# Patient Record
Sex: Female | Born: 1935
Health system: Southern US, Community
[De-identification: ages and names within clinical notes are randomized; demographics above are authoritative.]

## PROBLEM LIST (undated history)

## (undated) DIAGNOSIS — D649 Anemia, unspecified: Secondary | ICD-10-CM

## (undated) DIAGNOSIS — I739 Peripheral vascular disease, unspecified: Secondary | ICD-10-CM

## (undated) DIAGNOSIS — E785 Hyperlipidemia, unspecified: Secondary | ICD-10-CM

## (undated) DIAGNOSIS — I2510 Atherosclerotic heart disease of native coronary artery without angina pectoris: Secondary | ICD-10-CM

## (undated) DIAGNOSIS — R413 Other amnesia: Secondary | ICD-10-CM

## (undated) DIAGNOSIS — K219 Gastro-esophageal reflux disease without esophagitis: Secondary | ICD-10-CM

## (undated) DIAGNOSIS — E119 Type 2 diabetes mellitus without complications: Secondary | ICD-10-CM

## (undated) DIAGNOSIS — K5790 Diverticulosis of intestine, part unspecified, without perforation or abscess without bleeding: Secondary | ICD-10-CM

## (undated) DIAGNOSIS — Z531 Procedure and treatment not carried out because of patient's decision for reasons of belief and group pressure: Secondary | ICD-10-CM

## (undated) DIAGNOSIS — I5032 Chronic diastolic (congestive) heart failure: Secondary | ICD-10-CM

## (undated) DIAGNOSIS — E039 Hypothyroidism, unspecified: Secondary | ICD-10-CM

## (undated) DIAGNOSIS — I10 Essential (primary) hypertension: Secondary | ICD-10-CM

## (undated) DIAGNOSIS — K635 Polyp of colon: Secondary | ICD-10-CM

## (undated) DIAGNOSIS — Z8719 Personal history of other diseases of the digestive system: Secondary | ICD-10-CM

## (undated) HISTORY — DX: Hyperlipidemia, unspecified: E78.5

## (undated) HISTORY — DX: Atherosclerotic heart disease of native coronary artery without angina pectoris: I25.10

## (undated) HISTORY — PX: CORONARY ANGIOPLASTY WITH STENT PLACEMENT: SHX49

## (undated) HISTORY — PX: VAGINAL HYSTERECTOMY: SUR661

## (undated) HISTORY — DX: Anemia, unspecified: D64.9

## (undated) HISTORY — DX: Diverticulosis of intestine, part unspecified, without perforation or abscess without bleeding: K57.90

## (undated) HISTORY — DX: Personal history of other diseases of the digestive system: Z87.19

## (undated) HISTORY — DX: Polyp of colon: K63.5

## (undated) HISTORY — DX: Essential (primary) hypertension: I10

## (undated) HISTORY — DX: Other amnesia: R41.3

## (undated) HISTORY — PX: TONSILLECTOMY: SUR1361

---

## 1998-03-14 ENCOUNTER — Other Ambulatory Visit: Admission: RE | Admit: 1998-03-14 | Discharge: 1998-03-14 | Payer: Self-pay | Admitting: Gastroenterology

## 2005-05-25 ENCOUNTER — Other Ambulatory Visit: Payer: Self-pay

## 2005-05-25 ENCOUNTER — Emergency Department: Payer: Self-pay | Admitting: Emergency Medicine

## 2005-07-23 ENCOUNTER — Encounter: Admission: RE | Admit: 2005-07-23 | Discharge: 2005-07-23 | Payer: Self-pay | Admitting: Family Medicine

## 2007-12-17 ENCOUNTER — Ambulatory Visit: Payer: Self-pay | Admitting: Family Medicine

## 2011-03-11 ENCOUNTER — Other Ambulatory Visit: Payer: Self-pay | Admitting: Family Medicine

## 2011-03-11 ENCOUNTER — Ambulatory Visit
Admission: RE | Admit: 2011-03-11 | Discharge: 2011-03-11 | Disposition: A | Discharge status: Home / Self Care | Payer: 59 | Source: Ambulatory Visit | Attending: Family Medicine | Admitting: Family Medicine

## 2011-03-11 DIAGNOSIS — M79609 Pain in unspecified limb: Secondary | ICD-10-CM

## 2011-04-15 DIAGNOSIS — K635 Polyp of colon: Secondary | ICD-10-CM

## 2011-04-15 HISTORY — DX: Polyp of colon: K63.5

## 2011-05-26 ENCOUNTER — Observation Stay (HOSPITAL_COMMUNITY)
Admission: EM | Admit: 2011-05-26 | Discharge: 2011-05-27 | Disposition: A | Discharge status: Home / Self Care | Payer: Medicare Other | Attending: Internal Medicine | Admitting: Internal Medicine

## 2011-05-26 ENCOUNTER — Other Ambulatory Visit: Payer: Self-pay

## 2011-05-26 ENCOUNTER — Observation Stay (HOSPITAL_COMMUNITY): Payer: Medicare Other

## 2011-05-26 ENCOUNTER — Encounter (HOSPITAL_COMMUNITY): Payer: Self-pay | Admitting: Nurse Practitioner

## 2011-05-26 ENCOUNTER — Emergency Department (HOSPITAL_COMMUNITY): Payer: Medicare Other

## 2011-05-26 DIAGNOSIS — R9431 Abnormal electrocardiogram [ECG] [EKG]: Secondary | ICD-10-CM

## 2011-05-26 DIAGNOSIS — R0602 Shortness of breath: Secondary | ICD-10-CM | POA: Insufficient documentation

## 2011-05-26 DIAGNOSIS — R0609 Other forms of dyspnea: Secondary | ICD-10-CM | POA: Insufficient documentation

## 2011-05-26 DIAGNOSIS — I509 Heart failure, unspecified: Secondary | ICD-10-CM | POA: Insufficient documentation

## 2011-05-26 DIAGNOSIS — R0989 Other specified symptoms and signs involving the circulatory and respiratory systems: Secondary | ICD-10-CM | POA: Insufficient documentation

## 2011-05-26 DIAGNOSIS — R079 Chest pain, unspecified: Principal | ICD-10-CM | POA: Insufficient documentation

## 2011-05-26 DIAGNOSIS — E039 Hypothyroidism, unspecified: Secondary | ICD-10-CM | POA: Diagnosis present

## 2011-05-26 DIAGNOSIS — E119 Type 2 diabetes mellitus without complications: Secondary | ICD-10-CM | POA: Diagnosis present

## 2011-05-26 HISTORY — DX: Hypothyroidism, unspecified: E03.9

## 2011-05-26 LAB — CK TOTAL AND CKMB (NOT AT ARMC): CK, MB: 3.4 ng/mL (ref 0.3–4.0)

## 2011-05-26 LAB — CBC
HCT: 40.1 % (ref 36.0–46.0)
Hemoglobin: 13.1 g/dL (ref 12.0–15.0)
Hemoglobin: 12.4 g/dL (ref 12.0–15.0)
MCHC: 32.7 g/dL (ref 30.0–36.0)
RBC: 4.73 MIL/uL (ref 3.87–5.11)
RDW: 14.2 % (ref 11.5–15.5)
WBC: 6.5 10*3/uL (ref 4.0–10.5)

## 2011-05-26 LAB — PROTIME-INR
INR: 0.97 (ref 0.00–1.49)
Prothrombin Time: 13.1 seconds (ref 11.6–15.2)

## 2011-05-26 LAB — BASIC METABOLIC PANEL
BUN: 9 mg/dL (ref 6–23)
CO2: 21 mEq/L (ref 19–32)
Calcium: 9.6 mg/dL (ref 8.4–10.5)
Chloride: 106 mEq/L (ref 96–112)
Creatinine, Ser: 0.59 mg/dL (ref 0.50–1.10)
GFR calc Af Amer: >90 mL/min (ref >90)
GFR calc non Af Amer: 89 mL/min — ABNORMAL LOW (ref >90)

## 2011-05-26 LAB — CREATININE, SERUM
Creatinine, Ser: 0.84 mg/dL (ref 0.50–1.10)
GFR calc non Af Amer: 67 mL/min — ABNORMAL LOW (ref >90)

## 2011-05-26 LAB — PRO B NATRIURETIC PEPTIDE: Pro B Natriuretic peptide (BNP): 611.5 pg/mL — ABNORMAL HIGH (ref 0–125)

## 2011-05-26 MED ORDER — SODIUM CHLORIDE 0.9 % IV SOLN: 250.0000 mL | INTRAVENOUS | Status: DC | PRN

## 2011-05-26 MED ORDER — NITROGLYCERIN 0.4 MG SL SUBL: 0.4000 mg | SUBLINGUAL_TABLET | SUBLINGUAL | Status: DC | PRN

## 2011-05-26 MED ORDER — ASPIRIN EC 81 MG PO TBEC
81.0000 mg | DELAYED_RELEASE_TABLET | Freq: Every day | ORAL | Status: DC
  Administered 2011-05-27: 81 mg via ORAL
  Filled 2011-05-26 (×2): qty 1

## 2011-05-26 MED ORDER — POLYETHYLENE GLYCOL 3350 17 G PO PACK
17.0000 g | PACK | Freq: Every day | ORAL | Status: DC | PRN
  Filled 2011-05-26: qty 1

## 2011-05-26 MED ORDER — ASPIRIN 81 MG PO CHEW
324.0000 mg | CHEWABLE_TABLET | Freq: Once | ORAL | Status: AC
  Administered 2011-05-26: 324 mg via ORAL
  Filled 2011-05-26: qty 4

## 2011-05-26 MED ORDER — SIMVASTATIN 10 MG PO TABS
10.0000 mg | ORAL_TABLET | Freq: Every day | ORAL | Status: DC
  Filled 2011-05-26: qty 1

## 2011-05-26 MED ORDER — METFORMIN HCL ER 500 MG PO TB24
500.0000 mg | ORAL_TABLET | Freq: Two times a day (BID) | ORAL | Status: DC
  Administered 2011-05-27: 500 mg via ORAL
  Filled 2011-05-26 (×2): qty 1

## 2011-05-26 MED ORDER — LEVOTHYROXINE SODIUM 50 MCG PO TABS
50.0000 ug | ORAL_TABLET | Freq: Every day | ORAL | Status: DC
  Administered 2011-05-27: 50 ug via ORAL
  Filled 2011-05-26 (×2): qty 1

## 2011-05-26 MED ORDER — ALBUTEROL SULFATE (5 MG/ML) 0.5% IN NEBU: 2.5000 mg | INHALATION_SOLUTION | RESPIRATORY_TRACT | Status: DC | PRN

## 2011-05-26 MED ORDER — FUROSEMIDE 10 MG/ML IJ SOLN
40.0000 mg | Freq: Once | INTRAMUSCULAR | Status: AC
  Administered 2011-05-26: 40 mg via INTRAVENOUS
  Filled 2011-05-26 (×2): qty 4

## 2011-05-26 MED ORDER — SODIUM CHLORIDE 0.9 % IJ SOLN
3.0000 mL | Freq: Two times a day (BID) | INTRAMUSCULAR | Status: DC
  Administered 2011-05-26: 3 mL via INTRAVENOUS

## 2011-05-26 MED ORDER — SODIUM CHLORIDE 0.9 % IJ SOLN: 3.0000 mL | INTRAMUSCULAR | Status: DC | PRN

## 2011-05-26 MED ORDER — ENOXAPARIN SODIUM 40 MG/0.4ML ~~LOC~~ SOLN
40.0000 mg | SUBCUTANEOUS | Status: DC
  Administered 2011-05-26: 40 mg via SUBCUTANEOUS
  Filled 2011-05-26 (×2): qty 0.4

## 2011-05-26 MED ORDER — ACETAMINOPHEN 325 MG PO TABS: 650.0000 mg | ORAL_TABLET | Freq: Four times a day (QID) | ORAL | Status: DC | PRN

## 2011-05-26 MED ORDER — HYDROCODONE-ACETAMINOPHEN 5-325 MG PO TABS: 1.0000 | ORAL_TABLET | ORAL | Status: DC | PRN

## 2011-05-26 MED ORDER — ACETAMINOPHEN 650 MG RE SUPP: 650.0000 mg | Freq: Four times a day (QID) | RECTAL | Status: DC | PRN

## 2011-05-26 MED ORDER — GLIMEPIRIDE 4 MG PO TABS
6.0000 mg | ORAL_TABLET | Freq: Every day | ORAL | Status: DC
  Administered 2011-05-27: 6 mg via ORAL
  Filled 2011-05-26 (×2): qty 1

## 2011-05-26 NOTE — H&P (Signed)
PCP:   Ailene Ravel, MD, MD   Chief Complaint:  Chest pressure  HPI: 76 year old woman with history of diabetes and hypothyroidism, presented to the emergency room with complaints of chest pressure, as well as dyspnea on exertion and difficulty climbing stairs. She does not have any cardiac history and recalls that years ago she had a stress test was fine. Currently she feels okay but reports that at night she is having difficulty when she is trying to stay flat in bed on her back.   Review of Systems:  The patient has lost 30 pounds over the past 6 months and has gained back 5 pounds over the past week The patient denies anorexia, fever,  vision loss, decreased hearing, hoarseness, syncope, dyspnea on exertion, , balance deficits, hemoptysis, abdominal pain, melena, hematochezia, severe indigestion/heartburn, hematuria, incontinence, genital sores, muscle weakness, suspicious skin lesions, transient blindness, difficulty walking, depression, unusual weight change, abnormal bleeding,  Past Medical History: Past Medical History  Diagnosis Date  . Diabetes mellitus   . Hypothyroid    Past Surgical History  Procedure Date  . Abdominal hysterectomy     Medications: Prior to Admission medications   Medication Sig Start Date End Date Taking? Authorizing Provider  glimepiride (AMARYL) 4 MG tablet Take 6 mg by mouth daily before breakfast.   Yes Historical Provider, MD  losartan (COZAAR) 50 MG tablet Take 50 mg by mouth daily.   Yes Historical Provider, MD  metFORMIN (GLUCOPHAGE-XR) 500 MG 24 hr tablet Take 500 mg by mouth 2 (two) times daily.   Yes Historical Provider, MD  nitroGLYCERIN (NITROSTAT) 0.4 MG SL tablet Place 0.4 mg under the tongue every 5 (five) minutes as needed. For chest pain   Yes Historical Provider, MD  polyethylene glycol (MIRALAX / GLYCOLAX) packet Take 17 g by mouth daily as needed.   Yes Historical Provider, MD  pravastatin (PRAVACHOL) 40 MG tablet Take 40 mg by  mouth at bedtime.   Yes Historical Provider, MD  spironolactone (ALDACTONE) 50 MG tablet Take 25 mg by mouth daily.   Yes Historical Provider, MD    Allergies:   Allergies  Allergen Reactions  . Tetanus Toxoids     Social History:  reports that she quit smoking about 43 years ago. She has never used smokeless tobacco. She reports that she drinks about 1.2 ounces of alcohol per week. She reports that she does not use illicit drugs.  History   Social History Narrative  . No narrative on file     Family History: History reviewed. No pertinent family history.  Physical Exam: Filed Vitals:   05/26/11 1337 05/26/11 1529 05/26/11 1832 05/26/11 1900  BP: 129/60 125/86 143/63 141/77  Pulse: 68 56 72 65  Temp: 97.6 F (36.4 C)   97.9 F (36.6 C)  TempSrc: Oral   Oral  Resp: 16 14 17 18   Height: 5\' 4"  (1.626 m)     Weight: 76.204 kg (168 lb)     SpO2: 96% 100% 99% 99%   General appearance: alert, cooperative, appears stated age and mild distress Head: Normocephalic, without obvious abnormality, atraumatic Eyes: conjunctivae/corneas clear. PERRL, EOM's intact. Fundi benign. Nose: Nares normal. Septum midline. Mucosa normal. No drainage or sinus tenderness. Throat: lips, mucosa, and tongue normal; teeth and gums normal Back: symmetric, no curvature. ROM normal. No CVA tenderness. Resp: rales bilaterally Cardio: regular rate and rhythm, S1, S2 normal, no murmur, click, rub or gallop GI: soft, non-tender; bowel sounds normal; no masses,  no organomegaly  Extremities: extremities normal, atraumatic, no cyanosis or edema Pulses: 2+ and symmetric Skin: Skin color, texture, turgor normal. No rashes or lesions Neurologic: Grossly normal   Labs on Admission:   [facility] 05/26/11 1518  NA 140  K 4.1  CL 106  CO2 21  GLUCOSE 142*  BUN 9  CREATININE 0.59  CALCIUM 9.6  MG --  PHOS --    Basename 05/26/11 1518  WBC 7.0  NEUTROABS --  HGB 13.1  HCT 40.1  MCV 80.5  PLT 264     Basename 05/26/11 1520  CKTOTAL 62  CKMB 3.4  CKMBINDEX --  TROPONINI --   No results found for this basename: TSH,T4TOTAL,FREET3,T3FREE,THYROIDAB in the last 72 hours No results found for this basename: VITAMINB12:2,FOLATE:2,FERRITIN:2,TIBC:2,IRON:2,RETICCTPCT:2 in the last 72 hours  Radiological Exams on Admission: Dg Chest 2 View  05/26/2011  *RADIOLOGY REPORT*  Clinical Data: Chest heaviness, exhaustion  CHEST - 2 VIEW  Comparison: None  Findings: Minimal enlargement of cardiac silhouette. Mediastinal contours and pulmonary vascularity normal. Lungs appear slightly emphysematous with minimal subsegmental atelectasis or scarring at right base. Lungs otherwise clear. No pleural effusion or pneumothorax. Bones demineralized with significant levoconvex cervicothoracic scoliosis. Scattered end plate spur formation thoracic spine.  IMPRESSION: Minimal enlargement of cardiac silhouette. Emphysematous changes with minimal subsegmental atelectasis or scarring at right base.  Original Report Authenticated By: Lollie Marrow, M.D.    Assessment/Plan  76 year old woman admitted for chest pressure and congestive heart failure type symptoms.  Plan is to observe her overnight on telemetry, cycle cardiac enzymes, obtain echocardiogram. We will check a pro BNP level. She will be treated with intravenous furosemide to see if her symptoms improve. If the echocardiogram is within normal limits and shows no arrhythmia or abnormal cardiac enzymes she did could be reffered for outpatient stress testing. If echocardiogram is abnormal she will need inpatient cardiology consultation. We will continue the diabetic medication and the Synthroid without change, and we will check a TSH level. Present on Admission:  .Hypothyroid .DM type 2 (diabetes mellitus, type 2) .CHF (congestive heart failure)  Pamila Mendibles 05/26/2011, 9:36 PM

## 2011-05-26 NOTE — ED Provider Notes (Signed)
History     CSN: [id number]  Arrival date & time 05/26/11  1328   First MD Initiated Contact with Patient 05/26/11 1506      Chief Complaint  Patient presents with  . Abnormal ECG    (Consider location/radiation/quality/duration/timing/severity/associated sxs/prior treatment) HPI Comments: The patient is a 76 year old female with a history of diabetes mellitus type 2, dyslipidemia, and hypertension, without any known coronary artery disease or myocardial infarction in the past, who is sent in by her primary care physician at [facility] for evaluation after the patient had presented there for symptoms of chest discomfort and an ECG showed what her primary care office is reporting are new changes with T-wave inversions in leads V1 through V6. The patient reports that over the last 2 weeks she has had intermittent episodes of chest "heaviness" with radiation to the left arm, with shortness of breath, dyspnea on exertion, but no nausea or diaphoresis. The episodes typically happen at rest in the evening when she lays down for bed, but also appear on exertion such as when she is climbing stairs but she states she normally has no problems with. The patient denies fever, chills, cough. She rates the pain severity to be moderate at worst, worse with exertion, improved with rest and aspirin, and she reports 0/10 pain at this time with no dyspnea. She is in no acute distress.  The history is provided by the patient and medical records.    Past Medical History  Diagnosis Date  . Diabetes mellitus   . Hypothyroid     Past Surgical History  Procedure Date  . Abdominal hysterectomy     History reviewed. No pertinent family history.  History  Substance Use Topics  . Smoking status: Former Smoker    Quit date: 04/14/1968  . Smokeless tobacco: Never Used  . Alcohol Use: 1.2 oz/week    2 Glasses of wine per week    OB History    Grav Para Term Preterm Abortions TAB SAB Ect  Mult Living                  Review of Systems  Constitutional: Positive for fatigue. Negative for fever, chills, diaphoresis, activity change and appetite change.  HENT: Negative for congestion, facial swelling, rhinorrhea, neck pain, neck stiffness and postnasal drip.   Eyes: Negative.   Respiratory: Positive for shortness of breath. Negative for cough, choking and chest tightness.        Dyspnea on exertion  Cardiovascular: Positive for chest pain. Negative for palpitations and leg swelling.  Gastrointestinal: Negative for nausea, vomiting, abdominal pain and abdominal distention.  Musculoskeletal: Negative.   Skin: Negative.   Neurological: Negative for dizziness, syncope, speech difficulty, weakness, light-headedness, numbness and headaches.  Hematological: Does not bruise/bleed easily.  Psychiatric/Behavioral: Negative.     Allergies  Tetanus toxoids  Home Medications   Current Outpatient Rx  Name Route Sig Dispense Refill  . GLIMEPIRIDE 4 MG PO TABS Oral Take 6 mg by mouth daily before breakfast.    . LOSARTAN POTASSIUM 50 MG PO TABS Oral Take 50 mg by mouth daily.    [name] METFORMIN HCL ER 500 MG PO TB24 Oral Take 500 mg by mouth 2 (two) times daily.    [name] NITROGLYCERIN 0.4 MG SL SUBL Sublingual Place 0.4 mg under the tongue every 5 (five) minutes as needed. For chest pain    . POLYETHYLENE GLYCOL 3350 PO PACK Oral Take 17 g by mouth daily as needed.    [name]  PRAVASTATIN SODIUM 40 MG PO TABS Oral Take 40 mg by mouth at bedtime.    . SPIRONOLACTONE 50 MG PO TABS Oral Take 25 mg by mouth daily.      BP 125/86  Pulse 56  Temp(Src) 97.6 F (36.4 C) (Oral)  Resp 14  Ht 5\' 4"  (1.626 m)  Wt 168 lb (76.204 kg)  BMI 28.84 kg/m2  SpO2 100%  Physical Exam  Nursing note and vitals reviewed. Constitutional: She is oriented to person, place, and time. She appears well-developed and well-nourished. No distress.  HENT:  Head: Normocephalic and atraumatic.  Mouth/Throat: Oropharynx  is clear and moist.  Eyes: EOM are normal. Pupils are equal, round, and reactive to light.  Neck: Normal range of motion. Neck supple. No JVD present. No tracheal deviation present.  Cardiovascular: Normal rate, regular rhythm, normal heart sounds and intact distal pulses.   No extrasystoles are present. Exam reveals no gallop, no S3, no S4 and no friction rub.   No murmur heard. Pulmonary/Chest: Breath sounds normal. No accessory muscle usage or stridor. Not tachypneic. No respiratory distress. She has no wheezes. She has no rales. She exhibits no tenderness.  Abdominal: Soft. Bowel sounds are normal. She exhibits no distension. There is no tenderness.  Musculoskeletal: Normal range of motion. She exhibits no edema and no tenderness.  Neurological: She is alert and oriented to person, place, and time. She has normal reflexes. No cranial nerve deficit. Coordination normal.  Skin: Skin is warm and dry. No rash noted. She is not diaphoretic. No erythema. No pallor.  Psychiatric: She has a normal mood and affect. Her behavior is normal. Judgment and thought content normal.    ED Course  Procedures (including critical care time)  Date: 05/26/2011  Rate: 70  Rhythm: normal sinus rhythm  QRS Axis: normal  Intervals: QT prolonged  ST/T Wave abnormalities: T-wave inversions in leads 1, 2, 3, aVF, and V1 through V6  Conduction Disutrbances:none  Narrative Interpretation: T-wave inversions diffusely throughout the EKG reported to be new per the primary care provider. We have no old ECGs to compare this to.  Old EKG Reviewed: none available   Labs Reviewed  GLUCOSE, CAPILLARY - Abnormal; Notable for the following:    Glucose-Capillary 142 (*)    All other components within normal limits  CBC  BASIC METABOLIC PANEL  CK TOTAL AND CKMB  PROTIME-INR  APTT   Dg Chest 2 View  05/26/2011  *RADIOLOGY REPORT*  Clinical Data: Chest heaviness, exhaustion  CHEST - 2 VIEW  Comparison: None  Findings:  Minimal enlargement of cardiac silhouette. Mediastinal contours and pulmonary vascularity normal. Lungs appear slightly emphysematous with minimal subsegmental atelectasis or scarring at right base. Lungs otherwise clear. No pleural effusion or pneumothorax. Bones demineralized with significant levoconvex cervicothoracic scoliosis. Scattered end plate spur formation thoracic spine.  IMPRESSION: Minimal enlargement of cardiac silhouette. Emphysematous changes with minimal subsegmental atelectasis or scarring at right base.  Original Report Authenticated By: Lollie Marrow, M.D.     No diagnosis found.    MDM  ACS, MI, CAD, Musculoskeletal chest pain, costochondritis, GERD, Gastrointestinal Chest Pain, Pleuritic Chest Pain, Pneumonia, Pneumothorax, Pulmonary Embolism, Esophageal Spasm, Arrhythmia considered among other potential etiologies in the patient's differential diagnosis.  With concerning but somewhat atypical symptoms of chest pain, and with reported new ECG changes, without prior cardiac evaluation for same, the patient will ultimately need admission for further evaluation of this problem prior to discharge.       Veverly Fells  Fredricka Bonine, MD 05/26/11 [phone]

## 2011-05-26 NOTE — ED Notes (Signed)
Pt sent over from PCP after an abnormal EKG in their office today. C/o fatigue, dry cough, "heaviness" under L breast radiating down L arm over past week and noticed she was SOB during normal activities yesterday

## 2011-05-27 LAB — CARDIAC PANEL(CRET KIN+CKTOT+MB+TROPI)
Relative Index: INVALID (ref 0.0–2.5)
Troponin I: <0.3 ng/mL (ref <0.30)

## 2011-05-27 LAB — BASIC METABOLIC PANEL
GFR calc Af Amer: 75 mL/min — ABNORMAL LOW (ref >90)
GFR calc non Af Amer: 65 mL/min — ABNORMAL LOW (ref >90)
Potassium: 3.6 mEq/L (ref 3.5–5.1)
Sodium: 141 mEq/L (ref 135–145)

## 2011-05-27 LAB — GLUCOSE, CAPILLARY: Glucose-Capillary: 145 mg/dL — ABNORMAL HIGH (ref 70–99)

## 2011-05-27 LAB — TSH: TSH: 0.555 u[IU]/mL (ref 0.350–4.500)

## 2011-05-27 NOTE — Progress Notes (Signed)
Utilization Review Completed.[place] T2/03/2012   

## 2011-05-27 NOTE — Progress Notes (Signed)
Patient had a 5 beat run of V-tach, EKG obtained showed Normal Sinus Rhythm, T wave abnormality, prolonged QT. Patient was asymptomatic, vital signs T 98.1, HR 71, R 18, BP 126/75, 96 RA, Kirby NP notified, will continue to monitor.

## 2011-05-27 NOTE — Progress Notes (Signed)
Patient's iv catheter became dislodged and began to bleed profusely. Two 2x2 gauze were placed on her left hand, bleeding did cease, but patient has refused new iv placement until later on in the morning. Will page iv team, and continue to monitor.

## 2011-05-27 NOTE — Progress Notes (Signed)
*  PRELIMINARY RESULTS* Echocardiogram 2D Echocardiogram has been performed.  Glean Salen [facility] 05/27/2011, 10:10 AM

## 2011-05-27 NOTE — Discharge Summary (Signed)
Patient ID: Alyssa [name] MRN: [id number] DOB/AGE: 08/23/1935 76 y.o.  Admit date: 05/26/2011 Discharge date: 05/27/2011  Primary Care Physician:  Ailene Ravel, MD, MD  Discharge Diagnoses:    Present on Admission:  .Hypothyroid .DM type 2 (diabetes mellitus, type 2) .CHF (congestive heart failure)  Active Problems:  Hypothyroid  DM type 2 (diabetes mellitus, type 2)  CHF (congestive heart failure)   Medication List  As of 05/27/2011  7:59 AM   ASK your doctor about these medications         glimepiride 4 MG tablet   Commonly known as: AMARYL   Take 6 mg by mouth daily before breakfast.      losartan 50 MG tablet   Commonly known as: COZAAR   Take 50 mg by mouth daily.      metFORMIN 500 MG 24 hr tablet   Commonly known as: GLUCOPHAGE-XR   Take 500 mg by mouth 2 (two) times daily.      nitroGLYCERIN 0.4 MG SL tablet   Commonly known as: NITROSTAT   Place 0.4 mg under the tongue every 5 (five) minutes as needed. For chest pain      polyethylene glycol packet   Commonly known as: MIRALAX / GLYCOLAX   Take 17 g by mouth daily as needed.      pravastatin 40 MG tablet   Commonly known as: PRAVACHOL   Take 40 mg by mouth at bedtime.      spironolactone 50 MG tablet   Commonly known as: ALDACTONE   Take 25 mg by mouth daily.            Disposition and Follow-up:  - follow up with PCP in 1 week - SW - to set up appointment with CHF clinic  Consults:  none  Significant Diagnostic Studies:  No results found.  Brief H and P: 76 year old woman with history of diabetes and hypothyroidism, presented to the emergency room with complaints of chest pressure, as well as dyspnea on exertion and difficulty climbing stairs. She does not have any cardiac history and recalls that years ago she had a stress test was fine. Patient reported chest pain over left side of the chest, 7/10 in intensity, started initially ar rest. Patient noticed she had shortness of breath while  climbing stairs. There were no alleviating factors. No associated palpitations, no fever or chills. No lightheadedness or loss of consciousness.     Physical Exam on Discharge:  Filed Vitals:   05/26/11 1832 05/26/11 1900 05/26/11 2100 05/27/11 0500  BP: 143/63 141/77 126/75 144/77  Pulse: 72 65 79 75  Temp:  97.9 F (36.6 C) 98.1 F (36.7 C) 97.4 F (36.3 C)  TempSrc:  Oral Oral Oral  Resp: 17 18 18 18   Height:      Weight:    74.208 kg (163 lb 9.6 oz)  SpO2: 99% 99% 96% 97%     Intake/Output Summary (Last 24 hours) at 05/27/11 0759 Last data filed at 05/27/11 0000  Gross per 24 hour  Intake    240 ml  Output      0 ml  Net    240 ml    General: Alert, awake, oriented x3, in no acute distress. HEENT: No bruits, no goiter. Heart: Regular rate and rhythm, without murmurs, rubs, gallops. Lungs: Clear to auscultation bilaterally. Abdomen: Soft, nontender, nondistended, positive bowel sounds. Extremities: No clubbing cyanosis or edema with positive pedal pulses. Neuro: Grossly intact, nonfocal.  CBC:  Component Value Date/Time   WBC 6.5 05/26/2011 2116   HGB 12.4 05/26/2011 2116   HCT 38.3 05/26/2011 2116   PLT 243 05/26/2011 2116   MCV 81.0 05/26/2011 2116    Basic Metabolic Panel:    Component Value Date/Time   NA 141 05/27/2011 0336   K 3.6 05/27/2011 0336   CL 104 05/27/2011 0336   CO2 28 05/27/2011 0336   BUN 12 05/27/2011 0336   CREATININE 0.87 05/27/2011 0336   GLUCOSE 139* 05/27/2011 0336   CALCIUM 9.6 05/27/2011 0336    Hospital Course:   Active Problems:   Chest pain - MI ruled out as 2 sets of cardiac enzymes so far are negative - 2 D ECHO to evaluate for CHF, BNP was slightly elevated at 600 - on CXR there is mild cardiomegaly so this may be class I heart failure - we will set up appointment with CHF clinic  Hypothyroid - TSH is within normal limits   DM type 2 (diabetes mellitus, type 2) - continue home medications   CHF (congestive heart  failure) - 2 D ECHO  DISPOSITION - patient is medically stable and clinically appears well with no complaints of chest pain - stable for discharge home today  Time spent on Discharge: Greater than 30 minutes   Signed: Erla Bacchi 05/27/2011, 7:59 AM

## 2011-05-27 NOTE — Progress Notes (Signed)
Patient refused new iv placement per iv team. Paged Craige Cotta, NP. Got an order to keep iv out. Patient may be discharged later today.

## 2011-05-27 NOTE — Discharge Instructions (Signed)
Chest Pain (Nonspecific) It is often hard to give a specific diagnosis for the cause of chest pain. There is always a chance that your pain could be related to something serious, such as a heart attack or a blood clot in the lungs. You need to follow up with your caregiver for further evaluation. CAUSES   Heartburn.   Pneumonia or bronchitis.   Anxiety and stress.   Inflammation around your heart (pericarditis) or lung (pleuritis or pleurisy).   A blood clot in the lung.   A collapsed lung (pneumothorax). It can develop suddenly on its own (spontaneous pneumothorax) or from injury (trauma) to the chest.  The chest wall is composed of bones, muscles, and cartilage. Any of these can be the source of the pain.  The bones can be bruised by injury.   The muscles or cartilage can be strained by coughing or overwork.   The cartilage can be affected by inflammation and become sore (costochondritis).  DIAGNOSIS  Lab tests or other studies, such as X-rays, an EKG, stress testing, or cardiac imaging, may be needed to find the cause of your pain.  TREATMENT   Treatment depends on what may be causing your chest pain. Treatment may include:   Acid blockers for heartburn.   Anti-inflammatory medicine.   Pain medicine for inflammatory conditions.   Antibiotics if an infection is present.   You may be advised to change lifestyle habits. This includes stopping smoking and avoiding caffeine and chocolate.   You may be advised to keep your head raised (elevated) when sleeping. This reduces the chance of acid going backward from your stomach into your esophagus.   Most of the time, nonspecific chest pain will improve within 2 to 3 days with rest and mild pain medicine.  HOME CARE INSTRUCTIONS   If antibiotics were prescribed, take the full amount even if you start to feel better.   For the next few days, avoid physical activities that bring on chest pain. Continue physical activities as  directed.   Do not smoke cigarettes or drink alcohol until your symptoms are gone.   Only take over-the-counter or prescription medicine for pain, discomfort, or fever as directed by your caregiver.   Follow your caregiver's suggestions for further testing if your chest pain does not go away.   Keep any follow-up appointments you made. If you do not go to an appointment, you could develop lasting (chronic) problems with pain. If there is any problem keeping an appointment, you must call to reschedule.  SEEK MEDICAL CARE IF:   You think you are having problems from the medicine you are taking. Read your medicine instructions carefully.   Your chest pain does not go away, even after treatment.   You develop a rash with blisters on your chest.  SEEK IMMEDIATE MEDICAL CARE IF:   You have increased chest pain or pain that spreads to your arm, neck, jaw, back, or belly (abdomen).   You develop shortness of breath, an increasing cough, or you are coughing up blood.   You have severe back or abdominal pain, feel sick to your stomach (nauseous) or throw up (vomit).   You develop severe weakness, fainting, or chills.   You have an oral temperature above 102 F (38.9 C), not controlled by medicine.  THIS IS AN EMERGENCY. Do not wait to see if the pain will go away. Get medical help at once. Call your local emergency services (911 in [place] Do not drive yourself to   the hospital. MAKE SURE YOU:   Understand these instructions.   Will watch your condition.   Will get help right away if you are not doing well or get worse.  Document Released: 01/08/2005 Document Revised: 12/11/2010 Document Reviewed: 11/04/2007 [employer] Patient Information 2012 [employer]. 

## 2011-06-09 ENCOUNTER — Telehealth (HOSPITAL_COMMUNITY): Payer: Self-pay | Admitting: *Deleted

## 2011-06-09 NOTE — Telephone Encounter (Signed)
Left message to call back  

## 2011-06-09 NOTE — Telephone Encounter (Signed)
Spoke w/pt she is continuing to have chest "heaviness" and feels like she is unable to catch her breathe, doesn't feel she should go to Er symptoms are off/on, this seems to have gotten worse since she was d/c'd from the hospital, she has been weighting herself daily and it is the same, slight edema in ankles offered pt an appt for tomorrow at 10:30 however she is unable to make this appt and is seeing her pcp tomorrow she will go from there and let me know what they say

## 2011-06-09 NOTE — Telephone Encounter (Signed)
Alyssa [name] called today.  She is a new pt set to come in on March 11th, however, she is experiencing a lot of chest pain and SOB and would like to be seen sooner if possible.  We have nothing available, however I wanted you to be aware of her situation and for you to follow up with her.  Thanks.

## 2011-06-10 NOTE — Telephone Encounter (Signed)
Spoke w/pt moved her appt up to thur 2/28 at 12

## 2011-06-11 ENCOUNTER — Encounter (HOSPITAL_COMMUNITY): Payer: 59

## 2011-06-12 ENCOUNTER — Inpatient Hospital Stay (HOSPITAL_COMMUNITY)
Admission: RE | Admit: 2011-06-12 | Discharge: 2011-06-14 | DRG: 247 | Disposition: A | Discharge status: Home / Self Care | Payer: Medicare Other | Source: Ambulatory Visit | Attending: Internal Medicine | Admitting: Internal Medicine

## 2011-06-12 ENCOUNTER — Ambulatory Visit (HOSPITAL_BASED_OUTPATIENT_CLINIC_OR_DEPARTMENT_OTHER)
Admission: RE | Admit: 2011-06-12 | Discharge: 2011-06-12 | Disposition: A | Discharge status: Home / Self Care | Payer: Medicare Other | Source: Ambulatory Visit | Attending: Internal Medicine | Admitting: Internal Medicine

## 2011-06-12 ENCOUNTER — Other Ambulatory Visit: Payer: Self-pay

## 2011-06-12 ENCOUNTER — Encounter (HOSPITAL_COMMUNITY): Payer: Self-pay | Admitting: *Deleted

## 2011-06-12 ENCOUNTER — Encounter (HOSPITAL_COMMUNITY)
Admission: RE | Disposition: A | Discharge status: Home / Self Care | Payer: Self-pay | Source: Ambulatory Visit | Attending: Internal Medicine

## 2011-06-12 DIAGNOSIS — R079 Chest pain, unspecified: Secondary | ICD-10-CM

## 2011-06-12 DIAGNOSIS — I2510 Atherosclerotic heart disease of native coronary artery without angina pectoris: Secondary | ICD-10-CM

## 2011-06-12 DIAGNOSIS — I214 Non-ST elevation (NSTEMI) myocardial infarction: Secondary | ICD-10-CM

## 2011-06-12 DIAGNOSIS — E039 Hypothyroidism, unspecified: Secondary | ICD-10-CM | POA: Diagnosis present

## 2011-06-12 DIAGNOSIS — E119 Type 2 diabetes mellitus without complications: Secondary | ICD-10-CM | POA: Diagnosis present

## 2011-06-12 DIAGNOSIS — Z79899 Other long term (current) drug therapy: Secondary | ICD-10-CM

## 2011-06-12 DIAGNOSIS — I509 Heart failure, unspecified: Secondary | ICD-10-CM

## 2011-06-12 DIAGNOSIS — Z7982 Long term (current) use of aspirin: Secondary | ICD-10-CM

## 2011-06-12 DIAGNOSIS — Z87891 Personal history of nicotine dependence: Secondary | ICD-10-CM

## 2011-06-12 HISTORY — PX: LEFT HEART CATHETERIZATION WITH CORONARY ANGIOGRAM: SHX5451

## 2011-06-12 LAB — CBC
MCHC: 33.2 g/dL (ref 30.0–36.0)
Platelets: 307 10*3/uL (ref 150–400)
RDW: 14.2 % (ref 11.5–15.5)

## 2011-06-12 LAB — GLUCOSE, CAPILLARY
Glucose-Capillary: 150 mg/dL — ABNORMAL HIGH (ref 70–99)
Glucose-Capillary: 118 mg/dL — ABNORMAL HIGH (ref 70–99)

## 2011-06-12 LAB — MRSA PCR SCREENING: MRSA by PCR: NEGATIVE

## 2011-06-12 LAB — BASIC METABOLIC PANEL
BUN: 11 mg/dL (ref 6–23)
Creatinine, Ser: 0.66 mg/dL (ref 0.50–1.10)
GFR calc Af Amer: >90 mL/min (ref >90)
GFR calc non Af Amer: 86 mL/min — ABNORMAL LOW (ref >90)
Potassium: 4.6 mEq/L (ref 3.5–5.1)

## 2011-06-12 LAB — PROTIME-INR
INR: 0.87 (ref 0.00–1.49)
Prothrombin Time: 12 seconds (ref 11.6–15.2)

## 2011-06-12 SURGERY — LEFT HEART CATHETERIZATION WITH CORONARY ANGIOGRAM
Anesthesia: LOCAL

## 2011-06-12 MED ORDER — SODIUM CHLORIDE 0.9 % IJ SOLN: 3.0000 mL | INTRAMUSCULAR | Status: DC | PRN

## 2011-06-12 MED ORDER — SIMVASTATIN 20 MG PO TABS
20.0000 mg | ORAL_TABLET | Freq: Every day | ORAL | Status: DC
  Administered 2011-06-12 – 2011-06-13 (×2): 20 mg via ORAL
  Filled 2011-06-12 (×3): qty 1

## 2011-06-12 MED ORDER — ASPIRIN 81 MG PO CHEW: 324.0000 mg | CHEWABLE_TABLET | ORAL | Status: DC

## 2011-06-12 MED ORDER — DIAZEPAM 5 MG PO TABS: 5.0000 mg | ORAL_TABLET | ORAL | Status: DC

## 2011-06-12 MED ORDER — ASPIRIN 81 MG PO CHEW
81.0000 mg | CHEWABLE_TABLET | Freq: Every day | ORAL | Status: DC
  Administered 2011-06-13 – 2011-06-14 (×2): 81 mg via ORAL
  Filled 2011-06-12 (×2): qty 1

## 2011-06-12 MED ORDER — MIDAZOLAM HCL 2 MG/2ML IJ SOLN
INTRAMUSCULAR | Status: AC
  Filled 2011-06-12: qty 2

## 2011-06-12 MED ORDER — GLIMEPIRIDE 4 MG PO TABS
6.0000 mg | ORAL_TABLET | Freq: Every day | ORAL | Status: DC
  Administered 2011-06-13 – 2011-06-14 (×2): 6 mg via ORAL
  Filled 2011-06-12 (×3): qty 1

## 2011-06-12 MED ORDER — TICAGRELOR 90 MG PO TABS
ORAL_TABLET | ORAL | Status: AC
  Filled 2011-06-12: qty 2

## 2011-06-12 MED ORDER — LOSARTAN POTASSIUM 50 MG PO TABS
50.0000 mg | ORAL_TABLET | Freq: Every day | ORAL | Status: DC
  Filled 2011-06-12 (×2): qty 1

## 2011-06-12 MED ORDER — SODIUM CHLORIDE 0.9 % IV SOLN: 250.0000 mL | INTRAVENOUS | Status: DC | PRN

## 2011-06-12 MED ORDER — NITROGLYCERIN 0.4 MG SL SUBL
0.4000 mg | SUBLINGUAL_TABLET | SUBLINGUAL | Status: DC | PRN
  Administered 2011-06-12: 0.4 mg via SUBLINGUAL
  Filled 2011-06-12: qty 25

## 2011-06-12 MED ORDER — SODIUM CHLORIDE 0.9 % IV SOLN
1.0000 mL/kg/h | INTRAVENOUS | Status: DC
  Administered 2011-06-12: 1 mL/kg/h via INTRAVENOUS

## 2011-06-12 MED ORDER — SPIRONOLACTONE 25 MG PO TABS
25.0000 mg | ORAL_TABLET | Freq: Every day | ORAL | Status: DC
  Administered 2011-06-13 – 2011-06-14 (×2): 25 mg via ORAL
  Filled 2011-06-12 (×3): qty 1

## 2011-06-12 MED ORDER — HEPARIN (PORCINE) IN NACL 2-0.9 UNIT/ML-% IJ SOLN
INTRAMUSCULAR | Status: AC
  Filled 2011-06-12: qty 2000

## 2011-06-12 MED ORDER — POLYETHYLENE GLYCOL 3350 17 G PO PACK
17.0000 g | PACK | Freq: Every day | ORAL | Status: DC | PRN
  Filled 2011-06-12: qty 1

## 2011-06-12 MED ORDER — LIDOCAINE HCL (PF) 1 % IJ SOLN
INTRAMUSCULAR | Status: AC
  Filled 2011-06-12: qty 30

## 2011-06-12 MED ORDER — ACETAMINOPHEN 325 MG PO TABS: 650.0000 mg | ORAL_TABLET | ORAL | Status: DC | PRN

## 2011-06-12 MED ORDER — SODIUM CHLORIDE 0.9 % IV SOLN: 1.0000 mL/kg/h | INTRAVENOUS | Status: AC

## 2011-06-12 MED ORDER — LEVOTHYROXINE SODIUM 25 MCG PO TABS
25.0000 ug | ORAL_TABLET | Freq: Every day | ORAL | Status: DC
  Administered 2011-06-13 – 2011-06-14 (×2): 25 ug via ORAL
  Filled 2011-06-12 (×3): qty 1

## 2011-06-12 MED ORDER — NITROGLYCERIN 0.2 MG/ML ON CALL CATH LAB
INTRAVENOUS | Status: AC
  Filled 2011-06-12: qty 1

## 2011-06-12 MED ORDER — BIVALIRUDIN 250 MG IV SOLR
INTRAVENOUS | Status: AC
  Filled 2011-06-12: qty 250

## 2011-06-12 MED ORDER — FENTANYL CITRATE 0.05 MG/ML IJ SOLN
INTRAMUSCULAR | Status: AC
  Filled 2011-06-12: qty 2

## 2011-06-12 MED ORDER — NITROGLYCERIN 0.4 MG SL SUBL: 0.4000 mg | SUBLINGUAL_TABLET | SUBLINGUAL | Status: DC | PRN

## 2011-06-12 MED ORDER — ONDANSETRON HCL 4 MG/2ML IJ SOLN: 4.0000 mg | Freq: Four times a day (QID) | INTRAMUSCULAR | Status: DC | PRN

## 2011-06-12 MED ORDER — SODIUM CHLORIDE 0.9 % IJ SOLN
3.0000 mL | Freq: Two times a day (BID) | INTRAMUSCULAR | Status: DC
  Administered 2011-06-12 – 2011-06-14 (×4): 3 mL via INTRAVENOUS

## 2011-06-12 MED ORDER — SODIUM CHLORIDE 0.9 % IJ SOLN: 3.0000 mL | Freq: Two times a day (BID) | INTRAMUSCULAR | Status: DC

## 2011-06-12 MED ORDER — SODIUM CHLORIDE 0.9 % IV SOLN: 250.0000 mL | INTRAVENOUS | Status: DC

## 2011-06-12 MED ORDER — SODIUM CHLORIDE 0.9 % IV SOLN: INTRAVENOUS | Status: DC

## 2011-06-12 NOTE — CV Procedure (Signed)
   CARDIAC CATH NOTE  Name: Alyssa [name] MRN: [id number] DOB: 1935-11-03  Procedure:  Aspiration thrombectomy and stenting of the LAD, Perclose of the right femoral artery.  Indication: Non-STEMI with ongoing chest pain.  Procedural Details: This is a 76 year old woman who presented with ongoing chest discomfort. Her EKG was suggestive of recent anterior infarction. There was nondiagnostic ST segment elevation, but the patient clearly had acute coronary syndrome. She was brought fairly urgently for cardiac catheterization which was performed by Dr. Eden Emms. This demonstrated severe thrombotic stenosis in the proximal LAD with TIMI 2 flow. The patient also was noted to have moderately severe left circumflex stenosis. The right coronary artery was diffusely diseased without tight stenoses. We elected to proceed with PCI of the LAD which was clearly the patient's culprit vessel. A 6 French sheath was placed in the right femoral artery. Weight-based bivalirudin was given for anticoagulation. The patient was preloaded with 180 mg of Brilinta. Once a therapeutic ACT was achieved, a 6 [place] XB LAD 3.5 cm guide catheter was inserted.  A cougar coronary guidewire was used to cross the lesion. Aspiration thrombectomy was performed. The lesion was very long and across the first diagonal branch. The lesion was then stented with a 2.5 x 38 mm Promus element drug-eluting stent.  The stent was postdilated with a 2.75 x 20 mm noncompliant balloon to a maximum pressure 18 atmospheres.  Following PCI, there was 0% residual stenosis and TIMI-3 flow. The small diagonal branch was jailed but had TIMI 3 flow and the patient was chest pain-free. Final angiography confirmed an excellent result. The patient tolerated the procedure well. There were no immediate procedural complications. Femoral hemostasis was achieved with a Perclose device. The patient was transferred to the post catheterization recovery area for further  monitoring.  Lesion Data: Vessel: LAD, proximal Percent stenosis (pre): 99 TIMI-flow (pre):  2 Stent:  2.5 x 38 mm Promus element drug-eluting Percent stenosis (post): 0 TIMI-flow (post): 3  Conclusions: Successful PCI of the proximal LAD utilizing a drug-eluting stent  Recommendations: Aspirin and Brilinta for a minimum of 12 months without interruption. Medical therapy for the patient's residual CAD with consideration of outpatient stress testing if recurrent angina.  Tonny Bollman 06/12/2011, 3:30 PM

## 2011-06-12 NOTE — Progress Notes (Signed)
Pt refuses to have her BP taken hourly. Her BP and all vitals have been stable. Will continue to monitor her BP prn.

## 2011-06-12 NOTE — Interval H&P Note (Signed)
History and Physical Interval Note:  06/12/2011 2:17 PM  Alyssa [name]  has presented today for surgery, with the diagnosis of Chest pain  The various methods of treatment have been discussed with the patient and family. After consideration of risks, benefits and other options for treatment, the patient has consented to  Procedure(s) (LRB): LEFT HEART CATHETERIZATION WITH CORONARY ANGIOGRAM (N/A) as a surgical intervention .  The patients' history has been reviewed, patient examined, no change in status, stable for surgery.  I have reviewed the patients' chart and labs.  Questions were answered to the patient's satisfaction.     [name]  See admission note filed under progress note from Dr Teressa Lower.  Likely subacute anterior MI  Sent from office through short stay for cath Risks discussed willing to proceed [name] 2:18 PM

## 2011-06-12 NOTE — Op Note (Signed)
   Cardiac Catheterization Procedure Note  Name: ILO BEAMON MRN: [id number] DOB: 01/18/1936  Procedure: Left Heart Cath, Selective Coronary Angiography, LV angiography  Indication:  Subacute MI   Procedural details: The right groin was prepped, draped, and anesthetized with 1% lidocaine. Using modified Seldinger technique, a 5 French sheath was introduced into the right femoral artery. Standard Judkins catheters were used for coronary angiography and left ventriculography. Catheter exchanges were performed over a guidewire. There were no immediate procedural complications. The patient was transferred to the post catheterization recovery area for further monitoring.  Procedural Findings: Hemodynamics:  AO  117/58 LV  135/9 EDP 13   Coronary angiography: Coronary dominance: right  Left mainstem: Normal  Left anterior descending (LAD): Thrombus with 99% lesion and TIMI 2 flow at take off of first septal perforator  IM: normal  D1: 70% ostial at LAD lesion  Left circumflex (LCx):  30% proximal and mid  OM1: 70-80% mid lesion large vessel  RCA:  Dominant.  40% eccentric proximal  40% distal  Some collaterals to LAD   Left ventriculography: Apical dsykinesis with no thrombus  EF 45-50%   Final Conclusions:  Culprit lesion is LAD with thrombus and TIMI 2 flow.  With ongoing pain.  Stent LAD with Dr Ulanda Edison and Angiomax.  Staged intervention to OM  Recommendations: Multivessel angioplasty  [name] 06/12/2011, 2:37 PM

## 2011-06-12 NOTE — Progress Notes (Signed)
Referring Physician: Dr. Elisabeth Pigeon Primary Care: Dr. Nathanial Rancher Primary Cardiologist: none   HPI: [name] is a 76 y.o. Jehovah Witness with past medical history pertinent for DM2 and hypothyroidism.    Denies any h/o heart disease. Had routine stress test years ago which was normal.  She was admitted for overnight observation on 2/11 due to chest pressure and dyspnea on exertion.  She ruled out for myocardial infarction but EKG revealed septal Q waves with inferolateral T wave inversions but no acute abnormalities.  Echo on 05/27/11 showed LVEF 55-60%, grade 1 diastolic dysfunction.   She presents to the HF clinic today for further evaluation.  She drove back from [place] yesterday due to progressive chest pain.  She is having pressure in her chest that radiates down both arms with dyspnea.  This has awoken her from sleep, last episode last night.  This resolved with her husband's nitroglycerin.  This pain also comes with walking up the stairs.  She currently has no chest pain.  Her dyspnea and cough have been progressive over the last several months.  She denies fever/chills.  Vital signs stable.      Review of Systems: [y] = yes, [ ]  = no   General: Weight gain [ ] ; Weight loss [ ] ; Anorexia [ ] ; Fatigue [ ] ; Fever [ ] ; Chills [ ] ; Weakness [ ]   Cardiac: Chest pain/pressure Cove.Etienne ]; Resting SOB y]; Exertional SOB Cove.Etienne ]; Orthopnea Cove.Etienne ]; Pedal Edema [ ] ; Palpitations [ ] ; Syncope [ ] ; Presyncope [ ] ; Paroxysmal nocturnal dyspnea[ ]   Pulmonary: Cough [ y]; Wheezing[ ] ; Hemoptysis[ ] ; Sputum [ ] ; Snoring [ ]   GI: Vomiting[ ] ; Dysphagia[ ] ; Melena[ ] ; Hematochezia [ ] ; Heartburn[ ] ; Abdominal pain [ ] ; Constipation [ ] ; Diarrhea [ ] ; BRBPR [ ]   GU: Hematuria[ ] ; Dysuria [ ] ; Nocturia[ ]   Vascular: Pain in legs with walking [ ] ; Pain in feet with lying flat [ ] ; Non-healing sores [ ] ; Stroke [ ] ; TIA [ ] ; Slurred speech [ ] ;  Neuro: Headaches[ ] ; Vertigo[ ] ; Seizures[ ] ; Paresthesias[ ] ;Blurred vision [  ]; Diplopia [ ] ; Vision changes [ ]   Ortho/Skin: Arthritis [ ] ; Joint pain [ ] ; Muscle pain [ ] ; Joint swelling [ ] ; Back Pain [ ] ; Rash [ ]   Psych: Depression[ ] ; Anxiety[ ]   Heme: Bleeding problems [ ] ; Clotting disorders [ ] ; Anemia [ ]   Endocrine: Diabetes Cove.Etienne ]; Thyroid dysfunction[y ]   Past Medical History  Diagnosis Date  . Diabetes mellitus   . Hypothyroid     No current facility-administered medications for this encounter.   No current outpatient prescriptions on file.   Facility-Administered Medications Ordered in Other Encounters  Medication Dose Route Frequency Provider Last Rate Last Dose  . 0.9 %  sodium chloride infusion  250 mL Intravenous PRN Amy Clegg, NP      . 0.9 %  sodium chloride infusion  1 mL/kg/hr Intravenous Continuous Amy Clegg, NP 73.9 mL/hr at 06/12/11 1339 1 mL/kg/hr at 06/12/11 1339  . 0.9 %  sodium chloride infusion   Intravenous Continuous Wendall Stade, MD      . aspirin chewable tablet 324 mg  324 mg Oral Pre-Cath Amy Clegg, NP      . diazepam (VALIUM) tablet 5 mg  5 mg Oral On Call Tonye Becket, NP      . nitroGLYCERIN (NITROSTAT) SL tablet 0.4 mg  0.4 mg Sublingual Q5 min PRN Wendall Stade, MD   0.4  mg at 06/12/11 1333  . sodium chloride 0.9 % injection 3 mL  3 mL Intravenous Q12H Amy Clegg, NP      . sodium chloride 0.9 % injection 3 mL  3 mL Intravenous PRN Tonye Becket, NP        Allergies  Allergen Reactions  . Tetanus Toxoids Swelling and Rash    History   Social History  . Marital Status: Married    Spouse Name: N/A    Number of Children: N/A  . Years of Education: N/A   Occupational History  . Not on file.   Social History Main Topics  . Smoking status: Former Smoker    Quit date: 04/14/1968  . Smokeless tobacco: Never Used  . Alcohol Use: 1.2 oz/week    2 Glasses of wine per week  . Drug Use: No  . Sexually Active:    Other Topics Concern  . Not on file   Social History Narrative  . No narrative on file  She is a  Jehovah witness.  Reports that she quit smoking about 43 years ago. She has never used smokeless tobacco. She reports that she drinks about 1.2 ounces of alcohol per week. She reports that she does not use illicit drugs.   No family history on file.  No CAD.    PHYSICAL EXAM: Filed Vitals:   06/12/11 1212  BP: 122/64  Pulse: 67  Weight: 163 lb (73.936 kg)  SpO2: 100%   General:  Well appearing. No respiratory difficulty HEENT: normal Neck: supple. no JVD. Carotids 2+ bilat; no bruits. No lymphadenopathy or thryomegaly appreciated. Cor: PMI nondisplaced. Regular rate & rhythm. No rubs, gallops or murmurs. Lungs: clear Abdomen: soft, nontender, nondistended. No hepatosplenomegaly. No bruits or masses. Good bowel sounds. Extremities: no cyanosis, clubbing, rash, edema Neuro: alert & oriented x 3, cranial nerves grossly intact. moves all 4 extremities w/o difficulty. Affect pleasant.  ECG: NSR 78 bpm.  V1-V2 Q waves.     ASSESSMENT & PLAN:

## 2011-06-12 NOTE — Brief Op Note (Signed)
See operative note  Alyssa [name]  

## 2011-06-12 NOTE — H&P (View-Only) (Signed)
PCP:   HAMRICK,MAURA L, MD, MD   Chief Complaint:  Chest pressure  HPI: 76-year-old woman with history of diabetes and hypothyroidism, presented to the emergency room with complaints of chest pressure, as well as dyspnea on exertion and difficulty climbing stairs. She does not have any cardiac history and recalls that years ago she had a stress test was fine. Currently she feels okay but reports that at night she is having difficulty when she is trying to stay flat in bed on her back.   Review of Systems:  The patient has lost 30 pounds over the past 6 months and has gained back 5 pounds over the past week The patient denies anorexia, fever,  vision loss, decreased hearing, hoarseness, syncope, dyspnea on exertion, , balance deficits, hemoptysis, abdominal pain, melena, hematochezia, severe indigestion/heartburn, hematuria, incontinence, genital sores, muscle weakness, suspicious skin lesions, transient blindness, difficulty walking, depression, unusual weight change, abnormal bleeding,  Past Medical History: Past Medical History  Diagnosis Date  . Diabetes mellitus   . Hypothyroid    Past Surgical History  Procedure Date  . Abdominal hysterectomy     Medications: Prior to Admission medications   Medication Sig Start Date End Date Taking? Authorizing Provider  glimepiride (AMARYL) 4 MG tablet Take 6 mg by mouth daily before breakfast.   Yes Historical Provider, MD  losartan (COZAAR) 50 MG tablet Take 50 mg by mouth daily.   Yes Historical Provider, MD  metFORMIN (GLUCOPHAGE-XR) 500 MG 24 hr tablet Take 500 mg by mouth 2 (two) times daily.   Yes Historical Provider, MD  nitroGLYCERIN (NITROSTAT) 0.4 MG SL tablet Place 0.4 mg under the tongue every 5 (five) minutes as needed. For chest pain   Yes Historical Provider, MD  polyethylene glycol (MIRALAX / GLYCOLAX) packet Take 17 g by mouth daily as needed.   Yes Historical Provider, MD  pravastatin (PRAVACHOL) 40 MG tablet Take 40 mg by  mouth at bedtime.   Yes Historical Provider, MD  spironolactone (ALDACTONE) 50 MG tablet Take 25 mg by mouth daily.   Yes Historical Provider, MD    Allergies:   Allergies  Allergen Reactions  . Tetanus Toxoids     Social History:  reports that she quit smoking about 43 years ago. She has never used smokeless tobacco. She reports that she drinks about 1.2 ounces of alcohol per week. She reports that she does not use illicit drugs.  History   Social History Narrative  . No narrative on file     Family History: History reviewed. No pertinent family history.  Physical Exam: Filed Vitals:   05/26/11 1337 05/26/11 1529 05/26/11 1832 05/26/11 1900  BP: 129/60 125/86 143/63 141/77  Pulse: 68 56 72 65  Temp: 97.6 F (36.4 C)   97.9 F (36.6 C)  TempSrc: Oral   Oral  Resp: 16 14 17 18  Height: 5' 4" (1.626 m)     Weight: 76.204 kg (168 lb)     SpO2: 96% 100% 99% 99%   General appearance: alert, cooperative, appears stated age and mild distress Head: Normocephalic, without obvious abnormality, atraumatic Eyes: conjunctivae/corneas clear. PERRL, EOM's intact. Fundi benign. Nose: Nares normal. Septum midline. Mucosa normal. No drainage or sinus tenderness. Throat: lips, mucosa, and tongue normal; teeth and gums normal Back: symmetric, no curvature. ROM normal. No CVA tenderness. Resp: rales bilaterally Cardio: regular rate and rhythm, S1, S2 normal, no murmur, click, rub or gallop GI: soft, non-tender; bowel sounds normal; no masses,  no organomegaly   Extremities: extremities normal, atraumatic, no cyanosis or edema Pulses: 2+ and symmetric Skin: Skin color, texture, turgor normal. No rashes or lesions Neurologic: Grossly normal   Labs on Admission:   Basename 05/26/11 1518  NA 140  K 4.1  CL 106  CO2 21  GLUCOSE 142*  BUN 9  CREATININE 0.59  CALCIUM 9.6  MG --  PHOS --    Basename 05/26/11 1518  WBC 7.0  NEUTROABS --  HGB 13.1  HCT 40.1  MCV 80.5  PLT 264     Basename 05/26/11 1520  CKTOTAL 62  CKMB 3.4  CKMBINDEX --  TROPONINI --   No results found for this basename: TSH,T4TOTAL,FREET3,T3FREE,THYROIDAB in the last 72 hours No results found for this basename: VITAMINB12:2,FOLATE:2,FERRITIN:2,TIBC:2,IRON:2,RETICCTPCT:2 in the last 72 hours  Radiological Exams on Admission: Dg Chest 2 View  05/26/2011  *RADIOLOGY REPORT*  Clinical Data: Chest heaviness, exhaustion  CHEST - 2 VIEW  Comparison: None  Findings: Minimal enlargement of cardiac silhouette. Mediastinal contours and pulmonary vascularity normal. Lungs appear slightly emphysematous with minimal subsegmental atelectasis or scarring at right base. Lungs otherwise clear. No pleural effusion or pneumothorax. Bones demineralized with significant levoconvex cervicothoracic scoliosis. Scattered end plate spur formation thoracic spine.  IMPRESSION: Minimal enlargement of cardiac silhouette. Emphysematous changes with minimal subsegmental atelectasis or scarring at right base.  Original Report Authenticated By: MARK A. BOLES, M.D.    Assessment/Plan  76-year-old woman admitted for chest pressure and congestive heart failure type symptoms.  Plan is to observe her overnight on telemetry, cycle cardiac enzymes, obtain echocardiogram. We will check a pro BNP level. She will be treated with intravenous furosemide to see if her symptoms improve. If the echocardiogram is within normal limits and shows no arrhythmia or abnormal cardiac enzymes she did could be reffered for outpatient stress testing. If echocardiogram is abnormal she will need inpatient cardiology consultation. We will continue the diabetic medication and the Synthroid without change, and we will check a TSH level. Present on Admission:  .Hypothyroid .DM type 2 (diabetes mellitus, type 2) .CHF (congestive heart failure)  Darrin Koman 05/26/2011, 9:36 PM  

## 2011-06-12 NOTE — Progress Notes (Signed)
Referring Physician: Dr. Elisabeth Pigeon Primary Care: Dr. Nathanial Rancher Primary Cardiologist: none   HPI: Alyssa [name] is a 76 y.o. Jehovah Witness with past medical history pertinent for DM2 and hypothyroidism.  She was admitted for overnight observation on 2/11 due to chest pressure and dyspnea on exertion.  She ruled out for myocardial infarction but EKG revealed septal Q waves with inferolateral T wave inversions but no acute abnormalities.  Echo on 05/27/11 showed LVEF 55-60%, grade 1 diastolic dysfunction.   She presents to the HF clinic today for further evaluation.  She drove back from [place] yesterday due to progressive chest pain.  She is having pressure in her chest that radiates down both arms with dyspnea.  This has awoken her from sleep, last episode last night.  This resolved with her husband's nitroglycerin.  This pain also comes with walking up the stairs.  She currently has no chest pain.  Her dyspnea and cough have been progressive over the last several months.  She denies fever/chills.  Vital signs stable.      Review of Systems: [y] = yes, [ ]  = no   General: Weight gain [ ] ; Weight loss [ ] ; Anorexia [ ] ; Fatigue [ ] ; Fever [ ] ; Chills [ ] ; Weakness [ ]   Cardiac: Chest pain/pressure Cove.Etienne ]; Resting SOB y]; Exertional SOB Cove.Etienne ]; Orthopnea Cove.Etienne ]; Pedal Edema [ ] ; Palpitations [ ] ; Syncope [ ] ; Presyncope [ ] ; Paroxysmal nocturnal dyspnea[ ]   Pulmonary: Cough [ y]; Wheezing[ ] ; Hemoptysis[ ] ; Sputum [ ] ; Snoring [ ]   GI: Vomiting[ ] ; Dysphagia[ ] ; Melena[ ] ; Hematochezia [ ] ; Heartburn[ ] ; Abdominal pain [ ] ; Constipation [ ] ; Diarrhea [ ] ; BRBPR [ ]   GU: Hematuria[ ] ; Dysuria [ ] ; Nocturia[ ]   Vascular: Pain in legs with walking [ ] ; Pain in feet with lying flat [ ] ; Non-healing sores [ ] ; Stroke [ ] ; TIA [ ] ; Slurred speech [ ] ;  Neuro: Headaches[ ] ; Vertigo[ ] ; Seizures[ ] ; Paresthesias[ ] ;Blurred vision [ ] ; Diplopia [ ] ; Vision changes [ ]   Ortho/Skin: Arthritis [ ] ; Joint pain [ ] ; Muscle  pain [ ] ; Joint swelling [ ] ; Back Pain [ ] ; Rash [ ]   Psych: Depression[ ] ; Anxiety[ ]   Heme: Bleeding problems [ ] ; Clotting disorders [ ] ; Anemia [ ]   Endocrine: Diabetes Cove.Etienne ]; Thyroid dysfunction[y ]   Past Medical History  Diagnosis Date  . Diabetes mellitus   . Hypothyroid     Current Outpatient Prescriptions  Medication Sig Dispense Refill  . glimepiride (AMARYL) 4 MG tablet Take 6 mg by mouth daily before breakfast.      . losartan (COZAAR) 50 MG tablet Take 50 mg by mouth daily.      . metFORMIN (GLUCOPHAGE-XR) 500 MG 24 hr tablet Take 500 mg by mouth 2 (two) times daily.      . nitroGLYCERIN (NITROSTAT) 0.4 MG SL tablet Place 0.4 mg under the tongue every 5 (five) minutes as needed. For chest pain      . polyethylene glycol (MIRALAX / GLYCOLAX) packet Take 17 g by mouth daily as needed.      . pravastatin (PRAVACHOL) 40 MG tablet Take 40 mg by mouth at bedtime.      [name] spironolactone (ALDACTONE) 50 MG tablet Take 25 mg by mouth daily.        Allergies  Allergen Reactions  . Tetanus Toxoids     History   Social History  . Marital Status: Married    Spouse  Name: N/A    Number of Children: N/A  . Years of Education: N/A   Occupational History  . Not on file.   Social History Main Topics  . Smoking status: Former Smoker    Quit date: 04/14/1968  . Smokeless tobacco: Never Used  . Alcohol Use: 1.2 oz/week    2 Glasses of wine per week  . Drug Use: No  . Sexually Active:    Other Topics Concern  . Not on file   Social History Narrative  . No narrative on file  She is a Jehovah witness.  Reports that she quit smoking about 43 years ago. She has never used smokeless tobacco. She reports that she drinks about 1.2 ounces of alcohol per week. She reports that she does not use illicit drugs.   No family history on file.  No CAD.    PHYSICAL EXAM: Filed Vitals:   06/12/11 1212  Pulse: 67  Weight: 163 lb (73.936 kg)  SpO2: 100%   General:  Well appearing.  No respiratory difficulty HEENT: normal Neck: supple. no JVD. Carotids 2+ bilat; no bruits. No lymphadenopathy or thryomegaly appreciated. Cor: PMI nondisplaced. Regular rate & rhythm. No rubs, gallops or murmurs. Lungs: clear Abdomen: soft, nontender, nondistended. No hepatosplenomegaly. No bruits or masses. Good bowel sounds. Extremities: no cyanosis, clubbing, rash, edema Neuro: alert & oriented x 3, cranial nerves grossly intact. moves all 4 extremities w/o difficulty. Affect pleasant.  ECG: NSR 78 bpm.  V1-V2 Q waves.     ASSESSMENT & PLAN:

## 2011-06-12 NOTE — Assessment & Plan Note (Addendum)
Patient's symptoms are concerning for unstable angina and with her abnormal EKG will proceed with cardiac catheterization today.  Risk/benefits and alternatives were discussed with the patient and her husband and she agrees to proceed.  She has been transferred to short stay from the clinic for preparation of cardiac cath.    Patient seen and examined with Ulyess Blossom PA-C. We discussed all aspects of the encounter. I agree with the assessment and plan as stated above. Symptoms and ECG very concerning for underlying CAD with crescendo angina. No evidence of HF on exam. Will need cath today. Discussed with her and her husband.

## 2011-06-13 DIAGNOSIS — R079 Chest pain, unspecified: Secondary | ICD-10-CM

## 2011-06-13 DIAGNOSIS — I509 Heart failure, unspecified: Secondary | ICD-10-CM

## 2011-06-13 LAB — CBC
MCH: 26.4 pg (ref 26.0–34.0)
MCHC: 32.7 g/dL (ref 30.0–36.0)
Platelets: 254 10*3/uL (ref 150–400)
RDW: 14.3 % (ref 11.5–15.5)

## 2011-06-13 LAB — BASIC METABOLIC PANEL
Calcium: 9.3 mg/dL (ref 8.4–10.5)
GFR calc Af Amer: >90 mL/min (ref >90)
GFR calc non Af Amer: 81 mL/min — ABNORMAL LOW (ref >90)
Glucose, Bld: 120 mg/dL — ABNORMAL HIGH (ref 70–99)
Potassium: 4 mEq/L (ref 3.5–5.1)
Sodium: 140 mEq/L (ref 135–145)

## 2011-06-13 MED ORDER — LOSARTAN POTASSIUM 50 MG PO TABS
100.0000 mg | ORAL_TABLET | Freq: Every day | ORAL | Status: DC
  Administered 2011-06-13 – 2011-06-14 (×2): 100 mg via ORAL
  Filled 2011-06-13 (×2): qty 2

## 2011-06-13 MED ORDER — TICAGRELOR 90 MG PO TABS
90.0000 mg | ORAL_TABLET | Freq: Two times a day (BID) | ORAL | Status: DC
  Administered 2011-06-13 – 2011-06-14 (×3): 90 mg via ORAL
  Filled 2011-06-13 (×5): qty 1

## 2011-06-13 MED FILL — Dextrose Inj 5%: INTRAVENOUS | Qty: 50 | Status: AC

## 2011-06-13 NOTE — Progress Notes (Signed)
Encounter addended by: Sanda Linger on: 06/13/2011  7:54 AM<BR>     Documentation filed: Charges VN

## 2011-06-13 NOTE — Progress Notes (Signed)
Patient ID: Alyssa [name], female   DOB: 1936/01/20, 76 y.o.   MRN: [id number] @ Subjective:  Denies SSCP, palpitations or Dyspnea   Objective:  Filed Vitals:   06/12/11 2331 06/13/11 0235 06/13/11 0410 06/13/11 0821  BP: 139/51 143/53 119/66 131/45  Pulse: 87 82 84 91  Temp: 97.9 F (36.6 C)  98.1 F (36.7 C) 98.2 F (36.8 C)  TempSrc: Oral  Oral Oral  Resp: 21 19 18 15   Height:      Weight:      SpO2: 99% 99% 96% 96%    Intake/Output from previous day:  Intake/Output Summary (Last 24 hours) at 06/13/11 0841 Last data filed at 06/13/11 0600  Gross per 24 hour  Intake   1104 ml  Output   2800 ml  Net  -1696 ml    Physical Exam: Affect appropriate Healthy:  appears stated age HEENT: normal Neck supple with no adenopathy JVP normal no bruits no thyromegaly Lungs clear with no wheezing and good diaphragmatic motion Heart:  S1/S2 no murmur, no rub, gallop or click PMI normal Abdomen: benighn, BS positve, no tenderness, no AAA no bruit.  No HSM or HJR Distal pulses intact with no bruits No edema Neuro non-focal Skin warm and dry No muscular weakness Cath site A no hematoma  Lab Results: Basic Metabolic Panel:  Basename 06/13/11 0545 06/12/11 1325  NA 140 136  K 4.0 4.6  CL 107 102  CO2 26 25  GLUCOSE 120* 162*  BUN 9 11  CREATININE 0.79 0.66  CALCIUM 9.3 10.4  MG -- --  PHOS -- --  CBC:  Basename 06/13/11 0545 06/12/11 1325  WBC 7.9 9.3  NEUTROABS -- --  HGB 12.2 13.6  HCT 37.3 41.0  MCV 80.7 79.9  PLT 254 307    Imaging: No results found.  Cardiac Studies:  ECG:  NSR rate 75 biphasic T waves in precordium no ST elevation   Telemetry: NSR no arrhythmia  Medications:     . aspirin  81 mg Oral Daily  . bivalirudin      . fentaNYL      . glimepiride  6 mg Oral QAC breakfast  . heparin      . levothyroxine  25 mcg Oral Daily  . lidocaine      . losartan  50 mg Oral Daily  . midazolam      . midazolam      . nitroGLYCERIN      .  simvastatin  20 mg Oral q1800  . sodium chloride  3 mL Intravenous Q12H  . spironolactone  25 mg Oral Daily  . Ticagrelor      . Ticagrelor  90 mg Oral BID  . DISCONTD: aspirin  324 mg Oral Pre-Cath  . DISCONTD: diazepam  5 mg Oral On Call  . DISCONTD: sodium chloride  3 mL Intravenous Q12H       . sodium chloride 0.997 mL/kg/hr (06/12/11 1645)  . DISCONTD: sodium chloride 1 mL/kg/hr (06/12/11 1339)  . DISCONTD: sodium chloride    . DISCONTD: sodium chloride      Assessment/Plan:  CAD:  Tight LAD S/P stent. Continue bid Ticagrelor and low dose ASA.  Discussed with Dr Excell Seltzer. D/C patient In am if stable.  Outpatient functional study in a few weeks for OM1 lesion.  He does not want to do staged PCI CHF:  EF 45% by LV gram.  Increase losartan to 100mg .  Continue aldactone.  Consider adding low dose  coreg As outpatient  [name] 06/13/2011, 8:41 AM

## 2011-06-13 NOTE — Progress Notes (Signed)
CARDIAC REHAB PHASE I   PRE:  Rate/Rhythm: 92 SR    BP: sitting 126/57    SaO2:   MODE:  Ambulation: 350 ft   POST:  Rate/Rhythm: 111 ST    BP: sitting 132/54     SaO2:   Assist x1. Some SOB, had to rest at half way. No CP. Pt sts she did not know she had an MI. Ed completed and requests her name be sent to [facility]. Can walk more today. [phone]  Harriet Masson CES, ACSM

## 2011-06-14 DIAGNOSIS — I2510 Atherosclerotic heart disease of native coronary artery without angina pectoris: Secondary | ICD-10-CM

## 2011-06-14 MED ORDER — LOSARTAN POTASSIUM 100 MG PO TABS: 100.0000 mg | ORAL_TABLET | Freq: Every day | ORAL | Status: DC

## 2011-06-14 MED ORDER — NITROGLYCERIN 0.4 MG SL SUBL: 0.4000 mg | SUBLINGUAL_TABLET | SUBLINGUAL | Status: DC | PRN

## 2011-06-14 MED ORDER — TICAGRELOR 90 MG PO TABS: 90.0000 mg | ORAL_TABLET | Freq: Two times a day (BID) | ORAL | Status: DC

## 2011-06-14 NOTE — Discharge Instructions (Signed)
Resume metformin in 48 hours.  Call office for any bleeding/swelling of catheterization incision site.

## 2011-06-14 NOTE — Discharge Summary (Signed)
Physician Discharge Summary  Patient ID: Alyssa [name] MRN: [id number] DOB/AGE: [birth year] 76 y.o.  Admit date: 06/12/2011 Discharge date: 06/14/2011  Primary Cardiologist:* Nicholes Mango, MD  Primary Discharge Diagnosis: 1 CAD  - s/p Non-STEMI   - DES proximal LAD  - Residual 70-8% mid OM 1  - EF 45-50%; apical dyskinesis, no thrombus  Secondary Discharge Diagnoses: Past Medical History  Diagnosis Date  . Diabetes mellitus   . Hypothyroid     Reason for Admission: 76 year old female, with no prior history of heart disease, admitted directly from the office for further evaluation of symptoms worrisome for UAP, following a brief overnight hospitalization during which she ruled out for MI, and had a 2-D echo indicating normal LVF (EF 55-60%).  Procedures: Left Heart Cath, Selective Coronary Angiography, LV angiography  Hospital Course: Patient presented with ongoing chest discomfort, with EKG suggestive of recent anterior infarction. There was nondiagnostic ST segment elevation, but the patient clearly had acute coronary syndrome. She was brought fairly urgently for cardiac catheterization, performed by Dr. Eden Emms, which demonstrated severe thrombotic stenosis in the proximal LAD with TIMI 2 flow. The patient also was noted to have moderately severe left circumflex stenosis. The right coronary artery was diffusely diseased without tight stenoses. Patient underwent successful PCI of the LAD culprit lesion, with a drug-eluting stent, with no noted complications.  Recommendation is treat with low-dose ASA and Brilinta for a minimum of 12 months, without interruption. Recommendation is for the patient to have an outpatient functional study in a few weeks, for further evaluation of the OM1 lesion. Of note, Dr Excell Seltzer does not want to do staged PCI, at this time.  Regarding medications, losartan was increased to 100mg  yesterday. Plan is to continue aldactone, and consider adding low dose coreg as  outpatient.   Discharge Vitals: Blood pressure 134/55, pulse 104, temperature 98.6 F (37 C), temperature source Oral, resp. rate 16, height 5\' 2"  (1.575 m), weight 162 lb 7.7 oz (73.7 kg), SpO2 98.00%.  Labs: Lab Results  Component Value Date   WBC 7.9 06/13/2011   HGB 12.2 06/13/2011   HCT 37.3 06/13/2011   MCV 80.7 06/13/2011   PLT 254 06/13/2011      Lab 06/13/11 0545  NA 140  K 4.0  CL 107  CO2 26  BUN 9  CREATININE 0.79  CALCIUM 9.3  ALBUMIN --  PROT --  BILITOT --  ALKPHOS --  ALT --  AST --  GLUCOSE 120*    No results found for this basename: CHOL,  HDL,  LDLCALC,  TRIG    No results found for this basename: DDIMER    Lab Results  Component Value Date   TSH 0.555 05/26/2011    No results found for this basename: CKTOTAL:3,CKMB:3,TROPONINI:3 in the last 72 hours  Diagnostic Studies: Dg Chest 2 View  05/26/2011  *RADIOLOGY REPORT*  Clinical Data: Chest heaviness, exhaustion  CHEST - 2 VIEW  Comparison: None  Findings: Minimal enlargement of cardiac silhouette. Mediastinal contours and pulmonary vascularity normal. Lungs appear slightly emphysematous with minimal subsegmental atelectasis or scarring at right base. Lungs otherwise clear. No pleural effusion or pneumothorax. Bones demineralized with significant levoconvex cervicothoracic scoliosis. Scattered end plate spur formation thoracic spine.  IMPRESSION: Minimal enlargement of cardiac silhouette. Emphysematous changes with minimal subsegmental atelectasis or scarring at right base.  Original Report Authenticated By: Lollie Marrow, M.D.     DISPOSITION: Stable condition  FOLLOW UP PLANS AND APPOINTMENTS: Discharge Orders    Future  Orders Please Complete By Expires   Diet - low sodium heart healthy      Increase activity slowly          DISCHARGE MEDICATIONS: Medication List  As of 06/14/2011 10:02 AM   STOP taking these medications         aspirin 325 MG tablet         TAKE these medications           aspirin 81 MG chewable tablet   Chew 81 mg by mouth daily as needed. For pressure or chest pain      glimepiride 4 MG tablet   Commonly known as: AMARYL   Take 6 mg by mouth daily before breakfast.      levothyroxine 25 MCG tablet   Commonly known as: SYNTHROID, LEVOTHROID   Take 25 mcg by mouth daily.      losartan 100 MG tablet   Commonly known as: COZAAR   Take 1 tablet (100 mg total) by mouth daily.      metFORMIN 500 MG 24 hr tablet   Commonly known as: GLUCOPHAGE-XR   Take 500 mg by mouth 2 (two) times daily.      nitroGLYCERIN 0.4 MG SL tablet   Commonly known as: NITROSTAT   Place 1 tablet (0.4 mg total) under the tongue every 5 (five) minutes as needed for chest pain.      polyethylene glycol packet   Commonly known as: MIRALAX / GLYCOLAX   Take 17 g by mouth daily as needed.      pravastatin 40 MG tablet   Commonly known as: PRAVACHOL   Take 40 mg by mouth at bedtime.      spironolactone 50 MG tablet   Commonly known as: ALDACTONE   Take 25 mg by mouth daily.      Ticagrelor 90 MG Tabs tablet   Commonly known as: BRILINTA   Take 1 tablet (90 mg total) by mouth 2 (two) times daily.            BRING ALL MEDICATIONS WITH YOU TO FOLLOW UP APPOINTMENTS  Time spent with patient to include physician time: Greater than 30 minutes, including physician time.  Signed: Gene Serpe 06/14/2011, 10:02 AM Co-Sign MD  I have seen, examined the patient, and reviewed the above assessment and plan.   Co Sign: Hillis Range, MD

## 2011-06-14 NOTE — Progress Notes (Signed)
Patient ID: Alyssa [name], female   DOB: 08-01-1935, 76 y.o.   MRN: [id number] @ Subjective:  Doing well today Denies CP or SOB and wants to go home   Objective:  Filed Vitals:   06/13/11 2000 06/14/11 0000 06/14/11 0425 06/14/11 0740  BP: 107/59 142/55 134/36 134/55  Pulse:      Temp: 97.9 F (36.6 C) 98 F (36.7 C) 98 F (36.7 C) 98.6 F (37 C)  TempSrc: Oral Oral Oral Oral  Resp: 16 18 16    Height:      Weight:      SpO2: 95% 96% 97% 98%    Intake/Output from previous day:  Intake/Output Summary (Last 24 hours) at 06/14/11 0830 Last data filed at 06/14/11 0700  Gross per 24 hour  Intake   1040 ml  Output   1700 ml  Net   -660 ml    Physical Exam: Affect appropriate Healthy:  appears stated age HEENT: normal Neck supple with no adenopathy JVP normal no bruits no thyromegaly Lungs clear with no wheezing and good diaphragmatic motion Heart:  S1/S2 no murmur, no rub, gallop or click PMI normal Abdomen: benighn, BS positve, no tenderness, no AAA no bruit.  No HSM or HJR Distal pulses intact with no bruits No edema Neuro non-focal   Lab Results: Basic Metabolic Panel:  Basename 06/13/11 0545 06/12/11 1325  NA 140 136  K 4.0 4.6  CL 107 102  CO2 26 25  GLUCOSE 120* 162*  BUN 9 11  CREATININE 0.79 0.66  CALCIUM 9.3 10.4  MG -- --  PHOS -- --  CBC:  Basename 06/13/11 0545 06/12/11 1325  WBC 7.9 9.3  NEUTROABS -- --  HGB 12.2 13.6  HCT 37.3 41.0  MCV 80.7 79.9  PLT 254 307     Telemetry: NSR no arrhythmia  Medications:      . aspirin  81 mg Oral Daily  . glimepiride  6 mg Oral QAC breakfast  . levothyroxine  25 mcg Oral Daily  . losartan  100 mg Oral Daily  . simvastatin  20 mg Oral q1800  . sodium chloride  3 mL Intravenous Q12H  . spironolactone  25 mg Oral Daily  . Ticagrelor  90 mg Oral BID  . DISCONTD: losartan  50 mg Oral Daily       Assessment/Plan:  CAD:  Doing well S/P PCI to LAD. Continue bid Ticagrelor and low dose ASA.     DC to home today Will need outpatient functional study in a few weeks for OM1 lesion.  Dr Excell Seltzer does not want to do staged PCI at this time. CHF:  EF 45% by LV gram.  Increased losartan to 100mg  yesterday.  Continue aldactone.  Consider adding low dose coreg as outpatient   Cardiac rehab in [facility] Return to see Alyssa [name] 1-2 weeks  Hillis Range 06/14/2011, 8:30 AM

## 2011-06-14 NOTE — Progress Notes (Signed)
CARDIAC REHAB PHASE I   PRE:  Rate/Rhythm: Sinus 98  BP:    Sitting: 132/42     SaO2: 99% Room Air  MODE:  Ambulation: 350 ft   POST:  Rate/Rhythm: Sinus Tach 101  BP:    Sitting: 145/55     SaO2: 99 Room air  Patient tolerated walk without Difficulty for discharge today [id number]   Alyssa [name]

## 2011-06-16 LAB — GLUCOSE, CAPILLARY: Glucose-Capillary: 67 mg/dL — ABNORMAL LOW (ref 70–99)

## 2011-06-18 ENCOUNTER — Telehealth: Payer: Self-pay | Admitting: Internal Medicine

## 2011-06-18 NOTE — Telephone Encounter (Signed)
Spoke with eric. He will fax the order to the heart failure clinic. Number given

## 2011-06-18 NOTE — Telephone Encounter (Signed)
New problem:  Need a sign referral before patient starts cardiac rehab. Will fax a blank referral over.

## 2011-06-19 ENCOUNTER — Telehealth: Payer: Self-pay | Admitting: Internal Medicine

## 2011-06-19 NOTE — Telephone Encounter (Signed)
All Recent Hospital Records faxed to [facility] @ [phone] 06/19/11/KM

## 2011-06-23 ENCOUNTER — Encounter (HOSPITAL_COMMUNITY): Payer: 59

## 2011-06-27 ENCOUNTER — Telehealth (HOSPITAL_COMMUNITY): Payer: Self-pay | Admitting: *Deleted

## 2011-06-27 NOTE — Telephone Encounter (Signed)
Tried to call pt back but no answer, will try again later.

## 2011-06-27 NOTE — Telephone Encounter (Signed)
Having muscle tightness in neck and shoulders since Wednesday.  Neck hurts when she moves.  Went to see Dr. Val Riles on Wed and was given tramadol.  This caused nausea so she has stopped taking it.  Slept with a heating pad last night that helped with the pain.  She awoke this morning still in pain.  She has called to see if there are other pain meds she can take.  We discussed that she can take other pain medications that Dr. Val Riles would like to prescribe.  She is awaiting a call back from him at this time.

## 2011-06-27 NOTE — Telephone Encounter (Signed)
Alyssa [name] called this am, she is calling due to some neck and shoulder pain she has been experiencing the past few days.  She states that she believes its from sitting to long in front of the computer on Tuesday evening.  She called her g/p Dr Val Riles, who prescribed tramadol and tylenol.  She states that she is getting no relief from this medication and she believes it is causing her to have an upset stomach.  I told Alyssa [name] that I will let one of our PA's know of her condition, but that she should still follow up with her g/p.

## 2011-07-03 ENCOUNTER — Ambulatory Visit (HOSPITAL_COMMUNITY)
Admission: RE | Admit: 2011-07-03 | Discharge: 2011-07-03 | Disposition: A | Discharge status: Home / Self Care | Payer: Medicare Other | Source: Ambulatory Visit | Attending: Internal Medicine | Admitting: Internal Medicine

## 2011-07-03 VITALS — BP 120/56 | HR 94 | Wt 161.0 lb

## 2011-07-03 DIAGNOSIS — E785 Hyperlipidemia, unspecified: Secondary | ICD-10-CM | POA: Insufficient documentation

## 2011-07-03 DIAGNOSIS — I2510 Atherosclerotic heart disease of native coronary artery without angina pectoris: Secondary | ICD-10-CM | POA: Insufficient documentation

## 2011-07-03 DIAGNOSIS — R079 Chest pain, unspecified: Secondary | ICD-10-CM | POA: Insufficient documentation

## 2011-07-03 MED ORDER — CARVEDILOL 3.125 MG PO TABS: 3.1250 mg | ORAL_TABLET | Freq: Two times a day (BID) | ORAL | Status: DC

## 2011-07-03 NOTE — Patient Instructions (Signed)
Start Cardilol 3.125 mg Twice daily   We will contact you in 4 months to schedule your next appointment.

## 2011-07-03 NOTE — Progress Notes (Signed)
Referring Physician: Dr. Elisabeth Pigeon Primary Care: Dr. Nathanial Rancher Primary Cardiologist: none   HPI: Mrs. Poteet is a 76 y.o. Jehovah Witness with past medical history pertinent for DM2, hypothyroidism and CAD.    We saw her for the first time on 06/12/11 for evaluation for possible CHF after visit to ER.  Echo on 05/27/11 showed LVEF 55-60%, grade 1 diastolic dysfunction.  When we saw her we were concerned for [place]. Sent for cath.  Left mainstem: Normal  Left anterior descending (LAD): Thrombus with 99% lesion and TIMI 2 flow at take off of first septal perforator  IM: normal  D1: 70% ostial at LAD lesion  Left circumflex (LCx): 30% proximal and mid  OM1: 70-80% mid lesion large vessel  RCA: Dominant. 40% eccentric proximal 40% distal Some collaterals to LAD Left ventriculography: Apical dsykinesis with no thrombus EF 45-50%   Underwent stenting of LAD with Promus DES by Dr. Excell Seltzer.   Since her stent doing very well. No CP or dyspnea. Enrolled in cardiac rehab in [facility] starting next week. Taking Brillinta regularly. No bleeding. Taking pravastatin.  Dr. Nathanial Rancher following lipids.   Past Medical History  Diagnosis Date  . Diabetes mellitus   . Hypothyroid     Current Outpatient Prescriptions  Medication Sig Dispense Refill  . aspirin 81 MG chewable tablet Chew 81 mg by mouth daily as needed. For pressure or chest pain      . glimepiride (AMARYL) 4 MG tablet Take 6 mg by mouth daily before breakfast.      . levothyroxine (SYNTHROID, LEVOTHROID) 25 MCG tablet Take 25 mcg by mouth daily.      [name] losartan (COZAAR) 100 MG tablet Take 1 tablet (100 mg total) by mouth daily.  60 tablet  6  . metFORMIN (GLUCOPHAGE-XR) 500 MG 24 hr tablet Take 500 mg by mouth 2 (two) times daily.      . nitroGLYCERIN (NITROSTAT) 0.4 MG SL tablet Place 1 tablet (0.4 mg total) under the tongue every 5 (five) minutes as needed for chest pain.  25 tablet  1  . polyethylene glycol (MIRALAX / GLYCOLAX) packet  Take 17 g by mouth daily as needed.      . pravastatin (PRAVACHOL) 40 MG tablet Take 40 mg by mouth at bedtime.      [name] spironolactone (ALDACTONE) 50 MG tablet Take 25 mg by mouth daily.      . Ticagrelor (BRILINTA) 90 MG TABS tablet Take 1 tablet (90 mg total) by mouth 2 (two) times daily.  60 tablet  6    Allergies  Allergen Reactions  . Tetanus Toxoids Swelling and Rash    History   Social History  . Marital Status: Married    Spouse Name: N/A    Number of Children: N/A  . Years of Education: N/A   Occupational History  . Not on file.   Social History Main Topics  . Smoking status: Former Smoker    Quit date: 04/14/1968  . Smokeless tobacco: Never Used  . Alcohol Use: 1.2 oz/week    2 Glasses of wine per week  . Drug Use: No  . Sexually Active: Not Currently   Other Topics Concern  . Not on file   Social History Narrative  . No narrative on file  She is a Jehovah witness.  Reports that she quit smoking about 43 years ago. She has never used smokeless tobacco. She reports that she drinks about 1.2 ounces of alcohol per week. She reports that  she does not use illicit drugs.   No family history on file.  No CAD.    PHYSICAL EXAM: Filed Vitals:   07/03/11 1459  BP: 120/56  Pulse: 94  Weight: 161 lb (73.029 kg)  SpO2: 98%   General:  Well appearing. No respiratory difficulty HEENT: normal Neck: supple. no JVD. Carotids 2+ bilat; no bruits. No lymphadenopathy or thryomegaly appreciated. Cor: PMI nondisplaced. Regular rate & rhythm. No rubs, gallops or murmurs. Lungs: clear with decreased  BS throughout Abdomen: obese soft, nontender, nondistended. No hepatosplenomegaly. No bruits or masses. Good bowel sounds. Extremities: no cyanosis, clubbing, rash, edema Neuro: alert & oriented x 3, cranial nerves grossly intact. moves all 4 extremities w/o difficulty. Affect pleasant.     ASSESSMENT & PLAN:

## 2011-07-03 NOTE — Assessment & Plan Note (Addendum)
Doing well s/p PCI of LAD. No evidence of angina. Agree with cardiac rehab. Will start carvedilol 3.125 bid. If has recurrent symptoms of angina will need evaluation for possible PCI of OM lesion. If asymptomatic can consider f/u Myoview. Will need Brillinta for at least 1 year.

## 2011-07-03 NOTE — Progress Notes (Signed)
Encounter addended by: Noralee Space, RN on: 07/03/2011  3:55 PM<BR>     Documentation filed: Patient Instructions Section, Orders

## 2011-07-03 NOTE — Assessment & Plan Note (Signed)
Followed by Dr. Nathanial Rancher. Goal LDL < 70. Continue pravastatin. Can titrate as needed.

## 2011-09-15 ENCOUNTER — Encounter: Payer: Self-pay | Admitting: Internal Medicine

## 2011-10-14 ENCOUNTER — Encounter: Payer: Self-pay | Admitting: Physician Assistant

## 2011-10-14 ENCOUNTER — Ambulatory Visit (INDEPENDENT_AMBULATORY_CARE_PROVIDER_SITE_OTHER): Payer: Medicare Other | Admitting: Physician Assistant

## 2011-10-14 VITALS — BP 116/54 | HR 67 | Ht 61.5 in | Wt 159.0 lb

## 2011-10-14 DIAGNOSIS — R079 Chest pain, unspecified: Secondary | ICD-10-CM

## 2011-10-14 DIAGNOSIS — I2510 Atherosclerotic heart disease of native coronary artery without angina pectoris: Secondary | ICD-10-CM

## 2011-10-14 DIAGNOSIS — E785 Hyperlipidemia, unspecified: Secondary | ICD-10-CM

## 2011-10-14 NOTE — Patient Instructions (Addendum)
Your physician has requested that you have a lexiscan myoview DX CAD. For further information please visit [email or web address] Please follow instruction sheet, as given.  PLEASE MAKE APPT TO SEE DR. Gala Romney @ THE HEART FAILURE CLINIC AFTER YOU HAVE YOUR STRESS TEST

## 2011-10-14 NOTE — Progress Notes (Signed)
[address] [place], [place]  [address] Phone: [phone] Fax:  [phone]  Date:  10/14/2011   Name:  Alyssa [name]   DOB:  1936/02/01   MRN:  [id number]  PCP:  [name], MD  Primary Cardiologist:  Dr. Arvilla Meres  Primary Electrophysiologist:  None    History of Present Illness: Alyssa [name] is a 76 y.o. female who returns for evaluation of CP.  She is a history of DM2, hypothyroidism and CAD.  Admitted in 05/2011 with a NSTEMI.  LHC 06/04/11: LAD 99%, oD1 70% at LAD lesion, prox and mid CFX 30%, mOM1 70-80%, pRCA 40%, distal 40% with some collaterals to the LAD, EF 45-50% with apical dyskinesis and no thrombus.  PCI: Promus DES to the proximal LAD.    Echo 05/27/11: LVH, EF 55-60%, grade 1 diastolic dysfunction.    Last seen by Dr. Gala Romney 06/2011.  At that time, Dr. Gala Romney noted that the patient would need repeat catheterization with possible PCI of her OM lesion should she have recurrent chest symptoms.  Otherwise she would need a follow up nuclear study in the future.    We received correspondence recently from cardiac rehabilitation at [facility].  Patient noted taking nitroglycerin for chest discomfort for 3 days.  I was recommended that she see me today in follow up.    Patient was under the impression she had an appointment today for routine follow up.  She had one episode of CP while on recumbent bike several weeks ago.  This resolved with rest.  She has exercised without CP since.  Notes occasional episodes of CP.  Describes as tightness.  Not necessarily exertional.  Has assoc dyspnea.  No assoc nausea or diaphoresis.  No radiation.  Not like prior angina.  Takes NTG sometimes with relief.  Has taken NTG x 6 since her MI.  These symptoms have been occurring since her MI.  She denies worsening symptoms.    Wt Readings from Last 3 Encounters:  10/14/11 159 lb (72.122 kg)  07/03/11 161 lb (73.029 kg)   Potassium  Date/Time  Value Range Status  06/13/2011  5:45 AM 4.0  3.5 - 5.1 mEq/L Final     Creatinine, Ser  Date/Time Value Range Status  06/13/2011  5:45 AM 0.79  0.50 - 1.10 mg/dL Final    Past Medical History  Diagnosis Date  . Diabetes mellitus   . Hypothyroid   . HLD (hyperlipidemia)   . CAD (coronary artery disease)     NSTEMI 05/2011 - [facility] 06/04/11: LAD 99%, oD1 70% at LAD lesion, prox and mid CFX 30%, mOM1 70-80%, pRCA 40%, distal 40% with some collaterals to the LAD, EF 45-50% with apical dyskinesis and no thrombus.  PCI: Promus DES to the proximal LAD.;  Echo 05/27/11: LVH, EF 55-60%, grade 1 diastolic dysfunction.  [name] HTN (hypertension)     Current Outpatient Prescriptions  Medication Sig Dispense Refill  . aspirin 81 MG chewable tablet Chew 81 mg by mouth daily as needed. For pressure or chest pain      . carvedilol (COREG) 3.125 MG tablet Take 1 tablet (3.125 mg total) by mouth 2 (two) times daily with a meal.  60 tablet  6  . glimepiride (AMARYL) 4 MG tablet Take 6 mg by mouth daily before breakfast.      . levothyroxine (SYNTHROID, LEVOTHROID) 25 MCG tablet Take 25 mcg by mouth daily.      [name] losartan (COZAAR)  100 MG tablet Take 1 tablet (100 mg total) by mouth daily.  60 tablet  6  . metFORMIN (GLUCOPHAGE-XR) 500 MG 24 hr tablet Take 500 mg by mouth 2 (two) times daily.      . nitroGLYCERIN (NITROSTAT) 0.4 MG SL tablet Place 1 tablet (0.4 mg total) under the tongue every 5 (five) minutes as needed for chest pain.  25 tablet  1  . polyethylene glycol (MIRALAX / GLYCOLAX) packet Take 17 g by mouth daily as needed.      . pravastatin (PRAVACHOL) 40 MG tablet Take 40 mg by mouth at bedtime.      [name] spironolactone (ALDACTONE) 50 MG tablet Take 25 mg by mouth daily.      . Ticagrelor (BRILINTA) 90 MG TABS tablet Take 1 tablet (90 mg total) by mouth 2 (two) times daily.  60 tablet  6    Allergies: Allergies  Allergen Reactions  . Tetanus Toxoids Swelling and Rash    History  Substance Use Topics    . Smoking status: Former Smoker    Quit date: 04/14/1968  . Smokeless tobacco: Never Used  . Alcohol Use: 1.2 oz/week    2 Glasses of wine per week     ROS:  Please see the history of present illness.   She has a non productive cough.    All other systems reviewed and negative.   PHYSICAL EXAM: VS:  BP 116/54  Pulse 67  Ht 5' 1.5" (1.562 m)  Wt 159 lb (72.122 kg)  BMI 29.56 kg/m2 Well nourished, well developed, in no acute distress HEENT: normal Neck: no JVD Vascular: no carotid bruits Cardiac:  normal S1, S2; RRR; no murmur Lungs:  clear to auscultation bilaterally, no wheezing, rhonchi or rales Abd: soft, nontender, no hepatomegaly Ext: no edema Skin: warm and dry Neuro:  CNs 2-12 intact, no focal abnormalities noted  EKG:  Sinus rhythm, heart rate 67, normal axis, Q waves in V1-V2, poor wave progression, low voltage, no significant change compared to prior tracings      ASSESSMENT AND PLAN:  1.  Chest Pain Atypical and typical features.   Symptoms are not like prior angina. We revisited her symptoms leading up to her MI.  She had ongoing chest pain for several weeks prior.  Her current symptoms are nothing like this. We had a long discussion regarding further workup.  We discussed Dr. Reuel Boom Bensimhon's prior recommendations and option between cardiac cath vs stress testing. Since her symptoms are unlike prior angina, I have recommended proceeding with Lexiscan Myoview initially to assess for ischemia in the CFX distribution.   Continue current medical Rx. Follow up with Dr. Arvilla Meres after stress test in 2-3 weeks.  2.  Coronary Artery  Continue dual antiplatelet Rx. Proceed with myoview as noted.  3.  Hypertension Controlled.  Continue current therapy.  4.  Hyperlipidemia Managed by PCP.  Goal LDL < 70.   Signed, Tereso Newcomer, PA-C  3:08 PM 10/14/2011

## 2011-10-23 ENCOUNTER — Ambulatory Visit (HOSPITAL_COMMUNITY): Payer: Medicare Other | Attending: Internal Medicine | Admitting: Radiology

## 2011-10-23 VITALS — BP 134/63 | HR 79 | Ht 61.5 in | Wt 157.0 lb

## 2011-10-23 DIAGNOSIS — E785 Hyperlipidemia, unspecified: Secondary | ICD-10-CM | POA: Insufficient documentation

## 2011-10-23 DIAGNOSIS — R5383 Other fatigue: Secondary | ICD-10-CM | POA: Insufficient documentation

## 2011-10-23 DIAGNOSIS — Z87891 Personal history of nicotine dependence: Secondary | ICD-10-CM | POA: Insufficient documentation

## 2011-10-23 DIAGNOSIS — R5381 Other malaise: Secondary | ICD-10-CM | POA: Insufficient documentation

## 2011-10-23 DIAGNOSIS — R0609 Other forms of dyspnea: Secondary | ICD-10-CM | POA: Insufficient documentation

## 2011-10-23 DIAGNOSIS — I10 Essential (primary) hypertension: Secondary | ICD-10-CM | POA: Insufficient documentation

## 2011-10-23 DIAGNOSIS — R079 Chest pain, unspecified: Secondary | ICD-10-CM

## 2011-10-23 DIAGNOSIS — I2510 Atherosclerotic heart disease of native coronary artery without angina pectoris: Secondary | ICD-10-CM

## 2011-10-23 DIAGNOSIS — E119 Type 2 diabetes mellitus without complications: Secondary | ICD-10-CM | POA: Insufficient documentation

## 2011-10-23 DIAGNOSIS — R0989 Other specified symptoms and signs involving the circulatory and respiratory systems: Secondary | ICD-10-CM | POA: Insufficient documentation

## 2011-10-23 DIAGNOSIS — R0789 Other chest pain: Secondary | ICD-10-CM | POA: Insufficient documentation

## 2011-10-23 MED ORDER — REGADENOSON 0.4 MG/5ML IV SOLN
0.4000 mg | Freq: Once | INTRAVENOUS | Status: AC
  Administered 2011-10-23: 0.4 mg via INTRAVENOUS

## 2011-10-23 MED ORDER — TECHNETIUM TC 99M TETROFOSMIN IV KIT
33.0000 | PACK | Freq: Once | INTRAVENOUS | Status: AC | PRN
  Administered 2011-10-23: 33 via INTRAVENOUS

## 2011-10-23 MED ORDER — TECHNETIUM TC 99M TETROFOSMIN IV KIT
11.0000 | PACK | Freq: Once | INTRAVENOUS | Status: AC | PRN
  Administered 2011-10-23: 11 via INTRAVENOUS

## 2011-10-23 NOTE — Progress Notes (Signed)
[facility] SITE 3 NUCLEAR MED [address] [place] [address] [phone]  Cardiology Nuclear Med Study  Alyssa [name] is a 76 y.o. female     MRN : [id number]     DOB: 1936/02/19  Procedure Date: 10/23/2011  Nuclear Med Background Indication for Stress Test:  Evaluation for Ischemia and PTCA/Stent Patency  History:  >10 yrs ION:GEXBMW per patient; 2/13 Echo:EF=55-60%; 2/13 PTCA/Stent-LAD, EF=45-50% Cardiac Risk Factors: History of Smoking, Hypertension, Lipids and NIDDM  Symptoms:  Chest Pain/Tightness (last episode of chest discomfort was while under the camera today, 3-4/10; none now), DOE and Fatigue   Nuclear Pre-Procedure Caffeine/Decaff Intake:  None NPO After: 11:30pm   Lungs:  clear O2 Sat: 99% on room air. IV 0.9% NS with Angio Cath:  22g  IV Site: R Forearm  IV Started by:  Stanton Kidney, EMT-P  Chest Size (in):  38 Cup Size: DDD  Height: 5' 1.5" (1.562 m)  Weight:  157 lb (71.215 kg)  BMI:  Body mass index is 29.18 kg/(m^2). Tech Comments:  Meds taken as directed    Nuclear Med Study 1 or 2 day study: 1 day  Stress Test Type:  Treadmill/Lexiscan  Reading MD: Dietrich Pates, MD  Order Authorizing Provider:  Tereso Newcomer, PA-C; Arvilla Meres, MD  Resting Radionuclide: Technetium 28m Tetrofosmin  Resting Radionuclide Dose: 10.5 mCi   Stress Radionuclide:  Technetium 59m Tetrofosmin  Stress Radionuclide Dose: 31.8 mCi           Stress Protocol Rest HR: 79 Stress HR: 137  Rest BP: 134/63 Stress BP: 168/79  Exercise Time (min): 2:00 METS: n/a   Predicted Max HR: 147 bpm % Max HR: 93.2 bpm Rate Pressure Product: 41324   Dose of Adenosine (mg):  n/a Dose of Lexiscan: 0.4 mg  Dose of Atropine (mg): n/a Dose of Dobutamine: n/a mcg/kg/min (at max HR)  Stress Test Technologist: Smiley Houseman, CMA-N  Nuclear Technologist:  Domenic Polite, CNMT     Rest Procedure:  Myocardial perfusion imaging was performed at rest 45 minutes following the  intravenous administration of Technetium 63m Tetrofosmin.  Rest ECG: No acute changes.  Stress Procedure:  The patient received IV Lexiscan 0.4 mg over 15-seconds with concurrent low level exercise and then Technetium 74m Tetrofosmin was injected at 30-seconds while the patient continued walking one more minute. There were nonspecific ST-T wave changes with Lexiscan. She c/o chest tightness, 5/10, with Lexiscan.  Quantitative spect images were obtained after a 45-minute delay.  Stress ECG: Less than 1 mm ST depression in the inferior and lateral leads.  QPS Raw Data Images:  Images were motion corrected.  Extensive soft tissue (breast, diaphragm, bowel activity) surround heart. Stress Images:  Extensive soft tissue scatter.  Decreased counts in the inferior wall (mid, distal) appreciated in the short axis images only.  Thinning in the apex in the horizontal images.  Not seen elsewhere.  Otherwise normal perfusion.  Note underlying bowel activity. Rest Images:   Improvement in the short axis and horizontal images as noted above. Subtraction (SDS):  No significant ischemia. Transient Ischemic Dilatation (Normal <1.22):  1.01 Lung/Heart Ratio (Normal <0.45):  0.36  Quantitative Gated Spect Images QGS EDV:  37 ml QGS ESV:  10 ml  Impression Exercise Capacity:  Lexiscan with low level exercise. BP Response:  Normal blood pressure response. Clinical Symptoms:  Mild chest pain/dyspnea. ECG Impression:  Less than 1 mm ST depression in the inferior and lateral leads. Comparison with Prior  Nuclear Study: Question normal in past report.  Overall Impression:  Probable normal perfusion and soft tissue attenuation.  OVerall low risk scan.  LV Ejection Fraction: 74%.  LV Wall Motion:  NL LV Function; NL Wall Motion  Dietrich Pates

## 2011-10-27 ENCOUNTER — Other Ambulatory Visit (HOSPITAL_COMMUNITY): Payer: Self-pay | Admitting: *Deleted

## 2011-10-27 MED ORDER — CARVEDILOL 3.125 MG PO TABS: 3.1250 mg | ORAL_TABLET | Freq: Two times a day (BID) | ORAL | Status: DC

## 2011-10-28 ENCOUNTER — Telehealth: Payer: Self-pay | Admitting: *Deleted

## 2011-10-28 NOTE — Telephone Encounter (Signed)
lmom stress test low risk and to keep appt w/HF clinic 7/29

## 2011-10-28 NOTE — Telephone Encounter (Signed)
Message copied by Tarri Fuller on Tue Oct 28, 2011  5:28 PM ------      Message from: [place], [place] T      Created: Sat Oct 25, 2011  3:49 PM       Low risk scan.      Keep follow up with Dr. Arvilla Meres      Tereso Newcomer, PA-C  3:48 PM 10/25/2011

## 2011-10-28 NOTE — Telephone Encounter (Signed)
Message copied by Tarri Fuller on Tue Oct 28, 2011  5:26 PM ------      Message from: [place], [place] T      Created: Sat Oct 25, 2011  3:49 PM       Low risk scan.      Keep follow up with Dr. Arvilla Meres      Tereso Newcomer, PA-C  3:48 PM 10/25/2011

## 2011-11-10 ENCOUNTER — Encounter (HOSPITAL_COMMUNITY): Payer: Self-pay

## 2011-11-10 ENCOUNTER — Ambulatory Visit (HOSPITAL_COMMUNITY)
Admission: RE | Admit: 2011-11-10 | Discharge: 2011-11-10 | Disposition: A | Discharge status: Home / Self Care | Payer: Medicare Other | Source: Ambulatory Visit | Attending: Internal Medicine | Admitting: Internal Medicine

## 2011-11-10 VITALS — BP 99/60 | HR 60 | Ht 61.5 in | Wt 155.8 lb

## 2011-11-10 DIAGNOSIS — I2510 Atherosclerotic heart disease of native coronary artery without angina pectoris: Secondary | ICD-10-CM

## 2011-11-10 DIAGNOSIS — I5030 Unspecified diastolic (congestive) heart failure: Secondary | ICD-10-CM | POA: Insufficient documentation

## 2011-11-10 DIAGNOSIS — K219 Gastro-esophageal reflux disease without esophagitis: Secondary | ICD-10-CM

## 2011-11-10 MED ORDER — PANTOPRAZOLE SODIUM 40 MG PO TBEC: 40.0000 mg | DELAYED_RELEASE_TABLET | Freq: Every day | ORAL | Status: DC

## 2011-11-10 NOTE — Progress Notes (Signed)
Referring Physician: Dr. Elisabeth Pigeon Primary Care: Dr. Rosary Lively in [facility] (transferred from Dr. Nathanial Rancher) Primary Cardiologist: Dr. Gala Romney  HPI: Alyssa [name] is a 76 y.o. Jehovah Witness with past medical history pertinent for DM2, hypothyroidism and CAD.    We saw her for the first time on 06/12/11 for evaluation for possible CHF after visit to ER.  Echo on 05/27/11 showed LVEF 55-60%, grade 1 diastolic dysfunction.  Cath 06/12/11: Left mainstem: Normal  Left anterior descending (LAD): Thrombus with 99% lesion and TIMI 2 flow at take off of first septal perforator  IM: normal  D1: 70% ostial at LAD lesion  Left circumflex (LCx): 30% proximal and mid  OM1: 70-80% mid lesion large vessel  RCA: Dominant. 40% eccentric proximal 40% distal Some collaterals to LAD Left ventriculography: Apical dsykinesis with no thrombus EF 45-50%  Underwent stenting of LAD with Promus DES by Dr. Excell Seltzer.   Myoview 10/23/11: Overall low risk scan. LV Ejection Fraction: 74%. LV Wall Motion: NL LV Function; NL Wall Motion  She returns for follow up today.  She is tired because she hasn't slept much due to leg cramps over the last 2 nights.  Most of the time she is doing well.  She just finished cardiac rehab.  She hasn't been exercising since discharged from cardiac rehab going to look into silver sneakers today.  Has metallic taste and food doesn't taste right.  No appetite.  But this has been going on for months now.  No chest pain.  Has runny nose.  +cough, usually when she lays down.  Does not snore.    ROS: All pertinent positives and negatives as in HPI, otherwise negative.   Past Medical History  Diagnosis Date  . Diabetes mellitus   . Hypothyroid   . HLD (hyperlipidemia)   . CAD (coronary artery disease)     NSTEMI 05/2011 - LHC 06/04/11: LAD 99%, oD1 70% at LAD lesion, prox and mid CFX 30%, mOM1 70-80%, pRCA 40%, distal 40% with some collaterals to the LAD, EF 45-50% with apical dyskinesis and no  thrombus.  PCI: Promus DES to the proximal LAD.;  Echo 05/27/11: LVH, EF 55-60%, grade 1 diastolic dysfunction.  [name] HTN (hypertension)     Current Outpatient Prescriptions  Medication Sig Dispense Refill  . aspirin 81 MG chewable tablet Chew 81 mg by mouth daily. For pressure or chest pain      . carvedilol (COREG) 3.125 MG tablet Take 1 tablet (3.125 mg total) by mouth 2 (two) times daily with a meal.  180 tablet  3  . glimepiride (AMARYL) 4 MG tablet Take 6 mg by mouth daily before breakfast.      . levothyroxine (SYNTHROID, LEVOTHROID) 25 MCG tablet Take 25 mcg by mouth daily.      [name] losartan (COZAAR) 100 MG tablet Take 1 tablet (100 mg total) by mouth daily.  60 tablet  6  . metFORMIN (GLUCOPHAGE-XR) 500 MG 24 hr tablet Take 500 mg by mouth 2 (two) times daily.      . nitroGLYCERIN (NITROSTAT) 0.4 MG SL tablet Place 1 tablet (0.4 mg total) under the tongue every 5 (five) minutes as needed for chest pain.  25 tablet  1  . polyethylene glycol (MIRALAX / GLYCOLAX) packet Take 17 g by mouth daily as needed.      . pravastatin (PRAVACHOL) 40 MG tablet Take 40 mg by mouth at bedtime.      [name] spironolactone (ALDACTONE) 50 MG tablet Take 25 mg  by mouth daily.      . Ticagrelor (BRILINTA) 90 MG TABS tablet Take 1 tablet (90 mg total) by mouth 2 (two) times daily.  60 tablet  6    Allergies  Allergen Reactions  . Tetanus Toxoids Swelling and Rash   PHYSICAL EXAM: Filed Vitals:   11/10/11 1051  BP: 99/60  Pulse: 60  Height: 5' 1.5" (1.562 m)  Weight: 155 lb 12.8 oz (70.67 kg)  SpO2: 100%   General:  Well appearing. No respiratory difficulty HEENT: normal Neck: supple. no JVD. Carotids 2+ bilat; no bruits. No lymphadenopathy or thryomegaly appreciated. Cor: PMI nondisplaced. Regular rate & rhythm. No rubs, gallops or murmurs. Lungs: clear with decreased  BS throughout Abdomen: obese soft, nontender, nondistended. No hepatosplenomegaly. No bruits or masses. Good bowel sounds. Extremities:  no cyanosis, clubbing, rash, edema Neuro: alert & oriented x 3, cranial nerves grossly intact. moves all 4 extremities w/o difficulty. Affect pleasant.     ASSESSMENT & PLAN:

## 2011-11-10 NOTE — Patient Instructions (Addendum)
Will start protonix for reflux.    Follow up Dr. Gala Romney in 9 months.

## 2011-11-11 DIAGNOSIS — K219 Gastro-esophageal reflux disease without esophagitis: Secondary | ICD-10-CM | POA: Insufficient documentation

## 2011-11-11 NOTE — Assessment & Plan Note (Addendum)
Reviewed results of recent myoview with patient and husband, all questions answered.  No further ischemic symptoms.  Continue current medications.   Patient seen and examined with Ulyess Blossom, PA-C. We discussed all aspects of the encounter. I agree with the assessment and plan as stated above. CP resolved. Reviewed Myoview results with her and her husband. No further work-up at this time.

## 2011-11-11 NOTE — Assessment & Plan Note (Addendum)
Cough concerning for acid reflux, start Protonix.  Will follow up with PCP.  Attending: Agree.

## 2012-01-08 ENCOUNTER — Other Ambulatory Visit: Payer: Self-pay | Admitting: *Deleted

## 2012-01-08 MED ORDER — TICAGRELOR 90 MG PO TABS: 90.0000 mg | ORAL_TABLET | Freq: Two times a day (BID) | ORAL | Status: DC

## 2012-01-09 ENCOUNTER — Telehealth: Payer: Self-pay | Admitting: Internal Medicine

## 2012-01-09 NOTE — Telephone Encounter (Signed)
[facility] calling re for refill on brilinta

## 2012-01-12 NOTE — Telephone Encounter (Signed)
Spoke w/pharmacist, they didn't receive refill on 9/26 gave verbal order

## 2012-01-14 ENCOUNTER — Other Ambulatory Visit: Payer: Self-pay

## 2012-01-14 MED ORDER — TICAGRELOR 90 MG PO TABS: 90.0000 mg | ORAL_TABLET | Freq: Two times a day (BID) | ORAL | Status: DC

## 2012-02-03 ENCOUNTER — Other Ambulatory Visit (HOSPITAL_COMMUNITY): Payer: Self-pay | Admitting: *Deleted

## 2012-02-03 MED ORDER — CARVEDILOL 3.125 MG PO TABS: 3.1250 mg | ORAL_TABLET | Freq: Two times a day (BID) | ORAL | Status: DC

## 2012-02-25 ENCOUNTER — Encounter (HOSPITAL_COMMUNITY): Payer: Self-pay | Admitting: *Deleted

## 2012-02-25 ENCOUNTER — Inpatient Hospital Stay (HOSPITAL_COMMUNITY)
Admission: EM | Admit: 2012-02-25 | Discharge: 2012-02-28 | DRG: 378 | Disposition: A | Discharge status: Home Health | Payer: Medicare Other | Attending: Internal Medicine | Admitting: Internal Medicine

## 2012-02-25 ENCOUNTER — Other Ambulatory Visit: Payer: Self-pay | Admitting: Gastroenterology

## 2012-02-25 ENCOUNTER — Emergency Department (HOSPITAL_COMMUNITY): Payer: Medicare Other

## 2012-02-25 DIAGNOSIS — E119 Type 2 diabetes mellitus without complications: Secondary | ICD-10-CM

## 2012-02-25 DIAGNOSIS — I2510 Atherosclerotic heart disease of native coronary artery without angina pectoris: Secondary | ICD-10-CM

## 2012-02-25 DIAGNOSIS — I10 Essential (primary) hypertension: Secondary | ICD-10-CM | POA: Diagnosis present

## 2012-02-25 DIAGNOSIS — K5521 Angiodysplasia of colon with hemorrhage: Principal | ICD-10-CM | POA: Diagnosis present

## 2012-02-25 DIAGNOSIS — K5730 Diverticulosis of large intestine without perforation or abscess without bleeding: Secondary | ICD-10-CM | POA: Diagnosis present

## 2012-02-25 DIAGNOSIS — Z9861 Coronary angioplasty status: Secondary | ICD-10-CM

## 2012-02-25 DIAGNOSIS — Z79899 Other long term (current) drug therapy: Secondary | ICD-10-CM

## 2012-02-25 DIAGNOSIS — I5032 Chronic diastolic (congestive) heart failure: Secondary | ICD-10-CM | POA: Diagnosis present

## 2012-02-25 DIAGNOSIS — Z7982 Long term (current) use of aspirin: Secondary | ICD-10-CM

## 2012-02-25 DIAGNOSIS — R739 Hyperglycemia, unspecified: Secondary | ICD-10-CM

## 2012-02-25 DIAGNOSIS — I509 Heart failure, unspecified: Secondary | ICD-10-CM

## 2012-02-25 DIAGNOSIS — E039 Hypothyroidism, unspecified: Secondary | ICD-10-CM | POA: Diagnosis present

## 2012-02-25 DIAGNOSIS — I252 Old myocardial infarction: Secondary | ICD-10-CM

## 2012-02-25 DIAGNOSIS — R55 Syncope and collapse: Secondary | ICD-10-CM

## 2012-02-25 DIAGNOSIS — E785 Hyperlipidemia, unspecified: Secondary | ICD-10-CM

## 2012-02-25 DIAGNOSIS — E871 Hypo-osmolality and hyponatremia: Secondary | ICD-10-CM | POA: Diagnosis present

## 2012-02-25 DIAGNOSIS — Z87891 Personal history of nicotine dependence: Secondary | ICD-10-CM

## 2012-02-25 DIAGNOSIS — R079 Chest pain, unspecified: Secondary | ICD-10-CM

## 2012-02-25 DIAGNOSIS — D649 Anemia, unspecified: Secondary | ICD-10-CM

## 2012-02-25 DIAGNOSIS — E876 Hypokalemia: Secondary | ICD-10-CM | POA: Diagnosis not present

## 2012-02-25 DIAGNOSIS — K648 Other hemorrhoids: Secondary | ICD-10-CM | POA: Diagnosis present

## 2012-02-25 DIAGNOSIS — Z7902 Long term (current) use of antithrombotics/antiplatelets: Secondary | ICD-10-CM

## 2012-02-25 DIAGNOSIS — K219 Gastro-esophageal reflux disease without esophagitis: Secondary | ICD-10-CM | POA: Diagnosis present

## 2012-02-25 DIAGNOSIS — K921 Melena: Secondary | ICD-10-CM | POA: Diagnosis present

## 2012-02-25 DIAGNOSIS — D62 Acute posthemorrhagic anemia: Secondary | ICD-10-CM | POA: Diagnosis present

## 2012-02-25 DIAGNOSIS — D126 Benign neoplasm of colon, unspecified: Secondary | ICD-10-CM | POA: Diagnosis present

## 2012-02-25 DIAGNOSIS — D500 Iron deficiency anemia secondary to blood loss (chronic): Secondary | ICD-10-CM | POA: Diagnosis present

## 2012-02-25 DIAGNOSIS — Z531 Procedure and treatment not carried out because of patient's decision for reasons of belief and group pressure: Secondary | ICD-10-CM

## 2012-02-25 LAB — BASIC METABOLIC PANEL
BUN: 17 mg/dL (ref 6–23)
Chloride: 95 mEq/L — ABNORMAL LOW (ref 96–112)
GFR calc Af Amer: >90 mL/min (ref >90)
GFR calc non Af Amer: 80 mL/min — ABNORMAL LOW (ref >90)
Potassium: 4.1 mEq/L (ref 3.5–5.1)
Sodium: 133 mEq/L — ABNORMAL LOW (ref 135–145)

## 2012-02-25 LAB — CBC WITH DIFFERENTIAL/PLATELET
Basophils Relative: 0 % (ref 0–1)
Hemoglobin: 10.7 g/dL — ABNORMAL LOW (ref 12.0–15.0)
MCHC: 32.2 g/dL (ref 30.0–36.0)
Monocytes Relative: 4 % (ref 3–12)
Neutro Abs: 9.4 10*3/uL — ABNORMAL HIGH (ref 1.7–7.7)
Neutrophils Relative %: 86 % — ABNORMAL HIGH (ref 43–77)
Platelets: 247 10*3/uL (ref 150–400)
RBC: 4.07 MIL/uL (ref 3.87–5.11)

## 2012-02-25 LAB — SAMPLE TO BLOOD BANK

## 2012-02-25 LAB — HEMOGLOBIN AND HEMATOCRIT, BLOOD
HCT: 33.5 % — ABNORMAL LOW (ref 36.0–46.0)
HCT: 31.4 % — ABNORMAL LOW (ref 36.0–46.0)
Hemoglobin: 10.9 g/dL — ABNORMAL LOW (ref 12.0–15.0)
Hemoglobin: 9.8 g/dL — ABNORMAL LOW (ref 12.0–15.0)

## 2012-02-25 LAB — GLUCOSE, CAPILLARY: Glucose-Capillary: 236 mg/dL — ABNORMAL HIGH (ref 70–99)

## 2012-02-25 LAB — POCT PREGNANCY, URINE: Preg Test, Ur: POSITIVE — AB

## 2012-02-25 LAB — PROTIME-INR
INR: 1.05 (ref 0.00–1.49)
Prothrombin Time: 13.6 seconds (ref 11.6–15.2)

## 2012-02-25 LAB — OCCULT BLOOD, POC DEVICE: Fecal Occult Bld: POSITIVE

## 2012-02-25 LAB — TYPE AND SCREEN: Antibody Screen: NEGATIVE

## 2012-02-25 MED ORDER — INSULIN ASPART 100 UNIT/ML ~~LOC~~ SOLN
6.0000 [IU] | Freq: Once | SUBCUTANEOUS | Status: AC
  Administered 2012-02-25: 6 [IU] via INTRAVENOUS
  Filled 2012-02-25: qty 1

## 2012-02-25 MED ORDER — GABAPENTIN 100 MG PO CAPS
100.0000 mg | ORAL_CAPSULE | Freq: Three times a day (TID) | ORAL | Status: DC
  Administered 2012-02-26 – 2012-02-28 (×5): 100 mg via ORAL
  Filled 2012-02-25 (×10): qty 1

## 2012-02-25 MED ORDER — SODIUM CHLORIDE 0.9 % IV SOLN
1000.0000 mL | INTRAVENOUS | Status: AC
  Administered 2012-02-25 (×2): 1000 mL via INTRAVENOUS
  Administered 2012-02-26: 75 mL via INTRAVENOUS

## 2012-02-25 MED ORDER — SODIUM CHLORIDE 0.9 % IV SOLN
1000.0000 mL | INTRAVENOUS | Status: DC
  Administered 2012-02-25: 1000 mL via INTRAVENOUS

## 2012-02-25 MED ORDER — BENZONATATE 100 MG PO CAPS
200.0000 mg | ORAL_CAPSULE | Freq: Once | ORAL | Status: AC
  Administered 2012-02-25: 200 mg via ORAL
  Filled 2012-02-25: qty 2

## 2012-02-25 MED ORDER — POLYETHYLENE GLYCOL 3350 17 G PO PACK
17.0000 g | PACK | Freq: Every day | ORAL | Status: DC | PRN
  Filled 2012-02-25: qty 1

## 2012-02-25 MED ORDER — ONDANSETRON HCL 4 MG/2ML IJ SOLN: 4.0000 mg | Freq: Four times a day (QID) | INTRAMUSCULAR | Status: DC | PRN

## 2012-02-25 MED ORDER — ALBUTEROL SULFATE (5 MG/ML) 0.5% IN NEBU: 2.5000 mg | INHALATION_SOLUTION | RESPIRATORY_TRACT | Status: DC | PRN

## 2012-02-25 MED ORDER — NITROGLYCERIN 0.4 MG SL SUBL: 0.4000 mg | SUBLINGUAL_TABLET | SUBLINGUAL | Status: DC | PRN

## 2012-02-25 MED ORDER — LOSARTAN POTASSIUM 50 MG PO TABS
100.0000 mg | ORAL_TABLET | Freq: Every day | ORAL | Status: DC
  Administered 2012-02-28: 100 mg via ORAL
  Filled 2012-02-25 (×4): qty 2

## 2012-02-25 MED ORDER — CARVEDILOL 3.125 MG PO TABS
3.1250 mg | ORAL_TABLET | Freq: Two times a day (BID) | ORAL | Status: DC
  Administered 2012-02-26 – 2012-02-28 (×4): 3.125 mg via ORAL
  Filled 2012-02-25 (×7): qty 1

## 2012-02-25 MED ORDER — SODIUM CHLORIDE 0.9 % IV SOLN
8.0000 mg/h | INTRAVENOUS | Status: DC
  Administered 2012-02-25 – 2012-02-26 (×2): 8 mg/h via INTRAVENOUS
  Filled 2012-02-25 (×11): qty 80

## 2012-02-25 MED ORDER — HYDROCODONE-ACETAMINOPHEN 5-325 MG PO TABS: 1.0000 | ORAL_TABLET | ORAL | Status: DC | PRN

## 2012-02-25 MED ORDER — SIMVASTATIN 5 MG PO TABS
5.0000 mg | ORAL_TABLET | Freq: Every day | ORAL | Status: DC
  Administered 2012-02-26 – 2012-02-27 (×2): 5 mg via ORAL
  Filled 2012-02-25 (×4): qty 1

## 2012-02-25 MED ORDER — HYDROCOD POLST-CHLORPHEN POLST 10-8 MG/5ML PO LQCR
5.0000 mL | Freq: Once | ORAL | Status: AC | PRN
  Filled 2012-02-25: qty 5

## 2012-02-25 MED ORDER — INSULIN ASPART 100 UNIT/ML ~~LOC~~ SOLN
0.0000 [IU] | SUBCUTANEOUS | Status: DC
  Administered 2012-02-25: 2 [IU] via SUBCUTANEOUS
  Administered 2012-02-26 – 2012-02-27 (×5): 1 [IU] via SUBCUTANEOUS

## 2012-02-25 MED ORDER — ONDANSETRON HCL 4 MG PO TABS: 4.0000 mg | ORAL_TABLET | Freq: Four times a day (QID) | ORAL | Status: DC | PRN

## 2012-02-25 MED ORDER — LEVOTHYROXINE SODIUM 25 MCG PO TABS
25.0000 ug | ORAL_TABLET | Freq: Every day | ORAL | Status: DC
  Administered 2012-02-27 – 2012-02-28 (×2): 25 ug via ORAL
  Filled 2012-02-25 (×4): qty 1

## 2012-02-25 MED ORDER — SODIUM CHLORIDE 0.9 % IJ SOLN
3.0000 mL | Freq: Two times a day (BID) | INTRAMUSCULAR | Status: DC
  Administered 2012-02-26: 3 mL via INTRAVENOUS

## 2012-02-25 MED ORDER — SODIUM CHLORIDE 0.9 % IV SOLN
80.0000 mg | Freq: Once | INTRAVENOUS | Status: AC
  Administered 2012-02-25: 80 mg via INTRAVENOUS
  Filled 2012-02-25: qty 80

## 2012-02-25 MED ORDER — GUAIFENESIN-DM 100-10 MG/5ML PO SYRP
5.0000 mL | ORAL_SOLUTION | ORAL | Status: DC | PRN
  Administered 2012-02-25: 5 mL via ORAL
  Filled 2012-02-25: qty 5

## 2012-02-25 NOTE — H&P (Addendum)
[facility]                                                                                    Patient Demographics  Alyssa [name], is a 76 y.o. female  CSN: [id number]  MRN: [id number]  DOB - 13-May-1935  Admit Date - 02/25/2012  Outpatient Primary MD for the patient is Sherrie Mustache Nolon Bussing, MD   With History of -  Past Medical History  Diagnosis Date  . Diabetes mellitus   . Hypothyroid   . HLD (hyperlipidemia)   . CAD (coronary artery disease)     NSTEMI 05/2011 - LHC 06/04/11: LAD 99%, oD1 70% at LAD lesion, prox and mid CFX 30%, mOM1 70-80%, pRCA 40%, distal 40% with some collaterals to the LAD, EF 45-50% with apical dyskinesis and no thrombus.  PCI: Promus DES to the proximal LAD.;  Echo 05/27/11: LVH, EF 55-60%, grade 1 diastolic dysfunction.  [name] HTN (hypertension)       Past Surgical History  Procedure Date  . Abdominal hysterectomy   . Cardiac catheterization   . Tonsillectomy     in for   Chief Complaint  Patient presents with  . Loss of Consciousness     HPI  Alyssa [name]  is a 75 y.o. female, with history of CAD status post drug-eluting stent by Dr. Excell Seltzer in late February of this year, chronic diastolic dysfunction with EF around 60%, DM type II, and he and who had a dry cough for the last few days for which he was taking over-the-counter min medications, she was also taking some extra aspirin from time to time for headaches , this morning patient sat in the toilet for a bowel movement, she said she had large amount of blood in the toilet that made her dizzy and passed out for a few moments, she then presented to the ER where she was Hemoccult positive with dark stool, patient does have mild nausea but her BUN was normal, her head CT and EKG along with chest x-ray were stable, initial vital signs are stable house call to admit the patient for syncope, bright red blood along with melena later.    Review of Systems    In addition to the HPI above,  No  Fever-chills, No Headache, No changes with Vision or hearing, No problems swallowing food or Liquids, No Chest pain, Cough or Shortness of Breath, No Abdominal pain, + Nausea , No Vommitting,+ blood in stool No Blood in stool or Urine, No dysuria, No new skin rashes or bruises, No new joints pains-aches,  No new weakness, tingling, numbness in any extremity, No recent weight gain or loss, No polyuria, polydypsia or polyphagia, No significant Mental Stressors.  A full 10 point Review of Systems was done, except as stated above, all other Review of Systems were negative.   Social History History  Substance Use Topics  . Smoking status: Former Smoker    Quit date: 04/14/1968  . Smokeless tobacco: Never Used  . Alcohol Use: 1.2 oz/week    2 Glasses of wine per week      Family History No GI cancers  Prior to Admission medications  Medication Sig Start Date End Date Taking? Authorizing Provider  aspirin 81 MG chewable tablet Chew 81 mg by mouth daily. For pressure or chest pain   Yes Historical Provider, MD  carvedilol (COREG) 3.125 MG tablet Take 1 tablet (3.125 mg total) by mouth 2 (two) times daily with a meal. 02/03/12 02/02/13 Yes Bevelyn Buckles Bensimhon, MD  gabapentin (NEURONTIN) 100 MG capsule Take 100 mg by mouth 3 (three) times daily.   Yes Historical Provider, MD  levothyroxine (SYNTHROID, LEVOTHROID) 25 MCG tablet Take 25 mcg by mouth daily.   Yes Historical Provider, MD  losartan (COZAAR) 100 MG tablet Take 1 tablet (100 mg total) by mouth daily. 06/14/11 06/13/12 Yes Prescott Parma, PA  metFORMIN (GLUCOPHAGE-XR) 500 MG 24 hr tablet Take 500 mg by mouth 2 (two) times daily.   Yes Historical Provider, MD  pantoprazole (PROTONIX) 40 MG tablet Take 1 tablet (40 mg total) by mouth daily. 11/10/11 11/09/12 Yes Hadassah Pais, PA  polyethylene glycol (MIRALAX / GLYCOLAX) packet Take 17 g by mouth daily as needed. For constipation   Yes Historical Provider, MD  pravastatin (PRAVACHOL)  40 MG tablet Take 40 mg by mouth at bedtime.   Yes Historical Provider, MD  spironolactone (ALDACTONE) 50 MG tablet Take 25 mg by mouth daily.   Yes Historical Provider, MD  Ticagrelor (BRILINTA) 90 MG TABS tablet Take 1 tablet (90 mg total) by mouth 2 (two) times daily. 01/14/12  Yes Dolores Patty, MD  nitroGLYCERIN (NITROSTAT) 0.4 MG SL tablet Place 1 tablet (0.4 mg total) under the tongue every 5 (five) minutes as needed for chest pain. 06/14/11 06/13/12  Prescott Parma, PA    Allergies  Allergen Reactions  . Tetanus Toxoids Swelling and Rash    Physical Exam  Vitals  Blood pressure 145/59, pulse 82, temperature 98.8 F (37.1 C), temperature source Oral, resp. rate 18, SpO2 98.00%.   1. General elderly white female lying in bed in NAD,     2. Normal affect and insight, Not Suicidal or Homicidal, Awake Alert, Oriented X 3.  3. No F.N deficits, ALL C.Nerves Intact, Strength 5/5 all 4 extremities, Sensation intact all 4 extremities, Plantars down going.  4. Ears and Eyes appear Normal, Conjunctivae clear, PERRLA. Moist Oral Mucosa.  5. Supple Neck, No JVD, No cervical lymphadenopathy appriciated, No Carotid Bruits.  6. Symmetrical Chest wall movement, Good air movement bilaterally, CTAB.  7. RRR, No Gallops, Rubs or Murmurs, No Parasternal Heave.  8. Positive Bowel Sounds, Abdomen Soft, Non tender, No organomegaly appriciated,No rebound -guarding or rigidity.  9.  No Cyanosis, Normal Skin Turgor, No Skin Rash or Bruise.  10. Good muscle tone,  joints appear normal , no effusions, Normal ROM.  11. No Palpable Lymph Nodes in Neck or Axillae     Data Review  CBC  Lab 02/25/12 1237  WBC 11.0*  HGB 10.7*  HCT 33.2*  PLT 247  MCV 81.6  MCH 26.3  MCHC 32.2  RDW 14.7  LYMPHSABS 0.9  MONOABS 0.5  EOSABS 0.2  BASOSABS 0.0  BANDABS --   ------------------------------------------------------------------------------------------------------------------  Chemistries    Lab 02/25/12 1237  NA 133*  K 4.1  CL 95*  CO2 24  GLUCOSE 436*  BUN 17  CREATININE 0.78  CALCIUM 9.6  MG --  AST --  ALT --  ALKPHOS --  BILITOT --   ------------------------------------------------------------------------------------------------------------------ CrCl is unknown because both a height and weight (above a minimum accepted value) are required for this calculation. ------------------------------------------------------------------------------------------------------------------  No results found for this basename: TSH,T4TOTAL,FREET3,T3FREE,THYROIDAB in the last 72 hours   Coagulation profile  Lab 02/25/12 1237  INR 1.05  PROTIME --   ------------------------------------------------------------------------------------------------------------------- No results found for this basename: DDIMER:2 in the last 72 hours -------------------------------------------------------------------------------------------------------------------  Cardiac Enzymes No results found for this basename: CK:3,CKMB:3,TROPONINI:3,MYOGLOBIN:3 in the last 168 hours ------------------------------------------------------------------------------------------------------------------ No components found with this basename: POCBNP:3   Imaging results:   Ct Head Wo Contrast  02/25/2012  *RADIOLOGY REPORT*  Clinical Data: Loss of consciousness.  The patient had a syncopal episode while on the toilet.  She struck her head.  CT HEAD WITHOUT CONTRAST  Technique:  Contiguous axial images were obtained from the base of the skull through the vertex without contrast.  Comparison: None.  Findings: Lacunar infarcts of the basal ganglia appear remote. Mild periventricular white matter hypoattenuation is present bilaterally.  Mild generalized atrophy is present. No acute cortical infarct, hemorrhage, mass lesion is present.  The ventricles are proportionate to the degree of atrophy.  No significant  extra-axial fluid collection is present.  Chronic opacification of the right maxillary sinus is noted.  The paranasal sinuses are otherwise clear.  Mastoid air cells are clear.  Soft tissue swelling is present in the left paramidline posterior parietal scalp without an underlying fracture.  No significant intracranial hemorrhage is associated.  IMPRESSION:  1.  Bilateral lacunar infarcts of the basal ganglia appear remote. 2.  Mild atrophy and white matter disease likely reflects the sequelae of chronic microvascular ischemia. 3.  No acute intracranial abnormality. 4.  Chronic opacification of the right maxillary sinus.   Original Report Authenticated By: Marin Roberts, M.D.    Dg Chest Port 1 View  02/25/2012  *RADIOLOGY REPORT*  Clinical Data: Shortness of breath, loss of consciousness  PORTABLE CHEST - 1 VIEW  Comparison: 05/26/2011  Findings: Chronic interstitial markings/hyperinflation.  No focal consolidation.  No pleural effusion or pneumothorax.  Cardiomediastinal silhouette is within normal limits.  Upper thoracic levoscoliosis.  IMPRESSION: No evidence of acute cardiopulmonary disease.   Original Report Authenticated By: Charline Bills, M.D.     My personal review of EKG: Rhythm NSR,  no Acute ST changes    Assessment & Plan   1. Syncope due to acute GI bleed unclear whether this is upper versus lower, she had melanotic stool on exam he however says had some bright red blood earlier at home, she has some nausea however her BUN is stable - my guess is this is upper GI bleed since she has some upper GI symptoms and was taking some extra aspirin from time to time, patient will be admitted to step down, IV fluids, will type and screen her however she has informed me that she is Jehovah's Witness and will not take any transfusions even if she dies, she will be placed on IV PPI, her antiplatelet medications will be held, GI Deboraha Sprang has been informed to see the patient as soon as possible. He  will be kept on clear diet for now.  We'll continue to monitor her on telemetry, head CT stable, do not think any further syncopal workup is indicated.     2. History of CAD with drug-eluting stent by Dr. Excell Seltzer in late February of this year- I have requested cardiology to follow the patient closely as obviously she is within one year time frame of drug-eluting stent and 4 #1 problem above her antiplatelet medications are going to be held, we'll continue her beta blocker and other cardiac medications as  usual. She is currently stressed pain-free and baseline EKG stable.     3. History of chronic diastolic CHF, EF 16% echo this year- he is clinically compensated, for now gentle IV fluids monitor eyes and nose and monitor clinically.   4. Diabetes mellitus type 2. Since she is on clear liquid diet and may become n.p.o. later we'll hold her by mouth medications, we'll do every 4 hours Accu-Cheks with sliding scale insulin.     5. History of hypothyroidism. Home dose Synthroid will be continued.     DVT Prophylaxis   SCDs    AM Labs Ordered, also please review Full Orders  Family Communication: Admission, patients condition and plan of care including tests being ordered have been discussed with the patient and  who indicates understanding and agree with the plan and Code Status.  Code Status Full  Disposition Plan: Home  Time spent in minutes : 45  Condition GUARDED   Leroy Sea M.D on 02/25/2012 at 4:00 PM  Between 7am to 7pm - Pager - [phone]  After 7pm go to [email or web address] - password [email or web address]  And look for the night coverage person covering me after hours  [facility] Office  [phone]

## 2012-02-25 NOTE — ED Provider Notes (Signed)
History    CSN: [id number] Arrival date & time 02/25/12  1202 First MD Initiated Contact with Patient 02/25/12 1254     Chief Complaint  Patient presents with  . Loss of Consciousness   HPI The patient presented to the emergency room after having a syncopal episode this morning. She had been having trouble with a recent cough/URI starting last evening. She took some aspirin over-the-counter medications and was able to sleep well. She got up this morning and went to the bathroom when she had the sudden onset of extreme diaphoresis associated with severe weakness. The next thing she knew she had landed on the floor and had lost consciousness.  Her husband was able to assist her off the ground. She had hit her head on the toilet to she also had lost control of her bowels. Patient went and had a bowel movement where she did notice blood in the toilet following that episode. She denies any chest pain or shortness of breath. She does not have history of recurrent syncope.  Past Medical History  Diagnosis Date  . Diabetes mellitus   . Hypothyroid   . HLD (hyperlipidemia)   . CAD (coronary artery disease)     NSTEMI 05/2011 - LHC 06/04/11: LAD 99%, oD1 70% at LAD lesion, prox and mid CFX 30%, mOM1 70-80%, pRCA 40%, distal 40% with some collaterals to the LAD, EF 45-50% with apical dyskinesis and no thrombus.  PCI: Promus DES to the proximal LAD.;  Echo 05/27/11: LVH, EF 55-60%, grade 1 diastolic dysfunction.  [name] HTN (hypertension)     Past Surgical History  Procedure Date  . Abdominal hysterectomy   . Cardiac catheterization   . Tonsillectomy     History reviewed. No pertinent family history.  History  Substance Use Topics  . Smoking status: Former Smoker    Quit date: 04/14/1968  . Smokeless tobacco: Never Used  . Alcohol Use: 1.2 oz/week    2 Glasses of wine per week    OB History    Grav Para Term Preterm Abortions TAB SAB Ect Mult Living                  Review of Systems  All  other systems reviewed and are negative.    Allergies  Tetanus toxoids  Home Medications   Current Outpatient Rx  Name  Route  Sig  Dispense  Refill  . ASPIRIN 81 MG PO CHEW   Oral   Chew 81 mg by mouth daily. For pressure or chest pain         . CARVEDILOL 3.125 MG PO TABS   Oral   Take 1 tablet (3.125 mg total) by mouth 2 (two) times daily with a meal.   60 tablet   12   . LEVOTHYROXINE SODIUM 25 MCG PO TABS   Oral   Take 25 mcg by mouth daily.         [name] LOSARTAN POTASSIUM 100 MG PO TABS   Oral   Take 1 tablet (100 mg total) by mouth daily.   60 tablet   6   . METFORMIN HCL ER 500 MG PO TB24   Oral   Take 500 mg by mouth 2 (two) times daily.         [name] NITROGLYCERIN 0.4 MG SL SUBL   Sublingual   Place 1 tablet (0.4 mg total) under the tongue every 5 (five) minutes as needed for chest pain.   25 tablet   1   .  PANTOPRAZOLE SODIUM 40 MG PO TBEC   Oral   Take 1 tablet (40 mg total) by mouth daily.   30 tablet   3   . POLYETHYLENE GLYCOL 3350 PO PACK   Oral   Take 17 g by mouth daily as needed.         [name] PRAVASTATIN SODIUM 40 MG PO TABS   Oral   Take 40 mg by mouth at bedtime.         . SPIRONOLACTONE 50 MG PO TABS   Oral   Take 25 mg by mouth daily.         [name] TICAGRELOR 90 MG PO TABS   Oral   Take 1 tablet (90 mg total) by mouth 2 (two) times daily.   60 tablet   1     BP 112/66  Pulse 97  Temp 98.2 F (36.8 C) (Oral)  Resp 18  SpO2 98%  Physical Exam  Nursing note and vitals reviewed. Constitutional: She appears well-developed and well-nourished. No distress.       Cephalohematoma posteriorly  HENT:  Head: Normocephalic and atraumatic.  Right Ear: External ear normal.  Left Ear: External ear normal.  Eyes: Conjunctivae normal are normal. Right eye exhibits no discharge. Left eye exhibits no discharge. No scleral icterus.  Neck: Neck supple. No tracheal deviation present.  Cardiovascular: Normal rate, regular rhythm and  intact distal pulses.   Pulmonary/Chest: Effort normal and breath sounds normal. No stridor. No respiratory distress. She has no wheezes. She has no rales.  Abdominal: Soft. Bowel sounds are normal. She exhibits no distension. There is no tenderness. There is no rebound and no guarding.  Musculoskeletal: She exhibits no edema and no tenderness.  Neurological: She is alert. She has normal strength. No sensory deficit. Cranial nerve deficit:  no gross defecits noted. She exhibits normal muscle tone. She displays no seizure activity. Coordination normal.  Skin: Skin is warm and dry. No rash noted.  Psychiatric: She has a normal mood and affect.    ED Course  Procedures (including critical care time)  Labs Reviewed  CBC WITH DIFFERENTIAL - Abnormal; Notable for the following:    WBC 11.0 (*)     Hemoglobin 10.7 (*)     HCT 33.2 (*)     Neutrophils Relative 86 (*)     Neutro Abs 9.4 (*)     Lymphocytes Relative 8 (*)     All other components within normal limits  BASIC METABOLIC PANEL - Abnormal; Notable for the following:    Sodium 133 (*)     Chloride 95 (*)     Glucose, Bld 436 (*)     GFR calc non Af Amer 80 (*)     All other components within normal limits           All other components within normal limits  PROTIME-INR  SAMPLE TO BLOOD BANK  POCT I-STAT TROPONIN I  POCT I-STAT TROPONIN I  OCCULT BLOOD, POC DEVICE  POCT CBG (FASTING - GLUCOSE)-MANUAL ENTRY  HEMOGLOBIN AND HEMATOCRIT, BLOOD  HEMOGLOBIN AND HEMATOCRIT, BLOOD  HEMOGLOBIN AND HEMATOCRIT, BLOOD  TYPE AND SCREEN   Ct Head Wo Contrast  02/25/2012  *RADIOLOGY REPORT*  Clinical Data: Loss of consciousness.  The patient had a syncopal episode while on the toilet.  She struck her head.  CT HEAD WITHOUT CONTRAST  Technique:  Contiguous axial images were obtained from the base of the skull through the vertex without contrast.  Comparison: None.  Findings: Lacunar infarcts of the basal ganglia appear remote. Mild  periventricular white matter hypoattenuation is present bilaterally.  Mild generalized atrophy is present. No acute cortical infarct, hemorrhage, mass lesion is present.  The ventricles are proportionate to the degree of atrophy.  No significant extra-axial fluid collection is present.  Chronic opacification of the right maxillary sinus is noted.  The paranasal sinuses are otherwise clear.  Mastoid air cells are clear.  Soft tissue swelling is present in the left paramidline posterior parietal scalp without an underlying fracture.  No significant intracranial hemorrhage is associated.  IMPRESSION:  1.  Bilateral lacunar infarcts of the basal ganglia appear remote. 2.  Mild atrophy and white matter disease likely reflects the sequelae of chronic microvascular ischemia. 3.  No acute intracranial abnormality. 4.  Chronic opacification of the right maxillary sinus.   Original Report Authenticated By: Marin Roberts, M.D.    Dg Chest Port 1 View  02/25/2012  *RADIOLOGY REPORT*  Clinical Data: Shortness of breath, loss of consciousness  PORTABLE CHEST - 1 VIEW  Comparison: 05/26/2011  Findings: Chronic interstitial markings/hyperinflation.  No focal consolidation.  No pleural effusion or pneumothorax.  Cardiomediastinal silhouette is within normal limits.  Upper thoracic levoscoliosis.  IMPRESSION: No evidence of acute cardiopulmonary disease.   Original Report Authenticated By: Charline Bills, M.D.      1. Syncope   2. Anemia   3. Melena   4. Hyperglycemia       MDM  ** Note that patient did not have a pregnancy test ordered. She had a pointed care stool guaiac test. I've confirmed with the laboratory tach that the pointed care stool card was positive. We will attempt to correct this in the record  Patient presents to the emergency room with complaints of syncope. She does have black stool upon exam that was guaiac positive. She is only mildly anemic currently. She does have history of cardiac  disease and she does warrant further cardiac monitoring. I will consult with the medical service regarding admission and further evaluation  Case discussed with Dr Bosie Clos from gastroenterology who will consult  Celene Kras, MD 02/25/12 [id number]

## 2012-02-25 NOTE — Progress Notes (Signed)
Patient received to room 3311 from ED. Transferred without difficulty. Patient settled and oriented to room; Explained to patient and family members about safety and the fact that patient needs to call for assistance and that the bed alarm is on.

## 2012-02-25 NOTE — Consult Note (Signed)
CARDIOLOGY CONSULT NOTE   Patient ID: Alyssa [name] MRN: [id number] DOB/AGE: [birth year] 76 y.o.  Admit date: 02/25/2012  Primary Physician   Sherrie Mustache Nolon Bussing, MD Primary Cardiologist  DB  Reason for Consultation  GI bleed on Brilinta  [name] Willig is a 76 y.o. female with a history of CAD. She is a TEFL teacher witness. She had a NSTEMI in February and received a DES to the LAD. Her EF was preserved. Since then, she was evaluated for chest pain and had a low-risk Myoview, results listed below. She had a bloody BM today, associated with syncope, heme+ stool and melena. We were consulted to follow her for cardiac issues.  Pt was in usual state of health until 11/11 when she developed a headache. The headache resolved with 2 full-strength ASA. She developed a dry cough yesterday and had a sore throat. Today, she got up coughing, and became very weak, diaphoretic. She had a syncopal episode and fell, some occipital head trauma but no other injuries. Her husband was able to wake her up and she had a bowel movement followed by frank blood. She has also had melena today. She had no abdominal pain and no other history of melena. She is still coughing at times but otherwise feels OK now. She has never had anything like this before. Except for the ASA x 2 on Monday, she has not been taking extra NSAIDS. She has not had chest pain or SOB since her stress test in July. She has been compliant with meds including ASA 81 mg and Brilintal. Her blood sugars have been elevated, but otherwise she has been in her usual state of health.   Past Medical History  Diagnosis Date  . Diabetes mellitus   . Hypothyroid   . HLD (hyperlipidemia)   . CAD (coronary artery disease)     NSTEMI 05/2011 - LHC 06/04/11: LAD 99%, oD1 70% at LAD lesion, prox and mid CFX 30%, mOM1 70-80%, pRCA 40%, distal 40% with some collaterals to the LAD, EF 45-50% with apical dyskinesis and no thrombus.  PCI: Promus DES to the proximal LAD.;   Echo 05/27/11: LVH, EF 55-60%, grade 1 diastolic dysfunction.  [name] HTN (hypertension)      Past Surgical History  Procedure Date  . Abdominal hysterectomy   . Cardiac catheterization   . Tonsillectomy     Allergies  Allergen Reactions  . Tetanus Toxoids Swelling and Rash    I have reviewed the patient's current medications    . [COMPLETED] insulin aspart  6 Units Intravenous Once      . sodium chloride 1,000 mL (02/25/12 1636)  . [COMPLETED] pantoprazole (PROTONIX) IV 80 mg (02/25/12 1635)  . pantoprozole (PROTONIX) infusion    . [DISCONTINUED] sodium chloride Stopped (02/25/12 1636)     Medication Sig  aspirin 81 MG chewable tablet Chew 81 mg by mouth daily. For pressure or chest pain  carvedilol (COREG) 3.125 MG tablet Take 1 tablet (3.125 mg total) by mouth 2 times daily with a meal.  gabapentin (NEURONTIN) 100 MG capsule Take 100 mg by mouth 3 (three) times daily.  levothyroxine (SYNTHROID) 25 MCG tablet Take 25 mcg by mouth daily.  losartan (COZAAR) 100 MG tablet Take 1 tablet (100 mg total) by mouth daily.  metFORMIN 500 MG 24 hr tablet Take 500 mg by mouth 2 (two) times daily.  pantoprazole (PROTONIX) 40 MG tablet Take 1 tablet (40 mg total) by mouth daily.  polyethylene glycol (MIRALAX) packet Take 17  g by mouth daily as needed. For constipation  pravastatin (PRAVACHOL) 40 MG tablet Take 40 mg by mouth at bedtime.  spironolactone (ALDACTONE) 50 MG tablet Take 25 mg by mouth daily.  Ticagrelor (BRILINTA) 90 MG TABS tablet Take 1 tablet (90 mg total) by mouth 2 (two) times daily.  nitroGLYCERIN (NITROSTAT) 0.4 MG SL tablet Place 1 tablet (0.4 mg total) under the tongue every 5 (five) minutes as needed for chest pain.     History   Social History  . Marital Status: Married    Spouse Name: N/A    Number of Children: N/A  . Years of Education: N/A   Occupational History  . Retired housewife   Social History Main Topics  . Smoking status: Former Smoker     Quit date: 04/14/1968  . Smokeless tobacco: Never Used  . Alcohol Use: 1.2 oz/week    2 Glasses of wine per week  . Drug Use: No  . Sexually Active: Not Currently   Social History Narrative  . Pt lives with husband in [place], [place]    Family Status  Relation Status Death Age  . Mother Deceased 75    MI  . Father Deceased 78    No known CAD  . Brother Deceased     No known CAD  . Brother Deceased     No known CAD  . Brother Deceased     No known CAD  . Brother Deceased 48    MI  . Brother Deceased     No known CAD   ROS: No chest pain, no SOB, no fevers or chills. Full 14 point review of systems complete and found to be negative unless listed above.  Physical Exam: Blood pressure 115/63, pulse 81, temperature 98.8 F (37.1 C), temperature source Oral, resp. rate 25, SpO2 98.00%.  General: Well developed, well nourished, female in no acute distress Head: Eyes PERRLA, No xanthomas.   Normocephalic and atraumatic, oropharynx without edema or exudate. Dentition: good Lungs: clear bilaterally Heart: HRRR S1 S2, no rub/gallop, no significant murmur. pulses are 2+ all 4 extrem.   Neck: No carotid bruits. No lymphadenopathy.  JVD not elevated. Abdomen: Bowel sounds present, abdomen soft and non-tender without masses or hernias noted. Msk:  No spine or cva tenderness. No weakness, no joint deformities or effusions. Extremities: No clubbing or cyanosis. No edema.  Neuro: Alert and oriented X 3. No focal deficits noted. Psych:  Good affect, responds appropriately Skin: No rashes or lesions noted.  Labs:   Lab Results  Component Value Date   WBC 11.0* 02/25/2012   HGB 10.9* 02/25/2012   HCT 33.5* 02/25/2012   MCV 81.6 02/25/2012   PLT 247 02/25/2012    Basename 02/25/12 1237  INR 1.05     Lab 02/25/12 1237  NA 133*  K 4.1  CL 95*  CO2 24  BUN 17  CREATININE 0.78  CALCIUM 9.6  PROT --  BILITOT --  ALKPHOS --  ALT --  AST --  GLUCOSE 436*    Basename 02/25/12  1422 02/25/12 1318  TROPIPOC 0.00 0.00    Echo: 05/27/2011 Study Conclusions - Procedure narrative: Transthoracic echocardiography. The study was technically difficult, as a result of poor acoustic windows and poor sound wave transmission. - Left ventricle: The cavity size was normal. Wall thickness was increased in a pattern of moderate LVH. Systolic function was normal. The estimated ejection fraction was in the range of 55% to 60%. Doppler parameters are consistent with  abnormal left ventricular relaxation (grade 1 diastolic dysfunction). The E/e' ratio is >15, suggesting elevated LV filling pressure. - Left atrium: The atrium was at the upper limits of normal in size. - Tricuspid valve: No significant regurgitation. - Inferior vena cava: The vessel was normal in size; the respirophasic diameter changes were in the normal range (= 50%); findings are consistent with normal central venous pressure.  Myoview: Myoview 10/23/11: Overall low risk scan. LV Ejection Fraction: 74%.  NL LV Function; NL Wall Motion  ECG: 25-Feb-2012 12:31:29 [facility]/ED ROUTINE RECORD Normal sinus rhythm Low voltage QRS Borderline ECG 72mm/s 51mm/mV 100Hz  8.0.1 12SL 241 HD CID: 3 Referred by: Unconfirmed Vent. rate 81 BPM PR interval 170 ms QRS duration 82 ms QT/QTc 378/439 ms P-R-T axes 68 65 37  Radiology:  Ct Head Wo Contrast 02/25/2012  *RADIOLOGY REPORT*  Clinical Data: Loss of consciousness.  The patient had a syncopal episode while on the toilet.  She struck her head.  CT HEAD WITHOUT CONTRAST  Technique:  Contiguous axial images were obtained from the base of the skull through the vertex without contrast.  Comparison: None.  Findings: Lacunar infarcts of the basal ganglia appear remote. Mild periventricular white matter hypoattenuation is present bilaterally.  Mild generalized atrophy is present. No acute cortical infarct, hemorrhage, mass lesion is present.  The ventricles are  proportionate to the degree of atrophy.  No significant extra-axial fluid collection is present.  Chronic opacification of the right maxillary sinus is noted.  The paranasal sinuses are otherwise clear.  Mastoid air cells are clear.  Soft tissue swelling is present in the left paramidline posterior parietal scalp without an underlying fracture.  No significant intracranial hemorrhage is associated.  IMPRESSION:  1.  Bilateral lacunar infarcts of the basal ganglia appear remote. 2.  Mild atrophy and white matter disease likely reflects the sequelae of chronic microvascular ischemia. 3.  No acute intracranial abnormality. 4.  Chronic opacification of the right maxillary sinus.   Original Report Authenticated By: Marin Roberts, M.D.    Dg Chest Port 1 View 02/25/2012  *RADIOLOGY REPORT*  Clinical Data: Shortness of breath, loss of consciousness  PORTABLE CHEST - 1 VIEW  Comparison: 05/26/2011  Findings: Chronic interstitial markings/hyperinflation.  No focal consolidation.  No pleural effusion or pneumothorax.  Cardiomediastinal silhouette is within normal limits.  Upper thoracic levoscoliosis.  IMPRESSION: No evidence of acute cardiopulmonary disease.   Original Report Authenticated By: Charline Bills, M.D.     ASSESSMENT AND PLAN:   The patient was seen today by Dr [place], the patient evaluated and the data reviewed.  Coronary atherosclerosis of native coronary artery - pt has CAD but no recent ischemic symptoms and her Myoview in July was without significant abnormality. She is at acceptable risk for endoscopy or colonoscopy. With the bleeding, it is appropriate to hold her ASA/Brilinta, restart ASA when [facility] with GI, MD advise if OK to d/c Brilinta or if we should defer that decision until all test results are reviewed.  Otherwise, per primary MD and GI. Principal Problem:  *Melena Active Problems:  DM type 2 (diabetes mellitus, type 2)  Chr Diastolic, Ef 60% in 2013  Hyperlipemia  GERD  (gastroesophageal reflux disease)  Syncope   Signed: Theodore Demark 02/25/2012, 5:09 PM Co-Sign MD Patient seen and examined and history reviewed. Agree with above findings and plan. 76 yo WF with history of CAD s/p DES of LAD in 2/13. Follow up myoview in 7/13 OK. Now presents with syncopal  episode and GI bleed. Hemodynamically stable. Hgb 10.6. Exam as noted and edited as above. Ecg without ischemia. It is appropriate to hold dual antiplatelet therapy until cause and prognosis of GI bleed known. Will resume ASA when OK with GI. May want to wait longer before resuming Brilinta. Fortunately she is 9 months out from her stent so risk of stent thrombosis is not as high. We will follow with you.  Theron Arista [facility] 02/25/2012 5:44 PM

## 2012-02-25 NOTE — ED Notes (Signed)
CBG was 236. Notified Nurse Traci.

## 2012-02-25 NOTE — ED Notes (Signed)
Patient transported to CT 

## 2012-02-25 NOTE — Consult Note (Addendum)
Referring Provider: Dr. Thedore Mins Primary Care Physician:  [name], MD Primary Gastroenterologist:  Dr. Matthias Hughs  Reason for Consultation:  GI bleed  HPI: Alyssa [name] is a 76 y.o. female who had a syncopal episode and then was found to have red blood per rectum by her [name]. Passed out while sitting on the toilet. Prior to the syncopal episode she had been having a recurrent cough and sore throat and had taken cough meds. She is on Brilinta and Aspirin for her heart. Denies other NSAIDs. In ER she was found to have black stools by the ER doctor. Mild nausea without vomiting. Denies abdominal pains. EGD in 2011 that showed bile reflux. Colonoscopy in 2010 that showed sigmoid diverticulosis. [name] and youngest daughter at bedside.     Past Medical History  Diagnosis Date  . Diabetes mellitus   . Hypothyroid   . HLD (hyperlipidemia)   . CAD (coronary artery disease)     NSTEMI 05/2011 - LHC 06/04/11: LAD 99%, oD1 70% at LAD lesion, prox and mid CFX 30%, mOM1 70-80%, pRCA 40%, distal 40% with some collaterals to the LAD, EF 45-50% with apical dyskinesis and no thrombus.  PCI: Promus DES to the proximal LAD.;  Echo 05/27/11: LVH, EF 55-60%, grade 1 diastolic dysfunction.  [name] HTN (hypertension)     Past Surgical History  Procedure Date  . Abdominal hysterectomy   . Cardiac catheterization   . Tonsillectomy     Prior to Admission medications   Medication Sig Start Date End Date Taking? Authorizing Provider  aspirin 81 MG chewable tablet Chew 81 mg by mouth daily. For pressure or chest pain   Yes Historical Provider, MD  carvedilol (COREG) 3.125 MG tablet Take 1 tablet (3.125 mg total) by mouth 2 (two) times daily with a meal. 02/03/12 02/02/13 Yes Bevelyn Buckles Bensimhon, MD  gabapentin (NEURONTIN) 100 MG capsule Take 100 mg by mouth 3 (three) times daily.   Yes Historical Provider, MD  levothyroxine (SYNTHROID, LEVOTHROID) 25 MCG tablet Take 25 mcg by mouth daily.   Yes Historical  Provider, MD  losartan (COZAAR) 100 MG tablet Take 1 tablet (100 mg total) by mouth daily. 06/14/11 06/13/12 Yes Prescott Parma, PA  metFORMIN (GLUCOPHAGE-XR) 500 MG 24 hr tablet Take 500 mg by mouth 2 (two) times daily.   Yes Historical Provider, MD  pantoprazole (PROTONIX) 40 MG tablet Take 1 tablet (40 mg total) by mouth daily. 11/10/11 11/09/12 Yes Hadassah Pais, PA  polyethylene glycol (MIRALAX / GLYCOLAX) packet Take 17 g by mouth daily as needed. For constipation   Yes Historical Provider, MD  pravastatin (PRAVACHOL) 40 MG tablet Take 40 mg by mouth at bedtime.   Yes Historical Provider, MD  spironolactone (ALDACTONE) 50 MG tablet Take 25 mg by mouth daily.   Yes Historical Provider, MD  Ticagrelor (BRILINTA) 90 MG TABS tablet Take 1 tablet (90 mg total) by mouth 2 (two) times daily. 01/14/12  Yes Dolores Patty, MD  nitroGLYCERIN (NITROSTAT) 0.4 MG SL tablet Place 1 tablet (0.4 mg total) under the tongue every 5 (five) minutes as needed for chest pain. 06/14/11 06/13/12  Prescott Parma, PA    Scheduled Meds:   . [COMPLETED] insulin aspart  6 Units Intravenous Once   Continuous Infusions:   . sodium chloride 1,000 mL (02/25/12 1636)  . [COMPLETED] pantoprazole (PROTONIX) IV 80 mg (02/25/12 1635)  . pantoprozole (PROTONIX) infusion    . [DISCONTINUED] sodium chloride Stopped (02/25/12 1636)   PRN Meds:.  Allergies as of 02/25/2012 - Review Complete 02/25/2012  Allergen Reaction Noted  . Tetanus toxoids Swelling and Rash 05/26/2011    History reviewed. No pertinent family history.  History   Social History  . Marital Status: Married    Spouse Name: N/A    Number of Children: N/A  . Years of Education: N/A   Occupational History  . Not on file.   Social History Main Topics  . Smoking status: Former Smoker    Quit date: 04/14/1968  . Smokeless tobacco: Never Used  . Alcohol Use: 1.2 oz/week    2 Glasses of wine per week  . Drug Use: No  . Sexually Active: Not Currently    Other Topics Concern  . Not on file   Social History Narrative  . No narrative on file    Review of Systems: All negative from GI standpoint except as stated above in HPI.  Physical Exam: Vital signs: Filed Vitals:   02/25/12 1630  BP: 115/63  Pulse: 81  Temp: 98.8  Resp: 25     General:   Elderly, Alert,  Well-developed, well-nourished, pleasant and cooperative in NAD HEENT: anicteric Neck: supple, nontender Lungs:  Clear throughout to auscultation.   No wheezes, crackles, or rhonchi. No acute distress. Heart:  Regular rate and rhythm; no murmurs, clicks, rubs,  or gallops. Abdomen: soft, NT, ND, +BS  Rectal:  Deferred Ext: no edema  GI:  Lab Results:  Basename 02/25/12 1548 02/25/12 1237  WBC -- 11.0*  HGB 10.9* 10.7*  HCT 33.5* 33.2*  PLT -- 247   BMET  Basename 02/25/12 1237  NA 133*  K 4.1  CL 95*  CO2 24  GLUCOSE 436*  BUN 17  CREATININE 0.78  CALCIUM 9.6   LFT No results found for this basename: PROT,ALBUMIN,AST,ALT,ALKPHOS,BILITOT,BILIDIR,IBILI in the last 72 hours PT/INR  Basename 02/25/12 1237  LABPROT 13.6  INR 1.05     Studies/Results: Ct Head Wo Contrast  02/25/2012  *RADIOLOGY REPORT*  Clinical Data: Loss of consciousness.  The patient had a syncopal episode while on the toilet.  She struck her head.  CT HEAD WITHOUT CONTRAST  Technique:  Contiguous axial images were obtained from the base of the skull through the vertex without contrast.  Comparison: None.  Findings: Lacunar infarcts of the basal ganglia appear remote. Mild periventricular white matter hypoattenuation is present bilaterally.  Mild generalized atrophy is present. No acute cortical infarct, hemorrhage, mass lesion is present.  The ventricles are proportionate to the degree of atrophy.  No significant extra-axial fluid collection is present.  Chronic opacification of the right maxillary sinus is noted.  The paranasal sinuses are otherwise clear.  Mastoid air cells are  clear.  Soft tissue swelling is present in the left paramidline posterior parietal scalp without an underlying fracture.  No significant intracranial hemorrhage is associated.  IMPRESSION:  1.  Bilateral lacunar infarcts of the basal ganglia appear remote. 2.  Mild atrophy and white matter disease likely reflects the sequelae of chronic microvascular ischemia. 3.  No acute intracranial abnormality. 4.  Chronic opacification of the right maxillary sinus.   Original Report Authenticated By: Marin Roberts, M.D.    Dg Chest Port 1 View  02/25/2012  *RADIOLOGY REPORT*  Clinical Data: Shortness of breath, loss of consciousness  PORTABLE CHEST - 1 VIEW  Comparison: 05/26/2011  Findings: Chronic interstitial markings/hyperinflation.  No focal consolidation.  No pleural effusion or pneumothorax.  Cardiomediastinal silhouette is within normal limits.  Upper thoracic levoscoliosis.  IMPRESSION: No evidence of acute cardiopulmonary disease.   Original Report Authenticated By: Charline Bills, M.D.     Impression/Plan: 76yo with syncopal episode with GI bleed likely upper in origin with black stools in ER although [name] and patient report seeing red blood after passing out spell. No vomiting or abdominal pain. On anticoagulation with Brilinta and Aspirin. Needs EGD to look for peptic ulcer and that is scheduled for tomorrow morning. PPI infusion started by primary team. Ok to continue but if EGD unrevealing then will put on IV Q 12 hours. Clears. NPO p MN. If EGD unrevealing then may need repeat colonoscopy while she is in the hospital.    LOS: 0 days   Bay Wayson C.  02/25/2012, 5:00 PM

## 2012-02-25 NOTE — ED Notes (Signed)
Pt c/o syncopal episode in bathroom today; pt sts became diaphoretic when in bathroom and had syncopal episode; pt sts hit head on toilet; pt sts BM with bright red blood after event; pt denies CP or SOB

## 2012-02-26 ENCOUNTER — Encounter (HOSPITAL_COMMUNITY)
Admission: EM | Disposition: A | Discharge status: Home Health | Payer: Self-pay | Source: Home / Self Care | Attending: Internal Medicine

## 2012-02-26 ENCOUNTER — Encounter (HOSPITAL_COMMUNITY): Payer: Self-pay | Admitting: *Deleted

## 2012-02-26 DIAGNOSIS — D500 Iron deficiency anemia secondary to blood loss (chronic): Secondary | ICD-10-CM | POA: Diagnosis present

## 2012-02-26 HISTORY — PX: ESOPHAGOGASTRODUODENOSCOPY: SHX5428

## 2012-02-26 LAB — GLUCOSE, CAPILLARY
Glucose-Capillary: 147 mg/dL — ABNORMAL HIGH (ref 70–99)
Glucose-Capillary: 149 mg/dL — ABNORMAL HIGH (ref 70–99)
Glucose-Capillary: 137 mg/dL — ABNORMAL HIGH (ref 70–99)

## 2012-02-26 LAB — HEMOGLOBIN A1C
Hgb A1c MFr Bld: 7.7 % — ABNORMAL HIGH (ref <5.7)
Mean Plasma Glucose: 174 mg/dL — ABNORMAL HIGH (ref <117)

## 2012-02-26 LAB — BASIC METABOLIC PANEL
BUN: 9 mg/dL (ref 6–23)
Calcium: 8.4 mg/dL (ref 8.4–10.5)
Creatinine, Ser: 0.73 mg/dL (ref 0.50–1.10)
GFR calc Af Amer: >90 mL/min (ref >90)
GFR calc non Af Amer: 82 mL/min — ABNORMAL LOW (ref >90)

## 2012-02-26 LAB — CBC
HCT: 29.8 % — ABNORMAL LOW (ref 36.0–46.0)
MCHC: 32.2 g/dL (ref 30.0–36.0)
Platelets: 197 10*3/uL (ref 150–400)
RDW: 14.6 % (ref 11.5–15.5)

## 2012-02-26 LAB — RETICULOCYTES
RBC.: 3.65 MIL/uL — ABNORMAL LOW (ref 3.87–5.11)
Retic Count, Absolute: 58.4 10*3/uL (ref 19.0–186.0)
Retic Ct Pct: 1.6 % (ref 0.4–3.1)

## 2012-02-26 SURGERY — EGD (ESOPHAGOGASTRODUODENOSCOPY)
Anesthesia: Moderate Sedation

## 2012-02-26 MED ORDER — WHITE PETROLATUM GEL
Status: AC
  Administered 2012-02-26: 16:00:00
  Filled 2012-02-26: qty 5

## 2012-02-26 MED ORDER — FENTANYL CITRATE 0.05 MG/ML IJ SOLN
INTRAMUSCULAR | Status: DC | PRN
  Administered 2012-02-26: 15 ug via INTRAVENOUS
  Administered 2012-02-26: 25 ug via INTRAVENOUS

## 2012-02-26 MED ORDER — GUAIFENESIN ER 600 MG PO TB12
600.0000 mg | ORAL_TABLET | Freq: Two times a day (BID) | ORAL | Status: DC
  Administered 2012-02-26 – 2012-02-28 (×3): 600 mg via ORAL
  Filled 2012-02-26 (×5): qty 1

## 2012-02-26 MED ORDER — DEXTROMETHORPHAN POLISTIREX 30 MG/5ML PO LQCR
30.0000 mg | Freq: Two times a day (BID) | ORAL | Status: DC
  Filled 2012-02-26: qty 5

## 2012-02-26 MED ORDER — SODIUM CHLORIDE 0.9 % IV SOLN: INTRAVENOUS | Status: DC

## 2012-02-26 MED ORDER — FENTANYL CITRATE 0.05 MG/ML IJ SOLN
INTRAMUSCULAR | Status: AC
  Filled 2012-02-26: qty 2

## 2012-02-26 MED ORDER — BUTAMBEN-TETRACAINE-BENZOCAINE 2-2-14 % EX AERO
INHALATION_SPRAY | CUTANEOUS | Status: DC | PRN
  Administered 2012-02-26: 2 via TOPICAL

## 2012-02-26 MED ORDER — PEG 3350-KCL-NA BICARB-NACL 420 G PO SOLR
4000.0000 mL | Freq: Once | ORAL | Status: AC
  Administered 2012-02-26: 4000 mL via ORAL
  Filled 2012-02-26 (×2): qty 4000

## 2012-02-26 MED ORDER — HYDROCOD POLST-CHLORPHEN POLST 10-8 MG/5ML PO LQCR
5.0000 mL | Freq: Once | ORAL | Status: AC | PRN
  Administered 2012-02-26: 5 mL via ORAL

## 2012-02-26 MED ORDER — MIDAZOLAM HCL 10 MG/2ML IJ SOLN
INTRAMUSCULAR | Status: DC | PRN
  Administered 2012-02-26: 2 mg via INTRAVENOUS

## 2012-02-26 MED ORDER — DEXTROMETHORPHAN POLISTIREX 30 MG/5ML PO LQCR
30.0000 mg | Freq: Two times a day (BID) | ORAL | Status: DC
  Administered 2012-02-26 – 2012-02-28 (×3): 30 mg via ORAL
  Filled 2012-02-26 (×5): qty 5

## 2012-02-26 MED ORDER — MIDAZOLAM HCL 5 MG/ML IJ SOLN
INTRAMUSCULAR | Status: AC
  Filled 2012-02-26: qty 2

## 2012-02-26 NOTE — Progress Notes (Signed)
Have begun bowel prep., patient has had some periods where she suddenly begins to shake almost uncontrolling. Results are beginning to clear up alittle.

## 2012-02-26 NOTE — Progress Notes (Signed)
Utilization review completed.  

## 2012-02-26 NOTE — Progress Notes (Signed)
[facility] PROGRESS NOTE  Jenita P Certain MRN:[id number] DOB: 05/27/1935 DOA: 02/25/2012 PCP: Fisher, Michael J, MD  Assessment/Plan: Principal Problem:  *Melena Resolved for now- EGD reveals gastritis- cont protonix infusion per GI- cont clears- for colonoscopy in AM.   Active Problems:  Anemia of acute blood loss Hbg was 12/13 prior to bleed and now 9/10.  Due to being a [employer], pt declines blood, obtain anemia panel. If retic count low, will need to order aranesp.    DM type 2 (diabetes mellitus, type 2) Cont sliding scale for now   CHF Diastolic - grade 1, Ef 60% in 2013 Slow IVF   Coronary atherosclerosis of native coronary artery  Hyperlipemia   GERD (gastroesophageal reflux disease) As above- cont Protonix   Syncope Due to acute blood loss   Code Status: full code Family Communication: with husband Disposition Plan: follow in SDU DVT prophylaxis: SCDs   Brief narrative: Alyssa [name] is a 76 y.o. female, with history of CAD status post drug-eluting stent by Dr. Cooper in late February of this year, chronic diastolic dysfunction with EF around 60%, DM type II, and he and who had a dry cough for the last few days for which he was taking over-the-counter min medications, she was also taking some extra aspirin from time to time for headaches , this morning patient sat in the toilet for a bowel movement, she said she had large amount of blood in the toilet that made her dizzy and passed out for a few moments, she then presented to the ER where she was Hemoccult positive with dark stool, patient does have mild nausea but her BUN was normal, her head CT and EKG along with chest x-ray were stable, initial vital signs are stable house call to admit the patient for syncope, bright red blood along with melena later.   Consultants:  GI   Procedures:  EGD today  Antibiotics:  none  HPI/Subjective: No specific complaints- explained to her the finding of  gastritis on EGD and the need to avoid NSAIDS.   Objective: Filed Vitals:   02/26/12 0900 02/26/12 0910 02/26/12 0930 02/26/12 1156  BP: 110/42 110/45  114/47  Pulse:      Temp:   99.4 F (37.4 C) 99.4 F (37.4 C)  TempSrc:   Oral Oral  Resp: 22 25    Height:      Weight:      SpO2: 95% 95%      Intake/Output Summary (Last 24 hours) at 02/26/12 1657 Last data filed at 02/26/12 0808  Gross per 24 hour  Intake 173.75 ml  Output   1850 ml  Net -1676.25 ml    Exam:   General:  Alert, no acute distress, oriented x 3  Cardiovascular: RRR, no murmur  Respiratory: CTA b/l  Abdomen: soft, NT, ND, BS+  Ext:no c/c/e  Data Reviewed: Basic Metabolic Panel:  Lab 02/26/12 0454 02/25/12 1237  NA 134* 133*  K 3.6 4.1  CL 102 95*  CO2 23 24  GLUCOSE 158* 436*  BUN 9 17  CREATININE 0.73 0.78  CALCIUM 8.4 9.6  MG -- --  PHOS -- --   Liver Function Tests: No results found for this basename: AST:5,ALT:5,ALKPHOS:5,BILITOT:5,PROT:5,ALBUMIN:5 in the last 168 hours No results found for this basename: LIPASE:5,AMYLASE:5 in the last 168 hours No results found for this basename: AMMONIA:5 in the last 168 hours CBC:  Lab 02/26/12 0454 02/25/12 2325 02/25/12 2010 02/25/12 1548 02/25/12 1237  WBC 8.0 -- -- --   11.0*  NEUTROABS -- -- -- -- 9.4*  HGB 9.6* 9.8* 10.3* 10.9* 10.7*  HCT 29.8* 29.6* 31.4* 33.5* 33.2*  MCV 80.5 -- -- -- 81.6  PLT 197 -- -- -- 247   Cardiac Enzymes: No results found for this basename: CKTOTAL:5,CKMB:5,CKMBINDEX:5,TROPONINI:5 in the last 168 hours BNP (last 3 results)  Basename 05/26/11 2115  PROBNP 611.5*   CBG:  Lab 02/26/12 1136 02/26/12 0931 02/26/12 0325 02/25/12 2332 02/25/12 2010  GLUCAP 149* 158* 147* 127* 196*    Recent Results (from the past 240 hour(s))  MRSA PCR SCREENING     Status: Normal   Collection Time   02/25/12  5:44 PM      Component Value Range Status Comment   MRSA by PCR NEGATIVE  NEGATIVE Final      Studies: Ct  Head Wo Contrast  02/25/2012  *RADIOLOGY REPORT*  Clinical Data: Loss of consciousness.  The patient had a syncopal episode while on the toilet.  She struck her head.  CT HEAD WITHOUT CONTRAST  Technique:  Contiguous axial images were obtained from the base of the skull through the vertex without contrast.  Comparison: None.  Findings: Lacunar infarcts of the basal ganglia appear remote. Mild periventricular white matter hypoattenuation is present bilaterally.  Mild generalized atrophy is present. No acute cortical infarct, hemorrhage, mass lesion is present.  The ventricles are proportionate to the degree of atrophy.  No significant extra-axial fluid collection is present.  Chronic opacification of the right maxillary sinus is noted.  The paranasal sinuses are otherwise clear.  Mastoid air cells are clear.  Soft tissue swelling is present in the left paramidline posterior parietal scalp without an underlying fracture.  No significant intracranial hemorrhage is associated.  IMPRESSION:  1.  Bilateral lacunar infarcts of the basal ganglia appear remote. 2.  Mild atrophy and white matter disease likely reflects the sequelae of chronic microvascular ischemia. 3.  No acute intracranial abnormality. 4.  Chronic opacification of the right maxillary sinus.   Original Report Authenticated By: Christopher Mattern, M.D.    Dg Chest Port 1 View  02/25/2012  *RADIOLOGY REPORT*  Clinical Data: Shortness of breath, loss of consciousness  PORTABLE CHEST - 1 VIEW  Comparison: 05/26/2011  Findings: Chronic interstitial markings/hyperinflation.  No focal consolidation.  No pleural effusion or pneumothorax.  Cardiomediastinal silhouette is within normal limits.  Upper thoracic levoscoliosis.  IMPRESSION: No evidence of acute cardiopulmonary disease.   Original Report Authenticated By: Sriyesh Krishnan, M.D.     Scheduled Meds:   . [COMPLETED] benzonatate  200 mg Oral Once  . carvedilol  3.125 mg Oral BID WC  .  dextromethorphan  30 mg Oral BID  . gabapentin  100 mg Oral TID  . guaiFENesin  600 mg Oral BID  . insulin aspart  0-9 Units Subcutaneous Q4H  . levothyroxine  25 mcg Oral QAC breakfast  . losartan  100 mg Oral Daily  . polyethylene glycol-electrolytes  4,000 mL Oral Once  . simvastatin  5 mg Oral q1800  . sodium chloride  3 mL Intravenous Q12H  . white petrolatum       Continuous Infusions:   . [EXPIRED] sodium chloride 1,000 mL (02/26/12 1500)  . sodium chloride    . pantoprozole (PROTONIX) infusion 8 mg/hr (02/26/12 0500)    ________________________________________________________________________  Time spent: 35 min    Ellisyn Icenhower  [facility] Pager [phone] If 8PM-8AM, please contact night-coverage at [email or web address], password [email or web address] 02/26/2012, 4:57 PM  LOS: 1 day     

## 2012-02-26 NOTE — Progress Notes (Signed)
Pt finished bowel prep.  BM watery, no signs of blood.  Consent signed for colonoscopy tomorrow.    -Maximino Greenland RN

## 2012-02-26 NOTE — Op Note (Signed)
[facility] [address] [place], [address]   ENDOSCOPY PROCEDURE REPORT  PATIENT: Alyssa, [name]  MR#: [id number] BIRTHDATE: 1935/12/02 , 76  yrs. old GENDER: Female  ENDOSCOPIST: Charlott Rakes, MD REFERRED [id number] team  PROCEDURE DATE:  02/26/2012 PROCEDURE:   EGD, diagnostic ASA CLASS:   Class III INDICATIONS:melena. MEDICATIONS: Fentanyl-Detailed 40 mcg IV, Versed 2 mg IV, and Cetacaine spray x 2  TOPICAL ANESTHETIC:  DESCRIPTION OF PROCEDURE:   After the risks benefits and alternatives of the procedure were thoroughly explained, informed consent was obtained.  The Pentax Gastroscope [id number]  endoscope was introduced through the mouth and advanced to the second portion of the duodenum , limited by Without limitations.   The instrument was slowly withdrawn as the mucosa was fully examined.     FINDINGS: The endoscope was inserted into the oropharynx and esophagus was intubated.  The esophagus was normal in appearance. The gastroesophageal junction was noted to be 40 cm from the incisors. Endoscope was advanced into the stomach, which revealed clear bilious fluid and minimal erythema in the antrum. Stomach otherwise normal in appearance. The endoscope was advanced to the duodenal bulb and second portion of duodenum which were unremarkable.  The endoscope was withdrawn back into the stomach and retroflexion revealed a normal proximal stomach.  COMPLICATIONS: None  ENDOSCOPIC IMPRESSION:     Minimal antral gastritis otherwise normal EGD; No bleeding seen  RECOMMENDATIONS: Inpt colonoscopy   REPEAT EXAM: N/A  _______________________________ Charlott Rakes, MD eSigned:  Charlott Rakes, MD 02/26/2012 8:37 AM    CC:  PATIENT NAME:  Alyssa, [name] MR#: [id number]

## 2012-02-26 NOTE — Progress Notes (Signed)
Patient goiong down to Endo for a EGD. Ambulated to stretcher.

## 2012-02-26 NOTE — Brief Op Note (Signed)
See endopro. Minimal gastritis on EGD. No source of bleeding. Colonoscopy tomorrow with prep today.

## 2012-02-26 NOTE — Progress Notes (Signed)
Advanced Heart Failure Rounding Note   Subjective:    Alyssa [name] is a 76 y.o. Jehovah Witness with past medical history pertinent for DM2, hypothyroidism and CAD s/p Promus DES to LAD by Dr. Excell Seltzer on 06/12/11. Myoview 10/23/11: Overall low risk scan. LV Ejection Fraction: 74%. LV Wall Motion: NL LV Function; NL Wall Motion  Admitted 11/13 with syncopal episode that was precipitated by diaphoresis but no chest pain as well as 2 days of cold like symptoms.  She has taken 2 ASA 325 mg on two separate occasions this week as well as cough medicine OTC.  She then went and had a BM with +bright red blood.  In ER heme+stool and melena.  Hgb 10.6.  Currently Brilinta and ASA on hold.  Troponin negative.  EGD 11/14 by Dr. Bosie Clos: minimal gastritis but no source of bleeding. Plan for colonoscopy tomorrow.  Currently no complaints of chest pain, orthopnea, PND.  No further syncope or BRBPR.   Objective:   Weight Range:  Vital Signs:   Temp:  [98.2 F (36.8 C)-100.3 F (37.9 C)] 99.4 F (37.4 C) (11/14 0845) Pulse Rate:  [80-97] 89  (11/14 0800) Resp:  [15-25] 25  (11/14 0910) BP: (99-145)/(38-72) 110/45 mmHg (11/14 0910) SpO2:  [95 %-100 %] 95 % (11/14 0910) Weight:  [72 kg (158 lb 11.7 oz)-73.3 kg (161 lb 9.6 oz)] 73.3 kg (161 lb 9.6 oz) (11/14 0414) Last BM Date: 02/25/12  Weight change: Filed Weights   02/25/12 1730 02/26/12 0414  Weight: 72 kg (158 lb 11.7 oz) 73.3 kg (161 lb 9.6 oz)    Intake/Output:   Intake/Output Summary (Last 24 hours) at 02/26/12 0946 Last data filed at 02/26/12 0808  Gross per 24 hour  Intake 173.75 ml  Output   1850 ml  Net -1676.25 ml     Physical Exam: General: Well appearing. No respiratory difficulty  HEENT: normal  Neck: supple. no JVD. Carotids 2+ bilat; no bruits. No lymphadenopathy or thryomegaly appreciated.  Cor: PMI nondisplaced. Regular rate & rhythm. No rubs, gallops or murmurs.  Lungs: clear  Abdomen: obese soft, nontender,  nondistended. No hepatosplenomegaly. No bruits or masses. Good bowel sounds.  Extremities: no cyanosis, clubbing, rash, edema  Neuro: alert & oriented x 3, cranial nerves grossly intact. moves all 4 extremities w/o difficulty. Affect pleasant.   Telemetry: NSR 70-90s  Labs: Basic Metabolic Panel:  Lab 02/26/12 4098 02/25/12 1237  NA 134* 133*  K 3.6 4.1  CL 102 95*  CO2 23 24  GLUCOSE 158* 436*  BUN 9 17  CREATININE 0.73 0.78  CALCIUM 8.4 9.6  MG -- --  PHOS -- --    Liver Function Tests: No results found for this basename: AST:5,ALT:5,ALKPHOS:5,BILITOT:5,PROT:5,ALBUMIN:5 in the last 168 hours No results found for this basename: LIPASE:5,AMYLASE:5 in the last 168 hours No results found for this basename: AMMONIA:3 in the last 168 hours  CBC:  Lab 02/26/12 0454 02/25/12 2325 02/25/12 2010 02/25/12 1548 02/25/12 1237  WBC 8.0 -- -- -- 11.0*  NEUTROABS -- -- -- -- 9.4*  HGB 9.6* 9.8* 10.3* 10.9* 10.7*  HCT 29.8* 29.6* 31.4* 33.5* 33.2*  MCV 80.5 -- -- -- 81.6  PLT 197 -- -- -- 247    Cardiac Enzymes: No results found for this basename: CKTOTAL:5,CKMB:5,CKMBINDEX:5,TROPONINI:5 in the last 168 hours  BNP: BNP (last 3 results)  Basename 05/26/11 2115  PROBNP 611.5*     Other results:  EKG: NSR 86, low voltage   Imaging: Ct Head  Wo Contrast  02/25/2012  *RADIOLOGY REPORT*  Clinical Data: Loss of consciousness.  The patient had a syncopal episode while on the toilet.  She struck her head.  CT HEAD WITHOUT CONTRAST  Technique:  Contiguous axial images were obtained from the base of the skull through the vertex without contrast.  Comparison: None.  Findings: Lacunar infarcts of the basal ganglia appear remote. Mild periventricular white matter hypoattenuation is present bilaterally.  Mild generalized atrophy is present. No acute cortical infarct, hemorrhage, mass lesion is present.  The ventricles are proportionate to the degree of atrophy.  No significant extra-axial  fluid collection is present.  Chronic opacification of the right maxillary sinus is noted.  The paranasal sinuses are otherwise clear.  Mastoid air cells are clear.  Soft tissue swelling is present in the left paramidline posterior parietal scalp without an underlying fracture.  No significant intracranial hemorrhage is associated.  IMPRESSION:  1.  Bilateral lacunar infarcts of the basal ganglia appear remote. 2.  Mild atrophy and white matter disease likely reflects the sequelae of chronic microvascular ischemia. 3.  No acute intracranial abnormality. 4.  Chronic opacification of the right maxillary sinus.   Original Report Authenticated By: Marin Roberts, M.D.    Dg Chest Port 1 View  02/25/2012  *RADIOLOGY REPORT*  Clinical Data: Shortness of breath, loss of consciousness  PORTABLE CHEST - 1 VIEW  Comparison: 05/26/2011  Findings: Chronic interstitial markings/hyperinflation.  No focal consolidation.  No pleural effusion or pneumothorax.  Cardiomediastinal silhouette is within normal limits.  Upper thoracic levoscoliosis.  IMPRESSION: No evidence of acute cardiopulmonary disease.   Original Report Authenticated By: Charline Bills, M.D.       Medications:     Scheduled Medications:    . [COMPLETED] benzonatate  200 mg Oral Once  . [facility] HOLD] carvedilol  3.125 mg Oral BID WC  . [facility] HOLD] gabapentin  100 mg Oral TID  . [MAR HOLD] insulin aspart  0-9 Units Subcutaneous Q4H  . [COMPLETED] insulin aspart  6 Units Intravenous Once  . [facility] HOLD] levothyroxine  25 mcg Oral QAC breakfast  . [MAR HOLD] losartan  100 mg Oral Daily  . [COMPLETED] pantoprazole (PROTONIX) IV  80 mg Intravenous Once  . [facility] HOLD] simvastatin  5 mg Oral q1800  . [facility] HOLD] sodium chloride  3 mL Intravenous Q12H     Infusions:    . sodium chloride 1,000 mL (02/26/12 0600)  . pantoprozole (PROTONIX) infusion 8 mg/hr (02/26/12 0500)  . [DISCONTINUED] sodium chloride Stopped (02/25/12 1636)     PRN  Medications:  [MAR HOLD] albuterol, [EXPIRED] chlorpheniramine-HYDROcodone, [COMPLETED] chlorpheniramine-HYDROcodone, [MAR HOLD] guaiFENesin-dextromethorphan, [MAR HOLD] HYDROcodone-acetaminophen, [MAR HOLD] nitroGLYCERIN, [MAR HOLD] ondansetron (ZOFRAN) IV, [MAR HOLD] ondansetron, [MAR HOLD] polyethylene glycol, [DISCONTINUED] butamben-tetracaine-benzocaine, [DISCONTINUED] fentaNYL, [DISCONTINUED] midazolam   Assessment:   1. Syncope (? Arrythmia) 2. GI Bleed      - EGD negative.  Awaiting colonoscopy 3. CAD s/p DES to LAD in 2/13    - Brilinta/ASA on hold 4. Chronic diastolic heart failure 5. DM2  Plan/Discussion:    Syncopal episode of unknown etiology.  No arrythmias noted on telemetry.  Does not appear ischemic in nature with normal troponins and EKG.  Will continue to follow on telemetry.  May need holter at discharge.  Will continue to hold brilinta/ASA at this time.  Will await colonoscopy results for final decision on which agents to continue.     Length of Stay: 1  Robbi Garter, [facility] 02/26/2012, 9:46 AM  Patient seen and examined with Alyssa Blossom, PA-C. We discussed all aspects of the encounter. I agree with the assessment and plan as stated above.   Suspect syncope due to hypovolemia or vagal and not necessarily arrhythmogenic. Typically with DES would continue Brillinta for 1 year but with newer generation of stents (like she has) 6 months is probably sufficient  Given bleeding and fact that she is Jehovah's Witness would stop ASA and Brillinta as risk of recurrent bleeding high than risk of late stent thrombosis. Can start ASA 81mg  soon.   As a note to GI, Brilinta will take at least 5 days to wear off so would not plan to biopsy anything on colonoscopy tomorrow.   Truman Hayward 6:25 PM

## 2012-02-26 NOTE — Interval H&P Note (Signed)
History and Physical Interval Note:  02/26/2012 8:17 AM  Alyssa [name]  has presented today for surgery, with the diagnosis of GI Bleed  The various methods of treatment have been discussed with the patient and family. After consideration of risks, benefits and other options for treatment, the patient has consented to  Procedure(s) (LRB) with comments: ESOPHAGOGASTRODUODENOSCOPY (EGD) (N/A) as a surgical intervention .  The patient's history has been reviewed, patient examined, no change in status, stable for surgery.  I have reviewed the patient's chart and labs.  Questions were answered to the patient's satisfaction.     Katrine Radich C.

## 2012-02-27 ENCOUNTER — Encounter (HOSPITAL_COMMUNITY): Payer: Self-pay | Admitting: Gastroenterology

## 2012-02-27 ENCOUNTER — Encounter (HOSPITAL_COMMUNITY)
Admission: EM | Disposition: A | Discharge status: Home Health | Payer: Self-pay | Source: Home / Self Care | Attending: Internal Medicine

## 2012-02-27 HISTORY — PX: COLONOSCOPY: SHX5424

## 2012-02-27 LAB — GLUCOSE, CAPILLARY
Glucose-Capillary: 116 mg/dL — ABNORMAL HIGH (ref 70–99)
Glucose-Capillary: 94 mg/dL (ref 70–99)
Glucose-Capillary: 114 mg/dL — ABNORMAL HIGH (ref 70–99)
Glucose-Capillary: 93 mg/dL (ref 70–99)

## 2012-02-27 LAB — VITAMIN B12: Vitamin B-12: 426 pg/mL (ref 211–911)

## 2012-02-27 LAB — BASIC METABOLIC PANEL
CO2: 23 mEq/L (ref 19–32)
GFR calc non Af Amer: 83 mL/min — ABNORMAL LOW (ref >90)
Glucose, Bld: 109 mg/dL — ABNORMAL HIGH (ref 70–99)
Potassium: 3.2 mEq/L — ABNORMAL LOW (ref 3.5–5.1)
Sodium: 131 mEq/L — ABNORMAL LOW (ref 135–145)

## 2012-02-27 LAB — CBC
Hemoglobin: 9 g/dL — ABNORMAL LOW (ref 12.0–15.0)
MCHC: 32.5 g/dL (ref 30.0–36.0)
Platelets: 169 10*3/uL (ref 150–400)
RBC: 3.45 MIL/uL — ABNORMAL LOW (ref 3.87–5.11)

## 2012-02-27 SURGERY — COLONOSCOPY
Anesthesia: Moderate Sedation

## 2012-02-27 MED ORDER — FENTANYL CITRATE 0.05 MG/ML IJ SOLN
INTRAMUSCULAR | Status: DC | PRN
  Administered 2012-02-27 (×2): 25 ug via INTRAVENOUS
  Administered 2012-02-27 (×2): 10 ug via INTRAVENOUS
  Administered 2012-02-27: 25 ug via INTRAVENOUS
  Administered 2012-02-27: 5 ug via INTRAVENOUS

## 2012-02-27 MED ORDER — PANTOPRAZOLE SODIUM 40 MG PO TBEC
40.0000 mg | DELAYED_RELEASE_TABLET | Freq: Every day | ORAL | Status: DC
  Administered 2012-02-28: 40 mg via ORAL
  Filled 2012-02-27: qty 1

## 2012-02-27 MED ORDER — INSULIN ASPART 100 UNIT/ML ~~LOC~~ SOLN: 0.0000 [IU] | Freq: Three times a day (TID) | SUBCUTANEOUS | Status: DC

## 2012-02-27 MED ORDER — POTASSIUM CHLORIDE CRYS ER 20 MEQ PO TBCR
40.0000 meq | EXTENDED_RELEASE_TABLET | Freq: Two times a day (BID) | ORAL | Status: AC
  Administered 2012-02-27 – 2012-02-28 (×3): 40 meq via ORAL
  Filled 2012-02-27 (×3): qty 2

## 2012-02-27 MED ORDER — SODIUM CHLORIDE 0.9 % IV SOLN
500.0000 mg | Freq: Every day | INTRAVENOUS | Status: AC
  Administered 2012-02-27 – 2012-02-28 (×2): 500 mg via INTRAVENOUS
  Filled 2012-02-27 (×3): qty 10

## 2012-02-27 MED ORDER — SODIUM CHLORIDE 0.9 % IV SOLN
25.0000 mg | Freq: Once | INTRAVENOUS | Status: AC
  Administered 2012-02-27: 25 mg via INTRAVENOUS
  Filled 2012-02-27: qty 0.5

## 2012-02-27 MED ORDER — DARBEPOETIN ALFA-POLYSORBATE 40 MCG/0.4ML IJ SOLN
40.0000 ug | INTRAMUSCULAR | Status: DC
  Administered 2012-02-27: 40 ug via SUBCUTANEOUS
  Filled 2012-02-27: qty 0.4

## 2012-02-27 MED ORDER — SODIUM CHLORIDE 0.9 % IV SOLN
INTRAVENOUS | Status: DC
  Administered 2012-02-27: 16:00:00 via INTRAVENOUS

## 2012-02-27 MED ORDER — MIDAZOLAM HCL 5 MG/ML IJ SOLN
INTRAMUSCULAR | Status: AC
  Filled 2012-02-27: qty 2

## 2012-02-27 MED ORDER — FENTANYL CITRATE 0.05 MG/ML IJ SOLN
INTRAMUSCULAR | Status: AC
  Filled 2012-02-27: qty 2

## 2012-02-27 MED ORDER — MIDAZOLAM HCL 5 MG/5ML IJ SOLN
INTRAMUSCULAR | Status: DC | PRN
  Administered 2012-02-27 (×2): 1 mg via INTRAVENOUS
  Administered 2012-02-27: 2 mg via INTRAVENOUS

## 2012-02-27 NOTE — Op Note (Signed)
[facility] [address] [place], [address]   COLONOSCOPY PROCEDURE REPORT  PATIENT: Alyssa [name], Alyssa [name]  MR#: [id number] BIRTHDATE: [birth year] , 76  yrs. old GENDER: Female ENDOSCOPIST: Charlott Rakes, MD REFERRED [id number] team PROCEDURE DATE:  02/27/2012 PROCEDURE:   Colonoscopy with tissue ablation ASA CLASS:   Class III INDICATIONS:hematochezia and melena. MEDICATIONS: Fentanyl 100 mcg IV and Versed 4 mg IV  DESCRIPTION OF PROCEDURE:   After the risks benefits and alternatives of the procedure were thoroughly explained, informed consent was obtained.  The     endoscope was introduced through the anus and advanced to the cecum, which was identified by both the appendix and ileocecal valve , limited by No adverse events experienced.   The quality of the prep was fair. .  The instrument was then slowly withdrawn as the colon was fully examined.     FINDINGS:  Rectal exam unremarkable. On insertion there was yellowish, green stool noted in the colon. Diffuse sigmoid diverticulosis was noted.  In the proximal ascending colon was a large nonbleeding AVM noted that was beefy red looking. Pediatric colonoscope inserted into the colon and advanced to the cecum, where the appendiceal orifice and ileocecal valve were identified. The terminal ileum was intubated and was normal in appearance although looping prevented passage of the colonoscope beyond the most distal portion of the terminal ileum.  In the cecum an 8 mm semi-sessile polyp was seen that was biopsied for histology. Argon plasma coagulation at 1 L/min was used to fulgurate the AVM and during fulguration a small amount of bleeding occurred that resolved with repeated APC to the AVM. On careful withdrawal of the colonoscope no other mucosal abnormalities were noted. Retroflexion revealed small internal hemorrhoids.  COMPLICATIONS: None  IMPRESSION:     1. Large proximal ascending AVM - s/p  APC for fulguration; suspect this AVM was source of bleeding 2. Cecal polyp - s/p biopsy 3. Sigmoid diverticulosis 4. Small internal hemorrhoids  RECOMMENDATIONS: F/U on path; Supportive care; Clear liquid diet; Hold anticoagulation for 2 weeks if possible    ______________________________ eSignedCharlott Rakes, MD 02/27/2012 10:11 AM   CC:  PATIENT NAME:  Alyssa [name], Alyssa [name] MR#: [id number]

## 2012-02-27 NOTE — Progress Notes (Signed)
Nutrition Brief Note  Patient identified on the Malnutrition Screening Tool (MST) Report  Body mass index is 31.20 kg/(m^2). Pt meets criteria for Obesity Class I based on current BMI.  Wt Readings from Last 10 Encounters:  02/27/12 165 lb 2 oz (74.9 kg)  02/27/12 165 lb 2 oz (74.9 kg)  02/27/12 165 lb 2 oz (74.9 kg)  11/10/11 155 lb 12.8 oz (70.67 kg)  10/23/11 157 lb (71.215 kg)  10/14/11 159 lb (72.122 kg)  07/03/11 161 lb (73.029 kg)  06/12/11 162 lb 7.7 oz (73.7 kg)  06/12/11 163 lb (73.936 kg)  06/12/11 162 lb 7.7 oz (73.7 kg)    Weight range from 155-165 lb. Per pt recent weight gain was due to being taken off of her thyroid medication which she then resumed.   Current diet order is Clear Liquids, which she seems to tolerate well. MD to monitor and advance diet as appropriate. Labs and medications reviewed.   No nutrition interventions warranted at this time. If nutrition issues arise, please consult RD.   Kendell Bane RD, LDN, CNSC [phone] Pager [phone] After Hours Pager

## 2012-02-27 NOTE — Interval H&P Note (Signed)
History and Physical Interval Note:  02/27/2012 9:07 AM  Alyssa [name]  has presented today for surgery, with the diagnosis of anemia  The various methods of treatment have been discussed with the patient and family. After consideration of risks, benefits and other options for treatment, the patient has consented to  Procedure(s) (LRB) with comments: COLONOSCOPY (N/A) as a surgical intervention .  The patient's history has been reviewed, patient examined, no change in status, stable for surgery.  I have reviewed the patient's chart and labs.  Questions were answered to the patient's satisfaction.     Ancelmo Hunt C.

## 2012-02-27 NOTE — Evaluation (Signed)
Physical Therapy Evaluation Patient Details Name: Alyssa [name] MRN: [id number] DOB: [birth year] Today's Date: 02/27/2012 Time: 7829-5621 PT Time Calculation (min): 25 min  PT Assessment / Plan / Recommendation Clinical Impression  Pt is 76 y/o female admitted for synpoce episode and bloody stool.  Pt found to have AVM.  Pt with gait instability and will benefit from acute PT services to improve overall mobility to prepare for safe d/c home.  Pt will need to perform stair negotiation and need further assess if DME needed.     PT Assessment  Patient needs continued PT services    Follow Up Recommendations  Home health PT;Supervision/Assistance - 24 hour    Does the patient have the potential to tolerate intense rehabilitation      Barriers to Discharge None      Equipment Recommendations   (need to determine if DME device needed.)    Recommendations for Other Services     Frequency Min 3X/week    Precautions / Restrictions Precautions Precautions: Fall   Pertinent Vitals/Pain No c/o pain and vital stable throughout mobility      Mobility  Bed Mobility Bed Mobility: Supine to Sit Supine to Sit: 5: Supervision;HOB flat Details for Bed Mobility Assistance: Supervision for safety Transfers Transfers: Sit to Stand;Stand to Sit Sit to Stand: 5: Supervision;From bed Stand to Sit: 4: Min guard;To chair/3-in-1 Details for Transfer Assistance: Minguard for safety with cues for hand placement Ambulation/Gait Ambulation/Gait Assistance: 4: Min assist Ambulation Distance (Feet): 300 Feet Assistive device: None Ambulation/Gait Assistance Details: Pt with LOB x 2 and needed min (A) to correct and maintain uprigh posture.  Pt's right LE continues to staggered and cross like scissor gait intermittenly throughout gait sequence.  Pt reported this going on for ~4 months and physician aware and change medications. Gait Pattern: Step-through pattern;Decreased step length -  right;Shuffle;Narrow base of support;Scissoring Stairs: No    Shoulder Instructions     Exercises     PT Diagnosis: Difficulty walking;Abnormality of gait;Generalized weakness  PT Problem List: Decreased activity tolerance;Decreased balance;Decreased mobility;Decreased knowledge of use of DME PT Treatment Interventions: DME instruction;Gait training;Stair training;Functional mobility training;Therapeutic activities;Therapeutic exercise;Balance training;Neuromuscular re-education;Patient/family education   PT Goals Acute Rehab PT Goals PT Goal Formulation: With patient Time For Goal Achievement: 03/05/12 Potential to Achieve Goals: Good Pt will go Supine/Side to Sit: with modified independence PT Goal: Supine/Side to Sit - Progress: Goal set today Pt will go Sit to Supine/Side: with modified independence PT Goal: Sit to Supine/Side - Progress: Goal set today Pt will go Sit to Stand: with modified independence PT Goal: Sit to Stand - Progress: Goal set today Pt will go Stand to Sit: with modified independence PT Goal: Stand to Sit - Progress: Goal set today Pt will Ambulate: >150 feet;with modified independence;with least restrictive assistive device PT Goal: Ambulate - Progress: Goal set today Pt will Go Up / Down Stairs: 3-5 stairs;with modified independence;with least restrictive assistive device PT Goal: Up/Down Stairs - Progress: Goal set today  Visit Information  Last PT Received On: 02/27/12 Assistance Needed: +1    Subjective Data  Subjective: "I've never had anything like this happen to me. Patient Stated Goal: To go home to my puppy   Prior Functioning  Home Living Lives With: Spouse Available Help at Discharge: Family Type of Home: House Home Access: Stairs to enter Secretary/administrator of Steps: 3 Entrance Stairs-Rails: Right Home Layout: Two level (with basement) Alternate Level Stairs-Number of Steps: 14 Alternate Level Stairs-Rails: Right  Bathroom  Shower/Tub: IT trainer: No Home Adaptive Equipment: None Prior Function Level of Independence: Independent Driving: Yes Communication Communication: No difficulties    Cognition  Overall Cognitive Status: Appears within functional limits for tasks assessed/performed    Extremity/Trunk Assessment Right Lower Extremity Assessment RLE ROM/Strength/Tone: Within functional levels Left Lower Extremity Assessment LLE ROM/Strength/Tone: Within functional levels   Balance    End of Session PT - End of Session Equipment Utilized During Treatment: Gait belt Activity Tolerance: Patient tolerated treatment well Patient left: in chair;with call bell/phone within reach;with family/visitor present Nurse Communication: Mobility status  GP     Sian Joles 02/27/2012, 3:51 PM  Jake Shark, PT DPT [phone]

## 2012-02-27 NOTE — Progress Notes (Signed)
[facility] [place] TEAM 1 - Stepdown/ICU TEAM  Alyssa [name] ZOX:[id number] DOB: August 08, 1935 DOA: 02/25/2012 PCP: [name], MD  Brief narrative: 76 y.o. Female Jehovah Witness with history of CAD status post drug-eluting stent by Dr. Excell Seltzer in late February 2013, chronic diastolic dysfunction with EF around 60%, DM type II, who had a dry cough for the last few days for which he was taking over-the-counter medications, she was also taking some extra aspirin from time to time for headaches , this morning patient sat in the toilet for a bowel movement, she said she had large amount of blood in the toilet that made her dizzy and passed out for a few moments, she then presented to the ER where she was Hemoccult positive with dark stool, patient does have mild nausea but her BUN was normal, her head CT and EKG along with chest x-ray were stable, initial vital signs are stable house call to admit the patient for syncope, bright red blood along with melena later.  Assessment/Plan:  Melena Resolved for now - EGD revealed only mild gastritis - colo confirms AVMs - advancing diet per GI  Anemia of acute blood loss Hbg was 12-13 prior to bleed and now 9-10 - due to being a Jehovah's Witness, pt declines blood - dose w/ IV Fe and epo  DM type 2 (diabetes mellitus, type 2) Cont sliding scale for now - CBG currently well controlled  CHF Diastolic - grade 1, Ef 60% in 8119 Care as per HF Team  Coronary atherosclerosis of native coronary artery s/p DES Feb 2013 HF Team is suggesting we "stop Brillinta and just resume ASA 81 on discharge" - GI would like to keep pt off anticoag for 2weeks - will do so, and after 2 weeks resume only ASA 81mg QD  GERD (gastroesophageal reflux disease) Protonix - change to po  Syncope Due to acute blood loss - ambulate  Hyponatremia Likely due to GI prep - replace and f/u in AM  Hypokalemia Likely due to GI prep - replace and f/u in AM  Code Status:  full code Disposition Plan: follow in SDU additional 24hrs DVT prophylaxis: SCDs  Consultants:  GI   Heart Failure Team  Procedures: 11/14 - EGD - Minimal antral gastritis otherwise normal EGD; No bleeding seen 11/15 - Colo - Large proximal ascending AVM s/p APC for fulguration; suspect this AVM was source of bleeding - Cecal polyp s/p biopsy - Sigmoid diverticulosis - Small internal hemorrhoids  Antibiotics:  none  HPI/Subjective: The pt is seen post endo.  She denies cp, sob, f/c, n/v, or abdom pain.    Objective: Filed Vitals:   02/27/12 0950 02/27/12 1002 02/27/12 1123 02/27/12 1210  BP: 119/60 127/65 101/37 101/37  Pulse:   76 77  Temp:    98.1 F (36.7 C)  TempSrc:    Oral  Resp: 20 17 19 19   Height:      Weight:      SpO2: 97%  91% 96%    Intake/Output Summary (Last 24 hours) at 02/27/12 1446 Last data filed at 02/27/12 1308  Gross per 24 hour  Intake 1059.16 ml  Output    700 ml  Net 359.16 ml    Exam:  General: No acute respiratory distress Lungs: Clear to auscultation bilaterally without wheezes or crackles Cardiovascular: Regular rate and rhythm without murmur gallop or rub normal S1 and S2 Abdomen: Nontender, nondistended, soft, bowel sounds positive, no rebound, no ascites, no appreciable mass Extremities:  No significant cyanosis, clubbing, or edema bilateral lower extremities  Data Reviewed: Basic Metabolic Panel:  Lab 02/27/12 8413 02/26/12 0454 02/25/12 1237  NA 131* 134* 133*  K 3.2* 3.6 4.1  CL 101 102 95*  CO2 23 23 24   GLUCOSE 109* 158* 436*  BUN 6 9 17   CREATININE 0.71 0.73 0.78  CALCIUM 8.3* 8.4 9.6  MG -- -- --  PHOS -- -- --   CBC:  Lab 02/27/12 0530 02/26/12 0454 02/25/12 2325 02/25/12 2010 02/25/12 1548 02/25/12 1237  WBC 6.2 8.0 -- -- -- 11.0*  NEUTROABS -- -- -- -- -- 9.4*  HGB 9.0* 9.6* 9.8* 10.3* 10.9* --  HCT 27.7* 29.8* 29.6* 31.4* 33.5* --  MCV 80.3 80.5 -- -- -- 81.6  PLT 169 197 -- -- -- 247   CBG:  Lab  02/27/12 0850 02/27/12 0328 02/26/12 2345 02/26/12 2009 02/26/12 1714  GLUCAP 94 125* 116* 128* 137*    Recent Results (from the past 240 hour(s))  MRSA PCR SCREENING     Status: Normal   Collection Time   02/25/12  5:44 PM      Component Value Range Status Comment   MRSA by PCR NEGATIVE  NEGATIVE Final      Studies: Ct Head Wo Contrast  02/25/2012  *RADIOLOGY REPORT*  Clinical Data: Loss of consciousness.  The patient had a syncopal episode while on the toilet.  She struck her head.  CT HEAD WITHOUT CONTRAST  Technique:  Contiguous axial images were obtained from the base of the skull through the vertex without contrast.  Comparison: None.   IMPRESSION:  1.  Bilateral lacunar infarcts of the basal ganglia appear remote. 2.  Mild atrophy and white matter disease likely reflects the sequelae of chronic microvascular ischemia. 3.  No acute intracranial abnormality. 4.  Chronic opacification of the right maxillary sinus.   Original Report Authenticated By: Marin Roberts, M.D.    Dg Chest Port 1 View  02/25/2012  *RADIOLOGY REPORT*  Clinical Data: Shortness of breath, loss of consciousness  PORTABLE CHEST - 1 VIEW  Comparison: 05/26/2011  IMPRESSION: No evidence of acute cardiopulmonary disease.   Original Report Authenticated By: Charline Bills, M.D.     Scheduled Meds:    . carvedilol  3.125 mg Oral BID WC  . dextromethorphan  30 mg Oral BID  . gabapentin  100 mg Oral TID  . guaiFENesin  600 mg Oral BID  . insulin aspart  0-9 Units Subcutaneous Q4H  . levothyroxine  25 mcg Oral QAC breakfast  . losartan  100 mg Oral Daily  . [COMPLETED] polyethylene glycol-electrolytes  4,000 mL Oral Once  . simvastatin  5 mg Oral q1800  . sodium chloride  3 mL Intravenous Q12H  . [COMPLETED] white petrolatum      . [DISCONTINUED] dextromethorphan  30 mg Oral BID   Continuous Infusions:    . [EXPIRED] sodium chloride 1,000 mL (02/26/12 1500)  . sodium chloride    . pantoprozole  (PROTONIX) infusion 8 mg/hr (02/27/12 1300)   _______________________________________________________________________  Lonia Blood, MD [facility] Office  [phone] Pager [phone]  On-Call/Text Page:      [email or web address]      password [facility]  02/27/2012, 2:46 PM  LOS: 2 days

## 2012-02-27 NOTE — H&P (View-Only) (Signed)
[facility] PROGRESS NOTE  Alyssa [name] ZOX:[id number] DOB: 1935-06-26 DOA: 02/25/2012 PCP: [name], MD  Assessment/Plan: Principal Problem:  *Melena Resolved for now- EGD reveals gastritis- cont protonix infusion per GI- cont clears- for colonoscopy in AM.   Active Problems:  Anemia of acute blood loss Hbg was 12/13 prior to bleed and now 9/10.  Due to being a Jehovah's Witness, pt declines blood, obtain anemia panel. If retic count low, will need to order aranesp.    DM type 2 (diabetes mellitus, type 2) Cont sliding scale for now   CHF Diastolic - grade 1, Ef 60% in 8119 Slow IVF   Coronary atherosclerosis of native coronary artery  Hyperlipemia   GERD (gastroesophageal reflux disease) As above- cont Protonix   Syncope Due to acute blood loss   Code Status: full code Family Communication: with husband Disposition Plan: follow in SDU DVT prophylaxis: SCDs   Brief narrative: Alyssa [name] is a 76 y.o. female, with history of CAD status post drug-eluting stent by Dr. Excell Seltzer in late February of this year, chronic diastolic dysfunction with EF around 60%, DM type II, and he and who had a dry cough for the last few days for which he was taking over-the-counter min medications, she was also taking some extra aspirin from time to time for headaches , this morning patient sat in the toilet for a bowel movement, she said she had large amount of blood in the toilet that made her dizzy and passed out for a few moments, she then presented to the ER where she was Hemoccult positive with dark stool, patient does have mild nausea but her BUN was normal, her head CT and EKG along with chest x-ray were stable, initial vital signs are stable house call to admit the patient for syncope, bright red blood along with melena later.   Consultants:  GI   Procedures:  EGD today  Antibiotics:  none  HPI/Subjective: No specific complaints- explained to her the finding of  gastritis on EGD and the need to avoid NSAIDS.   Objective: Filed Vitals:   02/26/12 0900 02/26/12 0910 02/26/12 0930 02/26/12 1156  BP: 110/42 110/45  114/47  Pulse:      Temp:   99.4 F (37.4 C) 99.4 F (37.4 C)  TempSrc:   Oral Oral  Resp: 22 25    Height:      Weight:      SpO2: 95% 95%      Intake/Output Summary (Last 24 hours) at 02/26/12 1657 Last data filed at 02/26/12 0808  Gross per 24 hour  Intake 173.75 ml  Output   1850 ml  Net -1676.25 ml    Exam:   General:  Alert, no acute distress, oriented x 3  Cardiovascular: RRR, no murmur  Respiratory: CTA b/l  Abdomen: soft, NT, ND, BS+  Ext:no c/c/e  Data Reviewed: Basic Metabolic Panel:  Lab 02/26/12 1478 02/25/12 1237  NA 134* 133*  K 3.6 4.1  CL 102 95*  CO2 23 24  GLUCOSE 158* 436*  BUN 9 17  CREATININE 0.73 0.78  CALCIUM 8.4 9.6  MG -- --  PHOS -- --   Liver Function Tests: No results found for this basename: AST:5,ALT:5,ALKPHOS:5,BILITOT:5,PROT:5,ALBUMIN:5 in the last 168 hours No results found for this basename: LIPASE:5,AMYLASE:5 in the last 168 hours No results found for this basename: AMMONIA:5 in the last 168 hours CBC:  Lab 02/26/12 0454 02/25/12 2325 02/25/12 2010 02/25/12 1548 02/25/12 1237  WBC 8.0 -- -- --  11.0*  NEUTROABS -- -- -- -- 9.4*  HGB 9.6* 9.8* 10.3* 10.9* 10.7*  HCT 29.8* 29.6* 31.4* 33.5* 33.2*  MCV 80.5 -- -- -- 81.6  PLT 197 -- -- -- 247   Cardiac Enzymes: No results found for this basename: CKTOTAL:5,CKMB:5,CKMBINDEX:5,TROPONINI:5 in the last 168 hours BNP (last 3 results)  Basename 05/26/11 2115  PROBNP 611.5*   CBG:  Lab 02/26/12 1136 02/26/12 0931 02/26/12 0325 02/25/12 2332 02/25/12 2010  GLUCAP 149* 158* 147* 127* 196*    Recent Results (from the past 240 hour(s))  MRSA PCR SCREENING     Status: Normal   Collection Time   02/25/12  5:44 PM      Component Value Range Status Comment   MRSA by PCR NEGATIVE  NEGATIVE Final      Studies: Ct  Head Wo Contrast  02/25/2012  *RADIOLOGY REPORT*  Clinical Data: Loss of consciousness.  The patient had a syncopal episode while on the toilet.  She struck her head.  CT HEAD WITHOUT CONTRAST  Technique:  Contiguous axial images were obtained from the base of the skull through the vertex without contrast.  Comparison: None.  Findings: Lacunar infarcts of the basal ganglia appear remote. Mild periventricular white matter hypoattenuation is present bilaterally.  Mild generalized atrophy is present. No acute cortical infarct, hemorrhage, mass lesion is present.  The ventricles are proportionate to the degree of atrophy.  No significant extra-axial fluid collection is present.  Chronic opacification of the right maxillary sinus is noted.  The paranasal sinuses are otherwise clear.  Mastoid air cells are clear.  Soft tissue swelling is present in the left paramidline posterior parietal scalp without an underlying fracture.  No significant intracranial hemorrhage is associated.  IMPRESSION:  1.  Bilateral lacunar infarcts of the basal ganglia appear remote. 2.  Mild atrophy and white matter disease likely reflects the sequelae of chronic microvascular ischemia. 3.  No acute intracranial abnormality. 4.  Chronic opacification of the right maxillary sinus.   Original Report Authenticated By: Marin Roberts, M.D.    Dg Chest Port 1 View  02/25/2012  *RADIOLOGY REPORT*  Clinical Data: Shortness of breath, loss of consciousness  PORTABLE CHEST - 1 VIEW  Comparison: 05/26/2011  Findings: Chronic interstitial markings/hyperinflation.  No focal consolidation.  No pleural effusion or pneumothorax.  Cardiomediastinal silhouette is within normal limits.  Upper thoracic levoscoliosis.  IMPRESSION: No evidence of acute cardiopulmonary disease.   Original Report Authenticated By: Charline Bills, M.D.     Scheduled Meds:   . [COMPLETED] benzonatate  200 mg Oral Once  . carvedilol  3.125 mg Oral BID WC  .  dextromethorphan  30 mg Oral BID  . gabapentin  100 mg Oral TID  . guaiFENesin  600 mg Oral BID  . insulin aspart  0-9 Units Subcutaneous Q4H  . levothyroxine  25 mcg Oral QAC breakfast  . losartan  100 mg Oral Daily  . polyethylene glycol-electrolytes  4,000 mL Oral Once  . simvastatin  5 mg Oral q1800  . sodium chloride  3 mL Intravenous Q12H  . white petrolatum       Continuous Infusions:   . [EXPIRED] sodium chloride 1,000 mL (02/26/12 1500)  . sodium chloride    . pantoprozole (PROTONIX) infusion 8 mg/hr (02/26/12 0500)    ________________________________________________________________________  Time spent: 35 min    [facility]  [facility] Pager [phone] If 8PM-8AM, please contact night-coverage at [email or web address], password [facility] 02/26/2012, 4:57 PM  LOS: 1 day

## 2012-02-27 NOTE — Progress Notes (Signed)
Advanced Heart Failure Rounding Note   Subjective:    Alyssa [name] is a 76 y.o. Jehovah Witness with past medical history pertinent for DM2, hypothyroidism and CAD s/p Promus DES to LAD by Dr. Excell Seltzer on 06/12/11. Myoview 10/23/11: Overall low risk scan. LV Ejection Fraction: 74%. LV Wall Motion: NL LV Function; NL Wall Motion  Admitted 11/13 with syncopal episode that was precipitated by diaphoresis but no chest pain as well as 2 days of cold like symptoms.  She has taken 2 ASA 325 mg on two separate occasions this week as well as cough medicine OTC.  She then went and had a BM with +bright red blood.  In ER heme+stool and melena.  Hgb 10.6.  Currently Brilinta and ASA on hold.  Troponin negative.  EGD 11/14 by Dr. Bosie Clos: minimal gastritis but no source of bleeding. Colonoscopy 11/15 showed large nonbleeding AVM.    Currently no complaints of chest pain, orthopnea, PND.  No further syncope or BRBPR.   Objective:   Weight Range:  Vital Signs:   Temp:  [97.5 F (36.4 C)-99.4 F (37.4 C)] 98.7 F (37.1 C) (11/15 0837) Pulse Rate:  [67-98] 82  (11/15 0837) Resp:  [16-23] 17  (11/15 1002) BP: (109-167)/(38-75) 127/65 mmHg (11/15 1002) SpO2:  [93 %-99 %] 97 % (11/15 0950) Weight:  [74.9 kg (165 lb 2 oz)] 74.9 kg (165 lb 2 oz) (11/15 0439) Last BM Date: 02/27/12  Weight change: Filed Weights   02/25/12 1730 02/26/12 0414 02/27/12 0439  Weight: 72 kg (158 lb 11.7 oz) 73.3 kg (161 lb 9.6 oz) 74.9 kg (165 lb 2 oz)    Intake/Output:   Intake/Output Summary (Last 24 hours) at 02/27/12 1117 Last data filed at 02/27/12 0800  Gross per 24 hour  Intake 924.16 ml  Output    350 ml  Net 574.16 ml     Physical Exam: General: Well appearing. No respiratory difficulty  HEENT: normal  Neck: supple. no JVD. Carotids 2+ bilat; no bruits. No lymphadenopathy or thryomegaly appreciated.  Cor: PMI nondisplaced. Regular rate & rhythm. No rubs, gallops or murmurs.  Lungs: clear  Abdomen: obese  soft, nontender, nondistended. No hepatosplenomegaly. No bruits or masses. Good bowel sounds.  Extremities: no cyanosis, clubbing, rash, edema  Neuro: alert & oriented x 3, cranial nerves grossly intact. moves all 4 extremities w/o difficulty. Affect pleasant.   Telemetry: NSR 70-90s  Labs: Basic Metabolic Panel:  Lab 02/27/12 1610 02/26/12 0454 02/25/12 1237  NA 131* 134* 133*  K 3.2* 3.6 4.1  CL 101 102 95*  CO2 23 23 24   GLUCOSE 109* 158* 436*  BUN 6 9 17   CREATININE 0.71 0.73 0.78  CALCIUM 8.3* 8.4 9.6  MG -- -- --  PHOS -- -- --    Liver Function Tests: No results found for this basename: AST:5,ALT:5,ALKPHOS:5,BILITOT:5,PROT:5,ALBUMIN:5 in the last 168 hours No results found for this basename: LIPASE:5,AMYLASE:5 in the last 168 hours No results found for this basename: AMMONIA:3 in the last 168 hours  CBC:  Lab 02/27/12 0530 02/26/12 0454 02/25/12 2325 02/25/12 2010 02/25/12 1548 02/25/12 1237  WBC 6.2 8.0 -- -- -- 11.0*  NEUTROABS -- -- -- -- -- 9.4*  HGB 9.0* 9.6* 9.8* 10.3* 10.9* --  HCT 27.7* 29.8* 29.6* 31.4* 33.5* --  MCV 80.3 80.5 -- -- -- 81.6  PLT 169 197 -- -- -- 247    Cardiac Enzymes: No results found for this basename: CKTOTAL:5,CKMB:5,CKMBINDEX:5,TROPONINI:5 in the last 168 hours  BNP:  BNP (last 3 results)  Basename 05/26/11 2115  PROBNP 611.5*     Other results:  EKG: NSR 86, low voltage   Imaging: Ct Head Wo Contrast  02/25/2012  *RADIOLOGY REPORT*  Clinical Data: Loss of consciousness.  The patient had a syncopal episode while on the toilet.  She struck her head.  CT HEAD WITHOUT CONTRAST  Technique:  Contiguous axial images were obtained from the base of the skull through the vertex without contrast.  Comparison: None.  Findings: Lacunar infarcts of the basal ganglia appear remote. Mild periventricular white matter hypoattenuation is present bilaterally.  Mild generalized atrophy is present. No acute cortical infarct, hemorrhage, mass  lesion is present.  The ventricles are proportionate to the degree of atrophy.  No significant extra-axial fluid collection is present.  Chronic opacification of the right maxillary sinus is noted.  The paranasal sinuses are otherwise clear.  Mastoid air cells are clear.  Soft tissue swelling is present in the left paramidline posterior parietal scalp without an underlying fracture.  No significant intracranial hemorrhage is associated.  IMPRESSION:  1.  Bilateral lacunar infarcts of the basal ganglia appear remote. 2.  Mild atrophy and white matter disease likely reflects the sequelae of chronic microvascular ischemia. 3.  No acute intracranial abnormality. 4.  Chronic opacification of the right maxillary sinus.   Original Report Authenticated By: Marin Roberts, M.D.    Dg Chest Port 1 View  02/25/2012  *RADIOLOGY REPORT*  Clinical Data: Shortness of breath, loss of consciousness  PORTABLE CHEST - 1 VIEW  Comparison: 05/26/2011  Findings: Chronic interstitial markings/hyperinflation.  No focal consolidation.  No pleural effusion or pneumothorax.  Cardiomediastinal silhouette is within normal limits.  Upper thoracic levoscoliosis.  IMPRESSION: No evidence of acute cardiopulmonary disease.   Original Report Authenticated By: Charline Bills, M.D.      Medications:     Scheduled Medications:    . carvedilol  3.125 mg Oral BID WC  . dextromethorphan  30 mg Oral BID  . gabapentin  100 mg Oral TID  . guaiFENesin  600 mg Oral BID  . insulin aspart  0-9 Units Subcutaneous Q4H  . levothyroxine  25 mcg Oral QAC breakfast  . losartan  100 mg Oral Daily  . [COMPLETED] polyethylene glycol-electrolytes  4,000 mL Oral Once  . simvastatin  5 mg Oral q1800  . sodium chloride  3 mL Intravenous Q12H  . [COMPLETED] white petrolatum      . [DISCONTINUED] dextromethorphan  30 mg Oral BID    Infusions:    . [EXPIRED] sodium chloride 1,000 mL (02/26/12 1500)  . sodium chloride    . pantoprozole  (PROTONIX) infusion 8 mg/hr (02/27/12 0800)    PRN Medications: albuterol, HYDROcodone-acetaminophen, nitroGLYCERIN, ondansetron (ZOFRAN) IV, ondansetron, polyethylene glycol, [DISCONTINUED] fentaNYL, [DISCONTINUED] guaiFENesin-dextromethorphan, [DISCONTINUED] midazolam   Assessment:   1. Syncope (? Arrythmia) 2. AVM by colonoscopy  3. CAD s/p DES to LAD in 2/13    - Brilinta/ASA on hold 4. Chronic diastolic heart failure 5. DM2  Plan/Discussion:    Would recommend discontinuation of Brillinta in setting of large AVM and Jehovah's Witness with risk of recurrent bleeding.  Can restart ASA 81 mg daily when ok by GI.     Length of Stay: 2  Robbi Garter, [facility] 02/27/2012, 11:17 AM  Patient seen and examined with Ulyess Blossom, PA-C. We discussed all aspects of the encounter. I agree with the assessment and plan as stated above.   Results of colonscopy reviewed. Given presence of  colonic AVM likely has other AVMs in remainder of bowel. Would stop Brillinta and just resume ASA 81 on discharge.  Will sign off. Please call with questions.  Reuel Boom Bensimhon,MD 12:48 PM

## 2012-02-27 NOTE — Brief Op Note (Signed)
Large nonbleeding AVM that I think was the source of the bleeding. Eradicated with argon plasma coagulation. See endopro for details and recs. Clear liquids. Dr. Ewing Schlein to see tomorrow.

## 2012-02-27 NOTE — Progress Notes (Signed)
Patient returned to room from endo. Vss.

## 2012-02-27 NOTE — Progress Notes (Signed)
Hung test dose of iron. Patient had no complications during infusion.

## 2012-02-28 ENCOUNTER — Inpatient Hospital Stay (HOSPITAL_COMMUNITY): Payer: Medicare Other

## 2012-02-28 LAB — BASIC METABOLIC PANEL
CO2: 20 mEq/L (ref 19–32)
Calcium: 8.9 mg/dL (ref 8.4–10.5)
Chloride: 107 mEq/L (ref 96–112)
Potassium: 4.1 mEq/L (ref 3.5–5.1)
Sodium: 137 mEq/L (ref 135–145)

## 2012-02-28 LAB — GLUCOSE, CAPILLARY: Glucose-Capillary: 142 mg/dL — ABNORMAL HIGH (ref 70–99)

## 2012-02-28 LAB — CBC
MCV: 81.1 fL (ref 78.0–100.0)
Platelets: 195 10*3/uL (ref 150–400)
RBC: 3.66 MIL/uL — ABNORMAL LOW (ref 3.87–5.11)
WBC: 5.2 10*3/uL (ref 4.0–10.5)

## 2012-02-28 MED ORDER — GUAIFENESIN ER 600 MG PO TB12: 600.0000 mg | ORAL_TABLET | Freq: Two times a day (BID) | ORAL | Status: DC

## 2012-02-28 MED ORDER — DEXTROMETHORPHAN POLISTIREX 30 MG/5ML PO LQCR: 30.0000 mg | Freq: Two times a day (BID) | ORAL | Status: DC

## 2012-02-28 NOTE — Discharge Summary (Signed)
Physician Discharge Summary  Alyssa [name] MVH:[id number] DOB: 1935-06-14 DOA: 02/25/2012  PCP: Alyssa Hamper, MD  Admit date: 02/25/2012 Discharge date: 02/28/2012  Recommendations for Outpatient Follow-up:  1. Pt will need to follow up with PCP in 2-3 weeks post discharge 2. Please obtain BMP to evaluate electrolytes and kidney function 3. Please also check CBC to evaluate Hg and Hct levels 4. Pt had colonoscopy done and will have to have biopsy results followed up on next appointment with GI, this was communicated to the patient   Discharge Diagnoses: Acute blood loss anemia secondary to AVM Principal Problem:  *Melena Active Problems:  DM type 2 (diabetes mellitus, type 2)  Chr Diastolic, Ef 60% in 2013  Coronary atherosclerosis of native coronary artery  Hyperlipemia  GERD (gastroesophageal reflux disease)  Syncope  Blood loss anemia    Discharge Condition: Stable  Diet recommendation: Heart healthy diet discussed in details   History of present illness:  76 y.o. Female Jehovah Witness with history of CAD status post drug-eluting stent by Dr. Excell Seltzer in late February 2013, chronic diastolic dysfunction with EF around 60%, DM type II, who had a dry cough for the last few days for which he was taking over-the-counter medications, she was also taking some extra aspirin from time to time for headaches , this morning patient sat in the toilet for a bowel movement, she said she had large amount of blood in the toilet that made her dizzy and passed out for a few moments, she then presented to the ER where she was Hemoccult positive with dark stool, patient does have mild nausea but her BUN was normal, her head CT and EKG along with chest x-ray were stable, initial vital signs are stable house call to admit the patient for syncope, bright red blood along with melena later.   Assessment/Plan:  Melena  Resolved for now - EGD revealed only mild gastritis - colo confirms AVMs -  advancing diet per GI and pt tolerating well, wants to go home Anemia of acute blood loss  Hbg was 12-13 prior to bleed and now 9-10 - due to being a Jehovah's Witness, pt declines blood - dose w/ IV Fe and epo  DM type 2 (diabetes mellitus, type 2)  Cont sliding scale for now - CBG currently well controlled  CHF Diastolic - grade 1, Ef 60% in 8413  Care as per HF Team  Coronary atherosclerosis of native coronary artery s/p DES Feb 2013  HF Team is suggesting we "stop Brillinta and just resume ASA 81 on discharge" - GI would like to keep pt off anticoag for 2weeks - will do so, and after 2 weeks resume only ASA 81mg QD  GERD (gastroesophageal reflux disease)  Protonix - change to po  Syncope  Due to acute blood loss - ambulate  Hyponatremia  Likely due to GI prep - replace and f/u in AM  Hypokalemia  Likely due to GI prep - replace and f/u in AM   Consultants:  GI  Heart Failure Team  Procedures:  11/14 - EGD - Minimal antral gastritis otherwise normal EGD; No bleeding seen  11/15 - Colo - Large proximal ascending AVM s/p APC for fulguration; suspect this AVM was source of bleeding - Cecal polyp s/p biopsy - Sigmoid diverticulosis - Small internal hemorrhoids   Antibiotics:  none  Discharge Exam: Filed Vitals:   02/28/12 1125  BP:   Pulse:   Temp: 98.3 F (36.8 C)  Resp:  Filed Vitals:   02/28/12 0300 02/28/12 0747 02/28/12 0800 02/28/12 1125  BP: 124/38  131/37   Pulse: 76 78 72   Temp: 98.2 F (36.8 C)  97.9 F (36.6 C) 98.3 F (36.8 C)  TempSrc: Tympanic  Oral Oral  Resp: 21  20   Height:      Weight: 73.755 kg (162 lb 9.6 oz)     SpO2: 95%  92%     General: Pt is alert, follows commands appropriately, not in acute distress Cardiovascular: Regular rate and rhythm, S1/S2 +, no murmurs, no rubs, no gallops Respiratory: Clear to auscultation bilaterally, no wheezing, no crackles, no rhonchi Abdominal: Soft, non tender, non distended, bowel sounds +, no  guarding Extremities: no edema, no cyanosis, pulses palpable bilaterally DP and PT Neuro: Grossly nonfocal  Discharge Instructions  Discharge Orders    Future Orders Please Complete By Expires   Diet - low sodium heart healthy      Increase activity slowly          Medication List     As of 02/28/2012 12:15 PM    TAKE these medications         aspirin 81 MG chewable tablet   Chew 81 mg by mouth daily. For pressure or chest pain      carvedilol 3.125 MG tablet   Commonly known as: COREG   Take 1 tablet (3.125 mg total) by mouth 2 (two) times daily with a meal.      dextromethorphan 30 MG/5ML liquid   Commonly known as: DELSYM   Take 5 mLs (30 mg total) by mouth 2 (two) times daily.      gabapentin 100 MG capsule   Commonly known as: NEURONTIN   Take 100 mg by mouth 3 (three) times daily.      guaiFENesin 600 MG 12 hr tablet   Commonly known as: MUCINEX   Take 1 tablet (600 mg total) by mouth 2 (two) times daily.      levothyroxine 25 MCG tablet   Commonly known as: SYNTHROID, LEVOTHROID   Take 25 mcg by mouth daily.      losartan 100 MG tablet   Commonly known as: COZAAR   Take 1 tablet (100 mg total) by mouth daily.      metFORMIN 500 MG 24 hr tablet   Commonly known as: GLUCOPHAGE-XR   Take 500 mg by mouth 2 (two) times daily.      nitroGLYCERIN 0.4 MG SL tablet   Commonly known as: NITROSTAT   Place 1 tablet (0.4 mg total) under the tongue every 5 (five) minutes as needed for chest pain.      pantoprazole 40 MG tablet   Commonly known as: PROTONIX   Take 1 tablet (40 mg total) by mouth daily.      polyethylene glycol packet   Commonly known as: MIRALAX / GLYCOLAX   Take 17 g by mouth daily as needed. For constipation      pravastatin 40 MG tablet   Commonly known as: PRAVACHOL   Take 40 mg by mouth at bedtime.      spironolactone 50 MG tablet   Commonly known as: ALDACTONE   Take 25 mg by mouth daily.      Ticagrelor 90 MG Tabs tablet    Commonly known as: BRILINTA   Take 1 tablet (90 mg total) by mouth 2 (two) times daily.           Follow-up Information  Follow up with Alyssa Hamper, MD. In 2 weeks.   Contact information:   [address] [place] [address] [phone]           The results of significant diagnostics from this hospitalization (including imaging, microbiology, ancillary and laboratory) are listed below for reference.     Microbiology: Recent Results (from the past 240 hour(s))  MRSA PCR SCREENING     Status: Normal   Collection Time   02/25/12  5:44 PM      Component Value Range Status Comment   MRSA by PCR NEGATIVE  NEGATIVE Final      Labs: Basic Metabolic Panel:  Lab 02/28/12 9147 02/27/12 0530 02/26/12 0454 02/25/12 1237  NA 137 131* 134* 133*  K 4.1 3.2* 3.6 4.1  CL 107 101 102 95*  CO2 20 23 23 24   GLUCOSE 109* 109* 158* 436*  BUN 6 6 9 17   CREATININE 0.66 0.71 0.73 0.78  CALCIUM 8.9 8.3* 8.4 9.6  MG -- -- -- --  PHOS -- -- -- --   Liver Function Tests: No results found for this basename: AST:5,ALT:5,ALKPHOS:5,BILITOT:5,PROT:5,ALBUMIN:5 in the last 168 hours No results found for this basename: LIPASE:5,AMYLASE:5 in the last 168 hours No results found for this basename: AMMONIA:5 in the last 168 hours CBC:  Lab 02/28/12 0510 02/27/12 0530 02/26/12 0454 02/25/12 2325 02/25/12 2010 02/25/12 1237  WBC 5.2 6.2 8.0 -- -- 11.0*  NEUTROABS -- -- -- -- -- 9.4*  HGB 9.8* 9.0* 9.6* 9.8* 10.3* --  HCT 29.7* 27.7* 29.8* 29.6* 31.4* --  MCV 81.1 80.3 80.5 -- -- 81.6  PLT 195 169 197 -- -- 247   Cardiac Enzymes: No results found for this basename: CKTOTAL:5,CKMB:5,CKMBINDEX:5,TROPONINI:5 in the last 168 hours BNP: BNP (last 3 results)  Basename 05/26/11 2115  PROBNP 611.5*   CBG:  Lab 02/28/12 1139 02/28/12 0740 02/27/12 2156 02/27/12 1956 02/27/12 1728  GLUCAP 142* 104* 93 114* 104*     SIGNED: Time coordinating discharge: Over 30  minutes  Debbora Presto, MD  [facility] 02/28/2012, 12:15 PM Pager [phone]  If 7PM-7AM, please contact night-coverage [email or web address] Password [email or web address]

## 2012-02-28 NOTE — Progress Notes (Signed)
NCM spoke to pt and offered choice for [facility]. Pt has [employer] and gave her choice to agencies that accept [facility]. Pt did not have choice for HH PT. Contacted [facility] for [facility] PT for scheduled d/c today. Verified address on facesheet is correct. Notified [facility] of pt's scheduled d/c home today. NCM notified pt of HH agency. Provided contact number for [facility]. Isidoro Donning RN CCM Case Mgmt phone [phone]

## 2012-02-28 NOTE — Progress Notes (Signed)
Alyssa [name] 11:03 AM  Subjective: The patient is doing well without any problems from her colonoscopy and she wants to eat and has no signs of bleeding  Objective: Vital signs stable afebrile no acute distress abdomen is soft nontender hemoglobin stable  Assessment: Results GI bleeding  Plan: Okay to advance diet and home soon and will call for biopsy results in one week and followup with her primary gastroenterologist Dr. Marjorie Smolder in a few weeks. And call us sooner if you have any further questions or problems Dmari Schubring E

## 2012-02-28 NOTE — Progress Notes (Signed)
Physical Therapy Treatment Patient Details Name: Alyssa [name] MRN: [id number] DOB: [birth year] Today's Date: 02/28/2012 Time: 8295-6213 PT Time Calculation (min): 17 min  PT Assessment / Plan / Recommendation Comments on Treatment Session  Pt progressing well, was able to perform stairs today with supervision.  Do not recommend any equipment but recommend HHPT to help pt return to prior level of strength. PT will continue to follow.    Follow Up Recommendations  Home health PT;Supervision/Assistance - 24 hour     Does the patient have the potential to tolerate intense rehabilitation     Barriers to Discharge        Equipment Recommendations  None recommended by PT    Recommendations for Other Services    Frequency Min 3X/week   Plan Discharge plan remains appropriate;Frequency remains appropriate    Precautions / Restrictions Precautions Precautions: Fall Restrictions Weight Bearing Restrictions: No   Pertinent Vitals/Pain No c/o pain, VSS    Mobility  Bed Mobility Bed Mobility: Not assessed (pt up in chair) Transfers Transfers: Sit to Stand;Stand to Sit Sit to Stand: 6: Modified independent (Device/Increase time);From chair/3-in-1 Stand to Sit: 6: Modified independent (Device/Increase time);To chair/3-in-1 Details for Transfer Assistance: Pt transferring safely without assist Ambulation/Gait Ambulation/Gait Assistance: 5: Supervision Ambulation Distance (Feet): 200 Feet Assistive device: None Ambulation/Gait Assistance Details: Pt's gait has improved today, no LOB, no scissoring. Gait speed is decreased and pt reports fatigue after stairs but do not feel she needs an AD at this time.  Recommend that pt increase ambulation for remaining hospital stay, 5x/ day around unit. Gait Pattern: Decreased stride length;Step-through pattern Gait velocity: decreased Stairs: Yes Stairs Assistance: 5: Supervision Stairs Assistance Details (indicate cue type and reason): pt began  to descend stairs with alternating pattern  but felt soreness in her left leg and began step-to pattern, no problems with ascension Stair Management Technique: One rail Right;Alternating pattern;Step to pattern;Forwards Number of Stairs: 10  Wheelchair Mobility Wheelchair Mobility: No    Exercises     PT Diagnosis:    PT Problem List:   PT Treatment Interventions:     PT Goals Acute Rehab PT Goals PT Goal Formulation: With patient Time For Goal Achievement: 03/05/12 Potential to Achieve Goals: Good Pt will go Supine/Side to Sit: with modified independence Pt will go Sit to Supine/Side: with modified independence Pt will go Sit to Stand: with modified independence PT Goal: Sit to Stand - Progress: Met Pt will go Stand to Sit: with modified independence PT Goal: Stand to Sit - Progress: Met Pt will Ambulate: >150 feet;with modified independence;with least restrictive assistive device PT Goal: Ambulate - Progress: Progressing toward goal Pt will Go Up / Down Stairs: 3-5 stairs;with modified independence;with least restrictive assistive device PT Goal: Up/Down Stairs - Progress: Progressing toward goal  Visit Information  Last PT Received On: 02/28/12 Assistance Needed: +1    Subjective Data  Subjective: I wish I could wash my hair Patient Stated Goal: return home ASAP   Cognition  Overall Cognitive Status: Appears within functional limits for tasks assessed/performed Arousal/Alertness: Awake/alert Orientation Level: Oriented X4 / Intact Behavior During Session: Operating Room Services for tasks performed    Balance  Balance Balance Assessed: Yes Dynamic Standing Balance Dynamic Standing - Balance Support: No upper extremity supported;During functional activity Dynamic Standing - Level of Assistance: 5: Stand by assistance  End of Session PT - End of Session Equipment Utilized During Treatment: Gait belt Activity Tolerance: Patient tolerated treatment well Patient left: in chair;with call  bell/phone within reach Nurse Communication: Mobility status   GP   Lyanne Co, PT  Acute Rehab Services  [phone]   Lyanne Co 02/28/2012, 9:21 AM

## 2012-02-28 NOTE — Progress Notes (Signed)
Patient d/c'd home per MD order.  VVS and assessment WNL. Patient and family given d/c instructions/prescriptions called to pharmacy and all questions answered. Patient d/c'd via wheelchair with NT and family.

## 2012-03-01 ENCOUNTER — Encounter (HOSPITAL_COMMUNITY): Payer: Self-pay

## 2012-03-01 ENCOUNTER — Encounter (HOSPITAL_COMMUNITY): Payer: Self-pay | Admitting: Gastroenterology

## 2012-03-01 LAB — FOLATE: Folate: 14.1 ng/mL (ref >5.4)

## 2012-06-12 ENCOUNTER — Other Ambulatory Visit (HOSPITAL_COMMUNITY): Payer: Self-pay | Admitting: Physician Assistant

## 2012-07-14 ENCOUNTER — Other Ambulatory Visit (HOSPITAL_COMMUNITY): Payer: Self-pay | Admitting: Internal Medicine

## 2012-09-23 ENCOUNTER — Encounter (HOSPITAL_COMMUNITY): Payer: Self-pay | Admitting: Pharmacy Technician

## 2012-09-29 ENCOUNTER — Encounter (HOSPITAL_COMMUNITY): Payer: Self-pay | Admitting: *Deleted

## 2012-09-29 DIAGNOSIS — Z531 Procedure and treatment not carried out because of patient's decision for reasons of belief and group pressure: Secondary | ICD-10-CM

## 2012-09-29 DIAGNOSIS — IMO0001 Reserved for inherently not codable concepts without codable children: Secondary | ICD-10-CM

## 2012-09-29 HISTORY — DX: Procedure and treatment not carried out because of patient's decision for reasons of belief and group pressure: Z53.1

## 2012-09-29 HISTORY — DX: Reserved for inherently not codable concepts without codable children: IMO0001

## 2012-10-12 ENCOUNTER — Ambulatory Visit (HOSPITAL_COMMUNITY): Admit: 2012-10-12 | Payer: Medicare Other | Admitting: Gastroenterology

## 2012-10-12 HISTORY — DX: Peripheral vascular disease, unspecified: I73.9

## 2012-10-12 HISTORY — DX: Gastro-esophageal reflux disease without esophagitis: K21.9

## 2012-10-12 HISTORY — DX: Procedure and treatment not carried out because of patient's decision for reasons of belief and group pressure: Z53.1

## 2012-10-12 SURGERY — COLONOSCOPY
Anesthesia: Monitor Anesthesia Care

## 2012-11-30 ENCOUNTER — Emergency Department (HOSPITAL_COMMUNITY)
Admission: EM | Admit: 2012-11-30 | Discharge: 2012-11-30 | Disposition: A | Discharge status: Home / Self Care | Payer: Medicare Other | Attending: Emergency Medicine | Admitting: Emergency Medicine

## 2012-11-30 ENCOUNTER — Emergency Department (HOSPITAL_COMMUNITY): Payer: Medicare Other

## 2012-11-30 ENCOUNTER — Encounter (HOSPITAL_COMMUNITY): Payer: Self-pay | Admitting: *Deleted

## 2012-11-30 DIAGNOSIS — K219 Gastro-esophageal reflux disease without esophagitis: Secondary | ICD-10-CM | POA: Insufficient documentation

## 2012-11-30 DIAGNOSIS — Z9861 Coronary angioplasty status: Secondary | ICD-10-CM | POA: Insufficient documentation

## 2012-11-30 DIAGNOSIS — I2510 Atherosclerotic heart disease of native coronary artery without angina pectoris: Secondary | ICD-10-CM | POA: Insufficient documentation

## 2012-11-30 DIAGNOSIS — E039 Hypothyroidism, unspecified: Secondary | ICD-10-CM | POA: Insufficient documentation

## 2012-11-30 DIAGNOSIS — I509 Heart failure, unspecified: Secondary | ICD-10-CM | POA: Insufficient documentation

## 2012-11-30 DIAGNOSIS — R0789 Other chest pain: Secondary | ICD-10-CM | POA: Insufficient documentation

## 2012-11-30 DIAGNOSIS — Z87891 Personal history of nicotine dependence: Secondary | ICD-10-CM | POA: Insufficient documentation

## 2012-11-30 DIAGNOSIS — E119 Type 2 diabetes mellitus without complications: Secondary | ICD-10-CM | POA: Insufficient documentation

## 2012-11-30 DIAGNOSIS — Z79899 Other long term (current) drug therapy: Secondary | ICD-10-CM | POA: Insufficient documentation

## 2012-11-30 DIAGNOSIS — R05 Cough: Secondary | ICD-10-CM | POA: Insufficient documentation

## 2012-11-30 DIAGNOSIS — Z8679 Personal history of other diseases of the circulatory system: Secondary | ICD-10-CM | POA: Insufficient documentation

## 2012-11-30 DIAGNOSIS — R059 Cough, unspecified: Secondary | ICD-10-CM | POA: Insufficient documentation

## 2012-11-30 DIAGNOSIS — I10 Essential (primary) hypertension: Secondary | ICD-10-CM | POA: Insufficient documentation

## 2012-11-30 DIAGNOSIS — M259 Joint disorder, unspecified: Secondary | ICD-10-CM | POA: Insufficient documentation

## 2012-11-30 DIAGNOSIS — E785 Hyperlipidemia, unspecified: Secondary | ICD-10-CM | POA: Insufficient documentation

## 2012-11-30 DIAGNOSIS — Z7982 Long term (current) use of aspirin: Secondary | ICD-10-CM | POA: Insufficient documentation

## 2012-11-30 DIAGNOSIS — M129 Arthropathy, unspecified: Secondary | ICD-10-CM | POA: Insufficient documentation

## 2012-11-30 DIAGNOSIS — I252 Old myocardial infarction: Secondary | ICD-10-CM | POA: Insufficient documentation

## 2012-11-30 LAB — COMPREHENSIVE METABOLIC PANEL
AST: 20 U/L (ref 0–37)
Albumin: 3.9 g/dL (ref 3.5–5.2)
Alkaline Phosphatase: 55 U/L (ref 39–117)
Chloride: 101 mEq/L (ref 96–112)
Potassium: 4 mEq/L (ref 3.5–5.1)
Sodium: 136 mEq/L (ref 135–145)
Total Bilirubin: 0.2 mg/dL — ABNORMAL LOW (ref 0.3–1.2)
Total Protein: 6.9 g/dL (ref 6.0–8.3)

## 2012-11-30 LAB — CBC WITH DIFFERENTIAL/PLATELET
Basophils Absolute: 0 10*3/uL (ref 0.0–0.1)
Basophils Relative: 1 % (ref 0–1)
Eosinophils Absolute: 0.2 10*3/uL (ref 0.0–0.7)
Hemoglobin: 12.3 g/dL (ref 12.0–15.0)
MCHC: 34 g/dL (ref 30.0–36.0)
Neutro Abs: 5 10*3/uL (ref 1.7–7.7)
Neutrophils Relative %: 63 % (ref 43–77)
Platelets: 239 10*3/uL (ref 150–400)
RDW: 15 % (ref 11.5–15.5)

## 2012-11-30 LAB — POCT I-STAT TROPONIN I

## 2012-11-30 MED ORDER — ASPIRIN 81 MG PO CHEW
324.0000 mg | CHEWABLE_TABLET | Freq: Once | ORAL | Status: AC
  Administered 2012-11-30: 324 mg via ORAL
  Filled 2012-11-30: qty 4

## 2012-11-30 NOTE — ED Provider Notes (Signed)
CSN: [id number]     Arrival date & time 11/30/12  1814 History     First MD Initiated Contact with Patient 11/30/12 1925     Chief Complaint  Patient presents with  . Chest Pain   (Consider location/radiation/quality/duration/timing/severity/associated sxs/prior Treatment) HPI Comments: 77 yo female with CHF, DM, MI, CAD, melena presents after three episodes of chest tightness earlier today, last one ended around 330 pm, lasting minutes, sharp, anterior.  Pt has had similar episodes but this is more frequent.  Unsure cardiologist, has seen Dr Excell Seltzer.  No sob or recent surgery/ leg pain/ swelling or travel.  Pt has no sxs in ED.  No recent exertional or diaphoresis sxs.  Improved with time. Non radiating.   Patient is a 77 y.o. female presenting with chest pain. The history is provided by the patient.  Chest Pain Associated symptoms: cough   Associated symptoms: no abdominal pain, no back pain, no fever, no headache, no shortness of breath and not vomiting     Past Medical History  Diagnosis Date  . Diabetes mellitus   . Hypothyroid   . HLD (hyperlipidemia)   . CAD (coronary artery disease)     NSTEMI 05/2011 - LHC 06/04/11: LAD 99%, oD1 70% at LAD lesion, prox and mid CFX 30%, mOM1 70-80%, pRCA 40%, distal 40% with some collaterals to the LAD, EF 45-50% with apical dyskinesis and no thrombus.  PCI: Promus DES to the proximal LAD.;  Echo 05/27/11: LVH, EF 55-60%, grade 1 diastolic dysfunction.  [name] HTN (hypertension)   . Myocardial infarction   . CHF (congestive heart failure)   . GERD (gastroesophageal reflux disease)   . Arthritis     arthritis hands, knees  . Refusal of blood transfusions as patient is Jehovah's Witness 09-29-12  . Peripheral vascular disease 09-29-12    "was told has a right leg blockage" was given 08-15-12 RX  for Cilostazol,"not taking yet"   Past Surgical History  Procedure Laterality Date  . Abdominal hysterectomy    . Tonsillectomy    .  Esophagogastroduodenoscopy  02/26/2012    Procedure: ESOPHAGOGASTRODUODENOSCOPY (EGD);  Surgeon: Shirley Friar, MD;  Location: [facility] ENDOSCOPY;  Service: Endoscopy;  Laterality: N/A;  . Colonoscopy  02/27/2012    Procedure: COLONOSCOPY;  Surgeon: Shirley Friar, MD;  Location: [facility] ENDOSCOPY;  Service: Endoscopy;  Laterality: N/A;  . Cesarean section      x2, x3 NVD  . Cardiac catheterization      coronary stent   No family history on file. History  Substance Use Topics  . Smoking status: Former Smoker    Quit date: 04/14/1968  . Smokeless tobacco: Never Used  . Alcohol Use: 1.2 oz/week    2 Glasses of wine per week     Comment: occ.    OB History   Grav Para Term Preterm Abortions TAB SAB Ect Mult Living                 Review of Systems  Constitutional: Negative for fever and chills.  HENT: Negative for neck pain and neck stiffness.   Eyes: Negative for visual disturbance.  Respiratory: Positive for cough and chest tightness. Negative for shortness of breath.   Cardiovascular: Positive for chest pain.  Gastrointestinal: Negative for vomiting and abdominal pain.  Genitourinary: Negative for dysuria and flank pain.  Musculoskeletal: Negative for back pain.  Skin: Negative for rash.  Neurological: Negative for light-headedness and headaches.    Allergies  Tetanus toxoids  Home Medications   Current Outpatient Rx  Name  Route  Sig  Dispense  Refill  . aspirin 81 MG chewable tablet   Oral   Chew 81 mg by mouth daily. For pressure or chest pain         . Coenzyme Q10 (CO Q 10) 100 MG CAPS   Oral   Take 1 capsule by mouth 2 (two) times daily.         [name] levothyroxine (SYNTHROID, LEVOTHROID) 25 MCG tablet   Oral   Take 25 mcg by mouth daily before breakfast.          . losartan (COZAAR) 100 MG tablet   Oral   Take 100 mg by mouth every morning.         . metFORMIN (GLUCOPHAGE) 500 MG tablet   Oral   Take 500 mg by mouth 2 (two) times daily with a  meal.         . nitroGLYCERIN (NITROSTAT) 0.4 MG SL tablet   Sublingual   Place 0.4 mg under the tongue every 5 (five) minutes as needed for chest pain.         . pantoprazole (PROTONIX) 40 MG tablet   Oral   Take 40 mg by mouth as needed (acid reflux).          . polyethylene glycol (MIRALAX / GLYCOLAX) packet   Oral   Take 17 g by mouth daily as needed. For constipation         . pravastatin (PRAVACHOL) 40 MG tablet   Oral   Take 40 mg by mouth at bedtime.         [name] spironolactone (ALDACTONE) 25 MG tablet   Oral   Take 25 mg by mouth every morning.          BP 134/57  Pulse 68  Temp(Src) 97.7 F (36.5 C) (Oral)  Resp 15  SpO2 97% Physical Exam  Nursing note and vitals reviewed. Constitutional: She is oriented to person, place, and time. She appears well-developed and well-nourished.  HENT:  Head: Normocephalic and atraumatic.  Eyes: Conjunctivae are normal. Right eye exhibits no discharge. Left eye exhibits no discharge.  Neck: Normal range of motion. Neck supple. No tracheal deviation present.  Cardiovascular: Normal rate, regular rhythm and intact distal pulses.   Pulmonary/Chest: Effort normal and breath sounds normal.  Abdominal: Soft. She exhibits no distension. There is no tenderness. There is no guarding.  Musculoskeletal: She exhibits no edema and no tenderness.  Neurological: She is alert and oriented to person, place, and time.  Skin: Skin is warm. No rash noted.  Psychiatric: She has a normal mood and affect.    ED Course   Procedures (including critical care time)  Labs Reviewed  COMPREHENSIVE METABOLIC PANEL - Abnormal; Notable for the following:    Glucose, Bld 150 (*)    Total Bilirubin 0.2 (*)    GFR calc non Af Amer 53 (*)    GFR calc Af Amer 62 (*)    All other components within normal limits  CBC WITH DIFFERENTIAL  TROPONIN I  POCT I-STAT TROPONIN I   Dg Chest 2 View  11/30/2012   *RADIOLOGY REPORT*  Clinical Data: Chest  pain.  CHEST - 2 VIEW  Comparison: 02/28/2012  Findings: Heart size and pulmonary vascularity are normal.  The lungs are clear.  There is chronic accentuation of the thoracic kyphosis as well as an upper thoracic scoliosis, unchanged. Coronary artery stent is noted.  IMPRESSION: No acute abnormality.   Original Report Authenticated By: Francene Boyers, M.D.   1. Chest tightness     MDM  Well appearing, no sxs in ED.  EKG no acute findings, old septal infarct, reviewed olds and current and discussed with cardiology Recommended observation.  Cardiology paged, repaged, discussed and no acute intervention at this time, pt may follow up outpt or obs with CIEs.   Pt wishes to fup outpt, Patient has capacity to make decisions, understands benefits of hospitalization and risks of going home may result in MI.  Patient refuses hospital placement. Patient understands they may return at any time.   No cp in ed, no CP since 330 pm, troponins neg, asa given Outpt fup discussed.   Date: 11/30/2012  Rate: 71  Rhythm: normal sinus rhythm  QRS Axis: normal  Intervals: normal  ST/T Wave abnormalities: nonspecific ST changes  Conduction Disutrbances:none  Narrative Interpretation:   Old EKG Reviewed: similar, septal old infarction  DC   Enid Skeens, MD 11/30/12 2235

## 2012-11-30 NOTE — ED Notes (Signed)
Pt is here with mid chest pain that started a couple of hours ago, no sob.

## 2013-01-19 ENCOUNTER — Encounter (HOSPITAL_COMMUNITY): Payer: Self-pay | Admitting: Pharmacy Technician

## 2013-01-24 ENCOUNTER — Encounter (HOSPITAL_COMMUNITY): Payer: Self-pay | Admitting: *Deleted

## 2013-02-08 ENCOUNTER — Ambulatory Visit (HOSPITAL_COMMUNITY): Admission: RE | Admit: 2013-02-08 | Payer: Medicare Other | Source: Ambulatory Visit | Admitting: Gastroenterology

## 2013-02-08 SURGERY — COLONOSCOPY WITH PROPOFOL
Anesthesia: Monitor Anesthesia Care

## 2014-03-23 ENCOUNTER — Encounter (HOSPITAL_COMMUNITY): Payer: Self-pay | Admitting: Cardiovascular Disease

## 2014-04-01 ENCOUNTER — Encounter (HOSPITAL_COMMUNITY): Payer: Self-pay | Admitting: Adult Health

## 2014-04-01 ENCOUNTER — Emergency Department (HOSPITAL_COMMUNITY): Payer: Medicare HMO

## 2014-04-01 ENCOUNTER — Inpatient Hospital Stay (HOSPITAL_COMMUNITY)
Admission: EM | Admit: 2014-04-01 | Discharge: 2014-04-04 | DRG: 247 | Disposition: A | Discharge status: Home / Self Care | Payer: Medicare HMO | Attending: Internal Medicine | Admitting: Internal Medicine

## 2014-04-01 DIAGNOSIS — I10 Essential (primary) hypertension: Secondary | ICD-10-CM | POA: Diagnosis present

## 2014-04-01 DIAGNOSIS — E039 Hypothyroidism, unspecified: Secondary | ICD-10-CM | POA: Diagnosis present

## 2014-04-01 DIAGNOSIS — I214 Non-ST elevation (NSTEMI) myocardial infarction: Secondary | ICD-10-CM | POA: Diagnosis present

## 2014-04-01 DIAGNOSIS — E119 Type 2 diabetes mellitus without complications: Secondary | ICD-10-CM | POA: Diagnosis present

## 2014-04-01 DIAGNOSIS — Z955 Presence of coronary angioplasty implant and graft: Secondary | ICD-10-CM

## 2014-04-01 DIAGNOSIS — I252 Old myocardial infarction: Secondary | ICD-10-CM | POA: Diagnosis not present

## 2014-04-01 DIAGNOSIS — Z79899 Other long term (current) drug therapy: Secondary | ICD-10-CM

## 2014-04-01 DIAGNOSIS — I2510 Atherosclerotic heart disease of native coronary artery without angina pectoris: Secondary | ICD-10-CM | POA: Diagnosis present

## 2014-04-01 DIAGNOSIS — Z87891 Personal history of nicotine dependence: Secondary | ICD-10-CM

## 2014-04-01 DIAGNOSIS — Z7982 Long term (current) use of aspirin: Secondary | ICD-10-CM

## 2014-04-01 DIAGNOSIS — Z7902 Long term (current) use of antithrombotics/antiplatelets: Secondary | ICD-10-CM | POA: Diagnosis not present

## 2014-04-01 DIAGNOSIS — I739 Peripheral vascular disease, unspecified: Secondary | ICD-10-CM | POA: Diagnosis present

## 2014-04-01 DIAGNOSIS — I5032 Chronic diastolic (congestive) heart failure: Secondary | ICD-10-CM | POA: Diagnosis present

## 2014-04-01 DIAGNOSIS — Z531 Procedure and treatment not carried out because of patient's decision for reasons of belief and group pressure: Secondary | ICD-10-CM | POA: Diagnosis present

## 2014-04-01 DIAGNOSIS — E785 Hyperlipidemia, unspecified: Secondary | ICD-10-CM | POA: Diagnosis present

## 2014-04-01 DIAGNOSIS — K219 Gastro-esophageal reflux disease without esophagitis: Secondary | ICD-10-CM | POA: Diagnosis present

## 2014-04-01 DIAGNOSIS — L03116 Cellulitis of left lower limb: Secondary | ICD-10-CM | POA: Diagnosis not present

## 2014-04-01 DIAGNOSIS — M79602 Pain in left arm: Secondary | ICD-10-CM

## 2014-04-01 DIAGNOSIS — R079 Chest pain, unspecified: Secondary | ICD-10-CM | POA: Diagnosis present

## 2014-04-01 HISTORY — DX: Chronic diastolic (congestive) heart failure: I50.32

## 2014-04-01 HISTORY — DX: Type 2 diabetes mellitus without complications: E11.9

## 2014-04-01 LAB — D-DIMER, QUANTITATIVE (NOT AT ARMC): D DIMER QUANT: 0.85 ug{FEU}/mL — AB (ref 0.00–0.48)

## 2014-04-01 LAB — CBC
HEMATOCRIT: 38.2 % (ref 36.0–46.0)
HEMOGLOBIN: 12.2 g/dL (ref 12.0–15.0)
MCH: 26.5 pg (ref 26.0–34.0)
MCHC: 31.9 g/dL (ref 30.0–36.0)
MCV: 83 fL (ref 78.0–100.0)
Platelets: 252 10*3/uL (ref 150–400)
RBC: 4.6 MIL/uL (ref 3.87–5.11)
RDW: 14.3 % (ref 11.5–15.5)
WBC: 6.8 10*3/uL (ref 4.0–10.5)

## 2014-04-01 LAB — I-STAT TROPONIN, ED: Troponin i, poc: 0.67 ng/mL (ref 0.00–0.08)

## 2014-04-01 LAB — BASIC METABOLIC PANEL
Anion gap: 13 (ref 5–15)
BUN: 16 mg/dL (ref 6–23)
CALCIUM: 9.4 mg/dL (ref 8.4–10.5)
CO2: 23 mEq/L (ref 19–32)
CREATININE: 0.7 mg/dL (ref 0.50–1.10)
Chloride: 97 mEq/L (ref 96–112)
GFR calc Af Amer: >90 mL/min (ref >90)
GFR, EST NON AFRICAN AMERICAN: 82 mL/min — AB (ref >90)
GLUCOSE: 446 mg/dL — AB (ref 70–99)
Potassium: 4.6 mEq/L (ref 3.7–5.3)
SODIUM: 133 meq/L — AB (ref 137–147)

## 2014-04-01 LAB — PROTIME-INR
INR: 0.92 (ref 0.00–1.49)
PROTHROMBIN TIME: 12.5 s (ref 11.6–15.2)

## 2014-04-01 LAB — TROPONIN I: Troponin I: 1.05 ng/mL (ref <0.30)

## 2014-04-01 MED ORDER — SPIRONOLACTONE 25 MG PO TABS
25.0000 mg | ORAL_TABLET | Freq: Every day | ORAL | Status: DC
  Administered 2014-04-02 – 2014-04-04 (×2): 25 mg via ORAL
  Filled 2014-04-01 (×3): qty 1

## 2014-04-01 MED ORDER — NITROGLYCERIN IN D5W 200-5 MCG/ML-% IV SOLN: 3.0000 ug/min | INTRAVENOUS | Status: DC

## 2014-04-01 MED ORDER — CARVEDILOL 3.125 MG PO TABS
3.1250 mg | ORAL_TABLET | Freq: Two times a day (BID) | ORAL | Status: DC
  Administered 2014-04-02 – 2014-04-04 (×6): 3.125 mg via ORAL
  Filled 2014-04-01 (×8): qty 1

## 2014-04-01 MED ORDER — DEXTROSE 5 % IV SOLN
1.0000 g | INTRAVENOUS | Status: DC
  Administered 2014-04-02 – 2014-04-03 (×3): 1 g via INTRAVENOUS
  Filled 2014-04-01 (×5): qty 10

## 2014-04-01 MED ORDER — FUROSEMIDE 20 MG PO TABS
20.0000 mg | ORAL_TABLET | Freq: Every day | ORAL | Status: DC
  Administered 2014-04-02: 20 mg via ORAL
  Filled 2014-04-01 (×3): qty 1

## 2014-04-01 MED ORDER — ATORVASTATIN CALCIUM 80 MG PO TABS
80.0000 mg | ORAL_TABLET | Freq: Every day | ORAL | Status: DC
  Administered 2014-04-02 – 2014-04-03 (×2): 80 mg via ORAL
  Filled 2014-04-01 (×3): qty 1

## 2014-04-01 MED ORDER — ASPIRIN 300 MG RE SUPP
300.0000 mg | RECTAL | Status: AC
  Filled 2014-04-01: qty 1

## 2014-04-01 MED ORDER — ASPIRIN 81 MG PO CHEW
324.0000 mg | CHEWABLE_TABLET | ORAL | Status: AC
  Filled 2014-04-01: qty 4

## 2014-04-01 MED ORDER — ASPIRIN EC 81 MG PO TBEC: 81.0000 mg | DELAYED_RELEASE_TABLET | Freq: Every day | ORAL | Status: DC

## 2014-04-01 MED ORDER — LEVOTHYROXINE SODIUM 112 MCG PO TABS
112.0000 ug | ORAL_TABLET | Freq: Every day | ORAL | Status: DC
  Administered 2014-04-02 – 2014-04-04 (×3): 112 ug via ORAL
  Filled 2014-04-01 (×4): qty 1

## 2014-04-01 MED ORDER — ASPIRIN EC 81 MG PO TBEC
81.0000 mg | DELAYED_RELEASE_TABLET | Freq: Every day | ORAL | Status: DC
Start: 2014-04-02 — End: 2014-04-04
  Administered 2014-04-02 – 2014-04-04 (×3): 81 mg via ORAL
  Filled 2014-04-01 (×3): qty 1

## 2014-04-01 MED ORDER — HEPARIN BOLUS VIA INFUSION
3500.0000 [IU] | Freq: Once | INTRAVENOUS | Status: AC
  Administered 2014-04-01: 3500 [IU] via INTRAVENOUS
  Filled 2014-04-01: qty 3500

## 2014-04-01 MED ORDER — LOSARTAN POTASSIUM 50 MG PO TABS
100.0000 mg | ORAL_TABLET | Freq: Every day | ORAL | Status: DC
  Administered 2014-04-02 – 2014-04-04 (×3): 100 mg via ORAL
  Filled 2014-04-01 (×3): qty 2

## 2014-04-01 MED ORDER — GLIMEPIRIDE 4 MG PO TABS
4.0000 mg | ORAL_TABLET | Freq: Every day | ORAL | Status: DC
  Administered 2014-04-02 – 2014-04-04 (×2): 4 mg via ORAL
  Filled 2014-04-01 (×4): qty 1

## 2014-04-01 MED ORDER — AMITRIPTYLINE HCL 25 MG PO TABS
25.0000 mg | ORAL_TABLET | Freq: Every day | ORAL | Status: DC
  Administered 2014-04-02 (×2): 25 mg via ORAL
  Filled 2014-04-01 (×4): qty 1

## 2014-04-01 MED ORDER — ACETAMINOPHEN 325 MG PO TABS: 650.0000 mg | ORAL_TABLET | ORAL | Status: DC | PRN

## 2014-04-01 MED ORDER — NITROGLYCERIN 0.4 MG SL SUBL: 0.4000 mg | SUBLINGUAL_TABLET | SUBLINGUAL | Status: DC | PRN

## 2014-04-01 MED ORDER — ONDANSETRON HCL 4 MG/2ML IJ SOLN: 4.0000 mg | Freq: Four times a day (QID) | INTRAMUSCULAR | Status: DC | PRN

## 2014-04-01 MED ORDER — ASPIRIN 325 MG PO TABS
325.0000 mg | ORAL_TABLET | ORAL | Status: AC
  Administered 2014-04-01: 325 mg via ORAL
  Filled 2014-04-01: qty 1

## 2014-04-01 MED ORDER — POLYETHYLENE GLYCOL 3350 17 G PO PACK
17.0000 g | PACK | Freq: Every day | ORAL | Status: DC | PRN
  Filled 2014-04-01: qty 1

## 2014-04-01 MED ORDER — HEPARIN (PORCINE) IN NACL 100-0.45 UNIT/ML-% IJ SOLN
1150.0000 [IU]/h | INTRAMUSCULAR | Status: DC
  Administered 2014-04-01: 800 [IU]/h via INTRAVENOUS
  Administered 2014-04-02: 1050 [IU]/h via INTRAVENOUS
  Filled 2014-04-01 (×3): qty 250

## 2014-04-01 MED ORDER — GABAPENTIN 600 MG PO TABS
600.0000 mg | ORAL_TABLET | Freq: Two times a day (BID) | ORAL | Status: DC
  Administered 2014-04-02 – 2014-04-04 (×6): 600 mg via ORAL
  Filled 2014-04-01 (×7): qty 1

## 2014-04-01 MED ORDER — INSULIN ASPART 100 UNIT/ML ~~LOC~~ SOLN
0.0000 [IU] | Freq: Three times a day (TID) | SUBCUTANEOUS | Status: DC
  Administered 2014-04-02: 8 [IU] via SUBCUTANEOUS
  Administered 2014-04-02: 3 [IU] via SUBCUTANEOUS
  Administered 2014-04-02: 5 [IU] via SUBCUTANEOUS
  Administered 2014-04-03: 18:00:00 8 [IU] via SUBCUTANEOUS
  Administered 2014-04-03: 3 [IU] via SUBCUTANEOUS
  Administered 2014-04-03: 5 [IU] via SUBCUTANEOUS
  Administered 2014-04-04: 08:00:00 2 [IU] via SUBCUTANEOUS

## 2014-04-01 NOTE — ED Notes (Signed)
Pt is expressing frustration with inability to eat.  Informed pt of need for cardiologist to consult at this time.  Pt confirms understanding.

## 2014-04-01 NOTE — Progress Notes (Signed)
ANTICOAGULATION CONSULT NOTE - Initial Consult  Pharmacy Consult for Heparin Indication: chest pain/ACS  Allergies  Allergen Reactions  . Tetanus Toxoids Swelling and Rash    Patient Measurements: Height: 5\' 2"  (157.5 cm) Weight: 160 lb (72.576 kg) IBW/kg (Calculated) : 50.1 Heparin Dosing Weight: 65.6kg  Vital Signs: Temp: 98 F (36.7 C) (12/19 1633) BP: 117/69 mmHg (12/19 1800) Pulse Rate: 72 (12/19 1800)  Labs:  Recent Labs  04/01/14 1711  HGB 12.2  HCT 38.2  PLT 252  CREATININE 0.70    Estimated Creatinine Clearance: 55.8 mL/min (by C-G formula based on Cr of 0.7).   Medical History: Past Medical History  Diagnosis Date  . Diabetes mellitus   . Hypothyroid   . HLD (hyperlipidemia)   . CAD (coronary artery disease)     NSTEMI 05/2011 - LHC 06/04/11: LAD 99%, oD1 70% at LAD lesion, prox and mid CFX 30%, mOM1 70-80%, pRCA 40%, distal 40% with some collaterals to the LAD, EF 45-50% with apical dyskinesis and no thrombus.  PCI: Promus DES to the proximal LAD.;  Echo 05/27/11: LVH, EF 14-48%, grade 1 diastolic dysfunction.  [name] HTN (hypertension)   . Myocardial infarction   . CHF (congestive heart failure)   . GERD (gastroesophageal reflux disease)   . Arthritis     arthritis hands, knees  . Refusal of blood transfusions as patient is Jehovah's Witness 09-29-12  . Peripheral vascular disease 09-29-12    "was told has a right leg blockage" was given 08-15-12 RX  for Cilostazol,"not taking yet"  . MI (myocardial infarction)     Medications:  See electronic med rec  Assessment: 78yof to start heparin for CP/ACS.  - H/H and Plts wnl - No significant bleeding reported - No anticoagulants reported on med rec  Goal of Therapy:  Heparin level 0.3-0.7 units/ml Monitor platelets by anticoagulation protocol: Yes   Plan:  1. Heparin IV bolus 3500 units x 1 2. Heparin drip 800 units/hr (8 ml/hr) 3. Heparin level 8 hours after initiation 4. Daily heparin level and  CBC  Earleen Newport  [phone] 04/01/2014,6:19 PM

## 2014-04-01 NOTE — H&P (Addendum)
Admit date: 04/01/2014 Referring Physician: Dr. Aline Brochure Primary Cardiologist: Dr. Haroldine Laws Chief complaint/reason for admission:chest pain  HPI: This is a [phone] WF with a history of ASCAD with NSTEMI in 2013 with 99% LAD, 70% ostial D1, 30% prox and mid CFX, 70-80% mOM1, 40% prox RCA, 40% distal RCA with right to left collaterals s/p PCI with DES to prox LAD.  EF at time of cath was 45-50% and by echo 55-60%.  She also had a history of HTN, chronic diastolic CHF, GERD and PVD who presents to the ER with complaints of CP.  She states that she started having left arm pain from her shoulder to her elbow has been hurting constant at night for the past few weeks.  She also was having jaw pain at night as well at the same time as arm pain but not during the day.  2 days ago it started hurting all day and last night she couldn't get to sleep because of pain.  She took 2 Ibuprofen and amitriptyline and it did not help.  When she got up this am she was still having arm and jaw pain and was also having chest pain.  She says that she has some chest pain last night as well.  The chest pain has been intermittent since last night.  Currently she denies any jaw, arm or chest pain.  She denies any SOB, diaphoresis or nausea with the pain.  She also states that she has had LE edema for the past few months, especially her left leg.  She has had some erythema of her left foot for several weeks and had a doppler of the LLE 2 weeks ago that reportedly was normal.  She denies any palpitations, dizziness or syncope.    PMH:    Past Medical History  Diagnosis Date  . Diabetes mellitus   . Hypothyroid   . HLD (hyperlipidemia)   . CAD (coronary artery disease)     NSTEMI 05/2011 - LHC 06/04/11: LAD 99%, oD1 70% at LAD lesion, prox and mid CFX 30%, mOM1 70-80%, pRCA 40%, distal 40% with some collaterals to the LAD, EF 45-50% with apical dyskinesis and no thrombus.  PCI: Promus DES to the proximal LAD.;  Echo 05/27/11: LVH,  EF 09-47%, grade 1 diastolic dysfunction.  [name] HTN (hypertension)   . Myocardial infarction   . CHF (congestive heart failure)   . GERD (gastroesophageal reflux disease)   . Arthritis     arthritis hands, knees  . Refusal of blood transfusions as patient is Jehovah's Witness 09-29-12  . Peripheral vascular disease 09-29-12    "was told has a right leg blockage" was given 08-15-12 RX  for Cilostazol,"not taking yet"  . MI (myocardial infarction)     PSH:    Past Surgical History  Procedure Laterality Date  . Abdominal hysterectomy    . Tonsillectomy    . Esophagogastroduodenoscopy  02/26/2012    Procedure: ESOPHAGOGASTRODUODENOSCOPY (EGD);  Surgeon: Lear Ng, MD;  Location: [facility] ENDOSCOPY;  Service: Endoscopy;  Laterality: N/A;  . Colonoscopy  02/27/2012    Procedure: COLONOSCOPY;  Surgeon: Lear Ng, MD;  Location: [facility] ENDOSCOPY;  Service: Endoscopy;  Laterality: N/A;  . Cesarean section      x2, x3 NVD  . Cardiac catheterization      coronary stent  . Left heart catheterization with coronary angiogram N/A 06/12/2011    Procedure: LEFT HEART CATHETERIZATION WITH CORONARY ANGIOGRAM;  Surgeon: Josue Hector, MD;  Location[facility]  CATH LAB;  Service: Cardiovascular;  Laterality: N/A;    ALLERGIES:   Tetanus toxoids  Prior to Admit Meds:   (Not in a hospital admission) Family HX:   History reviewed. No pertinent family history. Social HX:    History   Social History  . Marital Status: Married    Spouse Name: N/A    Number of Children: N/A  . Years of Education: N/A   Occupational History  . Not on file.   Social History Main Topics  . Smoking status: Former Smoker    Quit date: 03/15/1970  . Smokeless tobacco: Never Used  . Alcohol Use: 1.2 oz/week    2 Glasses of wine per week     Comment: occ.   . Drug Use: No  . Sexual Activity: Not Currently   Other Topics Concern  . Not on file   Social History Narrative     ROS:  All 11 ROS were addressed and are  negative except what is stated in the HPI  PHYSICAL EXAM Filed Vitals:   04/01/14 1800  BP: 117/69  Pulse: 72  Temp:   Resp: 19   General: Well developed, well nourished, in no acute distress Head: Eyes PERRLA, No xanthomas.   Normal cephalic and atramatic  Lungs:   Clear bilaterally to auscultation and percussion. Heart:   HRRR S1 S2 Pulses are 2+ & equal.            No carotid bruit. No JVD.  No abdominal bruits. No femoral bruits. Abdomen: Bowel sounds are positive, abdomen soft and non-tender without masses  Extremities:   No clubbing, cyanosis or edema.  DP +1 Neuro: Alert and oriented X 3. Psych:  Good affect, responds appropriately   Labs:   Lab Results  Component Value Date   WBC 6.8 04/01/2014   HGB 12.2 04/01/2014   HCT 38.2 04/01/2014   MCV 83.0 04/01/2014   PLT 252 04/01/2014    Recent Labs Lab 04/01/14 1711  NA 133*  K 4.6  CL 97  CO2 23  BUN 16  CREATININE 0.70  CALCIUM 9.4  GLUCOSE 446*   Lab Results  Component Value Date   CKTOTAL 51 05/27/2011   CKMB 3.0 05/27/2011   TROPONINI 1.05* 04/01/2014   No results found for: PTT Lab Results  Component Value Date   INR 0.92 04/01/2014   INR 1.05 02/25/2012   INR 0.87 06/12/2011    No results found for: CHOL No results found for: HDL No results found for: LDLCALC No results found for: TRIG No results found for: CHOLHDL No results found for: LDLDIRECT    Radiology:  No results found.  EKG:  NSR with nonspecific T wave abnormality and low voltage  ASSESSMENT:  1.  NSTEMI currently pain free.  EKG is nonischemic. Her symptoms are a little atypical in that she has had left arm and jaw achiness only at night for 2 weeks but then started with CP last night.  No associated symptoms of SOB or diaphoresis.  Also need to consider acute PE with history of LLE edema recently, although apparently she had a recent LLE venous doppler that was negative.   She has a history of PCI of LAD with residual  borderline disease in left circ/OM and RCA by cath 2013 2. ASCAD s/p remote NSTEMI 05/2011 with PCI of LAD 3.  HTN well controlled 4.  Dyslipidemia - she had been on a statin but stopped because she "read it could cause  bad things". 5.  DM on metformin and Amaryl 6.  H/O PVD 7.  Chronic diastolic CHF - appears euvolemic 8.  LLE edema for several weeks with normal venous dopplers 2 weeks ago.  LLE is mildly erythematous ? Early cellulitis.  WBC is normal.  PLAN:   1.  Admit to tele bed 2.  IV heparin gtt 3.  IV NTG gtt 4.  ASA daily 5.  Hold Metformin since she will need cath Monday 6.  SS Insulin coverage 7.  CHeck D-Dimer 8.  Cycle cardiac enzymes until they have peaked 9.  Continue BB and ARB 10.  Start Lipitor 80mg  daily 11.  Will give a dose of Rocephin 1gm IV for possible early LLE cellulitus 11.  Plan left heart cath on Monday 12.  Check 2D echo in am to assess EF and RWMA's  Sueanne Margarita, MD  04/01/2014  9:15 PM

## 2014-04-01 NOTE — ED Notes (Signed)
Presents with 2 weeks of intermittent left jaw and  Left arm pain, pain is constant but worsens at times. Nothing makes pain better, nothing makes pain worse. Pt endorses SOB and dizziness at times and left sided back pain. Pain at worse rated 10/10. Last night pain was worse it has been. HX of MI with similar pain.

## 2014-04-01 NOTE — ED Provider Notes (Signed)
CSN: [id number]     Arrival date & time 04/01/14  1628 History   First MD Initiated Contact with Patient 04/01/14 1711     Chief Complaint  Patient presents with  . Arm Pain  . Shortness of Breath     (Consider location/radiation/quality/duration/timing/severity/associated sxs/prior Treatment) Patient is a 78 y.o. female presenting with arm pain, shortness of breath, and chest pain. The history is provided by the patient.  Arm Pain Associated symptoms include chest pain. Pertinent negatives include no abdominal pain, no headaches and no shortness of breath.  Shortness of Breath Associated symptoms: chest pain   Associated symptoms: no abdominal pain, no cough, no fever, no headaches, no neck pain and no vomiting   Chest Pain Pain location:  Substernal area Pain quality: aching   Pain radiates to:  Upper back Pain radiates to the back: yes   Pain severity:  Mild Onset quality:  Gradual Duration:  1 day Timing:  Intermittent Progression:  Worsening Chronicity:  New Context: at rest   Relieved by:  Nothing Worsened by:  Nothing tried Ineffective treatments:  None tried Associated symptoms: no abdominal pain, no back pain, no cough, no dizziness, no fatigue, no fever, no headache, no nausea, no shortness of breath and not vomiting     Past Medical History  Diagnosis Date  . Diabetes mellitus   . Hypothyroid   . HLD (hyperlipidemia)   . CAD (coronary artery disease)     NSTEMI 05/2011 - LHC 06/04/11: LAD 99%, oD1 70% at LAD lesion, prox and mid CFX 30%, mOM1 70-80%, pRCA 40%, distal 40% with some collaterals to the LAD, EF 45-50% with apical dyskinesis and no thrombus.  PCI: Promus DES to the proximal LAD.;  Echo 05/27/11: LVH, EF 67-12%, grade 1 diastolic dysfunction.  [name] HTN (hypertension)   . Myocardial infarction   . CHF (congestive heart failure)   . GERD (gastroesophageal reflux disease)   . Arthritis     arthritis hands, knees  . Refusal of blood transfusions as patient  is Jehovah's Witness 09-29-12  . Peripheral vascular disease 09-29-12    "was told has a right leg blockage" was given 08-15-12 RX  for Cilostazol,"not taking yet"  . MI (myocardial infarction)    Past Surgical History  Procedure Laterality Date  . Abdominal hysterectomy    . Tonsillectomy    . Esophagogastroduodenoscopy  02/26/2012    Procedure: ESOPHAGOGASTRODUODENOSCOPY (EGD);  Surgeon: Lear Ng, MD;  Location: [facility] ENDOSCOPY;  Service: Endoscopy;  Laterality: N/A;  . Colonoscopy  02/27/2012    Procedure: COLONOSCOPY;  Surgeon: Lear Ng, MD;  Location: [facility] ENDOSCOPY;  Service: Endoscopy;  Laterality: N/A;  . Cesarean section      x2, x3 NVD  . Cardiac catheterization      coronary stent  . Left heart catheterization with coronary angiogram N/A 06/12/2011    Procedure: LEFT HEART CATHETERIZATION WITH CORONARY ANGIOGRAM;  Surgeon: Josue Hector, MD;  Location: [facility] CATH LAB;  Service: Cardiovascular;  Laterality: N/A;   History reviewed. No pertinent family history. History  Substance Use Topics  . Smoking status: Former Smoker    Quit date: 03/15/1970  . Smokeless tobacco: Never Used  . Alcohol Use: 1.2 oz/week    2 Glasses of wine per week     Comment: occ.    OB History    No data available     Review of Systems  Constitutional: Negative for fever and fatigue.  HENT: Negative for congestion and  drooling.   Eyes: Negative for pain.  Respiratory: Negative for cough and shortness of breath.   Cardiovascular: Positive for chest pain.  Gastrointestinal: Negative for nausea, vomiting, abdominal pain and diarrhea.  Genitourinary: Negative for dysuria and hematuria.  Musculoskeletal: Negative for back pain, gait problem and neck pain.  Skin: Negative for color change.  Neurological: Negative for dizziness and headaches.  Hematological: Negative for adenopathy.  Psychiatric/Behavioral: Negative for behavioral problems.  All other systems reviewed and are  negative.     Allergies  Tetanus toxoids  Home Medications   Prior to Admission medications   Medication Sig Start Date End Date Taking? Authorizing Provider  amitriptyline (ELAVIL) 25 MG tablet Take 25 mg by mouth at bedtime.    Yes Historical Provider, MD  aspirin EC 81 MG tablet Take 81 mg by mouth daily.    Yes Historical Provider, MD  carvedilol (COREG) 3.125 MG tablet Take 3.125 mg by mouth 2 (two) times daily with a meal.  07/04/12  Yes Historical Provider, MD  Coenzyme Q10 (COQ10 PO) Take 1 capsule by mouth daily.   Yes Historical Provider, MD  furosemide (LASIX) 20 MG tablet Take 20 mg by mouth daily.    Yes Historical Provider, MD  gabapentin (NEURONTIN) 600 MG tablet Take 600 mg by mouth 2 (two) times daily.  02/18/14  Yes Historical Provider, MD  glimepiride (AMARYL) 4 MG tablet Take 4 mg by mouth daily with breakfast.    Yes Historical Provider, MD  ibuprofen (ADVIL,MOTRIN) 200 MG tablet Take 200 mg by mouth 2 (two) times daily as needed (pain).    Yes Historical Provider, MD  levothyroxine (SYNTHROID, LEVOTHROID) 112 MCG tablet Take 112 mcg by mouth daily before breakfast.   Yes Historical Provider, MD  losartan (COZAAR) 100 MG tablet Take 100 mg by mouth. 07/14/12   Historical Provider, MD  metFORMIN (GLUCOPHAGE) 500 MG tablet Take 500 mg by mouth 2 (two) times daily with a meal.   Yes Historical Provider, MD  nitroGLYCERIN (NITROSTAT) 0.4 MG SL tablet Place 0.4 mg under the tongue every 5 (five) minutes as needed for chest pain.    Yes Historical Provider, MD  pantoprazole (PROTONIX) 40 MG tablet Take 40 mg by mouth daily as needed (acid reflux).    Yes Historical Provider, MD  polyethylene glycol (MIRALAX / GLYCOLAX) packet Take 17 g by mouth daily as needed (constipation).    Yes Historical Provider, MD  spironolactone (ALDACTONE) 25 MG tablet Take 25 mg by mouth daily.  05/20/12  Yes Historical Provider, MD   BP 121/60 mmHg  Pulse 78  Temp(Src) 98 F (36.7 C)  Resp 18   Ht 5\' 2"  (1.575 m)  Wt 160 lb (72.576 kg)  BMI 29.26 kg/m2  SpO2 98% Physical Exam  Constitutional: She is oriented to person, place, and time. She appears well-developed and well-nourished.  HENT:  Head: Normocephalic and atraumatic.  Mouth/Throat: Oropharynx is clear and moist. No oropharyngeal exudate.  Eyes: Conjunctivae and EOM are normal. Pupils are equal, round, and reactive to light.  Neck: Normal range of motion. Neck supple.  Cardiovascular: Normal rate, regular rhythm, normal heart sounds and intact distal pulses.  Exam reveals no gallop and no friction rub.   No murmur heard. Pulmonary/Chest: Effort normal and breath sounds normal. No respiratory distress. She has no wheezes.  Abdominal: Soft. Bowel sounds are normal. There is no tenderness. There is no rebound and no guarding.  Musculoskeletal: Normal range of motion. She exhibits edema (trace pitting  edema in distal bilateral lower extremities.). She exhibits no tenderness.  Neurological: She is alert and oriented to person, place, and time.  Skin: Skin is warm and dry.  Psychiatric: She has a normal mood and affect. Her behavior is normal.  Nursing note and vitals reviewed.   ED Course  Procedures (including critical care time) Labs Review Labs Reviewed  BASIC METABOLIC PANEL - Abnormal; Notable for the following:    Sodium 133 (*)    Glucose, Bld 446 (*)    GFR calc non Af Amer 82 (*)    All other components within normal limits  TROPONIN I - Abnormal; Notable for the following:    Troponin I 1.05 (*)    All other components within normal limits  HEPARIN LEVEL (UNFRACTIONATED) - Abnormal; Notable for the following:    Heparin Unfractionated 0.27 (*)    All other components within normal limits  CBC - Abnormal; Notable for the following:    Hemoglobin 11.5 (*)    HCT 35.3 (*)    All other components within normal limits  D-DIMER, QUANTITATIVE - Abnormal; Notable for the following:    D-Dimer, Quant 0.85 (*)     All other components within normal limits  COMPREHENSIVE METABOLIC PANEL - Abnormal; Notable for the following:    Glucose, Bld 290 (*)    Total Bilirubin 0.2 (*)    GFR calc non Af Amer 80 (*)    All other components within normal limits  TSH - Abnormal; Notable for the following:    TSH 7.250 (*)    All other components within normal limits  TROPONIN I - Abnormal; Notable for the following:    Troponin I 0.80 (*)    All other components within normal limits  TROPONIN I - Abnormal; Notable for the following:    Troponin I 0.79 (*)    All other components within normal limits  TROPONIN I - Abnormal; Notable for the following:    Troponin I 0.41 (*)    All other components within normal limits  PRO B NATRIURETIC PEPTIDE - Abnormal; Notable for the following:    Pro B Natriuretic peptide (BNP) 853.7 (*)    All other components within normal limits  CBC WITH DIFFERENTIAL - Abnormal; Notable for the following:    Hemoglobin 11.3 (*)    HCT 34.9 (*)    All other components within normal limits  APTT - Abnormal; Notable for the following:    aPTT 57 (*)    All other components within normal limits  GLUCOSE, CAPILLARY - Abnormal; Notable for the following:    Glucose-Capillary 260 (*)    All other components within normal limits  I-STAT TROPOININ, ED - Abnormal; Notable for the following:    Troponin i, poc 0.67 (*)    All other components within normal limits  CULTURE, BLOOD (ROUTINE X 2)  CULTURE, BLOOD (ROUTINE X 2)  CBC  PROTIME-INR  MAGNESIUM  PROTIME-INR  HEMOGLOBIN A1C  OCCULT BLOOD X 1 CARD TO LAB, STOOL  HEPARIN LEVEL (UNFRACTIONATED)    Imaging Review Dg Chest 2 View  04/01/2014   CLINICAL DATA:  Chest pain, left arm pain.  EXAM: CHEST  2 VIEW  COMPARISON:  Chest radiograph 11/30/2012  FINDINGS: Stable cardiac and mediastinal contours. No consolidative pulmonary opacities. No pleural effusion or pneumothorax. Mid thoracic spine degenerative change.  IMPRESSION:  No acute cardiopulmonary process.   Electronically Signed   By: Lovey Newcomer M.D.   On: 04/01/2014 19:11  EKG Interpretation   Date/Time:  Saturday April 01 2014 16:38:09 EST Ventricular Rate:  77 PR Interval:  162 QRS Duration: 84 QT Interval:  398 QTC Calculation: 450 R Axis:   71 Text Interpretation:  Normal sinus rhythm Low voltage QRS Nonspecific T  wave abnormality Abnormal ECG Confirmed by Cambrie Sonnenfeld  MD, Sie Formisano ([id number]) on  04/01/2014 5:48:40 PM     CRITICAL CARE Performed by: Pamella Pert, S Total critical care time: 30 min Critical care time was exclusive of separately billable procedures and treating other patients. Critical care was necessary to treat or prevent imminent or life-threatening deterioration. Critical care was time spent personally by me on the following activities: development of treatment plan with patient and/or surrogate as well as nursing, discussions with consultants, evaluation of patient's response to treatment, examination of patient, obtaining history from patient or surrogate, ordering and performing treatments and interventions, ordering and review of laboratory studies, ordering and review of radiographic studies, pulse oximetry and re-evaluation of patient's condition.  MDM   Final diagnoses:  NSTEMI (non-ST elevated myocardial infarction)    5:54 PM 78 y.o. female w hx of HTN, Dm, CAD s/p NSTEMI and PCI who presents with chest pain. She states that she has had intermittent left jaw and left arm aching for the last several weeks. Her symptoms seem to be most prominent at night and not related to exertion. She states that she developed some central chest aching last night which was lasting seconds at a time and then resolving. She also had some this morning and on the way to the ER. She did not take any nitroglycerin for her pain. On exam now she currently denies any pain. She is afebrile and vital signs are unremarkable here. She denies any  shortness of breath. Her i-STAT troponin was elevated. Will repeat with laboratory troponin but will go ahead and give aspirin and heparinize.   Cards consulted for nstemi. Cards to admit.     Pamella Pert, MD 04/02/14 1146

## 2014-04-01 NOTE — ED Notes (Signed)
Heparin verified before administration with Andee Poles, RN

## 2014-04-01 NOTE — ED Notes (Signed)
NOTIFIED DR. HARRISON IN PERSON FOR PATIENTS PANIC LAB RESULTS OF I-STAT TROPONIN = 0.67 ng/ml ,@17 :46 PM ,04/01/2014.

## 2014-04-01 NOTE — ED Notes (Signed)
Alyssa [name] in main lab states she will add PT/ INR to lab work drawn earlier.

## 2014-04-02 DIAGNOSIS — I10 Essential (primary) hypertension: Secondary | ICD-10-CM | POA: Insufficient documentation

## 2014-04-02 DIAGNOSIS — I369 Nonrheumatic tricuspid valve disorder, unspecified: Secondary | ICD-10-CM

## 2014-04-02 DIAGNOSIS — I25110 Atherosclerotic heart disease of native coronary artery with unstable angina pectoris: Secondary | ICD-10-CM

## 2014-04-02 DIAGNOSIS — E785 Hyperlipidemia, unspecified: Secondary | ICD-10-CM

## 2014-04-02 LAB — GLUCOSE, CAPILLARY
GLUCOSE-CAPILLARY: 260 mg/dL — AB (ref 70–99)
GLUCOSE-CAPILLARY: 268 mg/dL — AB (ref 70–99)
Glucose-Capillary: 212 mg/dL — ABNORMAL HIGH (ref 70–99)
Glucose-Capillary: 169 mg/dL — ABNORMAL HIGH (ref 70–99)

## 2014-04-02 LAB — CBC
HCT: 35.3 % — ABNORMAL LOW (ref 36.0–46.0)
Hemoglobin: 11.5 g/dL — ABNORMAL LOW (ref 12.0–15.0)
MCH: 27.6 pg (ref 26.0–34.0)
MCHC: 32.6 g/dL (ref 30.0–36.0)
MCV: 84.7 fL (ref 78.0–100.0)
Platelets: 237 10*3/uL (ref 150–400)
RBC: 4.17 MIL/uL (ref 3.87–5.11)
RDW: 14.3 % (ref 11.5–15.5)
WBC: 5.9 10*3/uL (ref 4.0–10.5)

## 2014-04-02 LAB — HEMOGLOBIN A1C
HEMOGLOBIN A1C: 9.1 % — AB (ref <5.7)
MEAN PLASMA GLUCOSE: 214 mg/dL — AB (ref <117)

## 2014-04-02 LAB — CBC WITH DIFFERENTIAL/PLATELET
BASOS PCT: 0 % (ref 0–1)
Basophils Absolute: 0 10*3/uL (ref 0.0–0.1)
EOS PCT: 2 % (ref 0–5)
Eosinophils Absolute: 0.1 10*3/uL (ref 0.0–0.7)
HCT: 34.9 % — ABNORMAL LOW (ref 36.0–46.0)
Hemoglobin: 11.3 g/dL — ABNORMAL LOW (ref 12.0–15.0)
Lymphocytes Relative: 25 % (ref 12–46)
Lymphs Abs: 1.5 10*3/uL (ref 0.7–4.0)
MCH: 26.9 pg (ref 26.0–34.0)
MCHC: 32.4 g/dL (ref 30.0–36.0)
MCV: 83.1 fL (ref 78.0–100.0)
Monocytes Absolute: 0.3 10*3/uL (ref 0.1–1.0)
Monocytes Relative: 5 % (ref 3–12)
NEUTROS PCT: 68 % (ref 43–77)
Neutro Abs: 4.1 10*3/uL (ref 1.7–7.7)
PLATELETS: 245 10*3/uL (ref 150–400)
RBC: 4.2 MIL/uL (ref 3.87–5.11)
RDW: 14.1 % (ref 11.5–15.5)
WBC: 6 10*3/uL (ref 4.0–10.5)

## 2014-04-02 LAB — HEPARIN LEVEL (UNFRACTIONATED)
HEPARIN UNFRACTIONATED: 0.24 [IU]/mL — AB (ref 0.30–0.70)
Heparin Unfractionated: 0.27 IU/mL — ABNORMAL LOW (ref 0.30–0.70)
Heparin Unfractionated: 0.3 IU/mL (ref 0.30–0.70)

## 2014-04-02 LAB — MAGNESIUM: MAGNESIUM: 2 mg/dL (ref 1.5–2.5)

## 2014-04-02 LAB — TSH: TSH: 7.25 u[IU]/mL — ABNORMAL HIGH (ref 0.350–4.500)

## 2014-04-02 LAB — APTT: aPTT: 57 seconds — ABNORMAL HIGH (ref 24–37)

## 2014-04-02 LAB — COMPREHENSIVE METABOLIC PANEL
ALBUMIN: 3.5 g/dL (ref 3.5–5.2)
ALK PHOS: 85 U/L (ref 39–117)
ALT: 15 U/L (ref 0–35)
AST: 15 U/L (ref 0–37)
Anion gap: 13 (ref 5–15)
BUN: 15 mg/dL (ref 6–23)
CO2: 24 mEq/L (ref 19–32)
Calcium: 8.8 mg/dL (ref 8.4–10.5)
Chloride: 101 mEq/L (ref 96–112)
Creatinine, Ser: 0.75 mg/dL (ref 0.50–1.10)
GFR calc Af Amer: >90 mL/min (ref >90)
GFR calc non Af Amer: 80 mL/min — ABNORMAL LOW (ref >90)
Glucose, Bld: 290 mg/dL — ABNORMAL HIGH (ref 70–99)
Potassium: 4.1 mEq/L (ref 3.7–5.3)
SODIUM: 138 meq/L (ref 137–147)
TOTAL PROTEIN: 6.3 g/dL (ref 6.0–8.3)
Total Bilirubin: 0.2 mg/dL — ABNORMAL LOW (ref 0.3–1.2)

## 2014-04-02 LAB — PROTIME-INR
INR: 1.01 (ref 0.00–1.49)
Prothrombin Time: 13.4 seconds (ref 11.6–15.2)

## 2014-04-02 LAB — TROPONIN I
TROPONIN I: 0.8 ng/mL — AB (ref <0.30)
Troponin I: 0.79 ng/mL (ref <0.30)
Troponin I: 0.41 ng/mL (ref <0.30)

## 2014-04-02 LAB — PRO B NATRIURETIC PEPTIDE: Pro B Natriuretic peptide (BNP): 853.7 pg/mL — ABNORMAL HIGH (ref 0–450)

## 2014-04-02 MED ORDER — SODIUM CHLORIDE 0.9 % IV SOLN: 250.0000 mL | INTRAVENOUS | Status: DC | PRN

## 2014-04-02 MED ORDER — SODIUM CHLORIDE 0.9 % IJ SOLN
3.0000 mL | Freq: Two times a day (BID) | INTRAMUSCULAR | Status: DC
  Administered 2014-04-02: 3 mL via INTRAVENOUS

## 2014-04-02 MED ORDER — ASPIRIN 81 MG PO CHEW
81.0000 mg | CHEWABLE_TABLET | ORAL | Status: AC
  Administered 2014-04-03: 81 mg via ORAL
  Filled 2014-04-02: qty 1

## 2014-04-02 MED ORDER — INSULIN ASPART 100 UNIT/ML ~~LOC~~ SOLN
0.0000 [IU] | Freq: Every day | SUBCUTANEOUS | Status: DC
  Administered 2014-04-02: 3 [IU] via SUBCUTANEOUS
  Administered 2014-04-03: 2 [IU] via SUBCUTANEOUS

## 2014-04-02 MED ORDER — SODIUM CHLORIDE 0.9 % IV SOLN
INTRAVENOUS | Status: DC
  Administered 2014-04-03 (×2): via INTRAVENOUS

## 2014-04-02 MED ORDER — CLOPIDOGREL BISULFATE 75 MG PO TABS
75.0000 mg | ORAL_TABLET | Freq: Every day | ORAL | Status: AC
  Administered 2014-04-03: 75 mg via ORAL
  Filled 2014-04-02: qty 1

## 2014-04-02 MED ORDER — SODIUM CHLORIDE 0.9 % IJ SOLN
3.0000 mL | INTRAMUSCULAR | Status: DC | PRN
Start: 2014-04-02 — End: 2014-04-03

## 2014-04-02 NOTE — Progress Notes (Addendum)
[place] for Heparin Indication: chest pain/ACS  Allergies  Allergen Reactions  . Tetanus Toxoids Swelling and Rash    Patient Measurements: Height: 5\' 2"  (157.5 cm) Weight: 164 lb 12.8 oz (74.753 kg) IBW/kg (Calculated) : 50.1 Heparin Dosing Weight: 65.6kg  Vital Signs: Temp: 97.9 F (36.6 C) (12/20 0544) Temp Source: Oral (12/20 0544) BP: 126/60 mmHg (12/20 0649) Pulse Rate: 75 (12/20 0649)  Labs:  Recent Labs  04/01/14 1711 04/01/14 2345 04/02/14 0300 04/02/14 0456 04/02/14 1025 04/02/14 1215  HGB 12.2 11.3* 11.5*  --   --   --   HCT 38.2 34.9* 35.3*  --   --   --   PLT 252 245 237  --   --   --   APTT  --  57*  --   --   --   --   LABPROT 12.5 13.4  --   --   --   --   INR 0.92 1.01  --   --   --   --   HEPARINUNFRC  --   --  0.27*  --   --  0.24*  CREATININE 0.70  --  0.75  --   --   --   TROPONINI 1.05* 0.80*  --  0.79* 0.41*  --     Estimated Creatinine Clearance: 56.7 mL/min (by C-G formula based on Cr of 0.75).   Medical History: Past Medical History  Diagnosis Date  . Diabetes mellitus   . Hypothyroid   . HLD (hyperlipidemia)   . CAD (coronary artery disease)     NSTEMI 05/2011 - LHC 06/04/11: LAD 99%, oD1 70% at LAD lesion, prox and mid CFX 30%, mOM1 70-80%, pRCA 40%, distal 40% with some collaterals to the LAD, EF 45-50% with apical dyskinesis and no thrombus.  PCI: Promus DES to the proximal LAD.;  Echo 05/27/11: LVH, EF 43-15%, grade 1 diastolic dysfunction.  [name] HTN (hypertension)   . Myocardial infarction   . CHF (congestive heart failure)   . GERD (gastroesophageal reflux disease)   . Arthritis     arthritis hands, knees  . Refusal of blood transfusions as patient is Jehovah's Witness 09-29-12  . Peripheral vascular disease 09-29-12    "was told has a right leg blockage" was given 08-15-12 RX  for Cilostazol,"not taking yet"  . MI (myocardial infarction)    Assessment: Alyssa [name] to on heparin for CP/ACS.  -  H/H and Plts wnl - No significant bleeding reported - follow up heparin level still low (0.24)  Goal of Therapy:  Heparin level 0.3-0.7 units/ml Monitor platelets by anticoagulation protocol: Yes   Plan:  1. Heparin drip 1050 units/hr (10.5 ml/hr) 2. Heparin level 8 hours after initiation 3. Daily heparin level and CBC  Erin Hearing PharmD., BCPS Clinical Pharmacist Pager [phone] 04/02/2014 1:23 PM  Addendum: HL now at low end of goal (0.3). No IV or bleeding issues noted.  Plan: Increase heparin gtt to 1150 units/hr Daily HL/CBC  Erin Hearing PharmD., BCPS Clinical Pharmacist Pager [phone] 04/02/2014 9:34 PM

## 2014-04-02 NOTE — Progress Notes (Signed)
Utilization Review Completed.Alyssa [name] T12/20/2015  

## 2014-04-02 NOTE — Progress Notes (Signed)
enterred in error

## 2014-04-02 NOTE — Progress Notes (Signed)
Dorothy Spark, MD Physician Incomplete Cardiology Progress Notes 04/02/2014 8:22 AM    Expand All Collapse All      Patient Name: Alyssa [name] Date of Encounter: 04/02/2014  Active Problems:  DM type 2 (diabetes mellitus, type 2)  Coronary atherosclerosis of native coronary artery  Hyperlipemia  NSTEMI (non-ST elevated myocardial infarction)   Length of Stay: 1  SUBJECTIVE  The patient is feeling ok, no more chest pain.  CURRENT MEDS . amitriptyline 25 mg Oral QHS  . aspirin 324 mg Oral NOW   Or  . aspirin 300 mg Rectal NOW  . aspirin EC 81 mg Oral Daily  . atorvastatin 80 mg Oral q1800  . carvedilol 3.125 mg Oral BID WC  . cefTRIAXone (ROCEPHIN) IV 1 g Intravenous Q24H  . furosemide 20 mg Oral Daily  . gabapentin 600 mg Oral BID  . glimepiride 4 mg Oral Q breakfast  . insulin aspart 0-15 Units Subcutaneous TID WC  . levothyroxine 112 mcg Oral QAC breakfast  . losartan 100 mg Oral Daily  . spironolactone 25 mg Oral Daily    OBJECTIVE  Filed Vitals:   04/01/14 2230 04/01/14 2305 04/02/14 0544 04/02/14 0649  BP: 115/70 123/59 114/44 126/60  Pulse: 75 75 70 75  Temp:  98.1 F (36.7 C) 97.9 F (36.6 C)   TempSrc:  Oral Oral   Resp: 15 16 16    Height:      Weight:  164 lb 12.8 oz (74.753 kg)    SpO2: 98% 100% 97%     Intake/Output Summary (Last 24 hours) at 04/02/14 0823 Last data filed at 04/02/14 6269  Gross per 24 hour  Intake  0 ml  Output  650 ml  Net  -650 ml   Filed Weights   04/01/14 1633 04/01/14 2305  Weight: 160 lb (72.576 kg) 164 lb 12.8 oz (74.753 kg)    PHYSICAL EXAM  General: Pleasant, NAD. Neuro: Alert and oriented X 3. Moves all extremities spontaneously. Psych: Normal affect. HEENT: Normal Neck: Supple without bruits or JVD. Lungs: Resp regular and  unlabored, CTA. Heart: RRR no s3, s4, or murmurs. Abdomen: Soft, non-tender, non-distended, BS + x 4.  Extremities: No clubbing, cyanosis, left slightly larger than right, minimal erythema. DP/PT/Radials 2+ and equal bilaterally.  Accessory Clinical Findings  CBC  Recent Labs (last 2 labs)      Recent Labs  04/01/14 2345 04/02/14 0300  WBC 6.0 5.9  NEUTROABS 4.1 --   HGB 11.3* 11.5*  HCT 34.9* 35.3*  MCV 83.1 84.7  PLT 245 237     Basic Metabolic Panel  Recent Labs (last 2 labs)      Recent Labs  04/01/14 1711 04/02/14 0300  NA 133* 138  K 4.6 4.1  CL 97 101  CO2 23 24  GLUCOSE 446* 290*  BUN 16 15  CREATININE 0.70 0.75  CALCIUM 9.4 8.8  MG --  2.0     Liver Function Tests  Recent Labs (last 2 labs)      Recent Labs  04/02/14 0300  AST 15  ALT 15  ALKPHOS 85  BILITOT 0.2*  PROT 6.3  ALBUMIN 3.5      Recent Labs (last 2 labs)     No results for input(s): LIPASE, AMYLASE in the last 72 hours.   Cardiac Enzymes  Recent Labs (last 2 labs)      Recent Labs  04/01/14 1711 04/01/14 2345 04/02/14 0456  TROPONINI 1.05* 0.80* 0.79*  BNP  Recent Labs (last 2 labs)     Invalid input(s): POCBNP   D-Dimer  Recent Labs (last 2 labs)      Recent Labs  04/01/14 2140  DDIMER 0.85*      Recent Labs (last 2 labs)      Recent Labs  04/01/14 2345  TSH 7.250*      Radiology/Studies   Imaging Results    Dg Chest 2 View  04/01/2014 CLINICAL DATA: Chest pain, left arm pain. EXAM: CHEST 2 VIEW COMPARISON: Chest radiograph 11/30/2012 FINDINGS: Stable cardiac and mediastinal contours. No consolidative pulmonary opacities. No pleural effusion or pneumothorax. Mid thoracic spine degenerative change. IMPRESSION: No acute cardiopulmonary process. Electronically Signed By: Lovey Newcomer M.D. On: 04/01/2014 19:11     TELE: SR  ECG: SR, negative T waves  in the lateral leads    ASSESSMENT AND PLAN  78 year old with a history of ASCAD with NSTEMI in 2013 with 99% LAD, 70% ostial D1, 30% prox and mid CFX, 70-80% mOM1, 40% prox RCA, 40% distal RCA with right to left collaterals s/p PCI with DES to prox LAD. EF at time of cath was 45-50% and by echo 55-60%.  1. NSTEMI currently pain free. EKG is nonischemic. Her symptoms are a little atypical in that she has had left arm and jaw achiness only at night for 2 weeks but then started with CP last night. No associated symptoms of SOB or diaphoresis. Also need to consider acute PE with history of LLE edema recently, although apparently she had a recent LLE venous doppler that was negative. She has a history of PCI of LAD with residual borderline disease in left circ/OM and RCA by cath 2013  - troponin now downtrending 1.0--> 0.79 - new negative/non-specific changes in the lateral leads - continue iv Heparin - continue iv NTG drip, ASA, BB, ARB, high dose of lipitor (started yesterday, he was not taking it at home - worrying about side effects)  - cath in the am, hold metformin- echo for new WMA is pending  2. ASCAD s/p remote NSTEMI 05/2011 with PCI of LAD  3. HTN well controlled  4. Dyslipidemia - she had been on a statin but stopped because she "read it could cause bad things".  5. DM on metformin and Amaryl  6. H/O PVD  7. Chronic diastolic CHF - appears euvolemic  8. LLE edema for several weeks with normal venous dopplers 2 weeks ago. LLE is mildly erythematous  WBC is normal, receive 1 dose of rocephin iv yesterday for possible cellulitis, seems improved, no signs of sepsis. Wouldn't hold cath because of this.     Signed, Dorothy Spark MD, [facility] 04/02/2014

## 2014-04-02 NOTE — Progress Notes (Signed)
  Echocardiogram 2D Echocardiogram has been performed.  Lysle Rubens 04/02/2014, 3:51 PM

## 2014-04-02 NOTE — Progress Notes (Signed)
ANTICOAGULATION CONSULT NOTE - Follow Up Consult  Pharmacy Consult for heparin Indication: NSTEMI  Labs:  Recent Labs  04/01/14 1711 04/01/14 2345 04/02/14 0300  HGB 12.2 11.3* 11.5*  HCT 38.2 34.9* 35.3*  PLT 252 245 237  APTT  --  57*  --   LABPROT 12.5 13.4  --   INR 0.92 1.01  --   HEPARINUNFRC  --   --  0.27*  CREATININE 0.70  --  0.75  TROPONINI 1.05* 0.80*  --     Assessment: 78yo female slightly subtherapeutic on heparin with initial dosing for NSTEMI.  Goal of Therapy:  Heparin level 0.3-0.7 units/ml   Plan:  Will increase heparin gtt slightly to 900 units/hr and check level in [place], PharmD, BCPS  04/02/2014,4:24 AM

## 2014-04-03 ENCOUNTER — Encounter (HOSPITAL_COMMUNITY): Payer: Self-pay | Admitting: General Practice

## 2014-04-03 ENCOUNTER — Encounter (HOSPITAL_COMMUNITY)
Admission: EM | Disposition: A | Discharge status: Home / Self Care | Payer: Medicare Other | Source: Home / Self Care | Attending: Internal Medicine

## 2014-04-03 DIAGNOSIS — I214 Non-ST elevation (NSTEMI) myocardial infarction: Principal | ICD-10-CM

## 2014-04-03 DIAGNOSIS — I2510 Atherosclerotic heart disease of native coronary artery without angina pectoris: Secondary | ICD-10-CM

## 2014-04-03 HISTORY — PX: LEFT HEART CATHETERIZATION WITH CORONARY ANGIOGRAM: SHX5451

## 2014-04-03 LAB — CBC
HEMATOCRIT: 35.3 % — AB (ref 36.0–46.0)
HEMOGLOBIN: 11.1 g/dL — AB (ref 12.0–15.0)
MCH: 26.4 pg (ref 26.0–34.0)
MCHC: 31.4 g/dL (ref 30.0–36.0)
MCV: 83.8 fL (ref 78.0–100.0)
Platelets: 244 10*3/uL (ref 150–400)
RBC: 4.21 MIL/uL (ref 3.87–5.11)
RDW: 14.3 % (ref 11.5–15.5)
WBC: 6.8 10*3/uL (ref 4.0–10.5)

## 2014-04-03 LAB — GLUCOSE, CAPILLARY
GLUCOSE-CAPILLARY: 184 mg/dL — AB (ref 70–99)
GLUCOSE-CAPILLARY: 207 mg/dL — AB (ref 70–99)
GLUCOSE-CAPILLARY: 260 mg/dL — AB (ref 70–99)
Glucose-Capillary: 203 mg/dL — ABNORMAL HIGH (ref 70–99)

## 2014-04-03 LAB — OCCULT BLOOD X 1 CARD TO LAB, STOOL: Fecal Occult Bld: NEGATIVE

## 2014-04-03 LAB — HEPARIN LEVEL (UNFRACTIONATED): HEPARIN UNFRACTIONATED: 0.54 [IU]/mL (ref 0.30–0.70)

## 2014-04-03 LAB — POCT ACTIVATED CLOTTING TIME: ACTIVATED CLOTTING TIME: 528 s

## 2014-04-03 SURGERY — LEFT HEART CATHETERIZATION WITH CORONARY ANGIOGRAM
Anesthesia: LOCAL

## 2014-04-03 MED ORDER — FENTANYL CITRATE 0.05 MG/ML IJ SOLN
INTRAMUSCULAR | Status: AC
  Filled 2014-04-03: qty 2

## 2014-04-03 MED ORDER — HEART ATTACK BOUNCING BOOK
Freq: Once | Status: AC
  Administered 2014-04-03: 21:00:00
  Filled 2014-04-03: qty 1

## 2014-04-03 MED ORDER — VERAPAMIL HCL 2.5 MG/ML IV SOLN
INTRAVENOUS | Status: AC
  Filled 2014-04-03: qty 2

## 2014-04-03 MED ORDER — MIDAZOLAM HCL 2 MG/2ML IJ SOLN
INTRAMUSCULAR | Status: AC
  Filled 2014-04-03: qty 2

## 2014-04-03 MED ORDER — DICLOFENAC SODIUM 1 % TD GEL
2.0000 g | Freq: Every day | TRANSDERMAL | Status: DC | PRN
  Filled 2014-04-03: qty 100

## 2014-04-03 MED ORDER — LIVING WELL WITH DIABETES BOOK
Freq: Once | Status: AC
  Administered 2014-04-03: 21:00:00
  Filled 2014-04-03: qty 1

## 2014-04-03 MED ORDER — TICAGRELOR 90 MG PO TABS
ORAL_TABLET | ORAL | Status: AC
  Filled 2014-04-03: qty 1

## 2014-04-03 MED ORDER — ACETAMINOPHEN 325 MG PO TABS: 650.0000 mg | ORAL_TABLET | ORAL | Status: DC | PRN

## 2014-04-03 MED ORDER — LOSARTAN POTASSIUM 50 MG PO TABS: 100.0000 mg | ORAL_TABLET | Freq: Every day | ORAL | Status: DC

## 2014-04-03 MED ORDER — BIVALIRUDIN 250 MG IV SOLR
INTRAVENOUS | Status: AC
  Filled 2014-04-03: qty 250

## 2014-04-03 MED ORDER — AMITRIPTYLINE HCL 10 MG PO TABS
10.0000 mg | ORAL_TABLET | Freq: Every day | ORAL | Status: DC
  Administered 2014-04-03: 10 mg via ORAL
  Filled 2014-04-03 (×2): qty 1

## 2014-04-03 MED ORDER — ONDANSETRON HCL 4 MG/2ML IJ SOLN: 4.0000 mg | Freq: Four times a day (QID) | INTRAMUSCULAR | Status: DC | PRN

## 2014-04-03 MED ORDER — HEPARIN (PORCINE) IN NACL 2-0.9 UNIT/ML-% IJ SOLN
INTRAMUSCULAR | Status: AC
  Filled 2014-04-03: qty 1000

## 2014-04-03 MED ORDER — SODIUM CHLORIDE 0.9 % IV SOLN: INTRAVENOUS | Status: AC

## 2014-04-03 MED ORDER — LIDOCAINE HCL (PF) 1 % IJ SOLN
INTRAMUSCULAR | Status: AC
  Filled 2014-04-03: qty 30

## 2014-04-03 MED ORDER — FUROSEMIDE 10 MG/ML IJ SOLN
40.0000 mg | Freq: Once | INTRAMUSCULAR | Status: AC
  Administered 2014-04-03: 40 mg via INTRAVENOUS
  Filled 2014-04-03: qty 4

## 2014-04-03 MED ORDER — TICAGRELOR 90 MG PO TABS
90.0000 mg | ORAL_TABLET | Freq: Two times a day (BID) | ORAL | Status: DC
  Administered 2014-04-04: 90 mg via ORAL
  Filled 2014-04-03 (×3): qty 1

## 2014-04-03 MED ORDER — NITROGLYCERIN 1 MG/10 ML FOR IR/CATH LAB
INTRA_ARTERIAL | Status: AC
  Filled 2014-04-03: qty 10

## 2014-04-03 MED ORDER — ASPIRIN 81 MG PO CHEW: 81.0000 mg | CHEWABLE_TABLET | Freq: Every day | ORAL | Status: DC

## 2014-04-03 NOTE — H&P (View-Only) (Signed)
Patient Profile: 78 y/o female with known h/o HTN, HLD, DM and CAD s/p NSTEMI in 2013 with PCI + DES to proximal LAD and EF of 55-60% at that time, readmitted 04/01/14 for chest pain and ruled in for NSTEMI.    Subjective: No complaints. Denies  further CP. No dyspnea.   Objective: Vital signs in last 24 hours: Temp:  [97.6 F (36.4 C)-98.4 F (36.9 C)] 97.6 F (36.4 C) (12/21 0344) Pulse Rate:  [68-84] 78 (12/21 0344) Resp:  [17-18] 18 (12/21 0344) BP: (108-124)/(49-93) 108/49 mmHg (12/21 0344) SpO2:  [95 %-99 %] 95 % (12/21 0344) Weight:  [163 lb 14.4 oz (74.345 kg)] 163 lb 14.4 oz (74.345 kg) (12/21 0344) Last BM Date: 03/30/14  Intake/Output from previous day: 12/20 0701 - 12/21 0700 In: 360 [P.O.:360] Out: -  Intake/Output this shift:    Medications Current Facility-Administered Medications  Medication Dose Route Frequency Provider Last Rate Last Dose  . 0.9 %  sodium chloride infusion  250 mL Intravenous PRN Dorothy Spark, MD      . 0.9 %  sodium chloride infusion   Intravenous Continuous Dorothy Spark, MD 50 mL/hr at 04/03/14 5400    . acetaminophen (TYLENOL) tablet 650 mg  650 mg Oral Q4H PRN Sueanne Margarita, MD      . amitriptyline (ELAVIL) tablet 25 mg  25 mg Oral QHS Sueanne Margarita, MD   25 mg at 04/02/14 2216  . aspirin EC tablet 81 mg  81 mg Oral Daily Sueanne Margarita, MD   81 mg at 04/02/14 1042  . atorvastatin (LIPITOR) tablet 80 mg  80 mg Oral q1800 Sueanne Margarita, MD   80 mg at 04/02/14 1742  . carvedilol (COREG) tablet 3.125 mg  3.125 mg Oral BID WC Sueanne Margarita, MD   3.125 mg at 04/03/14 0609  . cefTRIAXone (ROCEPHIN) 1 g in dextrose 5 % 50 mL IVPB  1 g Intravenous Q24H Sueanne Margarita, MD   1 g at 04/02/14 2216  . furosemide (LASIX) tablet 20 mg  20 mg Oral Daily Sueanne Margarita, MD   20 mg at 04/02/14 1042  . gabapentin (NEURONTIN) tablet 600 mg  600 mg Oral BID Sueanne Margarita, MD   600 mg at 04/02/14 2217  . glimepiride (AMARYL) tablet 4 mg  4 mg  Oral Q breakfast Sueanne Margarita, MD   4 mg at 04/02/14 0650  . heparin ADULT infusion 100 units/mL (25000 units/250 mL)  1,150 Units/hr Intravenous Continuous Georgina Peer, [facility] 11.5 mL/hr at 04/02/14 2217 1,150 Units/hr at 04/02/14 2217  . insulin aspart (novoLOG) injection 0-15 Units  0-15 Units Subcutaneous TID WC Sueanne Margarita, MD   3 Units at 04/03/14 0609  . insulin aspart (novoLOG) injection 0-5 Units  0-5 Units Subcutaneous QHS Stephani Police, MD   3 Units at 04/02/14 2258  . levothyroxine (SYNTHROID, LEVOTHROID) tablet 112 mcg  112 mcg Oral QAC breakfast Sueanne Margarita, MD   112 mcg at 04/03/14 0609  . losartan (COZAAR) tablet 100 mg  100 mg Oral Daily Sueanne Margarita, MD   100 mg at 04/02/14 1042  . nitroGLYCERIN (NITROSTAT) SL tablet 0.4 mg  0.4 mg Sublingual Q5 Min x 3 PRN Sueanne Margarita, MD      . ondansetron (ZOFRAN) injection 4 mg  4 mg Intravenous Q6H PRN Sueanne Margarita, MD      . polyethylene glycol (MIRALAX / GLYCOLAX) packet 17 g  17 g Oral Daily PRN Sueanne Margarita, MD      . sodium chloride 0.9 % injection 3 mL  3 mL Intravenous Q12H Dorothy Spark, MD   3 mL at 04/02/14 2200  . sodium chloride 0.9 % injection 3 mL  3 mL Intravenous PRN Dorothy Spark, MD      . spironolactone (ALDACTONE) tablet 25 mg  25 mg Oral Daily Sueanne Margarita, MD   25 mg at 04/02/14 1042    PE: General appearance: alert, cooperative and no distress Neck: no carotid bruit and no JVD Lungs: clear to auscultation bilaterally Heart: regular rate and rhythm, S1, S2 normal, no murmur, click, rub or gallop Extremities: no LEE Pulses: 2+ and symmetric Skin: warm and dry Neurologic: Grossly normal  Lab Results:   Recent Labs  04/01/14 2345 04/02/14 0300 04/03/14 0536  WBC 6.0 5.9 6.8  HGB 11.3* 11.5* 11.1*  HCT 34.9* 35.3* 35.3*  PLT 245 237 244   BMET  Recent Labs  04/01/14 1711 04/02/14 0300  NA 133* 138  K 4.6 4.1  CL 97 101  CO2 23 24  GLUCOSE 446* 290*  BUN 16 15    CREATININE 0.70 0.75  CALCIUM 9.4 8.8   PT/INR  Recent Labs  04/01/14 1711 04/01/14 2345  LABPROT 12.5 13.4  INR 0.92 1.01   Cardiac Panel (last 3 results)  Recent Labs  04/01/14 2345 04/02/14 0456 04/02/14 1025  TROPONINI 0.80* 0.79* 0.41*    Studies/Results: 2D echo 04/02/14 Study Conclusions  - Left ventricle: There apperas to be hypokinesis in the basal inferior and inferoseptal walls. Overall LVEF is preserved at 60-65%. The cavity size was normal. There was severe focal basal and moderate concentric hypertrophy of the left ventricle. Systolic function was normal. The estimated ejection fraction was in the range of 60% to 65%. Doppler parameters are consistent with abnormal left ventricular relaxation (grade 1 diastolic dysfunction). Doppler parameters are consistent with elevated ventricular end-diastolic filling pressure. - Aortic valve: Trileaflet; normal thickness leaflets. There was no regurgitation. - Aortic root: The aortic root was normal in size. - Ascending aorta: The ascending aorta was normal in size. - Right ventricle: Systolic function was normal. - Right atrium: The atrium was normal in size. - Tricuspid valve: There was mild regurgitation. - Pulmonary arteries: Systolic pressure was within the normal range. - Inferior vena cava: The vessel was normal in size. - Pericardium, extracardiac: There was no pericardial effusion.  Assessment/Plan  Active Problems:   DM type 2 (diabetes mellitus, type 2)   Coronary atherosclerosis of native coronary artery   Hyperlipemia   NSTEMI (non-ST elevated myocardial infarction)   Essential hypertension  1. NSTEMI: Currently CP free on IV heparin. Plan for [facility] +/PCI today to reassess coronary anatomy and patency of previously placed LAD stent. Continue ASA, high dose statin, BB and ARB.   2. HTN: BP is controlled. Continue current regimen.   3. HLD: on high dose statin, 80 mg of  Lipitor.  4. DM: Metformin on hold for cath. Continue SS insulin.    LOS: 2 days    Brittainy M. Ladoris Gene 04/03/2014 7:06 AM  The patient was seen, examined and discussed with Brittainy M. Rosita Fire, PA-C and I agree with the above.   NSTEMI, Cath today, on iv Heparin, no chest pain. Appears mildly fluid overloaded, we will give 1 dose of iv Lasix today and reevaluate tomorrow.  Dorothy Spark 04/03/2014

## 2014-04-03 NOTE — CV Procedure (Signed)
PROCEDURE:  Left heart catheterization with selective coronary angiography, PCI OM1, PCI distal RCA.  INDICATIONS:  NSTEMI  The risks, benefits, and details of the procedure were explained to the patient.  The patient verbalized understanding and wanted to proceed.  Informed written consent was obtained.  PROCEDURE TECHNIQUE:  After Xylocaine anesthesia a 7F slender sheath was placed in the right radial artery with a single anterior needle wall stick.   IV Heparin was given.  Right coronary angiography was done using a Judkins R4 guide catheter.  Left coronary angiography was done using a Judkins L3.5 guide catheter.  Left heart catheterization was done using a pigtail catheter. Interventions were performed. A TR band was used for hemostasis.   CONTRAST:  Total of 140 cc.  COMPLICATIONS:  None.    HEMODYNAMICS:  Aortic pressure was 117/46; LV pressure was 118/2; LVEDP 8.  There was no gradient between the left ventricle and aorta.    ANGIOGRAPHIC DATA:   The left main coronary artery is widely patent.  The left anterior descending artery is a large vessel. There is a long stent in the proximal to mid vessel which is widely patent. Just before the stent, there is a focal 25% lesion. There is a medium-sized diagonal which was jailed by the stent but is patent with mild ostial disease.  The left circumflex artery is a large vessel. The OM1 is also large vessel. The remainder of the circumflex is small. The proximal vessel has only mild disease. In the OM1, there is a moderate proximal stenosis. In the mid vessel, there is a focal 95% stenosis.  There is a large ramus which is widely patent.   The right coronary artery is a large dominant vessel. There is moderate disease in the mid vessel. The distal RCA, there is a 99% stenosis, just before the bifurcation of the posterior lateral and posterior descending artery. After the intervention, it was noted that both the posterior descending and  posterior lateral arteries are medium-sized and widely patent.  LEFT VENTRICULOGRAM:  Left ventricular angiogram was not done.  LVEDP was 8 mmHg.  PCI NARRATIVE: IV Angiomax was used for anticoagulation. ACT was used to check that the anticoagulation was therapeutic. The patient was loaded with Brilinta.  An EBU 3.0 guiding catheter was used to engage the left main.  A pro-water wire was placed across the area disease in the circumflex and OM1. A 2.0 x 20 balloon was used to predilate. A 2.25 x 24 Promus drug-eluting stent was then used to treat the entire diseased segment. The stent was deployed and subsequently postdilated with a 2.5 x 15 noncompliant balloon, inflated to high pressure. Intracoronary nitroglycerin was given. There was an excellent angiographic result.  Attention was turned to the RCA. A shepherd's crook right guide catheter was used to engage the RCA.  A pro-water wire was placed across the area disease in the RCA. A 2.0 x 20 balloon was used to predilate. A 2.25 x 24 Promus drug-eluting stent was then used to treat the entire diseased segment.  It was difficult to judge exact length of the lesion due to a bend that occurred just before the lesion. The stent was deployed and subsequently postdilated with a 2.5 x 15 noncompliant balloon, inflated to high pressure. Intracoronary nitroglycerin was given. There was an excellent angiographic result.  IMPRESSIONS:  1. Normal left main coronary artery. 2. Mild disease in the left anterior descending artery and its branches. Patent  stent in the proximal to mid LAD. 3. Severe disease in the left circumflex artery system with a 95% lesion in the  OM1 branch.  This was successfully treated with a 2.25 x 24 Promus drug-eluting stent, postdilated to greater than 2.5 mm in diameter. 4. 99% distal right coronary artery stenosis.  This was successfully treated with a 2.25 x 24 Promus drug-eluting stent, postdilated to greater than 2.5 mm in  diameter. 5. Left ventricular systolic function was not assessed.  LVEDP 8 mmHg.    RECOMMENDATION:  Continue dual antiplatelet therapy for at least a year and likely longer given that she has stents in all 3 major vessels. She needs aggressive risk factor modification. Plan for discharge tomorrow.

## 2014-04-03 NOTE — Progress Notes (Signed)
[place] for Heparin Indication: chest pain/ACS  Allergies  Allergen Reactions  . Tetanus Toxoids Swelling and Rash    Patient Measurements: Height: 5\' 2"  (157.5 cm) Weight: 163 lb 14.4 oz (74.345 kg) IBW/kg (Calculated) : 50.1 Heparin Dosing Weight: 65.6kg  Vital Signs: Temp: 97.6 F (36.4 C) (12/21 0344) Temp Source: Oral (12/21 0344) BP: 108/49 mmHg (12/21 0344) Pulse Rate: 78 (12/21 0344)  Labs:  Recent Labs  04/01/14 1711 04/01/14 2345  04/02/14 0300 04/02/14 0456 04/02/14 1025 04/02/14 1215 04/02/14 2007 04/03/14 0536  HGB 12.2 11.3*  --  11.5*  --   --   --   --  11.1*  HCT 38.2 34.9*  --  35.3*  --   --   --   --  35.3*  PLT 252 245  --  237  --   --   --   --  244  APTT  --  57*  --   --   --   --   --   --   --   LABPROT 12.5 13.4  --   --   --   --   --   --   --   INR 0.92 1.01  --   --   --   --   --   --   --   HEPARINUNFRC  --   --   < > 0.27*  --   --  0.24* 0.30 0.54  CREATININE 0.70  --   --  0.75  --   --   --   --   --   TROPONINI 1.05* 0.80*  --   --  0.79* 0.41*  --   --   --   < > = values in this interval not displayed.  Estimated Creatinine Clearance: 56.5 mL/min (by C-G formula based on Cr of 0.75).   Medical History: Past Medical History  Diagnosis Date  . Diabetes mellitus   . Hypothyroid   . HLD (hyperlipidemia)   . CAD (coronary artery disease)     NSTEMI 05/2011 - [facility] 06/04/11: LAD 99%, oD1 70% at LAD lesion, prox and mid CFX 30%, mOM1 70-80%, pRCA 40%, distal 40% with some collaterals to the LAD, EF 45-50% with apical dyskinesis and no thrombus.  PCI: Promus DES to the proximal LAD.;  Echo 05/27/11: LVH, EF 50-93%, grade 1 diastolic dysfunction.  [name] HTN (hypertension)   . Myocardial infarction   . CHF (congestive heart failure)   . GERD (gastroesophageal reflux disease)   . Arthritis     arthritis hands, knees  . Refusal of blood transfusions as patient is Jehovah's Witness 09-29-12  .  Peripheral vascular disease 09-29-12    "was told has a right leg blockage" was given 08-15-12 RX  for Cilostazol,"not taking yet"  . MI (myocardial infarction)    Assessment: 78yof to on heparin for CP/ACS. NSTEMI - H/H and Plts wnl - No significant bleeding reported - follow up heparin level therapeutic 0.54. Will recheck in 8 hours to ensure remains therapeutic.  Goal of Therapy:  Heparin level 0.3-0.7 units/ml Monitor platelets by anticoagulation protocol: Yes   Plan:  Heparin gtt at 1150 units/hr Heparin level in 8 hours Daily HL/CBC For PCI today  Isac Sarna, BS Pharm D, BCPS Clinical Pharmacist  04/03/2014 9:49 AM

## 2014-04-03 NOTE — Progress Notes (Signed)
Patient Profile: 78 y/o female with known h/o HTN, HLD, DM and CAD s/p NSTEMI in 2013 with PCI + DES to proximal LAD and EF of 55-60% at that time, readmitted 04/01/14 for chest pain and ruled in for NSTEMI.    Subjective: No complaints. Denies  further CP. No dyspnea.   Objective: Vital signs in last 24 hours: Temp:  [97.6 F (36.4 C)-98.4 F (36.9 C)] 97.6 F (36.4 C) (12/21 0344) Pulse Rate:  [68-84] 78 (12/21 0344) Resp:  [17-18] 18 (12/21 0344) BP: (108-124)/(49-93) 108/49 mmHg (12/21 0344) SpO2:  [95 %-99 %] 95 % (12/21 0344) Weight:  [163 lb 14.4 oz (74.345 kg)] 163 lb 14.4 oz (74.345 kg) (12/21 0344) Last BM Date: 03/30/14  Intake/Output from previous day: 12/20 0701 - 12/21 0700 In: 360 [P.O.:360] Out: -  Intake/Output this shift:    Medications Current Facility-Administered Medications  Medication Dose Route Frequency Provider Last Rate Last Dose  . 0.9 %  sodium chloride infusion  250 mL Intravenous PRN Dorothy Spark, MD      . 0.9 %  sodium chloride infusion   Intravenous Continuous Dorothy Spark, MD 50 mL/hr at 04/03/14 2683    . acetaminophen (TYLENOL) tablet 650 mg  650 mg Oral Q4H PRN Sueanne Margarita, MD      . amitriptyline (ELAVIL) tablet 25 mg  25 mg Oral QHS Sueanne Margarita, MD   25 mg at 04/02/14 2216  . aspirin EC tablet 81 mg  81 mg Oral Daily Sueanne Margarita, MD   81 mg at 04/02/14 1042  . atorvastatin (LIPITOR) tablet 80 mg  80 mg Oral q1800 Sueanne Margarita, MD   80 mg at 04/02/14 1742  . carvedilol (COREG) tablet 3.125 mg  3.125 mg Oral BID WC Sueanne Margarita, MD   3.125 mg at 04/03/14 0609  . cefTRIAXone (ROCEPHIN) 1 g in dextrose 5 % 50 mL IVPB  1 g Intravenous Q24H Sueanne Margarita, MD   1 g at 04/02/14 2216  . furosemide (LASIX) tablet 20 mg  20 mg Oral Daily Sueanne Margarita, MD   20 mg at 04/02/14 1042  . gabapentin (NEURONTIN) tablet 600 mg  600 mg Oral BID Sueanne Margarita, MD   600 mg at 04/02/14 2217  . glimepiride (AMARYL) tablet 4 mg  4 mg  Oral Q breakfast Sueanne Margarita, MD   4 mg at 04/02/14 0650  . heparin ADULT infusion 100 units/mL (25000 units/250 mL)  1,150 Units/hr Intravenous Continuous Georgina Peer, [facility] 11.5 mL/hr at 04/02/14 2217 1,150 Units/hr at 04/02/14 2217  . insulin aspart (novoLOG) injection 0-15 Units  0-15 Units Subcutaneous TID WC Sueanne Margarita, MD   3 Units at 04/03/14 0609  . insulin aspart (novoLOG) injection 0-5 Units  0-5 Units Subcutaneous QHS Stephani Police, MD   3 Units at 04/02/14 2258  . levothyroxine (SYNTHROID, LEVOTHROID) tablet 112 mcg  112 mcg Oral QAC breakfast Sueanne Margarita, MD   112 mcg at 04/03/14 0609  . losartan (COZAAR) tablet 100 mg  100 mg Oral Daily Sueanne Margarita, MD   100 mg at 04/02/14 1042  . nitroGLYCERIN (NITROSTAT) SL tablet 0.4 mg  0.4 mg Sublingual Q5 Min x 3 PRN Sueanne Margarita, MD      . ondansetron (ZOFRAN) injection 4 mg  4 mg Intravenous Q6H PRN Sueanne Margarita, MD      . polyethylene glycol (MIRALAX / GLYCOLAX) packet 17 g  17 g Oral Daily PRN Sueanne Margarita, MD      . sodium chloride 0.9 % injection 3 mL  3 mL Intravenous Q12H Dorothy Spark, MD   3 mL at 04/02/14 2200  . sodium chloride 0.9 % injection 3 mL  3 mL Intravenous PRN Dorothy Spark, MD      . spironolactone (ALDACTONE) tablet 25 mg  25 mg Oral Daily Sueanne Margarita, MD   25 mg at 04/02/14 1042    PE: General appearance: alert, cooperative and no distress Neck: no carotid bruit and no JVD Lungs: clear to auscultation bilaterally Heart: regular rate and rhythm, S1, S2 normal, no murmur, click, rub or gallop Extremities: no LEE Pulses: 2+ and symmetric Skin: warm and dry Neurologic: Grossly normal  Lab Results:   Recent Labs  04/01/14 2345 04/02/14 0300 04/03/14 0536  WBC 6.0 5.9 6.8  HGB 11.3* 11.5* 11.1*  HCT 34.9* 35.3* 35.3*  PLT 245 237 244   BMET  Recent Labs  04/01/14 1711 04/02/14 0300  NA 133* 138  K 4.6 4.1  CL 97 101  CO2 23 24  GLUCOSE 446* 290*  BUN 16 15    CREATININE 0.70 0.75  CALCIUM 9.4 8.8   PT/INR  Recent Labs  04/01/14 1711 04/01/14 2345  LABPROT 12.5 13.4  INR 0.92 1.01   Cardiac Panel (last 3 results)  Recent Labs  04/01/14 2345 04/02/14 0456 04/02/14 1025  TROPONINI 0.80* 0.79* 0.41*    Studies/Results: 2D echo 04/02/14 Study Conclusions  - Left ventricle: There apperas to be hypokinesis in the basal inferior and inferoseptal walls. Overall LVEF is preserved at 60-65%. The cavity size was normal. There was severe focal basal and moderate concentric hypertrophy of the left ventricle. Systolic function was normal. The estimated ejection fraction was in the range of 60% to 65%. Doppler parameters are consistent with abnormal left ventricular relaxation (grade 1 diastolic dysfunction). Doppler parameters are consistent with elevated ventricular end-diastolic filling pressure. - Aortic valve: Trileaflet; normal thickness leaflets. There was no regurgitation. - Aortic root: The aortic root was normal in size. - Ascending aorta: The ascending aorta was normal in size. - Right ventricle: Systolic function was normal. - Right atrium: The atrium was normal in size. - Tricuspid valve: There was mild regurgitation. - Pulmonary arteries: Systolic pressure was within the normal range. - Inferior vena cava: The vessel was normal in size. - Pericardium, extracardiac: There was no pericardial effusion.  Assessment/Plan  Active Problems:   DM type 2 (diabetes mellitus, type 2)   Coronary atherosclerosis of native coronary artery   Hyperlipemia   NSTEMI (non-ST elevated myocardial infarction)   Essential hypertension  1. NSTEMI: Currently CP free on IV heparin. Plan for [facility] +/PCI today to reassess coronary anatomy and patency of previously placed LAD stent. Continue ASA, high dose statin, BB and ARB.   2. HTN: BP is controlled. Continue current regimen.   3. HLD: on high dose statin, 80 mg of  Lipitor.  4. DM: Metformin on hold for cath. Continue SS insulin.    LOS: 2 days    Brittainy M. Ladoris Gene 04/03/2014 7:06 AM  The patient was seen, examined and discussed with Brittainy M. Rosita Fire, PA-C and I agree with the above.   NSTEMI, Cath today, on iv Heparin, no chest pain. Appears mildly fluid overloaded, we will give 1 dose of iv Lasix today and reevaluate tomorrow.  Dorothy Spark 04/03/2014

## 2014-04-03 NOTE — Progress Notes (Signed)
Inpatient Diabetes Program Recommendations  AACE/ADA: New Consensus Statement on Inpatient Glycemic Control (2013)  Target Ranges:  Prepandial:   less than 140 mg/dL      Peak postprandial:   less than 180 mg/dL (1-2 hours)      Critically ill patients:  140 - 180 mg/dL  Results for MELESSIA, KAUS (MRN [id number]) as of 04/03/2014 11:35  Ref. Range 04/02/2014 11:39 04/02/2014 16:31 04/02/2014 21:22 04/03/2014 05:53 04/03/2014 11:19  Glucose-Capillary Latest Range: 70-99 mg/dL 212 (H) 169 (H) 268 (H) 184 (H) 207 (H)   Inpatient Diabetes Program Recommendations Insulin - Basal: consider adding Lantus 10 units  Thank you  Raoul Pitch BSN, RN,CDE Inpatient Diabetes Coordinator [phone] (team pager)

## 2014-04-03 NOTE — Interval H&P Note (Signed)
Cath Lab Visit (complete for each Cath Lab visit)  Clinical Evaluation Leading to the Procedure:   ACS: Yes.    Non-ACS:    Anginal Classification: CCS IV  Anti-ischemic medical therapy: Minimal Therapy (1 class of medications)  Non-Invasive Test Results: No non-invasive testing performed  Prior CABG: No previous CABG   TIMI Score  Patient Information:  TIMI Score is 5  Revascularization of the presumed culprit artery  A (9)  Indication: 11; Score: 9 TIMI Score  Patient Information:  TIMI Score is 5  Revascularization of multiple coronary arteries when the culprit artery cannot clearly be determined  A (9)  Indication: 12; Score: 9    History and Physical Interval Note:  04/03/2014 1:48 PM  Dietrich W Colasurdo  has presented today for surgery, with the diagnosis of NSTEMI  The various methods of treatment have been discussed with the patient and family. After consideration of risks, benefits and other options for treatment, the patient has consented to  Procedure(s): LEFT HEART CATHETERIZATION WITH CORONARY ANGIOGRAM (N/A) as a surgical intervention .  The patient's history has been reviewed, patient examined, no change in status, stable for surgery.  I have reviewed the patient's chart and labs.  Questions were answered to the patient's satisfaction.     Clydine Parkison S.

## 2014-04-03 NOTE — Care Management Note (Addendum)
    Page 1 of 1   04/04/2014     10:51:31 AM CARE MANAGEMENT NOTE 04/04/2014  Patient:  Alyssa [name], Alyssa [name]   Account Number:  [id number]  Date Initiated:  04/03/2014  Documentation initiated by:  Eboni Coval  Subjective/Objective Assessment:   Left heart Cath     Action/Plan:   CM to follow for disposition needs   Anticipated DC Date:  04/04/2014   Anticipated DC Plan:  HOME/SELF CARE         Choice offered to / List presented to:             Status of service:  Completed, signed off Medicare Important Message given?   (If response is "NO", the following Medicare IM given date fields will be blank) Date Medicare IM given:   Medicare IM given by:   Date Additional Medicare IM given:   Additional Medicare IM given by:    Discharge Disposition:  HOME/SELF CARE  Per UR Regulation:  Reviewed for med. necessity/level of care/duration of stay  If discussed at [place] of Stay Meetings, dates discussed:    Comments:  Tanieka Pownall RN, BSN, MSHL, CCM  Nurse - Case Manager,  (Unit [phone]  04/04/2014 PT COPAY WILL BE $90-PRIOR AUTH IS NOT REQUIRED Brilinta card provided to patient   Mariann Laster RN, BSN, MSHL, CCM  Nurse - Case Manager,  (Unit [phone]  04/03/2014 [place] Check:  ticagrelor (BRILINTA) tablet 90 mg - in progress

## 2014-04-04 ENCOUNTER — Telehealth: Payer: Self-pay | Admitting: Physician Assistant

## 2014-04-04 ENCOUNTER — Encounter (HOSPITAL_COMMUNITY): Payer: Self-pay | Admitting: Physician Assistant

## 2014-04-04 DIAGNOSIS — I5032 Chronic diastolic (congestive) heart failure: Secondary | ICD-10-CM | POA: Diagnosis present

## 2014-04-04 DIAGNOSIS — I739 Peripheral vascular disease, unspecified: Secondary | ICD-10-CM | POA: Diagnosis present

## 2014-04-04 LAB — BASIC METABOLIC PANEL
Anion gap: 9 (ref 5–15)
BUN: 13 mg/dL (ref 6–23)
CHLORIDE: 107 meq/L (ref 96–112)
CO2: 23 mmol/L (ref 19–32)
CREATININE: 0.77 mg/dL (ref 0.50–1.10)
Calcium: 8.8 mg/dL (ref 8.4–10.5)
GFR calc non Af Amer: 80 mL/min — ABNORMAL LOW (ref >90)
Glucose, Bld: 179 mg/dL — ABNORMAL HIGH (ref 70–99)
Potassium: 3.5 mmol/L (ref 3.5–5.1)
Sodium: 139 mmol/L (ref 135–145)

## 2014-04-04 LAB — CBC
HEMATOCRIT: 34.6 % — AB (ref 36.0–46.0)
Hemoglobin: 11.2 g/dL — ABNORMAL LOW (ref 12.0–15.0)
MCH: 26.8 pg (ref 26.0–34.0)
MCHC: 32.4 g/dL (ref 30.0–36.0)
MCV: 82.8 fL (ref 78.0–100.0)
Platelets: 243 10*3/uL (ref 150–400)
RBC: 4.18 MIL/uL (ref 3.87–5.11)
RDW: 14.2 % (ref 11.5–15.5)
WBC: 6.4 10*3/uL (ref 4.0–10.5)

## 2014-04-04 LAB — GLUCOSE, CAPILLARY: Glucose-Capillary: 150 mg/dL — ABNORMAL HIGH (ref 70–99)

## 2014-04-04 MED ORDER — METFORMIN HCL 500 MG PO TABS: 500.0000 mg | ORAL_TABLET | Freq: Two times a day (BID) | ORAL | Status: DC

## 2014-04-04 MED ORDER — ATORVASTATIN CALCIUM 80 MG PO TABS: 80.0000 mg | ORAL_TABLET | Freq: Every day | ORAL | Status: DC

## 2014-04-04 MED ORDER — TICAGRELOR 90 MG PO TABS: 90.0000 mg | ORAL_TABLET | Freq: Two times a day (BID) | ORAL | Status: DC

## 2014-04-04 MED FILL — Sodium Chloride IV Soln 0.9%: INTRAVENOUS | Qty: 50 | Status: AC

## 2014-04-04 NOTE — Progress Notes (Signed)
CARDIAC REHAB PHASE I   PRE:  Rate/Rhythm: 92 SR  BP:  Supine:   Sitting: 109/80  Standing:    SaO2:   MODE:  Ambulation: 440 ft   POST:  Rate/Rhythm: 104 ST  BP:  Supine:   Sitting: 101/58  Standing:    SaO2:  0755-0920 Pt tolerated ambulation well without c/o of cp or SOB. VS stable Pt to side of bed after walk. Completed MI and stent education with pt. She voices understanding. Pt agrees to [place]. CRP in [place],will send referral. Pt states that she really needs to work harder on her diet and exercise.  Rodney Langton RN 04/04/2014 9:23 AM

## 2014-04-04 NOTE — Discharge Summary (Signed)
Discharge Summary   Patient ID: Alyssa [name] MRN: [id number], DOB/AGE: April 18, 1935 78 y.o. Admit date: 04/01/2014 D/C date:     04/04/2014  Primary Cardiologist: Dr. Radford Pax (previoulsy seen by Dr. Haroldine Laws but lost to follow up)  Principal Problem:   NSTEMI (non-ST elevated myocardial infarction) Active Problems:   Hypothyroid   DM type 2 (diabetes mellitus, type 2)   Hyperlipemia   GERD (gastroesophageal reflux disease)   Essential hypertension   Peripheral vascular disease   Chronic diastolic CHF (congestive heart failure)    Admission Dates:  04/01/14-04/04/14 Discharge Diagnosis: NSTEMI s/p DES to OM1 and DES to dRCA  HPI: Alyssa [name] is a 78 y.o. female with a history of CAD with DES to LAD (2013), HTN, chronic diastolic CHF, GERD and PVD who presented to the [facility] ED on 04/01/14 with complaints of chest pain and found to have NSTEMI.  She presented complaining of left arm pain from her shoulder to her elbow had been hurting constant at night for the past few weeks. She also was having jaw pain. She took 2 Ibuprofen and amitriptyline and it did not help. She also reported that she has had LE edema for the past few months, especially her left leg. She has had some erythema of her left foot for several weeks and had a doppler of the LLE 2 weeks ago that reportedly was normal.    Hospital Course  NSTEMI- peak troponin 1.05 -- 2D ECHO 04/02/14 with hypokinesis in the basal inferior and inferoseptal walls. Focal and moderate concentric LVH. Overall LVEF 60-65%. G1DD. Elevated EDLV filling pressures. Mild TR. -- s/p LHC on 04/03/14 which revealed  1. Normal left main coronary artery. 2. Mild disease in the left anterior descending artery and its branches. Patent stent in the proximal to mid LAD. 3. Severe disease in the left circumflex artery system with a 95% lesion in the OM1 branch. This was successfully treated with a 2.25 x 24 Promus drug-eluting stent, postdilated to  greater than 2.5 mm in diameter. 4. 99% distal right coronary artery stenosis. This was successfully treated with a 2.25 x 24 Promus drug-eluting stent, postdilated to greater than 2.5 mm in diameter. 5. Left ventricular systolic function was not assessed. LVEDP 8 mmHg. -- Continue DAPT with ASA/Brilinta for at least a year but ideally longer as she has stents in all her major vessels. Continue high potency statin, BB and ARB.  HTN- well controlled -- Continue losartan 100mg , spironolactone 25mg , lasix 20mg , carvedilol 3.125mg  BID  HLD- she had been on a statin but stopped because she "read it could cause bad things".  -- Started back on atorvastatin 80mg . She is okay with this and has been educated on the importance of stains in the tx of CAD  DM - continue to hold metformin >48 hours post cardiac cath. Okay to resumed on 04/06/14 -- Continue home meds  Chronic diastolic CHF - appears euvolemic -- Continue home lasix 20mg  po qd  LLE edema for several weeks with normal venous dopplers 2 weeks ago. LLE is mildly erythematous ? Early cellulitis. WBC is normal.  -- Mildly elevated D-dimer but no s/s c/w PE. -- Given Rocephin 1gm IV for possible early LLE cellulitis. Looks stable.   Hypothyroidism- elevated TSH (7.2) -- Continue synthroid.  -- Follow up with PCP or endocrinologist as she made need thryoid dose adjustment. I have spoke with the patient about this.   The patient has had an uncomplicated hospital course and  is recovering well. The radial catheter site is stable. She has been seen by Dr. Irish Lack today and deemed ready for discharge home. All follow-up appointments have been scheduled. A written RX for a 30 day free supply of Brilinta was provided for the patient. Discharge medications are listed below.   Discharge Vitals: Blood pressure 109/80, pulse 93, temperature 97.7 F (36.5 C), temperature source Oral, resp. rate 18, height 5\' 2"  (1.575 m), weight 164 lb 14.5 oz  (74.8 kg), SpO2 97 %.  Labs: Lab Results  Component Value Date   WBC 6.4 04/04/2014   HGB 11.2* 04/04/2014   HCT 34.6* 04/04/2014   MCV 82.8 04/04/2014   PLT 243 04/04/2014     Recent Labs Lab 04/02/14 0300 04/04/14 0410  NA 138 139  K 4.1 3.5  CL 101 107  CO2 24 23  BUN 15 13  CREATININE 0.75 0.77  CALCIUM 8.8 8.8  PROT 6.3  --   BILITOT 0.2*  --   ALKPHOS 85  --   ALT 15  --   AST 15  --   GLUCOSE 290* 179*    Recent Labs  04/01/14 1711 04/01/14 2345 04/02/14 0456 04/02/14 1025  TROPONINI 1.05* 0.80* 0.79* 0.41*    Lab Results  Component Value Date   DDIMER 0.85* 04/01/2014    Diagnostic Studies/Procedures   Dg Chest 2 View  04/01/2014   CLINICAL DATA:  Chest pain, left arm pain.  EXAM: CHEST  2 VIEW  COMPARISON:  Chest radiograph 11/30/2012  FINDINGS: Stable cardiac and mediastinal contours. No consolidative pulmonary opacities. No pleural effusion or pneumothorax. Mid thoracic spine degenerative change.  IMPRESSION: No acute cardiopulmonary process.   Electronically Signed   By: Lovey Newcomer M.D.   On: 04/01/2014 19:11    2D ECHO 04/02/14 Study Conclusions - Left ventricle: There apperas to be hypokinesis in the basal inferior and inferoseptal walls. Overall LVEF is preserved at 60-65%. The cavity size was normal. There was severe focal basal and moderate concentric hypertrophy of the left ventricle. Systolic function was normal. The estimated ejection fraction was in the range of 60% to 65%. Doppler parameters are consistent with abnormal left ventricular relaxation (grade 1 diastolic dysfunction). Doppler parameters are consistent with elevated ventricular end-diastolic filling pressure. - Aortic valve: Trileaflet; normal thickness leaflets. There was no regurgitation. - Aortic root: The aortic root was normal in size. - Ascending aorta: The ascending aorta was normal in size. - Right ventricle: Systolic function was normal. -  Right atrium: The atrium was normal in size. - Tricuspid valve: There was mild regurgitation. - Pulmonary arteries: Systolic pressure was within the normal range. - Inferior vena cava: The vessel was normal in size. - Pericardium, extracardiac: There was no pericardial effusion.    LHC 04/03/14 IMPRESSIONS:  6. Normal left main coronary artery. 7. Mild disease in the left anterior descending artery and its branches. Patent stent in the proximal to mid LAD. 8. Severe disease in the left circumflex artery system with a 95% lesion in the OM1 branch. This was successfully treated with a 2.25 x 24 Promus drug-eluting stent, postdilated to greater than 2.5 mm in diameter. 9. 99% distal right coronary artery stenosis. This was successfully treated with a 2.25 x 24 Promus drug-eluting stent, postdilated to greater than 2.5 mm in diameter. 10. Left ventricular systolic function was not assessed. LVEDP 8 mmHg.  RECOMMENDATION: Continue dual antiplatelet therapy for at least a year and likely longer given that she has stents  in all 3 major vessels. She needs aggressive risk factor modification. Plan for discharge tomorrow.      Discharge Medications     Medication List    STOP taking these medications        ibuprofen 200 MG tablet  Commonly known as:  ADVIL,MOTRIN      TAKE these medications        amitriptyline 10 MG tablet  Commonly known as:  ELAVIL  Take 10 mg by mouth at bedtime.     aspirin EC 81 MG tablet  Take 81 mg by mouth daily.     atorvastatin 80 MG tablet  Commonly known as:  LIPITOR  Take 1 tablet (80 mg total) by mouth daily at 6 PM.     carvedilol 3.125 MG tablet  Commonly known as:  COREG  Take 3.125 mg by mouth 2 (two) times daily with a meal.     COQ10 PO  Take 1 capsule by mouth daily.     diclofenac sodium 1 % Gel  Commonly known as:  VOLTAREN  Apply 2 g topically daily as needed (pain).     furosemide 20 MG tablet  Commonly known as:   LASIX  Take 20 mg by mouth daily.     gabapentin 600 MG tablet  Commonly known as:  NEURONTIN  Take 600 mg by mouth 3 (three) times daily.     glimepiride 4 MG tablet  Commonly known as:  AMARYL  Take 4 mg by mouth daily with breakfast.     levothyroxine 112 MCG tablet  Commonly known as:  SYNTHROID, LEVOTHROID  Take 112 mcg by mouth daily before breakfast.     losartan 100 MG tablet  Commonly known as:  COZAAR  Take 100 mg by mouth daily.     metFORMIN 500 MG tablet  Commonly known as:  GLUCOPHAGE  Take 1 tablet (500 mg total) by mouth 2 (two) times daily with a meal.  Start taking on:  04/06/2014     nitroGLYCERIN 0.4 MG SL tablet  Commonly known as:  NITROSTAT  Place 0.4 mg under the tongue every 5 (five) minutes as needed for chest pain.     polyethylene glycol packet  Commonly known as:  MIRALAX / GLYCOLAX  Take 17 g by mouth daily as needed (constipation).     spironolactone 25 MG tablet  Commonly known as:  ALDACTONE  Take 12.5 mg by mouth daily.     ticagrelor 90 MG Tabs tablet  Commonly known as:  BRILINTA  Take 1 tablet (90 mg total) by mouth 2 (two) times daily.        Disposition   The patient will be discharged in stable condition to home. Discharge Instructions    Amb Referral to Cardiac Rehabilitation    Complete by:  As directed   Pt agrees to Outpt. CRP in [facility], will send referral.          Follow-up Information    Follow up with Melina Copa, PA-C On 04/11/2014.   Specialty:  Cardiology   Why:  @ 9:30am    Contact information:   [address] [place] [address] [phone]       Follow up with Caryn Section Christian Mate, MD.   Specialty:  Family Medicine   Why:   Or your endocrinologist about your thyroid   Contact information:   [place] [address] [phone]  Duration of Discharge Encounter: Greater than 30 minutes including physician and PA time.  SignedAngelena Form  R PA-C 04/04/2014, 9:38 AM   I have examined the patient and reviewed assessment and plan and discussed with patient. Agree with above as stated. Stressed importance of DAPT. She is agreeable to stay on DAPT for a year without interruption. Will arrange f/u with Dr. Radford Pax. Aspirin and Brilinta for a year. THen could switch to Plavix 75 mg daily after that or consider lower dose Brilinta.  Coltan Spinello S.

## 2014-04-04 NOTE — Telephone Encounter (Signed)
New message     TCM appt made on 04-11-14 at 9:30 with Dayna.

## 2014-04-04 NOTE — Progress Notes (Signed)
TR BAND REMOVAL  LOCATION:    right radial  DEFLATED PER PROTOCOL:    Yes.    TIME BAND OFF / DRESSING APPLIED:    21:15   SITE UPON ARRIVAL:    Level 0  SITE AFTER BAND REMOVAL:    Level 0  REVERSE ALLEN'S TEST:     positive  CIRCULATION SENSATION AND MOVEMENT:    Within Normal Limits   Yes.    COMMENTS:

## 2014-04-04 NOTE — Progress Notes (Signed)
Patient Name: Alyssa [name] Date of Encounter: 04/04/2014     Active Problems:   DM type 2 (diabetes mellitus, type 2)   Coronary atherosclerosis of native coronary artery   Hyperlipemia   NSTEMI (non-ST elevated myocardial infarction)   Essential hypertension    SUBJECTIVE  Feeling well. NO CP or SOB . Ready to go home  CURRENT MEDS . amitriptyline  10 mg Oral QHS  . aspirin EC  81 mg Oral Daily  . atorvastatin  80 mg Oral q1800  . carvedilol  3.125 mg Oral BID WC  . cefTRIAXone (ROCEPHIN)  IV  1 g Intravenous Q24H  . furosemide  20 mg Oral Daily  . gabapentin  600 mg Oral BID  . glimepiride  4 mg Oral Q breakfast  . insulin aspart  0-15 Units Subcutaneous TID WC  . insulin aspart  0-5 Units Subcutaneous QHS  . levothyroxine  112 mcg Oral QAC breakfast  . losartan  100 mg Oral Daily  . spironolactone  25 mg Oral Daily  . ticagrelor  90 mg Oral BID    OBJECTIVE  Filed Vitals:   04/03/14 2000 04/03/14 2334 04/04/14 0016 04/04/14 0356  BP: 108/76 128/48  145/47  Pulse: 85 85  83  Temp: 97.6 F (36.4 C) 98 F (36.7 C)  97.5 F (36.4 C)  TempSrc: Oral Oral  Oral  Resp: 18 18  20   Height:      Weight:   164 lb 14.5 oz (74.8 kg)   SpO2: 99% 97%  96%    Intake/Output Summary (Last 24 hours) at 04/04/14 0634 Last data filed at 04/03/14 2000  Gross per 24 hour  Intake    595 ml  Output   1500 ml  Net   -905 ml   Filed Weights   04/01/14 2305 04/03/14 0344 04/04/14 0016  Weight: 164 lb 12.8 oz (74.753 kg) 163 lb 14.4 oz (74.345 kg) 164 lb 14.5 oz (74.8 kg)    PHYSICAL EXAM  General: Pleasant, NAD. Neuro: Alert and oriented X 3. Moves all extremities spontaneously. Psych: Normal affect. HEENT:  Normal  Neck: Supple without bruits or JVD. Lungs:  Resp regular and unlabored, decreased breath sounds Heart: RRR no s3, s4, or murmurs. Abdomen: Soft, non-tender, non-distended, BS + x 4.  Extremities: No clubbing, cyanosis or edema. DP/PT/Radials 2+ and  equal bilaterally.  Accessory Clinical Findings  CBC  Recent Labs  04/01/14 2345  04/03/14 0536 04/04/14 0410  WBC 6.0  < > 6.8 6.4  NEUTROABS 4.1  --   --   --   HGB 11.3*  < > 11.1* 11.2*  HCT 34.9*  < > 35.3* 34.6*  MCV 83.1  < > 83.8 82.8  PLT 245  < > 244 243  < > = values in this interval not displayed. Basic Metabolic Panel  Recent Labs  04/02/14 0300 04/04/14 0410  NA 138 139  K 4.1 3.5  CL 101 107  CO2 24 23  GLUCOSE 290* 179*  BUN 15 13  CREATININE 0.75 0.77  CALCIUM 8.8 8.8  MG 2.0  --    Liver Function Tests  Recent Labs  04/02/14 0300  AST 15  ALT 15  ALKPHOS 85  BILITOT 0.2*  PROT 6.3  ALBUMIN 3.5    Cardiac Enzymes  Recent Labs  04/01/14 2345 04/02/14 0456 04/02/14 1025  TROPONINI 0.80* 0.79* 0.41*   BNP Invalid input(s): POCBNP D-Dimer  Recent Labs  04/01/14 2140  DDIMER  0.85*   Hemoglobin A1C  Recent Labs  04/01/14 2345  HGBA1C 9.1*     Thyroid Function Tests  Recent Labs  04/01/14 2345  TSH 7.250*    TELE  NSR with some PVCs  Radiology/Studies  Dg Chest 2 View  04/01/2014   CLINICAL DATA:  Chest pain, left arm pain.  EXAM: CHEST  2 VIEW  COMPARISON:  Chest radiograph 11/30/2012  FINDINGS: Stable cardiac and mediastinal contours. No consolidative pulmonary opacities. No pleural effusion or pneumothorax. Mid thoracic spine degenerative change.  IMPRESSION: No acute cardiopulmonary process.   Electronically Signed   By: Lovey Newcomer M.D.   On: 04/01/2014 19:11    2D echo 04/02/14 Study Conclusions - Left ventricle: There apperas to be hypokinesis in the basal inferior and inferoseptal walls. Overall LVEF is preserved at 60-65%. The cavity size was normal. There was severe focal basal and moderate concentric hypertrophy of the left ventricle. Systolic function was normal. The estimated ejection fraction was in the range of 60% to 65%. Doppler parameters are consistent with abnormal left  ventricular relaxation (grade 1 diastolic dysfunction). Doppler parameters are consistent with elevated ventricular end-diastolic filling pressure. - Aortic valve: Trileaflet; normal thickness leaflets. There was no regurgitation. - Aortic root: The aortic root was normal in size. - Ascending aorta: The ascending aorta was normal in size. - Right ventricle: Systolic function was normal. - Right atrium: The atrium was normal in size. - Tricuspid valve: There was mild regurgitation. - Pulmonary arteries: Systolic pressure was within the normal range. - Inferior vena cava: The vessel was normal in size. - Pericardium, extracardiac: There was no pericardial effusion.    ASSESSMENT AND PLAN Alyssa [name] is a 78 y.o. female with a history of CAD with DES to LAD (2013), HTN, chronic diastolic CHF, GERD and PVD who presented to the [facility] ED on 04/01/14  with complaints of chest pain and found to have NSTEMI  NSTEMI- peak troponin 1.05 -- 2D ECHO 04/02/14 with hypokinesis in the basal inferior and inferoseptal walls. Focal and moderate concentric LVH. Overall LVEF 60-65%. G1DD. Elevated EDLV filling pressures. Mild TR. 1. Normal left main coronary artery. 2. Mild disease in the left anterior descending artery and its branches. Patent stent in the proximal to mid LAD. 3. Severe disease in the left circumflex artery system with a 95% lesion in the OM1 branch. This was successfully treated with a 2.25 x 24 Promus drug-eluting stent, postdilated to greater than 2.5 mm in diameter. 4. 99% distal right coronary artery stenosis. This was successfully treated with a 2.25 x 24 Promus drug-eluting stent, postdilated to greater than 2.5 mm in diameter. 5. Left ventricular systolic function was not assessed. LVEDP 8 mmHg. -- Continue DAPT with ASA/Brilinta for at least a year but ideally longer as she has stents in all her major vessels. Continue high potency statin, BB and ARB.  HTN- well  controlled -- Continue losartan 100mg , spironolactone 25mg , lasix 20mg , carvedilol 3.125mg  BID  HLD- she had been on a statin but stopped because she "read it could cause bad things".  -- Started back on atorvastatin 80mg . She is okay with this   DM - continue to hold metformin >48 hours post cardiac cath. Okay to resumed on 04/06/14 -- Continue home meds  Chronic diastolic CHF - appears euvolemic -- Continue home lasix 20mg  po qd  LLE edema for several weeks with normal venous dopplers 2 weeks ago. LLE is mildly erythematous ? Early cellulitis. WBC is normal. --  Given Rocephin 1gm IV for possible early LLE cellulitis. Looks stable.   Hypothyroidism- elevated TSH (7.2)' -- Continue synthroid -- Follow up with PCP as she made need thryoid dose adjustment  Signed, Eileen Stanford PA-C  Pager [phone]  I have examined the patient and reviewed assessment and plan and discussed with patient.  Agree with above as stated.  Stressed importance of DAPT.  She is agreeable to stay on DAPT for a year without interruption.  Will arrange f/u with Dr. Radford Pax.  Brilinta for a year.  THen could switch to Plavix 75 mg daily after that or consider lower dose Brilinta.  Alyssa Godeaux S.

## 2014-04-05 NOTE — Telephone Encounter (Signed)
Patient still asleeep, per husband. Will call back later.

## 2014-04-05 NOTE — Telephone Encounter (Signed)
Patient contacted regarding discharge from [facility] yesterday.Patient understands to follow up with provider dayna Dunn on 04/11/2014 at [place] understands discharge instructions? yes Patient understands medications and regiment? yes Patient understands to bring all medications to this visit? Yes Radial cath site clean and dry. No redness or swelling.

## 2014-04-08 LAB — CULTURE, BLOOD (ROUTINE X 2)
Culture: NO GROWTH
Culture: NO GROWTH

## 2014-04-10 ENCOUNTER — Telehealth: Payer: Self-pay | Admitting: Physician Assistant

## 2014-04-10 NOTE — Telephone Encounter (Signed)
Called by pt regarding left shoulder pain, woke with it today. Not like her pre-PCI symptoms, severe at times.   Tried SL NTG x 2, no relief. The pain is sharp, worse with movements. Has not tried any other medications, the gabapentin she takes chronically may have helped a little.   Advised her the pain is not likely her heart, but if she feels she needs pain control medications or evaluation, she needs to come to the ER. Advised pt that Tylenol, gabapentin and her Voltaren gel are OK to use.   Pt with appt in am, says she will keep it.   Rosaria Ferries, PA-C 04/10/2014 7:21 PM [place] [phone]

## 2014-04-11 ENCOUNTER — Ambulatory Visit (INDEPENDENT_AMBULATORY_CARE_PROVIDER_SITE_OTHER): Payer: Managed Care, Other (non HMO) | Admitting: Physician Assistant

## 2014-04-11 ENCOUNTER — Encounter: Payer: Self-pay | Admitting: Physician Assistant

## 2014-04-11 VITALS — BP 98/62 | HR 63 | Ht 62.0 in | Wt 164.2 lb

## 2014-04-11 DIAGNOSIS — M25512 Pain in left shoulder: Secondary | ICD-10-CM

## 2014-04-11 DIAGNOSIS — I2510 Atherosclerotic heart disease of native coronary artery without angina pectoris: Secondary | ICD-10-CM

## 2014-04-11 DIAGNOSIS — E039 Hypothyroidism, unspecified: Secondary | ICD-10-CM

## 2014-04-11 DIAGNOSIS — I110 Hypertensive heart disease with heart failure: Secondary | ICD-10-CM

## 2014-04-11 DIAGNOSIS — E785 Hyperlipidemia, unspecified: Secondary | ICD-10-CM | POA: Insufficient documentation

## 2014-04-11 DIAGNOSIS — D649 Anemia, unspecified: Secondary | ICD-10-CM | POA: Insufficient documentation

## 2014-04-11 DIAGNOSIS — I119 Hypertensive heart disease without heart failure: Secondary | ICD-10-CM | POA: Insufficient documentation

## 2014-04-11 DIAGNOSIS — I10 Essential (primary) hypertension: Secondary | ICD-10-CM

## 2014-04-11 DIAGNOSIS — I5032 Chronic diastolic (congestive) heart failure: Secondary | ICD-10-CM

## 2014-04-11 DIAGNOSIS — I214 Non-ST elevation (NSTEMI) myocardial infarction: Secondary | ICD-10-CM

## 2014-04-11 DIAGNOSIS — Z8719 Personal history of other diseases of the digestive system: Secondary | ICD-10-CM | POA: Insufficient documentation

## 2014-04-11 MED ORDER — LOSARTAN POTASSIUM 50 MG PO TABS: 50.0000 mg | ORAL_TABLET | Freq: Every day | ORAL | Status: DC

## 2014-04-11 NOTE — Patient Instructions (Signed)
Your physician has recommended you make the following change in your medication:     START TAKING  LOSARTAN 50 MG ONCE A DAY    LABS IN 6 TO 8 WEEKS  LFT AND LIPIDS  FASTING  AND FOLLOW UP   DR TURNER IN 6 TO 8 WEEKS      FOLLOW UP WITH PCP THIS WEEK  ABOUT SHOULDER PAIN ANEMIA AND THYROID    MONITOR BLOOD PRESSURE AT HOME AND  CONTACT OFFICE IF BLOOD PRESSURE IS 140 OR 100 ON THE TOP NUMBER    IF YOU DEVELOP SYMPTOMS AS PRIOR HEART ATTACK GO TO EMERGENCY DEPARTMENT

## 2014-04-11 NOTE — Progress Notes (Signed)
[place] [place], [place]  [address] Phone: [phone] Fax:  [phone]  Date:  04/11/2014   Patient ID:  Alyssa, [name] [birth year], MRN [id number]   PCP:  Azzie Almas, MD  Cardiologist:  Dr. Radford Pax (previously seen by Dr. Haroldine Laws but lost to follow up)   History of Present Illness: Alyssa [name] is a 78 y.o. female with history of CAD (DES to LAD 2013, recent NSTEMI s/p DES to OM1 and DES to RCA 03/2014), HTN, chronic diastolic CHF, GERD, hypothyroidism, DM and PVD (details unclear without available studies in Epic) who presents for post-hospital follow-up.  She was admitted 12/19-/12/22 after presenting with left arm/jaw/shoulder pain unrelieved by ibuprofen and amitryptiline. She also reported LEE L>R and reportedly had a doppler of LLE which was normal. This was felt possibly r/t early cellulitis and she was treated with IV rocephin. During admission she ruled in for NSTEMI with peak troponin of 1.05, LHC showing patent LAD stent, severe OM1 disease s/p DES and severe RCA stenosis s/p DES, LVEDP 8. 2D Echo 04/02/14 with hypokinesis in the basal inferior and inferoseptal walls, focal and moderate concentric LVH, overall LVEF 60-65%, G1DD. Elevated EDLV filling pressures, Mild TR. Recommendation was to continue DAPT with ASA/Brilinta for at least a year but ideally longer as she has stents in all her major vessels. D-dimer was mildly elevated but she did not have any clinical signs of PE. She previously was resitant to the idea of statins due to poor media coverage recently but after further discussion was amenable thus was placed on Atorvastatin 80mg . Hgb stable in 11 range during adimssion (baseline appears 12). Baseline LFTs OK 12/20. No recent lipids available. TSH was 7.250 and she was instructed to f/u PCP or endocrinologist for further management. She has not yet done so.  She comes in today for follow-up. She denies any chest pain, dyspnea, syncope or  recurrent jaw pain. She says she fell 2 days after discharge while rushing to answer the phone. This was a mechanical fall. No head injury or syncope. She skinned her knee and thinks she may have fallen on her left side. She awoke yesterday AM with left shoulder discomfort that was fairly constant. It has been worse with raising of her left arm over her head as well as going to comb her hair with her right arm. She generally feels "sluggish." She denies any acute weakness, visual changes or neuro symptoms. No bleeding, fever, chills. This left arm pain is different than the symptoms that prompted her recent admission.  Recent Labs: 04/01/2014: Pro B Natriuretic peptide (BNP) 853.7*; TSH 7.250* 04/02/2014: ALT 15 04/04/2014: Creatinine 0.77; Hemoglobin 11.2*; Potassium 3.5  Wt Readings from Last 3 Encounters:  04/11/14 164 lb 3.2 oz (74.481 kg)  04/04/14 164 lb 14.5 oz (74.8 kg)  01/24/13 160 lb (72.576 kg)     Past Medical History  Diagnosis Date  . Hypothyroid   . HLD (hyperlipidemia)   . CAD (coronary artery disease)     a. 2013 NSTEMI s/p DES to LAD. b. 04/03/14: NSTEMI s/p DES to OM1, DES to dRCA.  [name] HTN (hypertension)   . Chronic diastolic CHF (congestive heart failure)     a. 2D ECHO 04/02/14 with hypokinesis in the basal inferior and inferoseptal walls. Focal and moderate concentric LVH. Overall LVEF 60-65%. G1DD. Elevated EDLV filling pressures. Mild TR.  [name] GERD (gastroesophageal reflux disease)   . Refusal of blood transfusions as patient  is Jehovah's Witness 09-29-12  . Type II diabetes mellitus   . Peripheral vascular disease   . History of GI bleed     a. 2013: adm for ABL anemia. 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  . Anemia   . Hypertensive heart disease     Current Outpatient Prescriptions  Medication Sig Dispense Refill  . amitriptyline (ELAVIL) 10 MG tablet Take 10  mg by mouth at bedtime.    [name] aspirin 81 MG tablet Take 81 mg by mouth daily.    [name] atorvastatin (LIPITOR) 80 MG tablet Take 1 tablet (80 mg total) by mouth daily at 6 PM. 30 tablet 11  . carvedilol (COREG) 3.125 MG tablet Take 3.125 mg by mouth 2 (two) times daily with a meal.     . Coenzyme Q10 (COQ10 PO) Take 1 capsule by mouth daily.    . diclofenac sodium (VOLTAREN) 1 % GEL Apply 2 g topically daily as needed (pain).    . furosemide (LASIX) 20 MG tablet Take 20 mg by mouth daily.     [name] gabapentin (NEURONTIN) 600 MG tablet Take 600 mg by mouth 3 (three) times daily.   5  . glimepiride (AMARYL) 4 MG tablet Take 4 mg by mouth daily with breakfast.     . levothyroxine (SYNTHROID, LEVOTHROID) 112 MCG tablet Take 112 mcg by mouth daily before breakfast.    . losartan (COZAAR) 100 MG tablet Take 100 mg by mouth daily.    . metFORMIN (GLUCOPHAGE) 500 MG tablet Take 1 tablet (500 mg total) by mouth 2 (two) times daily with a meal. 60 tablet 11  . nitroGLYCERIN (NITROSTAT) 0.4 MG SL tablet Place 0.4 mg under the tongue every 5 (five) minutes as needed for chest pain.     . polyethylene glycol (MIRALAX / GLYCOLAX) packet Take 17 g by mouth daily as needed (constipation).     [name] spironolactone (ALDACTONE) 25 MG tablet Take 12.5 mg by mouth daily.     . ticagrelor (BRILINTA) 90 MG TABS tablet Take 1 tablet (90 mg total) by mouth 2 (two) times daily. 60 tablet 11   No current facility-administered medications for this visit.    Allergies:   Tetanus toxoids   Social History:  The patient  reports that she quit smoking about 44 years ago. Her smoking use included Cigarettes. She has a 24 pack-year smoking history. She has never used smokeless tobacco. She reports that she drinks alcohol. She reports that she does not use illicit drugs.   Family History:  The patient's family history includes Hypertension in an other family member.  ROS:  Please see the history of present illness.     All other systems  reviewed and negative.   PHYSICAL EXAM: VS:  BP 98/62 mmHg  Pulse 63  Ht 5\' 2"  (1.575 m)  Wt 164 lb 3.2 oz (74.481 kg)  BMI 30.03 kg/m2 POx 98% in clinic on RA. Recheck BP 102/64 Well nourished, well developed WF in no acute distress HEENT: normal Neck: no JVD Cardiac:  normal S1, S2; RRR; no murmur Lungs:  clear to auscultation bilaterally, no wheezing, rhonchi or rales Abd: soft, nontender, no hepatomegaly Ext: no edema, small abrasion anterior left knee with scabbing and mild circumferential erythema/healing granulomatous appearance, right radial cath site without significant ecchymosis or hematoma. Pt notes pain in her left shoulder when reaching to put her hands behind her head. She  also notes intermittent pain with raising her left arm in abduction. She feels pain in her left shoulder when reaching up to touch her hair with her right arm. No obvious trauma or deformity to left shoulder. Not tender to palpation and no ecchymosis. Skin: warm and dry Neuro:  moves all extremities spontaneously, no focal abnormalities noted  EKG:  NSR 63bpm, nonspecific TW changes III/avF and V6, QTc 435ms, low voltage - reviewed with MD  ASSESSMENT AND PLAN:  1. Left shoulder pain - sounds musculoskeletal in etiology, may be related to fall. Symptoms are different than what prompted recent hospital admission. They are worse with specific movements of the joint. Left shoulder is atraumatic. She is not tachycardic, tachypnic or hypoxic and she denies chest pain, dyspnea or recurrent jaw pain. Discussed clinical situation and EKG with Dr. Radford Pax. We have asked her to f/u PCP this week to discuss further orthopedic workup. If symptoms change/worsen she is aware to go to ER. 2. CAD with recent NSTEMI/PCI as above - continue aspirin, Brilinta, BB, statin.  3. Essential HTN - BP running softer. Recheck BP 102/64. Since she reports feeling sluggish, will decrease losartan to 50mg  daily and have her monitor BP at  home and call if running >140 or <100.  4. Chronic diastolic CHF with hypertensive heart disease - appears euvolemic. 5. Hyperlipidemia - f/u lipids/LFTs in 6-8 weeks. 6. Anemia, unspecified (remote h/o GIB 2/2 AVM s/p APC) - no evidence of bleeding. I advised she f/u PCP for further monitoring. 7. Hypothyroidism - some of her sluggishness may be related to this, +/- anemia. I have reinforced need to f/u PCP.  Dispo: F/u 6 weeks with Dr. Radford Pax.  Signed, Melina Copa, PA-C  04/11/2014 9:54 AM

## 2014-04-15 ENCOUNTER — Other Ambulatory Visit (HOSPITAL_COMMUNITY): Payer: Self-pay | Admitting: Internal Medicine

## 2014-04-15 DIAGNOSIS — I5022 Chronic systolic (congestive) heart failure: Secondary | ICD-10-CM

## 2014-04-21 ENCOUNTER — Telehealth (HOSPITAL_COMMUNITY): Payer: Self-pay | Admitting: Vascular Surgery

## 2014-04-21 NOTE — Telephone Encounter (Signed)
Eric from [facility] called about cardiac rehab referral.. Please advise

## 2014-04-26 ENCOUNTER — Telehealth: Payer: Self-pay | Admitting: Cardiology

## 2014-04-26 NOTE — Telephone Encounter (Signed)
New Msg        Eric from [facility] calling.Marland KitchenMarland KitchenNeeds referral for this pt printed and faxed because he can't view in Epic.  Per Melina Copa notes Dr. Radford Pax is the phys for pt. Please print and fax referral to Randall Hiss, fax # [phone]. He also states electronic referral is fine.   Randall Hiss may also be reached in his office at [phone].  Thank you

## 2014-04-26 NOTE — Telephone Encounter (Signed)
Per Dr. Radford Pax, printed and faxed order placed in EPIC to [facility] at [phone].

## 2014-05-02 ENCOUNTER — Telehealth: Payer: Self-pay | Admitting: Cardiology

## 2014-05-02 NOTE — Telephone Encounter (Signed)
Spoke with [facility] Hiss. Re-faxing order now.

## 2014-05-02 NOTE — Telephone Encounter (Signed)
New message       Please fax order for cardiac rehab to [phone] or [phone].  He said he can see it in epic but cannot retreive it.

## 2014-05-03 DIAGNOSIS — D649 Anemia, unspecified: Secondary | ICD-10-CM

## 2014-05-03 DIAGNOSIS — E785 Hyperlipidemia, unspecified: Secondary | ICD-10-CM

## 2014-05-03 DIAGNOSIS — I5032 Chronic diastolic (congestive) heart failure: Secondary | ICD-10-CM

## 2014-05-03 DIAGNOSIS — M25512 Pain in left shoulder: Secondary | ICD-10-CM

## 2014-05-03 DIAGNOSIS — I222 Subsequent non-ST elevation (NSTEMI) myocardial infarction: Secondary | ICD-10-CM

## 2014-05-03 DIAGNOSIS — I110 Hypertensive heart disease with heart failure: Secondary | ICD-10-CM

## 2014-05-03 DIAGNOSIS — E039 Hypothyroidism, unspecified: Secondary | ICD-10-CM

## 2014-05-03 DIAGNOSIS — I509 Heart failure, unspecified: Secondary | ICD-10-CM

## 2014-05-03 DIAGNOSIS — I10 Essential (primary) hypertension: Secondary | ICD-10-CM

## 2014-05-03 DIAGNOSIS — I214 Non-ST elevation (NSTEMI) myocardial infarction: Secondary | ICD-10-CM

## 2014-05-10 ENCOUNTER — Other Ambulatory Visit: Payer: Self-pay | Admitting: Cardiovascular Disease

## 2014-05-16 ENCOUNTER — Other Ambulatory Visit: Payer: Self-pay

## 2014-05-16 NOTE — Telephone Encounter (Signed)
Patient called for samples of brilintia samples placed samples up front

## 2014-05-19 ENCOUNTER — Other Ambulatory Visit: Payer: Self-pay | Admitting: Physician Assistant

## 2014-05-19 DIAGNOSIS — I2510 Atherosclerotic heart disease of native coronary artery without angina pectoris: Secondary | ICD-10-CM

## 2014-05-19 MED ORDER — TICAGRELOR 90 MG PO TABS: 90.0000 mg | ORAL_TABLET | Freq: Two times a day (BID) | ORAL | Status: DC

## 2014-05-21 NOTE — Progress Notes (Signed)
Cardiology Office Note   Date:  05/22/2014   ID:  Alyssa [name] Jul 21, 1935, MRN [id number]  PCP:  Azzie Almas, MD  Cardiologist:   Sueanne Margarita, MD   Chief Complaint  Patient presents with  . Coronary Artery Disease  . Hyperlipidemia  . Hypertension      History of Present Illness: Alyssa [name] is a 79 y.o. female with history of CAD (DES to LAD 2013, recent NSTEMI s/p DES to OM1 and DES to RCA 03/2014), HTN, chronic diastolic CHF, GERD, hypothyroidism, DM and PVD who presents for post-hospital follow-up.  She was admitted 12/19-/12/22 after presenting with left arm/jaw/shoulder pain unrelieved by ibuprofen and amitryptiline. She also reported LEE L>R and reportedly had a doppler of LLE which was normal. This was felt possibly r/t early cellulitis and she was treated with IV rocephin. During admission she ruled in for NSTEMI with peak troponin of 1.05, LHC showing patent LAD stent, severe OM1 disease s/p DES and severe RCA stenosis s/p DES, LVEDP 8. 2D Echo 04/02/14 with hypokinesis in the basal inferior and inferoseptal walls, focal and moderate concentric LVH, overall LVEF 60-65%, G1DD. Elevated EDLV filling pressures, Mild TR. Recommendation was to continue DAPT with ASA/Brilinta for at least a year but ideally longer as she has stents in all her major vessels. D-dimer was mildly elevated but she did not have any clinical signs of PE. She previously was resitant to the idea of statins due to poor media coverage recently but after further discussion was amenable thus was placed on Atorvastatin 80mg . Hgb stable in 11 range during adimssion (baseline appears 12). Baseline LFTs OK 12/20. No recent lipids available. TSH was 7.250 and she was instructed to f/u PCP or endocrinologist for further management. She has not yet done so.  She comes in today for follow-up. She denies any chest pain or pressure.  Occasionally she will have some DOE.  She occasionally has some LE edema that  is sporadic.  She denies any palptiations, dizziness or syncope.  Past Medical History  Diagnosis Date  . Hypothyroid   . HLD (hyperlipidemia)   . CAD (coronary artery disease)     a. 2013 NSTEMI s/p DES to LAD. b. 04/03/14: NSTEMI s/p DES to OM1, DES to dRCA.  [name] HTN (hypertension)   . Chronic diastolic CHF (congestive heart failure)     a. 2D ECHO 04/02/14 with hypokinesis in the basal inferior and inferoseptal walls. Focal and moderate concentric LVH. Overall LVEF 60-65%. G1DD. Elevated EDLV filling pressures. Mild TR.  [name] GERD (gastroesophageal reflux disease)   . Refusal of blood transfusions as patient is Jehovah's Witness 09-29-12  . Type II diabetes mellitus   . Peripheral vascular disease   . History of GI bleed     a. 2013: adm for ABL anemia. 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  . Anemia   . Hypertensive heart disease     Past Surgical History  Procedure Laterality Date  . Tonsillectomy    . Esophagogastroduodenoscopy  02/26/2012    Procedure: ESOPHAGOGASTRODUODENOSCOPY (EGD);  Surgeon: Lear Ng, MD;  Location: [facility] ENDOSCOPY;  Service: Endoscopy;  Laterality: N/A;  . Colonoscopy  02/27/2012    Procedure: COLONOSCOPY;  Surgeon: Lear Ng, MD;  Location: [facility] ENDOSCOPY;  Service: Endoscopy;  Laterality: N/A;  . Cesarean section  ~ 1961; 1966    plus 3 NVD  . Left  heart catheterization with coronary angiogram N/A 06/12/2011    Procedure: LEFT HEART CATHETERIZATION WITH CORONARY ANGIOGRAM;  Surgeon: Josue Hector, MD;  Location: [facility] CATH LAB;  Service: Cardiovascular;  Laterality: N/A;  . Coronary angioplasty with stent placement  05/2011; 04/03/2014  . Vaginal hysterectomy    . Left heart catheterization with coronary angiogram N/A 04/03/2014    Procedure: LEFT HEART CATHETERIZATION WITH CORONARY ANGIOGRAM;  Surgeon: Jettie Booze, MD;  Location: [facility]  CATH LAB;  Service: Cardiovascular;  Laterality: N/A;     Current Outpatient Prescriptions  Medication Sig Dispense Refill  . amitriptyline (ELAVIL) 10 MG tablet Take 10 mg by mouth at bedtime as needed for sleep.     [name] aspirin 81 MG tablet Take 81 mg by mouth daily.    [name] atorvastatin (LIPITOR) 80 MG tablet Take 1 tablet (80 mg total) by mouth daily at 6 PM. 30 tablet 11  . carvedilol (COREG) 3.125 MG tablet Take 3.125 mg by mouth 2 (two) times daily with a meal.     . Coenzyme Q10 (COQ10 PO) Take 1 capsule by mouth daily.    . diclofenac sodium (VOLTAREN) 1 % GEL Apply 2 g topically daily as needed (pain).    . furosemide (LASIX) 20 MG tablet Take 20 mg by mouth daily.     [name] gabapentin (NEURONTIN) 600 MG tablet Take 600 mg by mouth 3 (three) times daily.   5  . glimepiride (AMARYL) 4 MG tablet Take 4 mg by mouth daily with breakfast.     . levothyroxine (SYNTHROID, LEVOTHROID) 112 MCG tablet Take 112 mcg by mouth daily before breakfast.    . losartan (COZAAR) 50 MG tablet Take 1 tablet (50 mg total) by mouth daily. 90 tablet 3  . metFORMIN (GLUCOPHAGE) 500 MG tablet Take 1 tablet (500 mg total) by mouth 2 (two) times daily with a meal. 60 tablet 11  . nitroGLYCERIN (NITROSTAT) 0.4 MG SL tablet Place 0.4 mg under the tongue every 5 (five) minutes as needed for chest pain.     . polyethylene glycol (MIRALAX / GLYCOLAX) packet Take 17 g by mouth daily as needed (constipation).     [name] spironolactone (ALDACTONE) 25 MG tablet Take 12.5 mg by mouth daily.     . ticagrelor (BRILINTA) 90 MG TABS tablet Take 1 tablet (90 mg total) by mouth 2 (two) times daily. 60 tablet 11   No current facility-administered medications for this visit.    Allergies:   Tetanus toxoids    Social History:  The patient  reports that she quit smoking about 44 years ago. Her smoking use included Cigarettes. She has a 24 pack-year smoking history. She has never used smokeless tobacco. She reports that she drinks alcohol.  She reports that she does not use illicit drugs.   Family History:  The patient's family history includes Arthritis in her father; Hypertension in an other family member.    ROS:  Please see the history of present illness.   Otherwise, review of systems are positive for none.   All other systems are reviewed and negative.    PHYSICAL EXAM: VS:  BP 100/54 mmHg  Pulse 79  Ht 5\' 2"  (1.575 m)  Wt 161 lb 3.2 oz (73.12 kg)  BMI 29.48 kg/m2  SpO2 99% , BMI Body mass index is 29.48 kg/(m^2). GEN: Well nourished, well developed, in no acute distress HEENT: normal Neck: no JVD, carotid bruits, or masses Cardiac: RRR; no murmurs, rubs, or gallops,no edema  Respiratory:  clear to auscultation bilaterally, normal work of breathing GI: soft, nontender, nondistended, + BS MS: no deformity or atrophy Skin: warm and dry, no rash Neuro:  Strength and sensation are intact Psych: euthymic mood, full affect   EKG:  EKG is not ordered today.    Recent Labs: 04/01/2014: Pro B Natriuretic peptide (BNP) 853.7*; TSH 7.250* 04/02/2014: ALT 15; Magnesium 2.0 04/04/2014: BUN 13; Creatinine 0.77; Hemoglobin 11.2*; Platelets 243; Potassium 3.5; Sodium 139    Lipid Panel No results found for: CHOL, TRIG, HDL, CHOLHDL, VLDL, LDLCALC, LDLDIRECT    Wt Readings from Last 3 Encounters:  05/22/14 161 lb 3.2 oz (73.12 kg)  04/11/14 164 lb 3.2 oz (74.481 kg)  04/04/14 164 lb 14.5 oz (74.8 kg)      ASSESSMENT AND PLAN:  1. CAD with recent NSTEMI/PCI as above - continue aspirin, Brilinta, BB, statin.  2. Essential HTN - stable on BB and ARB 3. Chronic diastolic CHF with hypertensive heart disease - appears euvolemic.  Continue Lasix.  Check BMET 4. Hyperlipidemia - check lipids/LFTs - she is not fasting today so will need to schedule 5. Anemia, unspecified (remote h/o GIB 2/2 AVM s/p APC) - no evidence of bleeding. F/u PCP for further monitoring.  Current medicines are reviewed at length with the  patient today.  The patient does not have concerns regarding medicines.  The following changes have been made:  no change  Labs/ tests ordered today include: FLP and ALT, BMET    Disposition:   FU with me in 6 months   Signed, Sueanne Margarita, MD  05/22/2014 9:34 AM    [facility] [address], [place], [place]  [address] Phone: [phone]; Fax: [phone]

## 2014-05-22 ENCOUNTER — Encounter: Payer: Self-pay | Admitting: Cardiology

## 2014-05-22 ENCOUNTER — Other Ambulatory Visit: Payer: PPO | Admitting: *Deleted

## 2014-05-22 ENCOUNTER — Ambulatory Visit (INDEPENDENT_AMBULATORY_CARE_PROVIDER_SITE_OTHER): Payer: PPO | Admitting: Cardiology

## 2014-05-22 VITALS — BP 100/54 | HR 79 | Ht 62.0 in | Wt 161.2 lb

## 2014-05-22 DIAGNOSIS — I5032 Chronic diastolic (congestive) heart failure: Secondary | ICD-10-CM

## 2014-05-22 DIAGNOSIS — I10 Essential (primary) hypertension: Secondary | ICD-10-CM

## 2014-05-22 DIAGNOSIS — I2583 Coronary atherosclerosis due to lipid rich plaque: Principal | ICD-10-CM

## 2014-05-22 DIAGNOSIS — I2510 Atherosclerotic heart disease of native coronary artery without angina pectoris: Secondary | ICD-10-CM

## 2014-05-22 DIAGNOSIS — E785 Hyperlipidemia, unspecified: Secondary | ICD-10-CM

## 2014-05-22 NOTE — Patient Instructions (Addendum)
Your physician recommends that you return for lab work in: 1 Week (BMET, FASTING Lipids, LFT) 05/30/14 Your physician wants you to follow-up in: 6 months with Dr. Radford Pax. You will receive a reminder letter in the mail two months in advance. If you don't receive a letter, please call our office to schedule the follow-up appointment.  Your physician recommends that you continue on your current medications as directed. Please refer to the Current Medication list given to you today.

## 2014-05-30 ENCOUNTER — Other Ambulatory Visit: Payer: PPO

## 2014-06-02 ENCOUNTER — Other Ambulatory Visit (INDEPENDENT_AMBULATORY_CARE_PROVIDER_SITE_OTHER): Payer: PPO | Admitting: *Deleted

## 2014-06-02 DIAGNOSIS — I5032 Chronic diastolic (congestive) heart failure: Secondary | ICD-10-CM

## 2014-06-02 DIAGNOSIS — I2510 Atherosclerotic heart disease of native coronary artery without angina pectoris: Secondary | ICD-10-CM

## 2014-06-02 DIAGNOSIS — I2583 Coronary atherosclerosis due to lipid rich plaque: Secondary | ICD-10-CM

## 2014-06-02 DIAGNOSIS — I10 Essential (primary) hypertension: Secondary | ICD-10-CM

## 2014-06-02 DIAGNOSIS — E785 Hyperlipidemia, unspecified: Secondary | ICD-10-CM

## 2014-06-02 LAB — HEPATIC FUNCTION PANEL
ALT: 20 U/L (ref 0–35)
AST: 18 U/L (ref 0–37)
Albumin: 4 g/dL (ref 3.5–5.2)
Alkaline Phosphatase: 87 U/L (ref 39–117)
Bilirubin, Direct: 0 mg/dL (ref 0.0–0.3)
TOTAL PROTEIN: 6.8 g/dL (ref 6.0–8.3)
Total Bilirubin: 0.4 mg/dL (ref 0.2–1.2)

## 2014-06-02 LAB — BASIC METABOLIC PANEL
BUN: 16 mg/dL (ref 6–23)
CHLORIDE: 102 meq/L (ref 96–112)
CO2: 28 mEq/L (ref 19–32)
Calcium: 9.5 mg/dL (ref 8.4–10.5)
Creatinine, Ser: 0.89 mg/dL (ref 0.40–1.20)
GFR: 65.45 mL/min (ref >60.00)
Glucose, Bld: 246 mg/dL — ABNORMAL HIGH (ref 70–99)
POTASSIUM: 4.5 meq/L (ref 3.5–5.1)
Sodium: 136 mEq/L (ref 135–145)

## 2014-06-02 LAB — LIPID PANEL
CHOL/HDL RATIO: 5
Cholesterol: 174 mg/dL (ref 0–200)
HDL: 36.4 mg/dL — AB (ref >39.00)
NonHDL: 137.6
TRIGLYCERIDES: 276 mg/dL — AB (ref 0.0–149.0)
VLDL: 55.2 mg/dL — ABNORMAL HIGH (ref 0.0–40.0)

## 2014-06-02 LAB — LDL CHOLESTEROL, DIRECT: LDL DIRECT: 99 mg/dL

## 2014-06-19 ENCOUNTER — Telehealth: Payer: Self-pay | Admitting: Cardiology

## 2014-06-19 DIAGNOSIS — E785 Hyperlipidemia, unspecified: Secondary | ICD-10-CM

## 2014-06-19 MED ORDER — EZETIMIBE 10 MG PO TABS: 10.0000 mg | ORAL_TABLET | Freq: Every day | ORAL | Status: DC

## 2014-06-19 NOTE — Telephone Encounter (Signed)
Follow up     Calling again to get lab results

## 2014-06-19 NOTE — Telephone Encounter (Signed)
New message      Pt received a letter from [facility] to call regarding her lab results

## 2014-06-19 NOTE — Telephone Encounter (Signed)
Notes Recorded by Sueanne Margarita, MD on 06/03/2014 at 4:17 PM LDL not at goal - please have her start Zetia 10mg  daily and recheck FLP and ALT in 8 weeks. Also please forward this to her PCP as her BS is poorly controlled  Patient informed of results and verbal understanding expressed.   Instructed patient to START ZETIA 10 mg daily. FLP and ALT scheduled for May 4th. Routed to PCP.

## 2014-06-20 DIAGNOSIS — F341 Dysthymic disorder: Secondary | ICD-10-CM | POA: Insufficient documentation

## 2014-07-13 ENCOUNTER — Ambulatory Visit (INDEPENDENT_AMBULATORY_CARE_PROVIDER_SITE_OTHER): Payer: PPO

## 2014-07-13 VITALS — BP 128/52 | HR 70 | Resp 12

## 2014-07-13 DIAGNOSIS — M79676 Pain in unspecified toe(s): Secondary | ICD-10-CM | POA: Diagnosis not present

## 2014-07-13 DIAGNOSIS — B351 Tinea unguium: Secondary | ICD-10-CM

## 2014-07-13 DIAGNOSIS — L600 Ingrowing nail: Secondary | ICD-10-CM

## 2014-07-13 DIAGNOSIS — L603 Nail dystrophy: Secondary | ICD-10-CM

## 2014-07-13 NOTE — Patient Instructions (Signed)
Onychomycosis/Fungal Toenails  WHAT IS IT? An infection that lies within the keratin of your nail plate that is caused by a fungus.  WHY ME? Fungal infections affect all ages, sexes, races, and creeds.  There may be many factors that predispose you to a fungal infection such as age, coexisting medical conditions such as diabetes, or an autoimmune disease; stress, medications, fatigue, genetics, etc.  Bottom line: fungus thrives in a warm, moist environment and your shoes offer such a location.  IS IT CONTAGIOUS? Theoretically, yes.  You do not want to share shoes, nail clippers or files with someone who has fungal toenails.  Walking around barefoot in the same room or sleeping in the same bed is unlikely to transfer the organism.  It is important to realize, however, that fungus can spread easily from one nail to the next on the same foot.  HOW DO WE TREAT THIS?  There are several ways to treat this condition.  Treatment may depend on many factors such as age, medications, pregnancy, liver and kidney conditions, etc.  It is best to ask your doctor which options are available to you.  1. No treatment.   Unlike many other medical concerns, you can live with this condition.  However for many people this can be a painful condition and may lead to ingrown toenails or a bacterial infection.  It is recommended that you keep the nails cut short to help reduce the amount of fungal nail. 2. Topical treatment.  These range from herbal remedies to prescription strength nail lacquers.  About 40-50% effective, topicals require twice daily application for approximately 9 to 12 months or until an entirely new nail has grown out.  The most effective topicals are medical grade medications available through physicians offices. 3. Oral antifungal medications.  With an 80-90% cure rate, the most common oral medication requires 3 to 4 months of therapy and stays in your system for a year as the new nail grows out.  Oral  antifungal medications do require blood work to make sure it is a safe drug for you.  A liver function panel will be performed prior to starting the medication and after the first month of treatment.  It is important to have the blood work performed to avoid any harmful side effects.  In general, this medication safe but blood work is required. 4. Laser Therapy.  This treatment is performed by applying a specialized laser to the affected nail plate.  This therapy is noninvasive, fast, and non-painful.  It is not covered by insurance and is therefore, out of pocket.  The results have been very good with a 80-95% cure rate.  The [facility] is the only practice in the area to offer this therapy. 5. Permanent Nail Avulsion.  Removing the entire nail so that a new nail will not grow back.    Nails both have likely fungal infection which is contributed to their thickness and curling to eventually pinched together on the nailbed. This increases the tendency for ingrowing painful nails and possibly even infection of the nailbed below the nail. This is both partially hereditary the tendency for curling of the nails as well as a history of trauma and fungal infection over the years.  Recommendation for chronically ingrowing and deformed nails such as yours is a permanent nail excision, procedure done in the office under local anesthetic block. This would involve removing the nail plate cauterizing the Route with a chemical cautery utilizing phenol. Postoperative care  would consist of daily dressing changes with Neosporin or antibiotic ointment and a Band-Aid dressing as well as Betadine or Epson salts soaks of the toe daily. Within 2-4 weeks the nailbeds would heal and toughen to a point where they can be painted with nail polish recommended gel to be applied to reconstruct artificial nail.

## 2014-07-13 NOTE — Progress Notes (Signed)
   Subjective:    Patient ID: Alyssa [name], female    DOB: 1935/06/06, 79 y.o.   MRN: [id number]  HPI  PT STATED HAVE HISTORY OF INGROWN TOENAIL ON THE LT FOOT GREAT TOENAIL WHICH REMOVING THE SIDE OF THE NAIL AND NOW B/L GREAT TOENAIL ARE SORE FOR 2 MONTHS. THE TOE TOENAILS ARE CURVING IN AND GETTING WORSE ESPECIALLY WHEN PUTTING PRESSURE ON IT. PT DAUGHTER TRIED TO KEEP THEM TRIM DOWN AND IT HELP SOME.  Review of Systems  Constitutional: Positive for activity change and fatigue.  Eyes: Positive for visual disturbance.  Genitourinary: Positive for urgency.  Musculoskeletal: Positive for gait problem.  Hematological: Bruises/bleeds easily.       Objective:   Physical Exam 79 year old white female well-developed well-nourished oriented 3 presents this time with a history of ingrowing nails left more painful than right pain on touching of the toe shoes can't touch the top of the toe or the end of the toe without having pain or significant discomfort recently had paronychia lateral border of the right great toe with some dry eschar still present both nails hallux are criptotic incurvated thickened and discolored. Left much more significantly curled than right. There is history of injury trauma onychomycosis affecting the nails does have a history of diabetes with complications and neuropathy. Is a some issues of possible vascular disease however pedal pulses are palpable DP +2 PT plus one over 4 Refill time 3 seconds all digits epicritic and proprioceptive sensations appear to be intact there is normal plantar response DTRs not listed there is decreased sensation to the forefoot and digits on [employer]. No active open wounds or ulcers are secondary infection at the current time mild digital contractures noted 2 through 5 looks is otherwise rectus.       Assessment & Plan:  Assessment diabetes with history of complications peripheral neuropathy mild angiopathy this time painful ingrown  nails both great toes left more so than right nails thick and mycotic and criptotic incurvated with nail dystrophy being noted. Plan at this time my recommendations nail excision patient is not ready to have it done at current visit however is interested and we'll schedule follow-up visit for AP nails both hallux sometime at her convenience in the near future. Patient or stands procedure be done under local anesthetic block nail excision followed by phenol matricectomy will be carried out with taking her from 2-4 weeks of soaking and dressing changes with Neosporin and Band-Aid daily to heal the wounds and nail beds following the procedure. She understands the risks refill is would be proactive procedure as she does have diabetes and infection is are certainly a higher risk of complications proactively removing the nails that are severely incurvated and deformed may prevent future complication  Harriet Masson DPM

## 2014-07-31 ENCOUNTER — Ambulatory Visit: Payer: PPO

## 2014-08-10 ENCOUNTER — Encounter: Payer: PPO | Admitting: Podiatrist

## 2014-08-16 ENCOUNTER — Other Ambulatory Visit: Payer: PPO

## 2014-09-01 ENCOUNTER — Encounter: Payer: Self-pay | Admitting: Cardiology

## 2014-09-07 ENCOUNTER — Encounter: Payer: Self-pay | Admitting: Podiatrist

## 2014-09-07 ENCOUNTER — Ambulatory Visit (INDEPENDENT_AMBULATORY_CARE_PROVIDER_SITE_OTHER): Payer: PPO | Admitting: Podiatrist

## 2014-09-07 VITALS — BP 105/59 | HR 77 | Resp 18

## 2014-09-07 DIAGNOSIS — L600 Ingrowing nail: Secondary | ICD-10-CM

## 2014-09-07 MED ORDER — CEPHALEXIN 500 MG PO CAPS: 500.0000 mg | ORAL_CAPSULE | Freq: Three times a day (TID) | ORAL | Status: DC

## 2014-09-07 NOTE — Patient Instructions (Signed)

## 2014-09-12 NOTE — Progress Notes (Signed)
Chief Complaint  Patient presents with  . Ingrown Toenail    I HAVE TWO INGROWN TOENAILS THAT NEED TO COME OFF AND HURTS WITH PRESSURE AND CAN'T WEAR A SHOE AND SORE AND TENDER AND THE LEFT IS WORSE AND CURLING IN AND NO DRAINING AND I DID FALL TUESDAY AND HURT MY RIGHT KNEE  . Ingrown Toenail    ALSO HAD A HEART ATTACK IN DEC      HPI: Patient is 79 y.o. female who presents today for pain in great toenails left moreso than right.  She is requesting the nails be removed permanently at today's visit.     Allergies  Allergen Reactions  . Tetanus Toxoid, Adsorbed Rash and Swelling  . Tetanus Toxoids Swelling and Rash    Physical Exam  Patient is awake, alert, and oriented x 3.  In no acute distress.  Vascular status is intact with palpable pedal pulses at 1/4 DP and 2/4 PT bilateral and capillary refill time within normal limits. Neurological sensation is also intact bilaterally via Semmes Weinstein monofilament at 5/5 sites. Light touch, vibratory sensation, Achilles tendon reflex is intact. Dermatological exam reveals skin color, turger and texture as normal. No open lesions present.  Musculature intact with dorsiflexion, plantarflexion, inversion, eversion.  Left hallux nail is a pincer nail and incurvated on the medial and lateral corners. No redness, swelling or sign of infection is noted. Right hallux is mildly incurvated with no sign of infection present.  Remainder of nails are assymptomatic. Patient relates she has spoken with Dr. Blenda Mounts about having the nails removed at the same time.    Assessment: ingrown hallux nail left more than right  Plan: I agreed to perform an ap nail procedure on the left and she will be seen back to have the right great toenail removed in 2 weeks at her follow up apointment.   patient agreed.  left hallux was prepped with alcohol and a 1 to 1 mix of 0.5% marcaine plain and 2% lidocaine plain was administered in a digital block fashion.  The toe was then  prepped with betadine solution and exsanguinated.  The offending nail was then excised and matrix tissue exposed.  Phenol was then applied to the matrix tissue followed by an alcohol wash.  Antibiotic ointment and a dry sterile dressing was applied.  The patient was dispensed instructions for aftercare.  She will call if any increased redness, swelling arise of if any signs of infection arise.

## 2014-09-13 ENCOUNTER — Ambulatory Visit: Payer: PPO | Admitting: Podiatry

## 2014-09-25 ENCOUNTER — Ambulatory Visit (INDEPENDENT_AMBULATORY_CARE_PROVIDER_SITE_OTHER): Payer: PPO | Admitting: Podiatry

## 2014-09-25 ENCOUNTER — Encounter: Payer: Self-pay | Admitting: Podiatry

## 2014-09-25 VITALS — BP 130/64 | HR 87 | Resp 18

## 2014-09-25 DIAGNOSIS — M79676 Pain in unspecified toe(s): Secondary | ICD-10-CM

## 2014-09-25 DIAGNOSIS — B351 Tinea unguium: Secondary | ICD-10-CM

## 2014-09-25 NOTE — Progress Notes (Signed)
Patient ID: RITHIKA SEEL, female   DOB: 1936-02-02, 79 y.o.   MRN: [id number] S.  This patient returns to office following nail surgery by Dr. Valentina Lucks for permanent removal of left great toenail.  She says the toe still hurts and only wears sandals due to the pain.  She had a bad experience during the surgery.  She presents saying minimal drainage noted.  OObjective: Review of past medical history, medications, social history and allergies were performed.  Vascular: Dorsalis pedis and posterior tibial pulses were palpable B/L, capillary refill was  WNL B/L, temperature gradient was WNL B/L   Skin:  No signs of symptoms of infection or ulcers on both feet  Nails: Healing of nail bed is about 50% but no drainage noted.  No signs of infection.  Sensory: Thornell Mule monifilament WNL   Orthopedic: Orthopedic evaluation demonstrates all joints distal t ankle have full ROM without crepitus, muscle power WNL B/L  A.  ROV  P. Debride necrotic tissue.  Continue home soaks.  Since she is diabetic she was told to return in 2 weeks.

## 2014-10-11 ENCOUNTER — Encounter: Payer: Self-pay | Admitting: Podiatry

## 2014-10-11 ENCOUNTER — Ambulatory Visit (INDEPENDENT_AMBULATORY_CARE_PROVIDER_SITE_OTHER): Payer: PPO | Admitting: Podiatry

## 2014-10-11 VITALS — BP 109/42 | HR 77 | Resp 18

## 2014-10-11 DIAGNOSIS — L03032 Cellulitis of left toe: Secondary | ICD-10-CM

## 2014-10-11 MED ORDER — DOXYCYCLINE HYCLATE 100 MG PO TABS: 100.0000 mg | ORAL_TABLET | Freq: Two times a day (BID) | ORAL | Status: DC

## 2014-10-11 NOTE — Progress Notes (Signed)
Subjective:     Patient ID: Alyssa [name], female   DOB: 17-Jul-1935, 79 y.o.   MRN: [id number]  HPI This patient returns to the office for her big toenail left foot.  She was seen 2 weeks ago and told to soak her toe and return in 2 weeks.  She presents today and the toe appears to now be red with inflammation and drainage,  Review of Systems     Objective:   Physical Exam Objective: Review of past medical history, medications, social history and allergies were performed.  Vascular: Dorsalis pedis and posterior tibial pulses were palpable B/L, capillary refill was  WNL B/L, temperature gradient was WNL B/L   Skin:  No signs of symptoms of infection or ulcers on both feet  Nails:  Drainage noted left hallux with peeling and masceration noted at proximal nail fold.  Sensory: Thornell Mule monifilament WNL   Orthopedic: Orthopedic evaluation demonstrates all joints distal t ankle have full ROM without crepitus, muscle power WNL B/L     Assessment:     Cellulitis left hallux.     Plan:     ROV  Told to stop soaking her toe and using neosporin.  Prescribed doxycycline 100 mg # 20

## 2014-10-30 ENCOUNTER — Encounter: Payer: Self-pay | Admitting: Podiatry

## 2014-10-30 ENCOUNTER — Ambulatory Visit (INDEPENDENT_AMBULATORY_CARE_PROVIDER_SITE_OTHER): Payer: PPO | Admitting: Podiatry

## 2014-10-30 VITALS — BP 113/57 | HR 71 | Resp 18

## 2014-10-30 DIAGNOSIS — M79676 Pain in unspecified toe(s): Secondary | ICD-10-CM

## 2014-10-30 DIAGNOSIS — L03032 Cellulitis of left toe: Secondary | ICD-10-CM

## 2014-10-30 MED ORDER — DOXYCYCLINE HYCLATE 100 MG PO TABS: 100.0000 mg | ORAL_TABLET | Freq: Two times a day (BID) | ORAL | Status: DC

## 2014-10-30 NOTE — Progress Notes (Signed)
Patient ID: Alyssa [name], female   DOB: [birth year], 79 y.o.   MRN: [id number] This patient presents for evaluation of her big toe.  She has surgery performed on her toe and the toe then became infected.  She has been on doxycycline and the toe is better except for Thursday night throbbing.  She fell and had ankle sprain right foot for which she is in cast and being treated by another doctor.  She denies drainage from the toe site.  O. GENERAL APPEARANCE: Alert, conversant. Appropriately groomed. No acute distress.  VASCULAR: Pedal pulses palpable at 2/4 DP and PT bilateral.  Capillary refill time is immediate to all digits,  Proximal to distal cooling it warm to warm.  Digital hair growth is present bilateral  NEUROLOGIC: sensation is intact epicritically and protectively to 5.07 monofilament at 5/5 sites bilateral.  Light touch is intact bilateral, vibratory sensation intact bilateral, achilles tendon reflex is intact bilateral.  MUSCULOSKELETAL: acceptable muscle strength, tone and stability bilateral.  Intrinsic muscluature intact bilateral.  Rectus appearance of foot and digits noted bilateral.   DERMATOLOGIC: skin color, texture, and turgor are within normal limits.  . There is black eschar at nail bed left foot.  The masceration and drainage has resolved and the toe is doing better.  I believe she is healing under the eschar.  There is brown drainage noted.  Proximal nail fold has healed..   A.  Healing nail surgery.  Paronychia left hallux.  Cellulitis left hallux  P.  ROV.  Prescribed doxycycline for 10 days.  RTC 2 weeks

## 2014-11-13 ENCOUNTER — Ambulatory Visit (INDEPENDENT_AMBULATORY_CARE_PROVIDER_SITE_OTHER): Payer: PPO | Admitting: Podiatry

## 2014-11-13 ENCOUNTER — Encounter: Payer: Self-pay | Admitting: Podiatry

## 2014-11-13 VITALS — BP 116/50 | HR 82 | Resp 18

## 2014-11-13 DIAGNOSIS — M79676 Pain in unspecified toe(s): Secondary | ICD-10-CM | POA: Diagnosis not present

## 2014-11-13 DIAGNOSIS — L600 Ingrowing nail: Secondary | ICD-10-CM

## 2014-11-13 NOTE — Progress Notes (Signed)
Subjective:     Patient ID: Alyssa [name], female   DOB: [birth year], 79 y.o.   MRN: [id number]  HPIThis patient returns for continued monitoring her big toe left foot healing.  She says it is sensitive but no drainage or infection noted.  Her second toe left foot has started becoming red and swollen and has awaken her at night.  Her infection seems improved.   Review of Systems     Objective:   Physical Exam GENERAL APPEARANCE: Alert, conversant. Appropriately groomed. No acute distress.  VASCULAR: Pedal pulses palpable at 2/4 DP and PT bilateral.  Capillary refill time is immediate to all digits,  Proximal to distal cooling it warm to warm.  Digital hair growth is present bilateral  NEUROLOGIC: sensation is intact epicritically and protectively to 5.07 monofilament at 5/5 sites bilateral.  Light touch is intact bilateral, vibratory sensation intact bilateral, achilles tendon reflex is intact bilateral.  MUSCULOSKELETAL: acceptable muscle strength, tone and stability bilateral.  Intrinsic muscluature intact bilateral.  Rectus appearance of foot and digits noted bilateral.   DERMATOLOGIC: skin color, texture, and turgor are within normal limits.  No preulcerative lesions or ulcers  are seen, no interdigital maceration noted.  No open lesions present.   No drainage noted. There is persistant black eschar left hallux in the absence of infection.  There is redness at proximal nail fold.      Assessment:     S/p nail surgery     Plan:     ROV  Home instructions given.  Debrided second toenail left foot.

## 2015-01-01 ENCOUNTER — Encounter: Payer: Self-pay | Admitting: Cardiology

## 2015-01-01 ENCOUNTER — Ambulatory Visit (INDEPENDENT_AMBULATORY_CARE_PROVIDER_SITE_OTHER): Payer: PPO | Admitting: Cardiology

## 2015-01-01 VITALS — BP 124/62 | HR 71 | Ht 61.0 in | Wt 163.4 lb

## 2015-01-01 DIAGNOSIS — I10 Essential (primary) hypertension: Secondary | ICD-10-CM

## 2015-01-01 DIAGNOSIS — I2510 Atherosclerotic heart disease of native coronary artery without angina pectoris: Secondary | ICD-10-CM

## 2015-01-01 DIAGNOSIS — E785 Hyperlipidemia, unspecified: Secondary | ICD-10-CM | POA: Diagnosis not present

## 2015-01-01 DIAGNOSIS — R079 Chest pain, unspecified: Secondary | ICD-10-CM

## 2015-01-01 DIAGNOSIS — I5032 Chronic diastolic (congestive) heart failure: Secondary | ICD-10-CM | POA: Diagnosis not present

## 2015-01-01 DIAGNOSIS — I2583 Coronary atherosclerosis due to lipid rich plaque: Secondary | ICD-10-CM

## 2015-01-01 NOTE — Progress Notes (Signed)
Cardiology Office Note   Date:  01/01/2015   ID:  Alyssa [name], DOB Oct 20, 1935, MRN [id number]  PCP:  Alyssa Sake, MD    Chief Complaint  Patient presents with  . Coronary Artery Disease      History of Present Illness: Alyssa [name] is a 79 y.o. female with history of CAD (DES to LAD 2013, NSTEMI s/p DES to OM1 and DES to RCA 03/2014) and NSTEMI , HTN, chronic diastolic CHF, GERD, hypothyroidism, DM and PVD who presents for followup. She recently had an episode of chest discomfort after working hard and went to the ER and was admitted overnight.  She was sent home after normal workup.  Since then she has not had any further episodes but is complaining of increased fatigue which was her anginal equivalent in the past. She occasionally has some LE edema that is sporadic. She denies any SOB, DOE, palptiations, dizziness or syncope.    Past Medical History  Diagnosis Date  . Hypothyroid   . HLD (hyperlipidemia)   . CAD (coronary artery disease)     a. 2013 NSTEMI s/p DES to LAD. b. 04/03/14: NSTEMI s/p DES to OM1, DES to dRCA.  Alyssa [name] HTN (hypertension)   . Chronic diastolic CHF (congestive heart failure)     a. 2D ECHO 04/02/14 with hypokinesis in the basal inferior and inferoseptal walls. Focal and moderate concentric LVH. Overall LVEF 60-65%. G1DD. Elevated EDLV filling pressures. Mild TR.  Alyssa [name] GERD (gastroesophageal reflux disease)   . Refusal of blood transfusions as patient is Jehovah's Witness 09-29-12  . Type II diabetes mellitus   . Peripheral vascular disease   . History of GI bleed     a. 2013: adm for ABL anemia. 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  . Anemia   . Hypertensive heart disease     Past Surgical History  Procedure Laterality Date  . Tonsillectomy    . Esophagogastroduodenoscopy  02/26/2012    Procedure: ESOPHAGOGASTRODUODENOSCOPY  (EGD);  Surgeon: Lear Ng, MD;  Location: [facility] ENDOSCOPY;  Service: Endoscopy;  Laterality: N/A;  . Colonoscopy  02/27/2012    Procedure: COLONOSCOPY;  Surgeon: Lear Ng, MD;  Location: [facility] ENDOSCOPY;  Service: Endoscopy;  Laterality: N/A;  . Cesarean section  ~ 1961; 1966    plus 3 NVD  . Left heart catheterization with coronary angiogram N/A 06/12/2011    Procedure: LEFT HEART CATHETERIZATION WITH CORONARY ANGIOGRAM;  Surgeon: Josue Hector, MD;  Location: [facility] CATH LAB;  Service: Cardiovascular;  Laterality: N/A;  . Coronary angioplasty with stent placement  05/2011; 04/03/2014  . Vaginal hysterectomy    . Left heart catheterization with coronary angiogram N/A 04/03/2014    Procedure: LEFT HEART CATHETERIZATION WITH CORONARY ANGIOGRAM;  Surgeon: Jettie Booze, MD;  Location: [facility] CATH LAB;  Service: Cardiovascular;  Laterality: N/A;     Current Outpatient Prescriptions  Medication Sig Dispense Refill  . amitriptyline (ELAVIL) 10 MG tablet Take 10 mg by mouth at bedtime as needed for sleep.     Alyssa [name] aspirin 81 MG tablet Take 81 mg by mouth daily.    Alyssa [name] atorvastatin (LIPITOR) 80 MG tablet Take 1 tablet (80 mg total) by mouth daily at 6 PM. 30 tablet 11  . carvedilol (COREG) 3.125 MG tablet Take 3.125 mg by mouth 2 (two)  times daily with a meal.     . Coenzyme Q10 (COQ10 PO) Take 1 capsule by mouth daily.    . diclofenac sodium (VOLTAREN) 1 % GEL Apply 2 g topically daily as needed (pain).    Alyssa [name] doxycycline (VIBRA-TABS) 100 MG tablet Take 1 tablet (100 mg total) by mouth 2 (two) times daily. 20 tablet 0  . ezetimibe (ZETIA) 10 MG tablet Take 1 tablet (10 mg total) by mouth daily. 30 tablet 6  . furosemide (LASIX) 20 MG tablet Take 20 mg by mouth daily.     Alyssa [name] gabapentin (NEURONTIN) 600 MG tablet Take 600 mg by mouth 3 (three) times daily.   5  . glimepiride (AMARYL) 4 MG tablet Take 4 mg by mouth daily with breakfast.     . HYDROcodone-acetaminophen (NORCO/VICODIN) 5-325 MG  per tablet Take 1 tablet by mouth every 6 (six) hours as needed. for pain  0  . insulin glargine (LANTUS) 100 UNIT/ML injection Inject 10 Units into the skin at bedtime.    Alyssa [name] levothyroxine (SYNTHROID, LEVOTHROID) 125 MCG tablet Take by mouth.    . losartan (COZAAR) 50 MG tablet Take 1 tablet (50 mg total) by mouth daily. 90 tablet 3  . metFORMIN (GLUCOPHAGE) 500 MG tablet Take 1 tablet (500 mg total) by mouth 2 (two) times daily with a meal. 60 tablet 11  . naproxen (NAPROSYN) 500 MG tablet Take 500 mg by mouth 2 (two) times daily.  0  . nitroGLYCERIN (NITROSTAT) 0.4 MG SL tablet Place 0.4 mg under the tongue every 5 (five) minutes as needed for chest pain.     . polyethylene glycol (MIRALAX / GLYCOLAX) packet Take 17 g by mouth daily as needed (constipation).     Alyssa [name] spironolactone (ALDACTONE) 25 MG tablet Take 12.5 mg by mouth daily.     . ticagrelor (BRILINTA) 90 MG TABS tablet Take 1 tablet (90 mg total) by mouth 2 (two) times daily. 60 tablet 11  . traMADol (ULTRAM) 50 MG tablet take 1 tablet by mouth every 4 hours if needed for pain  0   No current facility-administered medications for this visit.    Allergies:   Tetanus toxoid, adsorbed and Tetanus toxoids    Social History:  The patient  reports that she quit smoking about 44 years ago. Her smoking use included Cigarettes. She has a 24 pack-year smoking history. She has never used smokeless tobacco. She reports that she drinks alcohol. She reports that she does not use illicit drugs.   Family History:  The patient's family history includes Arthritis in her father; Hypertension in an other family member.    ROS:  Please see the history of present illness.   Otherwise, review of systems are positive for none.   All other systems are reviewed and negative.    PHYSICAL EXAM: VS:  BP 124/62 mmHg  Pulse 71  Ht 5\' 1"  (1.549 m)  Wt 163 lb 6.4 oz (74.118 kg)  BMI 30.89 kg/m2 , BMI Body mass index is 30.89 kg/(m^2). GEN: Well nourished,  well developed, in no acute distress HEENT: normal Neck: no JVD, carotid bruits, or masses Cardiac: RRR; no murmurs, rubs, or gallops,no edema  Respiratory:  clear to auscultation bilaterally, normal work of breathing GI: soft, nontender, nondistended, + BS MS: no deformity or atrophy Skin: warm and dry, no rash Neuro:  Strength and sensation are intact Psych: euthymic mood, full affect   EKG:  EKG is not ordered today.    Recent Labs:  04/01/2014: Pro B Natriuretic peptide (BNP) 853.7*; TSH 7.250* 04/02/2014: Magnesium 2.0 04/04/2014: Hemoglobin 11.2*; Platelets 243 06/02/2014: ALT 20; BUN 16; Creatinine, Ser 0.89; Potassium 4.5; Sodium 136    Lipid Panel    Component Value Date/Time   CHOL 174 06/02/2014 1203   TRIG 276.0* 06/02/2014 1203   HDL 36.40* 06/02/2014 1203   CHOLHDL 5 06/02/2014 1203   VLDL 55.2* 06/02/2014 1203   LDLDIRECT 99.0 06/02/2014 1203      Wt Readings from Last 3 Encounters:  01/01/15 163 lb 6.4 oz (74.118 kg)  05/22/14 161 lb 3.2 oz (73.12 kg)  04/11/14 164 lb 3.2 oz (74.481 kg)    ASSESSMENT AND PLAN:  1. CAD with NSTEMI/PCI as above - continue aspirin, Brilinta, BB, statin. I will check a Lexiscan myoview to rule out ischemia given recent episode of CP and fatigue.  Decrease Brilinta to 60mg  BID at next OV. 2. Essential HTN - stable on BB, aldactone and ARB 3. Chronic diastolic CHF with hypertensive heart disease - appears euvolemic. Continue Lasix. Check BMET 4. Hyperlipidemia - check lipids/LFTs - her last LDL was not at goal in Feb 16.  Continue statin/Zetia  Current medicines are reviewed at length with the patient today.  The patient does not have concerns regarding medicines.  The following changes have been made:  no change  Labs/ tests ordered today: See above Assessment and Plan No orders of the defined types were placed in this encounter.     Disposition:   FU with me in 6 months  Signed, Sueanne Margarita, MD  01/01/2015  10:15 AM    [facility] [address], [place], [place]  [address] Phone: [phone]; Fax: [phone]

## 2015-01-01 NOTE — Patient Instructions (Signed)
Medication Instructions:  Your physician recommends that you continue on your current medications as directed. Please refer to the Current Medication list given to you today.   Labwork: TODAY: BMET, CBC, Lipids  Testing/Procedures: Your physician has requested that you have a lexiscan myoview. For further information please visit [email or web address]. Please follow instruction sheet, as given.  Follow-Up: Your physician wants you to follow-up in: 6 months with Dr. Radford Pax. You will receive a reminder letter in the mail two months in advance. If you don't receive a letter, please call our office to schedule the follow-up appointment.   Any Other Special Instructions Will Be Listed Below (If Applicable).

## 2015-01-02 ENCOUNTER — Telehealth (HOSPITAL_COMMUNITY): Payer: Self-pay | Admitting: *Deleted

## 2015-01-02 NOTE — Telephone Encounter (Signed)
Patient given detailed instructions per Myocardial Perfusion Study Information Sheet for test on 01/04/15 at 0915. Patient notified to arrive 15 minutes early and that it is imperative to arrive on time for appointment to keep from having the test rescheduled.  If you need to cancel or reschedule your appointment, please call the office within 24 hours of your appointment. Failure to do so may result in a cancellation of your appointment, and a $50 no show fee. Patient verbalized understanding. Hasspacher, Ranae Palms

## 2015-01-04 ENCOUNTER — Ambulatory Visit (HOSPITAL_COMMUNITY): Payer: PPO | Attending: Cardiology

## 2015-01-04 DIAGNOSIS — I10 Essential (primary) hypertension: Secondary | ICD-10-CM | POA: Diagnosis not present

## 2015-01-04 DIAGNOSIS — R079 Chest pain, unspecified: Secondary | ICD-10-CM | POA: Diagnosis not present

## 2015-01-04 DIAGNOSIS — R9439 Abnormal result of other cardiovascular function study: Secondary | ICD-10-CM | POA: Diagnosis not present

## 2015-01-04 DIAGNOSIS — E119 Type 2 diabetes mellitus without complications: Secondary | ICD-10-CM | POA: Diagnosis not present

## 2015-01-04 LAB — MYOCARDIAL PERFUSION IMAGING
CHL CUP NUCLEAR SDS: 14
CHL CUP NUCLEAR SRS: 3
CHL CUP NUCLEAR SSS: 17
LHR: 0.36
LV sys vol: 12 mL
LVDIAVOL: 55 mL
Peak HR: 95 {beats}/min
Rest HR: 69 {beats}/min
TID: 1.13

## 2015-01-04 MED ORDER — TECHNETIUM TC 99M SESTAMIBI GENERIC - CARDIOLITE
9.7000 | Freq: Once | INTRAVENOUS | Status: AC | PRN
  Administered 2015-01-04: 10 via INTRAVENOUS

## 2015-01-04 MED ORDER — TECHNETIUM TC 99M SESTAMIBI GENERIC - CARDIOLITE
32.4000 | Freq: Once | INTRAVENOUS | Status: AC | PRN
  Administered 2015-01-04: 32 via INTRAVENOUS

## 2015-01-04 MED ORDER — REGADENOSON 0.4 MG/5ML IV SOLN
0.4000 mg | Freq: Once | INTRAVENOUS | Status: AC
  Administered 2015-01-04: 0.4 mg via INTRAVENOUS

## 2015-01-11 LAB — ABI

## 2015-01-24 ENCOUNTER — Encounter: Payer: Self-pay | Admitting: Vascular Surgery

## 2015-01-26 ENCOUNTER — Encounter: Payer: Self-pay | Admitting: Vascular Surgery

## 2015-01-26 ENCOUNTER — Ambulatory Visit (INDEPENDENT_AMBULATORY_CARE_PROVIDER_SITE_OTHER): Payer: PPO | Admitting: Vascular Surgery

## 2015-01-26 VITALS — BP 131/83 | HR 70 | Ht 61.0 in | Wt 161.8 lb

## 2015-01-26 DIAGNOSIS — I70213 Atherosclerosis of native arteries of extremities with intermittent claudication, bilateral legs: Secondary | ICD-10-CM | POA: Diagnosis not present

## 2015-01-26 NOTE — Addendum Note (Signed)
Addended by: Mena Goes on: 01/26/2015 03:24 PM   Modules accepted: Orders

## 2015-01-26 NOTE — Progress Notes (Signed)
Vascular and Vein Specialist of [facility]  Patient name: Alyssa [name] MRN: [id number] DOB: 23-Aug-1935 Sex: female  REASON FOR CONSULT: Peripheral vascular disease. Referred by Dr. Clide Dales.   HPI: Alyssa [name] is a 79 y.o. female, who is was referred for evaluation of peripheral vascular disease by her podiatrist. She had developed a wound on her left great toe which has healed at this point. She does admit to bilateral calf claudication. Her symptoms are brought on by ambulation and relieved with rest. She also has some hip pain on the right ulna this occurs at rest and I suspect that this is arthritis. She denies any history of nonhealing wounds.  Her risk factors for peripheral vascular disease include diabetes, hypertension, and remote history of tobacco use. She denies any history of hypercholesterolemia, or family history of premature cardiovascular disease.  I have reviewed the records from [facility]. She has a history of type 2 diabetes which has been well controlled. They had ordered an arterial Doppler study which was done on 01/11/2015. This showed by duplex a mid superficial femoral artery occlusion in addition to disease of the distal left popliteal artery. ABI on the right was 61% and ABI on the left was 61%.   Past Medical History  Diagnosis Date  . Hypothyroid   . HLD (hyperlipidemia)   . CAD (coronary artery disease)     a. 2013 NSTEMI s/p DES to LAD. b. 04/03/14: NSTEMI s/p DES to OM1, DES to dRCA.  Alyssa [name] Kitchen HTN (hypertension)   . Chronic diastolic CHF (congestive heart failure) ([place])     a. 2D ECHO 04/02/14 with hypokinesis in the basal inferior and inferoseptal walls. Focal and moderate concentric LVH. Overall LVEF 60-65%. G1DD. Elevated EDLV filling pressures. Mild TR.  Alyssa [name] Kitchen GERD (gastroesophageal reflux disease)   . Refusal of blood transfusions as patient is Jehovah's Witness 09-29-12  . Type II diabetes mellitus  ([place])   . Peripheral vascular disease ([place])   . History of GI bleed     a. 2013: adm for ABL anemia. 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  . Anemia   . Hypertensive heart disease     Family History  Problem Relation Age of Onset  . Hypertension    . Arthritis Father   . Heart disease Mother   . Varicose Veins Mother    She denies any family history of premature cardiovascular disease.  SOCIAL HISTORY: Social History   Social History  . Marital Status: Married    Spouse Name: N/A  . Number of Children: N/A  . Years of Education: N/A   Occupational History  . Not on file.   Social History Main Topics  . Smoking status: Former Smoker -- 1.50 packs/day for 16 years    Types: Cigarettes    Quit date: 03/15/1970  . Smokeless tobacco: Never Used  . Alcohol Use: 1.8 - 2.4 oz/week    3-4 Standard drinks or equivalent per week     Comment: 04/03/2014 "glass of wine or a beer 2-3 times/month"  . Drug Use: No  . Sexual Activity: Not Currently   Other Topics Concern  . Not on file   Social History Narrative    Allergies  Allergen Reactions  . Tetanus Toxoid, Adsorbed Rash and Swelling  . Tetanus Toxoids Swelling and Rash    Current Outpatient Prescriptions  Medication  Sig Dispense Refill  . amitriptyline (ELAVIL) 10 MG tablet Take 10 mg by mouth at bedtime as needed for sleep.     Alyssa [name] Kitchen aspirin 81 MG tablet Take 81 mg by mouth daily.    Alyssa [name] Kitchen atorvastatin (LIPITOR) 80 MG tablet Take 1 tablet (80 mg total) by mouth daily at 6 PM. 30 tablet 11  . carvedilol (COREG) 3.125 MG tablet Take 3.125 mg by mouth 2 (two) times daily with a meal.     . Coenzyme Q10 (COQ10 PO) Take 1 capsule by mouth daily.    . diclofenac sodium (VOLTAREN) 1 % GEL Apply 2 g topically daily as needed (pain).    Alyssa [name] Kitchen doxycycline (VIBRA-TABS) 100 MG tablet Take 1 tablet (100 mg total) by mouth 2 (two) times  daily. 20 tablet 0  . ezetimibe (ZETIA) 10 MG tablet Take 1 tablet (10 mg total) by mouth daily. 30 tablet 6  . furosemide (LASIX) 20 MG tablet Take 20 mg by mouth daily.     Alyssa [name] Kitchen gabapentin (NEURONTIN) 600 MG tablet Take 600 mg by mouth 3 (three) times daily.   5  . glimepiride (AMARYL) 4 MG tablet Take 4 mg by mouth daily with breakfast.     . HYDROcodone-acetaminophen (NORCO/VICODIN) 5-325 MG per tablet Take 1 tablet by mouth every 6 (six) hours as needed. for pain  0  . insulin glargine (LANTUS) 100 UNIT/ML injection Inject 10 Units into the skin at bedtime.    Alyssa [name] Kitchen levothyroxine (SYNTHROID, LEVOTHROID) 125 MCG tablet Take by mouth.    . losartan (COZAAR) 50 MG tablet Take 1 tablet (50 mg total) by mouth daily. 90 tablet 3  . metFORMIN (GLUCOPHAGE) 500 MG tablet Take 1 tablet (500 mg total) by mouth 2 (two) times daily with a meal. 60 tablet 11  . naproxen (NAPROSYN) 500 MG tablet Take 500 mg by mouth 2 (two) times daily.  0  . nitroGLYCERIN (NITROSTAT) 0.4 MG SL tablet Place 0.4 mg under the tongue every 5 (five) minutes as needed for chest pain.     . polyethylene glycol (MIRALAX / GLYCOLAX) packet Take 17 g by mouth daily as needed (constipation).     Alyssa [name] Kitchen spironolactone (ALDACTONE) 25 MG tablet Take 12.5 mg by mouth daily.     . ticagrelor (BRILINTA) 90 MG TABS tablet Take 1 tablet (90 mg total) by mouth 2 (two) times daily. 60 tablet 11  . traMADol (ULTRAM) 50 MG tablet take 1 tablet by mouth every 4 hours if needed for pain  0   No current facility-administered medications for this visit.    REVIEW OF SYSTEMS:  [X]  denotes positive finding, [ ]  denotes negative finding Cardiac  Comments:  Chest pain or chest pressure:    Shortness of breath upon exertion:    Short of breath when lying flat:    Irregular heart rhythm:        Vascular    Pain in calf, thigh, or hip brought on by ambulation: X   Pain in feet at night that wakes you up from your sleep:  X   Blood clot in your veins:      Leg swelling:         Pulmonary    Oxygen at home:    Productive cough:     Wheezing:         Neurologic    Sudden weakness in arms or legs:     Sudden numbness in arms or legs:  X   Sudden onset of  difficulty speaking or slurred speech:    Temporary loss of vision in one eye:     Problems with dizziness:         Gastrointestinal    Blood in stool:     Vomited blood:         Genitourinary    Burning when urinating:     Blood in urine:        Psychiatric    Major depression:         Hematologic    Bleeding problems:    Problems with blood clotting too easily:        Skin    Rashes or ulcers:        Constitutional    Fever or chills:      PHYSICAL EXAM: Filed Vitals:   01/26/15 0841  BP: 131/83  Pulse: 70  Height: 5\' 1"  (1.549 m)  Weight: 161 lb 12.8 oz (73.392 kg)  SpO2: 98%    GENERAL: The patient is a well-nourished female, in no acute distress. The vital signs are documented above. CARDIAC: There is a regular rate and rhythm.  VASCULAR: I do not detect carotid bruits. She has palpable femoral pulses. I cannot palpate popliteal or pedal pulses on either side. She has no significant lower extremity swelling. PULMONARY: There is good air exchange bilaterally without wheezing or rales. ABDOMEN: Soft and non-tender with normal pitched bowel sounds. I do not appreciate an aneurysm. MUSCULOSKELETAL: There are no major deformities or cyanosis. NEUROLOGIC: No focal weakness or paresthesias are detected. SKIN: There are no ulcers or rashes noted. PSYCHIATRIC: The patient has a normal affect. DATA:   LOWER EXTREMITY ARTERIAL DUPLEX: I have reviewed the lower extremity arterial duplex that was done by [facility] radiology on 01/11/2015. This shows a mid right superficial femoral artery occlusion. On the left there is disease of the distal popliteal artery.  ARTERIAL DOPPLER STUDY: I have reviewed the arterial Doppler study that was done on 01/09/2015. This shows  monophasic Doppler signals in the right lower extremity with an ABI of 51%. On the left side there are monophasic Doppler signals with an ABI of 61%.   MEDICAL ISSUES:  ATHEROSCLEROSIS WITH CLAUDICATION: Race on her exam and noninvasive studies she has evidence of infrainguinal arterial occlusive disease bilaterally. However she has stable claudication with no rest pain and no history of nonhealing ulcers. This reason I would not recommend arteriography and intervention unless her symptoms progressed. She is not a smoker at this point and quit many years ago. I have encouraged her to get on a structured walking program. I have ordered follow up ABIs in 1 year and I'll see her back at that time. She knows to call sooner if she has problems.   Deitra Mayo [facility] of [place]: [phone]

## 2015-01-28 ENCOUNTER — Other Ambulatory Visit: Payer: Self-pay | Admitting: Cardiology

## 2015-02-07 ENCOUNTER — Encounter: Payer: Self-pay | Admitting: Podiatry

## 2015-03-05 ENCOUNTER — Other Ambulatory Visit: Payer: Self-pay | Admitting: Cardiology

## 2015-05-29 ENCOUNTER — Other Ambulatory Visit: Payer: Self-pay | Admitting: *Deleted

## 2015-05-29 NOTE — Patient Outreach (Signed)
[place] [facility]) Care Management  05/29/2015  Alyssa [name] 1935-09-06 [id number]  RN Health Coach telephone introductory  call to patient.  Hipaa compliance verified. Patient is very agreeable to [place] following up on her diabetes. Patient was getting ready to go out and requested that I call her tomorrow. RN agreed to call 05/30/2015.  Johny Shock, BSN, RN [facility] Care Management RN Health Coach Phone: [place] complies with applicable Federal civil rights laws and does not discriminate on the basis of race, color, national origin, age, disability, or sex. Espaol (Spanish)  [place] cumple con las leyes federales de derechos civiles aplicables y no discrimina por motivos de raza, color, nacionalidad, edad, discapacidad o sexo.    [place]  [place] tun th? lu?t dn quy?n hi?n hnh c?a Lin bang v khng phn bi?t ?i x? d?a trn ch?ng t?c, mu da, ngu?n g?c qu?c gia, ? tu?i, khuy?t t?t, ho?c gi?i tnh.    (Arabic)    [place]                      .

## 2015-05-30 ENCOUNTER — Ambulatory Visit: Payer: Self-pay | Admitting: *Deleted

## 2015-05-30 ENCOUNTER — Other Ambulatory Visit: Payer: Self-pay | Admitting: *Deleted

## 2015-05-30 NOTE — Patient Outreach (Signed)
[place] [facility]) Care Management  05/30/2015  NERIA THIBAUDEAU 03/26/1936 [id number]   RN Health Coach  attempted 2nd initial outreach call to patient.  Patient was unavailable. HIPPA compliance voicemail message was left with return callback number.    [place] Care Management [phone]

## 2015-05-31 ENCOUNTER — Other Ambulatory Visit: Payer: Self-pay | Admitting: *Deleted

## 2015-05-31 NOTE — Patient Outreach (Signed)
[place] [facility]) Care Management  Erroneous encounter

## 2015-06-04 ENCOUNTER — Encounter: Payer: Self-pay | Admitting: *Deleted

## 2015-06-04 NOTE — Patient Outreach (Signed)
[place] [facility]) Care Management  [place]  [id number]  Alyssa [name] 07/09/1935 [id number]  Subjective: RN Health Coach telephone call to patient.  Hipaa compliance verified. This patient is a self referral. Per patient she has been a diabetic for several years. Per patient she doesn't take good care of herself. Per patient she had taken her diabetes medication but has not checked her blood sugar today. Per patient she does check her feet daily. Patient does not know the signs and symptoms of hypo and hyperglycemia. Patient uses a cane to ambulate up the stairs. Patient last HGB A1C was 8.3 on 03/23/2015. Patient did not know what the A1c was. Patient is not following a diabetic diet. Patient does go to a podiatrist. Patient has went for an eye exam and has new glasses.    Objective:   Current Medications:  Current Outpatient Prescriptions  Medication Sig Dispense Refill  . amitriptyline (ELAVIL) 10 MG tablet Take 10 mg by mouth at bedtime as needed for sleep.     [name] aspirin 81 MG tablet Take 81 mg by mouth daily.    . carvedilol (COREG) 3.125 MG tablet Take 3.125 mg by mouth 2 (two) times daily with a meal.     . Coenzyme Q10 (COQ10 PO) Take 1 capsule by mouth daily.    . furosemide (LASIX) 20 MG tablet Take 20 mg by mouth daily.     [name] glimepiride (AMARYL) 4 MG tablet Take 4 mg by mouth daily with breakfast.     . insulin glargine (LANTUS) 100 UNIT/ML injection Inject 10 Units into the skin at bedtime.    [name] levothyroxine (SYNTHROID, LEVOTHROID) 125 MCG tablet Take by mouth.    . losartan (COZAAR) 50 MG tablet Take 1 tablet (50 mg total) by mouth daily. 90 tablet 3  . metFORMIN (GLUCOPHAGE) 500 MG tablet Take 1 tablet (500 mg total) by mouth 2 (two) times daily with a meal. 60 tablet 11  . nitroGLYCERIN (NITROSTAT) 0.4 MG SL tablet Place 0.4 mg under the tongue every 5 (five) minutes as needed for chest pain.     . polyethylene glycol (MIRALAX / GLYCOLAX) packet Take 17  g by mouth daily as needed (constipation).     [name] spironolactone (ALDACTONE) 25 MG tablet Take 12.5 mg by mouth daily.     [name] ZETIA 10 MG tablet TAKE 1 TABLET BY MOUTH EVERY DAY 30 tablet 6  . atorvastatin (LIPITOR) 80 MG tablet Take 1 tablet (80 mg total) by mouth daily at 6 PM. (Patient not taking: Reported on 06/04/2015) 30 tablet 11  . diclofenac sodium (VOLTAREN) 1 % GEL Apply 2 g topically daily as needed (pain). Reported on 06/04/2015    . doxycycline (VIBRA-TABS) 100 MG tablet Take 1 tablet (100 mg total) by mouth 2 (two) times daily. (Patient not taking: Reported on 06/04/2015) 20 tablet 0  . gabapentin (NEURONTIN) 600 MG tablet Take 600 mg by mouth 3 (three) times daily. Reported on 06/04/2015  5  . HYDROcodone-acetaminophen (NORCO/VICODIN) 5-325 MG per tablet Take 1 tablet by mouth every 6 (six) hours as needed. Reported on 06/04/2015  0  . naproxen (NAPROSYN) 500 MG tablet Take 500 mg by mouth 2 (two) times daily. Reported on 06/04/2015  0  . ticagrelor (BRILINTA) 90 MG TABS tablet Take 1 tablet (90 mg total) by mouth 2 (two) times daily. (Patient not taking: Reported on 06/04/2015) 60 tablet 11  . traMADol (ULTRAM) 50 MG tablet Reported on 06/04/2015  0   No current facility-administered medications for this visit.    Functional Status:  In your present state of health, do you have any difficulty performing the following activities: 05/31/2015  Hearing? N  Vision? N  Difficulty concentrating or making decisions? N  Walking or climbing stairs? Y  Dressing or bathing? N  Doing errands, shopping? N  Preparing Food and eating ? N  Using the Toilet? N  In the past six months, have you accidently leaked urine? N  Do you have problems with loss of bowel control? N  Managing your Medications? N  Managing your Finances? N  Housekeeping or managing your Housekeeping? N    Fall/Depression Screening: PHQ 2/9 Scores 05/31/2015  PHQ - 2 Score 1    Assessment: Knowledge Deficit in Self  Management of Diabetes Patient will benefit from [place] telephonic outreach for education and support for Diabetes Education  [facility] CM Care Plan Problem One        Most Recent Value   Care Plan Problem One  Knowledge  deficit in self management of diabetes   Role Documenting the Problem One  [place] for Problem One  Active   THN Long Term Goal (31-90 days)  Elevated blood sugars as evidenced by A1C of 8.3   THN Long Term Goal Start Date  05/31/15   Interventions for Problem One Long Term Goal  Discussed with patient A1C goals. RN sent EMMI information on why get your A1C checked. RN will follow up discussion   THN CM Short Term Goal #1 (0-30 days)  Patient will be able to verbalize why check blood sugars as per Dr orders   [facility] CM Short Term Goal #1 Start Date  05/31/15   Interventions for Short Term Goal #1  RN discussed with patient about checking blood sugars and taking Diabetes medication. RN sent EMMI on When to check blood sugars and Why check your blood sugar   THN CM Short Term Goal #2 (0-30 days)  Patient will be able to verbalize signs and symptoms of hypo and hyperglycemia in 30 days   THN CM Short Term Goal #2 Start Date  05/31/15   Interventions for Short Term Goal #2  Rn disccussed signs and symptoms of hypo and hyperglycemia. RN will send EMMI information on signs and symptoms. Rn will send a picture chart also of symptoms. RN will follow up with discussion and teachback      Plan: [place] will provide ongoing education for patient on diabetes through on phone calls and sending printed information to patient for further discussion. RN Health Coach will send welcome packet and consent . RN sent EMMI information on Diabetes when you are sick RN sent EMMI information on Low Blood Sugar RN sent EMMI information on High Blood Sugar RN sent EMMI information on When to check your blood sugar and why check your blood sugar RN sent EMMI information on  Controlling blood sugar RN sent EMMI information on Why get A1C checked RN sent EMMI information on Preventing heart attacks and strokes  Johny Shock, BSN, [place] [facility] RN Health Coach Phone: [place] complies with applicable Federal civil rights laws and does not discriminate on the basis of race, color, national origin, age, disability, or sex. Espaol (Spanish)  [place] cumple con las leyes federales de derechos civiles aplicables y no discrimina por motivos de raza, color, nacionalidad, edad, discapacidad o sexo.  [place]  [place] tun th? lu?t dn quy?n hi?n hnh c?a Lin bang v khng phn bi?t ?i x? d?a trn ch?ng t?c, mu da, ngu?n g?c qu?c gia, ? tu?i, khuy?t t?t, ho?c gi?i tnh.    (Arabic)    [place]                      .

## 2015-06-05 ENCOUNTER — Encounter: Payer: Self-pay | Admitting: *Deleted

## 2015-06-25 ENCOUNTER — Other Ambulatory Visit: Payer: Self-pay | Admitting: *Deleted

## 2015-06-25 NOTE — Patient Outreach (Signed)
[place] [facility]) Care Management  06/25/2015  Alyssa [name] February 26, 1936 [id number]  RN Health Coach telephone call to patient.  Hipaa compliance verified.  Per patient she did receive the information that was sent . Per patient she had a bad situation last night and now is not a good time. Could I call back another time. RN Health Coach informed her that she would. Johny Shock, BSN, RN [facility] Care Management RN Health Coach Phone: [place] complies with applicable Federal civil rights laws and does not discriminate on the basis of race, color, national origin, age, disability, or sex. Espaol (Spanish)  [place] cumple con las leyes federales de derechos civiles aplicables y no discrimina por motivos de raza, color, nacionalidad, edad, discapacidad o sexo.    [place]  [place] tun th? lu?t dn quy?n hi?n hnh c?a Lin bang v khng phn bi?t ?i x? d?a trn ch?ng t?c, mu [name], ngu?n g?c qu?c gia, ? tu?i, khuy?t t?t, ho?c gi?i tnh.    (Arabic)    [place]                      .

## 2015-06-26 DIAGNOSIS — E782 Mixed hyperlipidemia: Secondary | ICD-10-CM | POA: Diagnosis not present

## 2015-06-26 DIAGNOSIS — F329 Major depressive disorder, single episode, unspecified: Secondary | ICD-10-CM | POA: Diagnosis not present

## 2015-06-26 DIAGNOSIS — Z1389 Encounter for screening for other disorder: Secondary | ICD-10-CM | POA: Diagnosis not present

## 2015-06-26 DIAGNOSIS — Z6829 Body mass index (BMI) 29.0-29.9, adult: Secondary | ICD-10-CM | POA: Diagnosis not present

## 2015-06-26 DIAGNOSIS — E039 Hypothyroidism, unspecified: Secondary | ICD-10-CM | POA: Diagnosis not present

## 2015-06-26 DIAGNOSIS — Z23 Encounter for immunization: Secondary | ICD-10-CM | POA: Diagnosis not present

## 2015-06-26 DIAGNOSIS — E1139 Type 2 diabetes mellitus with other diabetic ophthalmic complication: Secondary | ICD-10-CM | POA: Diagnosis not present

## 2015-06-26 DIAGNOSIS — I10 Essential (primary) hypertension: Secondary | ICD-10-CM | POA: Diagnosis not present

## 2015-06-28 ENCOUNTER — Ambulatory Visit: Payer: Self-pay | Admitting: *Deleted

## 2015-07-15 NOTE — Progress Notes (Signed)
This encounter was created in error - please disregard.

## 2015-07-16 ENCOUNTER — Encounter: Payer: PPO | Admitting: Cardiology

## 2015-07-30 DIAGNOSIS — E039 Hypothyroidism, unspecified: Secondary | ICD-10-CM | POA: Diagnosis not present

## 2015-07-30 DIAGNOSIS — I10 Essential (primary) hypertension: Secondary | ICD-10-CM | POA: Diagnosis not present

## 2015-07-30 DIAGNOSIS — G4700 Insomnia, unspecified: Secondary | ICD-10-CM | POA: Diagnosis not present

## 2015-07-30 DIAGNOSIS — I252 Old myocardial infarction: Secondary | ICD-10-CM | POA: Diagnosis not present

## 2015-07-30 DIAGNOSIS — F341 Dysthymic disorder: Secondary | ICD-10-CM | POA: Diagnosis not present

## 2015-07-30 DIAGNOSIS — Z794 Long term (current) use of insulin: Secondary | ICD-10-CM | POA: Diagnosis not present

## 2015-07-30 DIAGNOSIS — E1142 Type 2 diabetes mellitus with diabetic polyneuropathy: Secondary | ICD-10-CM | POA: Diagnosis not present

## 2015-07-30 DIAGNOSIS — Z79899 Other long term (current) drug therapy: Secondary | ICD-10-CM | POA: Diagnosis not present

## 2015-07-30 DIAGNOSIS — I2510 Atherosclerotic heart disease of native coronary artery without angina pectoris: Secondary | ICD-10-CM | POA: Diagnosis not present

## 2015-07-30 DIAGNOSIS — R200 Anesthesia of skin: Secondary | ICD-10-CM | POA: Diagnosis not present

## 2015-07-30 DIAGNOSIS — I739 Peripheral vascular disease, unspecified: Secondary | ICD-10-CM | POA: Diagnosis not present

## 2015-07-30 DIAGNOSIS — H538 Other visual disturbances: Secondary | ICD-10-CM | POA: Diagnosis not present

## 2015-08-03 ENCOUNTER — Encounter: Payer: PPO | Admitting: Cardiology

## 2015-08-03 ENCOUNTER — Other Ambulatory Visit: Payer: Self-pay

## 2015-08-03 NOTE — Progress Notes (Signed)
  Review of Systems  Constitution: Negative.  HENT: Negative.   Eyes: Negative.   Cardiovascular: Negative.   Respiratory: Negative.   Skin: Negative.   Musculoskeletal: Negative.   Gastrointestinal: Negative.   Genitourinary: Negative.   Neurological: Negative.   Psychiatric/Behavioral: Negative.      This encounter was created in error - please disregard.

## 2015-08-07 ENCOUNTER — Other Ambulatory Visit: Payer: Self-pay | Admitting: *Deleted

## 2015-08-07 DIAGNOSIS — E1310 Other specified diabetes mellitus with ketoacidosis without coma: Secondary | ICD-10-CM

## 2015-08-07 NOTE — Patient Outreach (Signed)
[place] [facility]) Care Management  [place]  08/07/2015   Alyssa [name] 05-25-1935 [id number]  Subjective: RN Health Coach telephone call to patient.  Hipaa compliance verified. Per patient she does not know what her blood sugar is. Per patient her and her husband have gotten new glucometer's and they can't get them to work. RN Health Coach asked patient is she taking her lantus and patient is. Per patient it has been a long time since she and her husband have checked their blood sugars. Patient doesn't know what her A1C is. She stated it has been a long time since it was checked. Patient is not in an exercise program. Per patient stated she runs a low blood pressure. Patient took blood pressure while RN on phone and stated it was 88/61. Per patient it always runs low. Patient stated she is not having any dizziness or weakness.  Per patient she is also experiencing an urgency to go to the bathroom when she stands up. Patient stated that she is always thirsty.  Patient needed reiterating the signs and symptoms of high and low blood sugar. RN explained to patient that she needs to call her physician and make an appointment . Patient and husband have agreed for a community nurse to come to their home.    Objective:   Encounter Medications:  Outpatient Encounter Prescriptions as of 08/07/2015  Medication Sig Note  . amitriptyline (ELAVIL) 10 MG tablet Take 10 mg by mouth at bedtime as needed for sleep.    [name] aspirin 81 MG tablet Take 81 mg by mouth daily.   [name] atorvastatin (LIPITOR) 80 MG tablet Take 1 tablet (80 mg total) by mouth daily at 6 PM. (Patient not taking: Reported on 06/04/2015) 06/04/2015: Per patient she hadn't gotten refilled  . carvedilol (COREG) 3.125 MG tablet Take 3.125 mg by mouth 2 (two) times daily with a meal.  04/02/2014: .  [name] Coenzyme Q10 (COQ10 PO) Take 1 capsule by mouth daily. 04/01/2014: Pt does not know strength  . diclofenac sodium (VOLTAREN) 1 % GEL  Apply 2 g topically daily as needed (pain). Reported on 06/04/2015   . doxycycline (VIBRA-TABS) 100 MG tablet Take 1 tablet (100 mg total) by mouth 2 (two) times daily. (Patient not taking: Reported on 06/04/2015)   . furosemide (LASIX) 20 MG tablet Take 20 mg by mouth daily.  04/02/2014: .  . gabapentin (NEURONTIN) 600 MG tablet Take 600 mg by mouth 3 (three) times daily. Reported on 06/04/2015 06/04/2015: Per patient she stopped taking  . glimepiride (AMARYL) 4 MG tablet Take 4 mg by mouth daily with breakfast.  04/02/2014: .  [name] HYDROcodone-acetaminophen (NORCO/VICODIN) 5-325 MG per tablet Take 1 tablet by mouth every 6 (six) hours as needed for moderate pain. Reported on 06/04/2015 11/13/2014: Received from: External Pharmacy  . insulin glargine (LANTUS) 100 UNIT/ML injection Inject 10 Units into the skin at bedtime. 01/01/2015: Received from: [place]: Inject into the skin.  [name] levothyroxine (SYNTHROID, LEVOTHROID) 125 MCG tablet Take 125 mcg by mouth daily before breakfast.  09/07/2014: Received from: [facility]  . levothyroxine (SYNTHROID, LEVOTHROID) 125 MCG tablet Take 125 mcg by mouth daily. 08/03/2015: Received from: External Pharmacy Received Sig:   . losartan (COZAAR) 50 MG tablet Take 1 tablet (50 mg total) by mouth daily.   . metFORMIN (GLUCOPHAGE) 500 MG tablet Take 1 tablet (500 mg total) by mouth 2 (two) times daily with a meal.   . naproxen (  NAPROSYN) 500 MG tablet Take 500 mg by mouth 2 (two) times daily. Reported on 06/04/2015 11/13/2014: Received from: External Pharmacy  . nitroGLYCERIN (NITROSTAT) 0.4 MG SL tablet Place 0.4 mg under the tongue every 5 (five) minutes as needed for chest pain.  04/02/2014: .  . polyethylene glycol (MIRALAX / GLYCOLAX) packet Take 17 g by mouth daily as needed (constipation).    [name] spironolactone (ALDACTONE) 25 MG tablet Take 12.5 mg by mouth daily.  04/02/2014: .  . ticagrelor (BRILINTA) 90 MG TABS tablet Take 1 tablet (90 mg total) by  mouth 2 (two) times daily. (Patient not taking: Reported on 06/04/2015)   . traMADol (ULTRAM) 50 MG tablet Reported on 06/04/2015 11/13/2014: Received from: External Pharmacy  . ZETIA 10 MG tablet TAKE 1 TABLET BY MOUTH EVERY DAY    No facility-administered encounter medications on file as of 08/07/2015.    Functional Status:  In your present state of health, do you have any difficulty performing the following activities: 08/07/2015 05/31/2015  Hearing? N N  Vision? N N  Difficulty concentrating or making decisions? N N  Walking or climbing stairs? Y Y  Dressing or bathing? N N  Doing errands, shopping? N N  Preparing Food and eating ? N N  Using the Toilet? N N  In the past six months, have you accidently leaked urine? Y N  Do you have problems with loss of bowel control? N N  Managing your Medications? N N  Managing your Finances? N N  Housekeeping or managing your Housekeeping? N N    Fall/Depression Screening: PHQ 2/9 Scores 08/07/2015 05/31/2015  PHQ - 2 Score 1 1   THN CM Care Plan Problem One        Most Recent Value   Care Plan Problem One  Knowledge  deficit in self management of diabetes   Role Documenting the Problem One  [place] for Problem One  Active   THN Long Term Goal (31-90 days)  A1C of  8.3 drop by 1 point within the next 90 days   THN Long Term Goal Start Date  08/07/15   Interventions for Problem One Long Term Goal  Discussed with patient A1C goals. RN sent EMMI information on why get your A1C checked. RN discussed with patient to make an appointment with PCP   [facility] CM Short Term Goal #1 (0-30 days)  Patient will be able to verbalize why check blood sugars as per Dr orders   [facility] CM Short Term Goal #1 Start Date  08/07/15 [continue. Patient understands blood sugars need to be checked but didn't follow through when unable to get meters to work]   Interventions for Short Term Goal #1  RN discussed with patient about checking blood sugars and taking  Diabetes medication. RN sent EMMI on When to check blood sugars and Why check your blood sugar. RN will send educational information on complictions of high blood sugars   THN CM Short Term Goal #2 (0-30 days)  Patient will be able to verbalize signs and symptoms of hypo and hyperglycemia in 30 days   THN CM Short Term Goal #2 Start Date  08/07/15 [not met. Patient states she feels thirsty all the time and is having an urgency to go to the bathroom. but without checking her blood sugars she doesn't know if it is high or low]   Interventions for Short Term Goal #2  Rn discussed signs and symptoms of hypo and hyperglycemia.  RN sent  EMMI information on signs and symptoms. RN sent a picture chart also of symptoms. RN will continue to follow up with discussion and teachback       Assessment: Patient is not checking her blood sugars Patient is having increase thirst and urgency with urination Patient does not having a working glucometer Patient is reporting low blood pressures Patient needs an appointment with PCP   Plan:  RN is referring patient to a community nurse  RN advised patient to call for an appointment with PCP RN will send patient educational material on Why high blood glucose is a problem RN will send EMMI information on How Diabetes can affect your body.  [facility] Care Management [phone]

## 2015-08-08 ENCOUNTER — Encounter: Payer: Self-pay | Admitting: *Deleted

## 2015-08-08 ENCOUNTER — Encounter: Payer: Self-pay | Admitting: Cardiology

## 2015-08-10 DIAGNOSIS — H2512 Age-related nuclear cataract, left eye: Secondary | ICD-10-CM | POA: Diagnosis not present

## 2015-08-17 ENCOUNTER — Other Ambulatory Visit: Payer: Self-pay | Admitting: *Deleted

## 2015-08-17 NOTE — Patient Outreach (Signed)
[place] [facility]) Care Management  08/17/2015  DUA DISHON 23-Oct-1935 [id number]   Telephone assessment  Referral received from health coach for education on Blood sugar meter, Placed call to Alyssa [name], HIPAA verified, explained purpose of the call and [facility] community care manager role.   Alyssa [name] reports that she has a meter and is able to stick her finger get blood on the strip but the meter is not reading. I discussed with patient that I would like to schedule a time to come to her home to assist her with the meter, patient states not right now I have a few other appointments in the next few weeks. Patient states you will have to call me again next week.   Plan Care Manager will contact patient within one week regarding scheduling home visit for cbg meter education.  Alyssa Draft, RN, [place] Management [phone]- Mobile [phone]- Toll Free Main Office

## 2015-08-20 ENCOUNTER — Other Ambulatory Visit: Payer: Self-pay | Admitting: *Deleted

## 2015-08-20 NOTE — Patient Outreach (Signed)
[place] [facility]) Care Management  08/20/2015  Alyssa [name] 02/24/1936 [id number]  2nd Attempt for telephone assessment. RN placed call to [name], HIPAA compliance verified.  [name] states that she does not have time to talk now, call at a another time.  Plan RNCM will contact patient at another time as she has requested.    [name], RN, [place] Management [phone]- Mobile [phone]- Toll Free Main Office

## 2015-08-30 ENCOUNTER — Other Ambulatory Visit: Payer: Self-pay | Admitting: *Deleted

## 2015-08-30 NOTE — Patient Outreach (Signed)
[place] [facility]) Care Management  08/30/2015  Alyssa [name] 1935-12-31 [id number]   In the home of Alyssa [name] while visiting her husband for a scheduled visit.  Alyssa [name] has agreed for telephone contact on next week to begin assessment, she reports being too upset this week to discuss anything , due to the death of her dog.  Plan Plan telephone outreach on next week.Alyssa Draft, RN, [place] Management [phone]- Mobile [phone]- [place] Office

## 2015-09-07 ENCOUNTER — Other Ambulatory Visit: Payer: Self-pay | Admitting: *Deleted

## 2015-09-07 NOTE — Patient Outreach (Signed)
[place] [facility]) Care Management  09/07/2015  Alyssa [name] Nov 12, 1935 [id number]   Telephone assessment  1220 Telephone call to Alyssa [name] regarding , [facility] community care management for assessment and scheduling home visit to assist her with her Diabetes, blood sugar monitoring. Alyssa [name] states she does not feel like talking right now requested that I call her later today.  Plan RN will place call to patient later today.  1500 Returned call to patient she is unable to talk at this time requested that I call back next week.  Plan Will place call to patient within next week.  [name], RN, [place] Management [phone]- Mobile [phone]- Toll Free Main Office

## 2015-09-14 ENCOUNTER — Other Ambulatory Visit: Payer: Self-pay | Admitting: *Deleted

## 2015-09-14 NOTE — Patient Outreach (Signed)
[place] [facility]) Care Management  09/14/2015  MAYSUN WHEELUS [birth year] [id number]   Scheduled Telephone call to Mrs.Constancio,as agreed to from last week. Patient reports that she has too much going on today to talk, she agreed to home visit on next week.   Plan RN will schedule initial home visit for next week.   [name], RN, [place] Management [phone]- Mobile [phone]- Toll Free Main Office

## 2015-09-20 ENCOUNTER — Other Ambulatory Visit: Payer: Self-pay | Admitting: *Deleted

## 2015-09-20 ENCOUNTER — Encounter: Payer: Self-pay | Admitting: *Deleted

## 2015-09-20 VITALS — BP 130/70 | HR 75 | Resp 18

## 2015-09-20 DIAGNOSIS — Z794 Long term (current) use of insulin: Secondary | ICD-10-CM

## 2015-09-20 DIAGNOSIS — Z79899 Other long term (current) drug therapy: Secondary | ICD-10-CM

## 2015-09-20 DIAGNOSIS — I214 Non-ST elevation (NSTEMI) myocardial infarction: Secondary | ICD-10-CM

## 2015-09-20 DIAGNOSIS — I5032 Chronic diastolic (congestive) heart failure: Secondary | ICD-10-CM

## 2015-09-20 DIAGNOSIS — E118 Type 2 diabetes mellitus with unspecified complications: Secondary | ICD-10-CM

## 2015-09-20 NOTE — Patient Outreach (Signed)
Alyssa [name]) Care Management   09/20/2015  Alyssa [name] 28-May-1935 [id number]  Alyssa [name] is an 80 y.o. female  Subjective:  [name] discussed her chronic conditions of Diabetes, history of MI with stents and Heart Failure. Alyssa [name] shared that she has been depressed lately and not taking good care of herself, she discussed being down related to the loss of her dog that they had for many years. Patient discussed how she does not get out of the house to do anything fun or paint as she used to.  Patient discussed she uses cane when she goes outside the home, but gets around all right in the home.  Alyssa [name] reports she takes her insulin every night, but has not being checking her blood sugar partly because she states the instructions that came with the meter where in spanish only.  Patient discussed cost of some of her medication Brilinta $80 co pay, Zetia and Lantus, patient states she is not taking Brilinta l. Alyssa [name] also discussed other medication that she has but does not always take on a regular basis, with gabapentin she does not sleep, Wellbutrin not working and amitriptyline makes her sleepy the next day.  Alyssa [name] discussed she has an upcoming appointment with Dr.Turner, cardiologist on 6/12.   Objective:  BP 130/70 mmHg  Pulse 75  Resp 18  SpO2 97%  1:30 Patient sitting at table on arrival, having her first meal of the day, and taking her morning medication Review of Systems  Constitutional: Negative.   HENT: Negative.   Eyes:       Reports decreased vision  Respiratory: Negative.   Cardiovascular: Negative.   Genitourinary: Negative.   Musculoskeletal: Negative.   Skin: Negative.   Neurological: Negative.   Psychiatric/Behavioral: Positive for depression. Negative for suicidal ideas.       Forgetful    Physical Exam  Constitutional: She is oriented to person, place, and time. She appears well-developed and well-nourished.   Cardiovascular: Normal rate, normal heart sounds and intact distal pulses.   Respiratory: Effort normal.  GI: Soft.  Musculoskeletal: Normal range of motion.  Neurological: She is alert and oriented to person, place, and time.  Skin: Skin is warm and dry.  Psychiatric: Her speech is normal. Judgment and thought content normal. Cognition and memory are normal. She exhibits a depressed mood.  Reports being forgetful    Encounter Medications:   Outpatient Encounter Prescriptions as of 09/20/2015  Medication Sig Note  . aspirin 81 MG tablet Take 81 mg by mouth daily.   [name] buPROPion (WELLBUTRIN XL) 150 MG 24 hr tablet Take 150 mg by mouth daily.   . carvedilol (COREG) 3.125 MG tablet Take 3.125 mg by mouth 2 (two) times daily with a meal.  04/02/2014: .  . furosemide (LASIX) 20 MG tablet Take 20 mg by mouth daily.  04/02/2014: .  [name] HYDROcodone-acetaminophen (NORCO/VICODIN) 5-325 MG per tablet Take 1 tablet by mouth every 6 (six) hours as needed for moderate pain. Reported on 06/04/2015 11/13/2014: Received from: External Pharmacy  . insulin glargine (LANTUS) 100 UNIT/ML injection Inject 25 Units into the skin at bedtime.  01/01/2015: Received from: [place]: Inject into the skin.  [name] levothyroxine (SYNTHROID, LEVOTHROID) 125 MCG tablet Take 125 mcg by mouth daily. 08/03/2015: Received from: External Pharmacy Received Sig:   . losartan (COZAAR) 50 MG tablet Take 1 tablet (50 mg total) by mouth daily.   . metFORMIN (GLUCOPHAGE) 500 MG tablet Take 1 tablet (500  mg total) by mouth 2 (two) times daily with a meal.   . nitroGLYCERIN (NITROSTAT) 0.4 MG SL tablet Place 0.4 mg under the tongue every 5 (five) minutes as needed for chest pain.  04/02/2014: .  . polyethylene glycol (MIRALAX / GLYCOLAX) packet Take 17 g by mouth daily as needed (constipation).    [name] ZETIA 10 MG tablet TAKE 1 TABLET BY MOUTH EVERY DAY   . amitriptyline (ELAVIL) 10 MG tablet Take 10 mg by mouth at bedtime as needed  for sleep. Reported on 09/20/2015   . atorvastatin (LIPITOR) 80 MG tablet Take 1 tablet (80 mg total) by mouth daily at 6 PM. (Patient not taking: Reported on 06/04/2015) 06/04/2015: Per patient she hadn't gotten refilled  . Coenzyme Q10 (COQ10 PO) Take 1 capsule by mouth daily. Reported on 09/20/2015 04/01/2014: Pt does not know strength  . diclofenac sodium (VOLTAREN) 1 % GEL Apply 2 g topically daily as needed (pain). Reported on 09/20/2015   . doxycycline (VIBRA-TABS) 100 MG tablet Take 1 tablet (100 mg total) by mouth 2 (two) times daily. (Patient not taking: Reported on 06/04/2015)   . gabapentin (NEURONTIN) 600 MG tablet Take 600 mg by mouth 3 (three) times daily. Reported on 09/20/2015 06/04/2015: Per patient she stopped taking  . glimepiride (AMARYL) 4 MG tablet Take 4 mg by mouth daily with breakfast. Reported on 09/20/2015 04/02/2014: .  . levothyroxine (SYNTHROID, LEVOTHROID) 125 MCG tablet Take 125 mcg by mouth daily before breakfast.  09/07/2014: Received from: [facility]  . naproxen (NAPROSYN) 500 MG tablet Take 500 mg by mouth 2 (two) times daily. Reported on 09/20/2015 11/13/2014: Received from: External Pharmacy  . spironolactone (ALDACTONE) 25 MG tablet Take 12.5 mg by mouth daily. Reported on 09/20/2015 04/02/2014: .  . ticagrelor (BRILINTA) 90 MG TABS tablet Take 1 tablet (90 mg total) by mouth 2 (two) times daily. (Patient not taking: Reported on 06/04/2015)   . traMADol (ULTRAM) 50 MG tablet Reported on 09/20/2015 11/13/2014: Received from: External Pharmacy   No facility-administered encounter medications on file as of 09/20/2015.    Functional Status:   In your present state of health, do you have any difficulty performing the following activities: 09/20/2015 08/07/2015  Hearing? N N  Vision? Y N  Difficulty concentrating or making decisions? N N  Walking or climbing stairs? Y Y  Dressing or bathing? N N  Doing errands, shopping? N N  Preparing Food and eating ? N N  Using the Toilet? N N   In the past six months, have you accidently leaked urine? Y Y  Do you have problems with loss of bowel control? N N  Managing your Medications? - N  Managing your Finances? N N  Housekeeping or managing your Housekeeping? Y N   Fall Risk  09/20/2015 08/07/2015 05/31/2015  Falls in the past year? No No No  Risk for fall due to : Impaired balance/gait - -  Risk for fall due to (comments): occasionlly uses a cane - -   Fall/Depression Screening:    PHQ 2/9 Scores 09/20/2015 08/07/2015 05/31/2015  PHQ - 2 Score 5 1 1   PHQ- 9 Score 8 - -  Exception Documentation (No Data) - -    Assessment:  Initial home visit, patient ambulating in home independently without problems .  Diabetes : patient will benefit from education on diabetes self care management, assist with making sure she has all of the needed supplies for checking blood sugar. Blood sugar checked during visit  approximately 1 hour after eating , reading 308, patient understands this is a high reading and discussed that she wants to work on getting her blood sugar down.  Medication- Mrs. Daffron is not consistent with taking some  medications on her list , and has concern regarding cost of some. Patient will benefit from pharmacy medication review and assistance.   Depression- Patient will benefit from social worker consult for resources for counseling and for community resources for being more active outside of the home per request.     Plan:  RN placed a call to [place] regarding strips for the meter express call that patient has , meter was supplied by [employer] at no cost, and strips will be at no cost patient updated. Will place [place] consult for medication review and cost for concern. Will place [facility] LCSW consult for counseling and community resources RN will continue education and management of Diabetes  with patient centered goals. Will send MD barrier/involvement letter.  THN CM Care Plan Problem One         Most Recent Value   Care Plan Problem One  Diabetes self care management   Role Documenting the Problem One  Care Management Coordinator   Care Plan for Problem One  Active   THN Long Term Goal (31-90 days)  Patient will report increase knowledge of Diabetes self care management in the next 90 days   THN Long Term Goal Start Date  09/20/15   Interventions for Problem One Long Term Goal  Discussed with patient importance of keeping blood sugar under control, Provided and reviewed EMMI handout on Diabetes : taking care of self day.    THN CM Short Term Goal #1 (0-30 days)  Patient will be able to demonstrate checking blood sugar daily and recording in the next 30 days   THN CM Short Term Goal #1 Start Date  09/20/15   Interventions for Short Term Goal #1  RN educated on how and why its important to check blood sugar,provided and reviewed EMMI handout on checking blood sugar, educate/allow patient to demonstrate checking blood sugar, teachback    THN CM Short Term Goal #2 (0-30 days)  Patient will begin walking at least 10 minutes in home at least 3 days a week in the next 30 days   THN CM Short Term Goal #2 Start Date  09/20/15   Interventions for Short Term Goal #2  Discussed with patient how exercise helps to lower blood glucose   THN CM Short Term Goal #3 (0-30 days)  Patient will get strips from the pharmacy in the next 7 days   THN CM Short Term Goal #3 Start Date  09/20/15   Interventions for Short [place]ern Goal #3  RN placed call to [place] regarding patient needing refill on strips for meter,informed patient no cost for strips per pharmacy     [name], RN, [place] Management [phone]- Mobile [phone]- [place]

## 2015-09-24 ENCOUNTER — Encounter: Payer: Self-pay | Admitting: Cardiology

## 2015-09-24 ENCOUNTER — Ambulatory Visit (INDEPENDENT_AMBULATORY_CARE_PROVIDER_SITE_OTHER): Payer: PPO | Admitting: Cardiology

## 2015-09-24 ENCOUNTER — Other Ambulatory Visit: Payer: Self-pay | Admitting: Pharmacist

## 2015-09-24 VITALS — BP 132/62 | HR 77 | Ht 61.0 in | Wt 158.2 lb

## 2015-09-24 DIAGNOSIS — I2583 Coronary atherosclerosis due to lipid rich plaque: Principal | ICD-10-CM

## 2015-09-24 DIAGNOSIS — E785 Hyperlipidemia, unspecified: Secondary | ICD-10-CM

## 2015-09-24 DIAGNOSIS — I2510 Atherosclerotic heart disease of native coronary artery without angina pectoris: Secondary | ICD-10-CM | POA: Diagnosis not present

## 2015-09-24 DIAGNOSIS — I5032 Chronic diastolic (congestive) heart failure: Secondary | ICD-10-CM

## 2015-09-24 DIAGNOSIS — I10 Essential (primary) hypertension: Secondary | ICD-10-CM | POA: Diagnosis not present

## 2015-09-24 LAB — BASIC METABOLIC PANEL
BUN: 11 mg/dL (ref 7–25)
CHLORIDE: 105 mmol/L (ref 98–110)
CO2: 27 mmol/L (ref 20–31)
CREATININE: 0.66 mg/dL (ref 0.60–0.93)
Calcium: 9.1 mg/dL (ref 8.6–10.4)
Glucose, Bld: 221 mg/dL — ABNORMAL HIGH (ref 65–99)
Potassium: 4.2 mmol/L (ref 3.5–5.3)
Sodium: 140 mmol/L (ref 135–146)

## 2015-09-24 LAB — HEPATIC FUNCTION PANEL
ALT: 15 U/L (ref 6–29)
AST: 14 U/L (ref 10–35)
Albumin: 4.2 g/dL (ref 3.6–5.1)
Alkaline Phosphatase: 87 U/L (ref 33–130)
BILIRUBIN INDIRECT: 0.4 mg/dL (ref 0.2–1.2)
Bilirubin, Direct: 0.1 mg/dL (ref <=0.2)
TOTAL PROTEIN: 6.3 g/dL (ref 6.1–8.1)
Total Bilirubin: 0.5 mg/dL (ref 0.2–1.2)

## 2015-09-24 LAB — LIPID PANEL
Cholesterol: 130 mg/dL (ref 125–200)
HDL: 43 mg/dL — ABNORMAL LOW (ref >=46)
LDL CALC: 47 mg/dL (ref <130)
TRIGLYCERIDES: 202 mg/dL — AB (ref <150)
Total CHOL/HDL Ratio: 3 Ratio (ref <=5.0)
VLDL: 40 mg/dL — ABNORMAL HIGH (ref <30)

## 2015-09-24 NOTE — Patient Instructions (Signed)
Medication Instructions:  Your physician recommends that you continue on your current medications as directed. Please refer to the Current Medication list given to you today.   Labwork: TODAY: BMET, LFTs, Lipids  Testing/Procedures: None  Follow-Up: Your physician wants you to follow-up in: 6 months with Dr. Turner. You will receive a reminder letter in the mail two months in advance. If you don't receive a letter, please call our office to schedule the follow-up appointment.   Any Other Special Instructions Will Be Listed Below (If Applicable).     If you need a refill on your cardiac medications before your next appointment, please call your pharmacy.   

## 2015-09-24 NOTE — Patient Outreach (Signed)
[place] [facility]) Care Management  09/24/2015  PAYZLEY KEYS October 16, 1935 [id number]   [place] received referral from Maudie Mercury Foreman regarding patient concerns for medication cost.    Attempted to reach patient and there was no answer on phone.    Unable to leave a voicemail.    Plan:  Will make a second attempt to reach patient within the next week.    Karrie Meres, PharmD, [place] [phone]

## 2015-09-24 NOTE — Progress Notes (Signed)
Cardiology Office Note    Date:  09/24/2015   ID:  Alyssa [name], DOB [birth year], MRN [id number]  PCP:  Leonides Sake, MD  Cardiologist:  Fransico Him, MD   Chief Complaint  Patient presents with  . Coronary Artery Disease  . Hypertension  . Congestive Heart Failure    History of Present Illness:  Alyssa [name] is a 80 y.o. female with history of CAD (DES to LAD 2013, NSTEMI s/p DES to OM1 and DES to RCA 03/2014) , HTN, chronic diastolic CHF, GERD, hypothyroidism, DM and PVD who presents for followup.  She occasionally has some LE edema that is sporadic. She denies any anginal chest pain, SOB, DOE, palptiations.  She rarely has some dizziness that she describes as a swimmy headed feeling but has not had any syncope.    Past Medical History  Diagnosis Date  . Hypothyroid   . HLD (hyperlipidemia)   . CAD (coronary artery disease)     a. 2013 NSTEMI s/p DES to LAD. b. 04/03/14: NSTEMI s/p DES to OM1, DES to dRCA.  [name] HTN (hypertension)   . Chronic diastolic CHF (congestive heart failure) ([place])     a. 2D ECHO 04/02/14 with hypokinesis in the basal inferior and inferoseptal walls. Focal and moderate concentric LVH. Overall LVEF 60-65%. G1DD. Elevated EDLV filling pressures. Mild TR.  [name] GERD (gastroesophageal reflux disease)   . Refusal of blood transfusions as patient is Jehovah's Witness 09-29-12  . Type II diabetes mellitus ([place])   . Peripheral vascular disease ([place])   . History of GI bleed     a. 2013: adm for ABL anemia. 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  . Anemia   . Hypertensive heart disease     Past Surgical History  Procedure Laterality Date  . Tonsillectomy    . Esophagogastroduodenoscopy  02/26/2012    Procedure: ESOPHAGOGASTRODUODENOSCOPY (EGD);  Surgeon: Lear Ng, MD;  Location: [facility] ENDOSCOPY;  Service: Endoscopy;  Laterality: N/A;  . Colonoscopy   02/27/2012    Procedure: COLONOSCOPY;  Surgeon: Lear Ng, MD;  Location: [facility] ENDOSCOPY;  Service: Endoscopy;  Laterality: N/A;  . Cesarean section  ~ 1961; 1966    plus 3 NVD  . Left heart catheterization with coronary angiogram N/A 06/12/2011    Procedure: LEFT HEART CATHETERIZATION WITH CORONARY ANGIOGRAM;  Surgeon: Josue Hector, MD;  Location: [facility] CATH LAB;  Service: Cardiovascular;  Laterality: N/A;  . Coronary angioplasty with stent placement  05/2011; 04/03/2014  . Vaginal hysterectomy    . Left heart catheterization with coronary angiogram N/A 04/03/2014    Procedure: LEFT HEART CATHETERIZATION WITH CORONARY ANGIOGRAM;  Surgeon: Jettie Booze, MD;  Location: [facility] CATH LAB;  Service: Cardiovascular;  Laterality: N/A;    Current Medications: Outpatient Prescriptions Prior to Visit  Medication Sig Dispense Refill  . amitriptyline (ELAVIL) 10 MG tablet Take 10 mg by mouth at bedtime as needed for sleep. Reported on 09/20/2015    . aspirin 81 MG tablet Take 81 mg by mouth daily.    [name] atorvastatin (LIPITOR) 80 MG tablet Take 1 tablet (80 mg total) by mouth daily at 6 PM. 30 tablet 11  . buPROPion (WELLBUTRIN XL) 150 MG 24 hr tablet Take 150 mg by mouth daily.    . carvedilol (COREG) 3.125 MG tablet Take 3.125 mg by mouth 2 (two) times daily with a meal.     .  Coenzyme Q10 (COQ10 PO) Take 1 capsule by mouth daily. Reported on 09/20/2015    . diclofenac sodium (VOLTAREN) 1 % GEL Apply 2 g topically daily as needed (pain). Reported on 09/20/2015    . furosemide (LASIX) 20 MG tablet Take 20 mg by mouth daily.     [name] gabapentin (NEURONTIN) 600 MG tablet Take 600 mg by mouth 3 (three) times daily. Reported on 09/20/2015  5  . glimepiride (AMARYL) 4 MG tablet Take 4 mg by mouth daily with breakfast. Reported on 09/20/2015    . HOMEOPATHIC PRODUCTS PO Take 1 tablet by mouth as needed. hylands leg cramps takes as needed    . insulin glargine (LANTUS) 100 UNIT/ML injection Inject 25 Units into  the skin at bedtime.     [name] levothyroxine (SYNTHROID, LEVOTHROID) 125 MCG tablet Take 125 mcg by mouth daily.    [name] losartan (COZAAR) 50 MG tablet Take 1 tablet (50 mg total) by mouth daily. 90 tablet 3  . metFORMIN (GLUCOPHAGE) 500 MG tablet Take 1 tablet (500 mg total) by mouth 2 (two) times daily with a meal. 60 tablet 11  . nitroGLYCERIN (NITROSTAT) 0.4 MG SL tablet Place 0.4 mg under the tongue every 5 (five) minutes as needed for chest pain.     . polyethylene glycol (MIRALAX / GLYCOLAX) packet Take 17 g by mouth daily as needed (constipation).     [name] spironolactone (ALDACTONE) 25 MG tablet Take 12.5 mg by mouth daily. Reported on 09/20/2015    . ZETIA 10 MG tablet TAKE 1 TABLET BY MOUTH EVERY DAY 30 tablet 6  . levothyroxine (SYNTHROID, LEVOTHROID) 125 MCG tablet Take 125 mcg by mouth daily before breakfast.     . doxycycline (VIBRA-TABS) 100 MG tablet Take 1 tablet (100 mg total) by mouth 2 (two) times daily. (Patient not taking: Reported on 09/24/2015) 20 tablet 0  . HYDROcodone-acetaminophen (NORCO/VICODIN) 5-325 MG per tablet Take 1 tablet by mouth every 6 (six) hours as needed for moderate pain. Reported on 09/24/2015  0  . naproxen (NAPROSYN) 500 MG tablet Take 500 mg by mouth 2 (two) times daily. Reported on 09/24/2015  0  . ticagrelor (BRILINTA) 90 MG TABS tablet Take 1 tablet (90 mg total) by mouth 2 (two) times daily. (Patient not taking: Reported on 09/24/2015) 60 tablet 11  . traMADol (ULTRAM) 50 MG tablet Reported on 09/24/2015  0   No facility-administered medications prior to visit.     Allergies:   Tetanus toxoid, adsorbed and Tetanus toxoids   Social History   Social History  . Marital Status: Married    Spouse Name: N/A  . Number of Children: N/A  . Years of Education: N/A   Social History Main Topics  . Smoking status: Former Smoker -- 1.50 packs/day for 16 years    Types: Cigarettes    Quit date: 03/15/1970  . Smokeless tobacco: Never Used  . Alcohol Use: 1.8 - 2.4  oz/week    3-4 Standard drinks or equivalent per week     Comment: 04/03/2014 "glass of wine or a beer 2-3 times/month"  . Drug Use: No  . Sexual Activity: Not Currently   Other Topics Concern  . None   Social History Narrative     Family History:  The patient's family history includes Arthritis in her father; Heart disease in her mother; Varicose Veins in her mother.   ROS:   Please see the history of present illness.    ROS All other systems reviewed and  are negative.   PHYSICAL EXAM:   VS:  BP 132/62 mmHg  Pulse 77  Ht 5\' 1"  (1.549 m)  Wt 158 lb 3.2 oz (71.759 kg)  BMI 29.91 kg/m2   GEN: Well nourished, well developed, in no acute distress HEENT: normal Neck: no JVD, carotid bruits, or masses Cardiac: RRR; no murmurs, rubs, or gallops.  Trace edema.  Intact distal pulses bilaterally.  Respiratory:  clear to auscultation bilaterally, normal work of breathing GI: soft, nontender, nondistended, + BS MS: no deformity or atrophy Skin: warm and dry, no rash Neuro:  Alert and Oriented x 3, Strength and sensation are intact Psych: euthymic mood, full affect  Wt Readings from Last 3 Encounters:  09/24/15 158 lb 3.2 oz (71.759 kg)  01/26/15 161 lb 12.8 oz (73.392 kg)  01/04/15 163 lb (73.936 kg)      Studies/Labs Reviewed:   EKG:  EKG is ordered today showing NSR at 77bpm with septal infarct and no ST changes  Recent Labs: No results found for requested labs within last 365 days.   Lipid Panel    Component Value Date/Time   CHOL 174 06/02/2014 1203   TRIG 276.0* 06/02/2014 1203   HDL 36.40* 06/02/2014 1203   CHOLHDL 5 06/02/2014 1203   VLDL 55.2* 06/02/2014 1203   LDLDIRECT 99.0 06/02/2014 1203    Additional studies/ records that were reviewed today include:  none    ASSESSMENT:    1. Coronary artery disease due to lipid rich plaque   2. Chronic diastolic CHF (congestive heart failure) ([place])   3. HLD (hyperlipidemia)   4. Essential hypertension       PLAN:  In order of problems listed above:  1. ASCAD s/p remote NSTEMI (DES to LAD 2013, NSTEMI s/p DES to OM1 and DES to RCA 03/2014) with no angina recently.  Continue ASA/statin/BB 2. Chronic diastolic CHF - she appears euvolemic on exam today.  Continue BB/ARB and diuretic.  Check BMET. 3. Hyperlipidemia with LDL goal < 70.  Check FLP and ALT. Continue high dose atorvastatin and Zetia. 4.    HTN - Bp controlled on current medical regimen.  Continue BB/aldactone and ARB.   Medication Adjustments/Labs and Tests Ordered: Current medicines are reviewed at length with the patient today.  Concerns regarding medicines are outlined above.  Medication changes, Labs and Tests ordered today are listed in the Patient Instructions below.  There are no Patient Instructions on file for this visit.   Signed, Fransico Him, MD  09/24/2015 11:11 AM    [address], [place], [place]  [address] Phone: [phone]; Fax: [phone]

## 2015-09-25 ENCOUNTER — Other Ambulatory Visit: Payer: Self-pay | Admitting: *Deleted

## 2015-09-25 NOTE — Patient Outreach (Signed)
[facility]) Care Management  09/25/2015  Alyssa [name] [birth year] [id number]  CSW was able to make initial contact with patient today to arrange for visit to complete assessment, as well as assess and assist with social work needs and services.  CSW introduced self, explained role and types of services provided through [place] [facility] ([place] [facility]).  CSW further explained to patient that CSW works with patient's RNCM, also with [place] [facility], . CSW then explained the reason for the call, indicating that patient's RNCM thought that patient would benefit from social work services and resources to assist with .  CSW obtained two HIPAA compliant identifiers from patient, which included patient's name and date of birth. Visit planned for 10/01/15.   Eduard Clos, MSW, [place] Worker  [place] [phone]

## 2015-09-25 NOTE — Patient Outreach (Signed)
[facility]) Care Management  09/25/2015  Alyssa [name] 30-Sep-1935 [id number]  Telephone follow assessment call  RN placed follow up call to Alyssa [name], to determine if she had been able to pick up her blood sugar meter strips from pharmacy, she confirmed that she had. Alyssa [name] states at first she had trouble placing blood drop on strip but she has it figured out now.  Alyssa [name] discussed her recent visit to cardiology office and states she got a good report and now she has to get her blood sugar under control.  Plan RN will schedule visit with patient in the 3 weeks, continue Diabetes education and care management. Alyssa [name] will begin to check her blood sugars daily and keep a record.  THN CM Care Plan Problem One        Most Recent Value   Care Plan Problem One  Diabetes self care management   Role Documenting the Problem One  Care Management Coordinator   Care Plan for Problem One  Active   THN Long Term Goal (31-90 days)  Patient will report increase knowledge of Diabetes self care management in the next 90 days   THN Long Term Goal Start Date  09/20/15   Interventions for Problem One Long Term Goal  Reinforced with patient importance of keeping blood sugar under control, Provided and reviewed EMMI handout on Diabetes : taking care of self day.    THN CM Short Term Goal #1 (0-30 days)  Patient will be able to demonstrate checking blood sugar daily and recording in the next 30 days   THN CM Short Term Goal #1 Start Date  09/20/15   Interventions for Short Term Goal #1  Reinforced with patient checking blood sugar is one step toward managing Diabetes   THN CM Short Term Goal #2 (0-30 days)  Patient will begin walking at least 10 minutes in home at least 3 days a week in the next 30 days   THN CM Short Term Goal #2 Start Date  09/20/15   Interventions for Short Term Goal #2  Discussed with patient how exercise helps to lower blood glucose   THN CM Short Term Goal #3  (0-30 days)  Patient will get strips from the pharmacy in the next 7 days   [facility] CM Short Term Goal #3 Start Date  09/20/15   [facility] CM Short Term Goal #3 Met Date  09/25/15     [name], RN, [place] [facility] [phone]- Mobile [phone]- [place]

## 2015-09-27 ENCOUNTER — Other Ambulatory Visit: Payer: Self-pay | Admitting: Pharmacist

## 2015-09-27 NOTE — Patient Outreach (Signed)
[facility]) Care Management  09/27/2015  ENAYA WEYLAND 01/09/1936 [id number]  Made second attempt to reach patient to discuss medication cost concerns with her.  Patient answered and verified name/date of birth.  Explained purpose of call and patient asked for a call back at another time since she was busy at the moment.   Plan:  Scheduled to call patient back tomorrow afternoon.    Karrie Meres, PharmD, [place] [phone]

## 2015-09-28 ENCOUNTER — Other Ambulatory Visit: Payer: Self-pay | Admitting: Pharmacist

## 2015-09-28 NOTE — Patient Outreach (Signed)
[facility]) Care Management  09/28/2015  Alyssa [name] 1935-06-24 [id number]  Phone call to patient as scheduled today, patient answered and verified name/date of birth.  Explained to patient that she may be able to apply to manufacturer patient assistance programs for brand name medications like Zetia and insulin, patient isn't sure if she has met the out-of-pocket spend requirement for insulin manufacturer assistance programs.    She states that she is not taking Brilinta, and hasn't taken it in over 5 months secondary to cost.  It is no longer on her medication list and she doesn't remember if Dr Radford Pax or another provider stopped it.   Plan:  Home visit scheduled with patient to review medications and patient assistance program applications.    Will inbasket Dr Radford Pax to see if patient is supposed to be on an anti-platelet agent such as Brilinta    Karrie Meres, PharmD, [place] [phone]

## 2015-10-01 ENCOUNTER — Other Ambulatory Visit: Payer: Self-pay | Admitting: *Deleted

## 2015-10-01 NOTE — Patient Outreach (Signed)
[facility]) Care Management  [facility] Social Work  10/01/2015  Alyssa [name] 1935-08-16 [id number]  Subjective:  Patient referred to CSW for depression and community resources.   Encounter Medications:  Outpatient Encounter Prescriptions as of 10/01/2015  Medication Sig Note  . amitriptyline (ELAVIL) 10 MG tablet Take 10 mg by mouth at bedtime as needed for sleep. Reported on 09/20/2015   . aspirin 81 MG tablet Take 81 mg by mouth daily.   [name] atorvastatin (LIPITOR) 80 MG tablet Take 1 tablet (80 mg total) by mouth daily at 6 PM. 06/04/2015: Per patient she hadn't gotten refilled  . buPROPion (WELLBUTRIN XL) 150 MG 24 hr tablet Take 150 mg by mouth daily.   . carvedilol (COREG) 3.125 MG tablet Take 3.125 mg by mouth 2 (two) times daily with a meal.  04/02/2014: .  [name] Coenzyme Q10 (COQ10 PO) Take 1 capsule by mouth daily. Reported on 09/20/2015 04/01/2014: Pt does not know strength  . diclofenac sodium (VOLTAREN) 1 % GEL Apply 2 g topically daily as needed (pain). Reported on 09/20/2015   . furosemide (LASIX) 20 MG tablet Take 20 mg by mouth daily.  04/02/2014: .  . gabapentin (NEURONTIN) 600 MG tablet Take 600 mg by mouth 3 (three) times daily. Reported on 09/20/2015 06/04/2015: Per patient she stopped taking  . glimepiride (AMARYL) 4 MG tablet Take 4 mg by mouth daily with breakfast. Reported on 09/20/2015 04/02/2014: .  [name] HOMEOPATHIC PRODUCTS PO Take 1 tablet by mouth as needed. hylands leg cramps takes as needed   . insulin glargine (LANTUS) 100 UNIT/ML injection Inject 25 Units into the skin at bedtime.  01/01/2015: Received from: [place]: Inject into the skin.  [name] levothyroxine (SYNTHROID, LEVOTHROID) 125 MCG tablet Take 125 mcg by mouth daily before breakfast.  09/07/2014: Received from: [facility]  . levothyroxine (SYNTHROID, LEVOTHROID) 125 MCG tablet Take 125 mcg by mouth daily. 08/03/2015: Received from: External Pharmacy Received Sig:   . losartan (COZAAR)  50 MG tablet Take 1 tablet (50 mg total) by mouth daily.   . metFORMIN (GLUCOPHAGE) 500 MG tablet Take 1 tablet (500 mg total) by mouth 2 (two) times daily with a meal.   . nitroGLYCERIN (NITROSTAT) 0.4 MG SL tablet Place 0.4 mg under the tongue every 5 (five) minutes as needed for chest pain.  04/02/2014: .  . polyethylene glycol (MIRALAX / GLYCOLAX) packet Take 17 g by mouth daily as needed (constipation).    [name] spironolactone (ALDACTONE) 25 MG tablet Take 12.5 mg by mouth daily. Reported on 09/20/2015 04/02/2014: .  [name] ZETIA 10 MG tablet TAKE 1 TABLET BY MOUTH EVERY DAY    No facility-administered encounter medications on file as of 10/01/2015.    Functional Status:  In your present state of health, do you have any difficulty performing the following activities: 09/20/2015 08/07/2015  Hearing? N N  Vision? Y N  Difficulty concentrating or making decisions? N N  Walking or climbing stairs? Y Y  Dressing or bathing? N N  Doing errands, shopping? N N  Preparing Food and eating ? N N  Using the Toilet? N N  In the past six months, have you accidently leaked urine? Y Y  Do you have problems with loss of bowel control? N N  Managing your Medications? - N  Managing your Finances? N N  Housekeeping or managing your Housekeeping? [name]    Fall/Depression Screening:  [facility] 2/9 Scores 10/01/2015 09/20/2015 08/07/2015 05/31/2015  PHQ - 2 Score 6 5 1 1   PHQ- 9 Score 14 8 - -  Exception Documentation - (No Data) - -    Assessment:  CSW met with patient in her home today to complete CSW assessment. Patient acknowledges sign/symptoms related to depression- states her PCP prescribed Wellbutrin 122m but only too it one day "because it made me feel hyper".  CSW completed the PHQ9 depression screening and found her to be moderately depressed.  Patient shared a lot of past "family dynamics" that have lead to her feeling depressed with low self value.  Patient is encouraged to speak with her PCP (she plans to call  today) about the RX for Wellbutrin 1531mand ask PCP to consider a different dose or medication. CSW will also make [facility] aware for follow up later this week.  CSW also will make [facility] that patient is concerned about the cost of her Zetia to see if there is any assistance or generic drug available.   Plan:  CSW to communicate with PCP about the Wellbutrin; patient also plans to ask about this. CSW will assist patient with outpatient mental health services as she is agreeable to seeking counseling for depression, past family dynamics and  Grief/bereavement.      THN CM Care Plan Problem One        Most Recent Value   Care Plan Problem One  -- [Signs/symptoms of depression]   Role Documenting the Problem One  Clinical Social Worker   Care Plan for Problem One  Active   THN Long Term Goal (31-90 days)  Patient will be seen by a counselor/therapist within the next 90 days.   THN Long Term Goal Start Date  10/01/15   Interventions for Problem One Long Term Goal  CSW discusswd signs and symptoms related to depressoin as well as her PHQ9 score. CSW to assist patient with getting mentla health outpatient links for therapy.    THN CM Short Term Goal #1 (0-30 days)  Patient will recieve information on depression and review within the next 30 days.    THN CM Short Term Goal #1 Start Date  10/01/15   Interventions for Short Term Goal #1  CSW to send EMMI and arrange for outpatient appointment for mental health/depression counseling.    THN CM Short Term Goal #2 (0-30 days)  Patient will discuss anit-depresanst with PCP within the next 30 days.   THN CM Short Term Goal #2 Start Date  10/01/15   Interventions for Short Term Goal #2  CSW discussed RX for anti-depresant may be beneficial (at lower dose than prescribed).        JaEduard ClosMSW, LCMoscoworker  [phone]

## 2015-10-03 ENCOUNTER — Other Ambulatory Visit: Payer: Self-pay | Admitting: Pharmacist

## 2015-10-03 NOTE — Patient Outreach (Signed)
[facility]) [place]   10/03/2015  Alyssa [name] 15-Jun-1935 [id number]  Subjective:  Patient was referred to [place] [facility] for medication reconciliation and medication patient assistance by Onalee Hua, [place].  Home visit completed with patient at patient place of residence on 10/03/15.    Patient states she does not use a pill planner and isn't interested in using one at this time.  She states that she is not taking bupropion because it made her anxious---she reports she has not discussed this with her PCP.    She states she was not able to afford Brilinta and stopped taking it, she also states Zetia is expensive for her.   Objective:   Encounter Medications: Outpatient Encounter Prescriptions as of 10/03/2015  Medication Sig Note  . amitriptyline (ELAVIL) 10 MG tablet Take 10 mg by mouth at bedtime as needed for sleep. Reported on 09/20/2015   . aspirin 81 MG tablet Take 81 mg by mouth daily.   [name] atorvastatin (LIPITOR) 80 MG tablet Take 1 tablet (80 mg total) by mouth daily at 6 PM. 06/04/2015: Per patient she hadn't gotten refilled  . buPROPion (WELLBUTRIN XL) 150 MG 24 hr tablet Take 150 mg by mouth daily.   . carvedilol (COREG) 3.125 MG tablet Take 3.125 mg by mouth 2 (two) times daily with a meal.  04/02/2014: .  [name] Coenzyme Q10 (COQ10 PO) Take 1 capsule by mouth daily. Reported on 09/20/2015 04/01/2014: Pt does not know strength  . diclofenac sodium (VOLTAREN) 1 % GEL Apply 2 g topically daily as needed (pain). Reported on 09/20/2015   . furosemide (LASIX) 20 MG tablet Take 20 mg by mouth daily.  04/02/2014: .  . gabapentin (NEURONTIN) 600 MG tablet Take 600 mg by mouth 3 (three) times daily. Reported on 09/20/2015 06/04/2015: Per patient she stopped taking  . glimepiride (AMARYL) 4 MG tablet Take 4 mg by mouth daily with breakfast. Reported on 09/20/2015 04/02/2014: .  [name] HOMEOPATHIC PRODUCTS PO Take 1 tablet by mouth as needed.  hylands leg cramps takes as needed   . insulin glargine (LANTUS) 100 UNIT/ML injection Inject 25 Units into the skin at bedtime.  01/01/2015: Received from: [place]: Inject into the skin.  [name] levothyroxine (SYNTHROID, LEVOTHROID) 125 MCG tablet Take 125 mcg by mouth daily before breakfast.  09/07/2014: Received from: [facility]  . levothyroxine (SYNTHROID, LEVOTHROID) 125 MCG tablet Take 125 mcg by mouth daily. 08/03/2015: Received from: External Pharmacy Received Sig:   . losartan (COZAAR) 50 MG tablet Take 1 tablet (50 mg total) by mouth daily.   . metFORMIN (GLUCOPHAGE) 500 MG tablet Take 1 tablet (500 mg total) by mouth 2 (two) times daily with a meal.   . nitroGLYCERIN (NITROSTAT) 0.4 MG SL tablet Place 0.4 mg under the tongue every 5 (five) minutes as needed for chest pain.  04/02/2014: .  . polyethylene glycol (MIRALAX / GLYCOLAX) packet Take 17 g by mouth daily as needed (constipation).    [name] spironolactone (ALDACTONE) 25 MG tablet Take 12.5 mg by mouth daily. Reported on 09/20/2015 04/02/2014: .  [name] ZETIA 10 MG tablet TAKE 1 TABLET BY MOUTH EVERY DAY    No facility-administered encounter medications on file as of 10/03/2015.    Functional Status: In your present state of health, do you have any difficulty performing the following activities: 09/20/2015 08/07/2015  Hearing? N N  Vision? Y N  Difficulty concentrating or making  decisions? N N  Walking or climbing stairs? Y Y  Dressing or bathing? N N  Doing errands, shopping? N N  Preparing Food and eating ? N N  Using the Toilet? N N  In the past six months, have you accidently leaked urine? Y Y  Do you have problems with loss of bowel control? N N  Managing your Medications? - N  Managing your Finances? N N  Housekeeping or managing your Housekeeping? Y N    Fall/Depression Screening: PHQ 2/9 Scores 10/01/2015 09/20/2015 08/07/2015 05/31/2015  PHQ - 2 Score 6 5 1 1   PHQ- 9 Score 14 8 - -  Exception Documentation -  (No Data) - -    Assessment:  Drugs sorted by system:  Cardiovascular: -Aspirin -Atorvastatin -carvedilol -ezetimibe (zetia) -furosemide -losartan -nitroglycerin sublingual as needed  Gastrointestinal: -Miralax as needed  Endocrine: -levothyroxine -insulin glargine (Lantus)  -metformin  Topical: -diclofenac sodium gel  Vitamins/Minerals: -coenzyme Q10  Other issues noted:  The following medications are on her medication list and she reports no longer taking them: -amitriptyline (states made her too drowsy)--would also recommend avoiding in the elderly due to anti-cholinergic side effects and increased risk of falls -gabapentin  -glimepiride  -bupropion (states made her anxious)  -spironolactone   Brilinta was removed from patient's active medication list at patient's last cardiology appointment on 09/24/15, and patient reports not taking it in over 5 months due to cost.  [facility] Pharmacist in-basket messaged Dr Radford Pax regarding this and Dr Radford Pax had replied she would prefer patient on clopidogrel if she is able to afford.    Zetia:  Patient completed patient portion of [employer] patient assistance application.  Explained to patient that application will need to be completed by prescriber, mailed to [employer] and she will likely then receive an appeals form from [employer] to complete and mail back.    It is covered on her MA-PDP as a Tier 3 medication ($45 copay/30 day supply)  There is likely some non-adherence based on last refill dates of atorvastatin, losartan, carvedilol, and metformin.  Counseled patient on importance of taking her medications as prescribed by her providers.   Plan:  Will message Dr Radford Pax regarding clopidogrel as patient reports she would be able to afford it better than Brilinta.   Will get provider portion of [employer] patient assistance application to Dr Theodosia Blender office for completion.    Will follow-up with patient via phone in about two weeks.    Will  route initial encounter and barriers letter to patient's PCP.  Karrie Meres, PharmD, [place] [phone]

## 2015-10-04 ENCOUNTER — Encounter: Payer: Self-pay | Admitting: Pharmacist

## 2015-10-09 DIAGNOSIS — E782 Mixed hyperlipidemia: Secondary | ICD-10-CM | POA: Diagnosis not present

## 2015-10-09 DIAGNOSIS — R609 Edema, unspecified: Secondary | ICD-10-CM | POA: Diagnosis not present

## 2015-10-09 DIAGNOSIS — F329 Major depressive disorder, single episode, unspecified: Secondary | ICD-10-CM | POA: Diagnosis not present

## 2015-10-09 DIAGNOSIS — R51 Headache: Secondary | ICD-10-CM | POA: Diagnosis not present

## 2015-10-09 DIAGNOSIS — E1139 Type 2 diabetes mellitus with other diabetic ophthalmic complication: Secondary | ICD-10-CM | POA: Diagnosis not present

## 2015-10-09 DIAGNOSIS — R220 Localized swelling, mass and lump, head: Secondary | ICD-10-CM | POA: Diagnosis not present

## 2015-10-10 ENCOUNTER — Telehealth: Payer: Self-pay

## 2015-10-10 MED ORDER — CLOPIDOGREL BISULFATE 75 MG PO TABS: 75.0000 mg | ORAL_TABLET | Freq: Every day | ORAL | Status: DC

## 2015-10-10 NOTE — Telephone Encounter (Signed)
-----   Message from Sueanne Margarita, MD sent at 10/10/2015 11:52 AM EDT ----- Regarding: RE: medication question Patient stopped Brilinta due to cost - please order Plavix 75mg  daily  Fransico Him ----- Message -----    From: Lin Givens, RPH    Sent: 10/08/2015   8:27 AM      To: Sueanne Margarita, MD Subject: RE: medication question                        [facility] isn't able to prescribe medications, can your RN send in the prescription?  Patient uses [facility] in [place], [place].   Lennette Bihari  ----- Message -----    From: Sueanne Margarita, MD    Sent: 10/05/2015   4:43 PM      To: Drexel Iha Ruedinger, RPH Subject: RE: medication question                        Please orer Plavix 75mg  daily with her ASA  Tressia Miners TUrner ----- Message -----    From: Lin Givens, RPH    Sent: 10/04/2015  11:03 AM      To: Sueanne Margarita, MD Subject: RE: medication question                        Dr Radford Pax:  I saw Ms Hellberg yesterday and she is willing to try clopidogrel, it should be much less expensive for her, if you need her on additional anti-platelet therapy besides aspirin.    She is presently taking aspirin 81 mg.    Thanks for you help.   Lennette Bihari  ----- Message -----    From: Sueanne Margarita, MD    Sent: 09/28/2015   4:02 PM      To: Drexel Iha Ruedinger, RPH Subject: RE: medication question                        I would prefer that she be on Plavix 75mg  daily if she can  Traci ----- Message -----    From: Lin Givens, RPH    Sent: 09/28/2015   2:40 PM      To: Sueanne Margarita, MD Subject: medication question                            Hi Dr Radford Pax:  I am one of the [facility] Pharmacists and am going to be seeing patient for medication review and patient assistance.    She was previously taking Brilinta, but reports not taking in the past 5 months due to cost.  She saw you on 09/24/15 and Brilinta is no longer on her medication list.    Does she need to be on anti-platelet  therapy besides Aspirin?   Thanks for your time  Karrie Meres, PharmD, [place] [phone]

## 2015-10-10 NOTE — Telephone Encounter (Signed)
Med list updated - Brilinta d/c'd, Plavix 75 mg daily ordered to [facility]

## 2015-10-11 ENCOUNTER — Other Ambulatory Visit: Payer: Self-pay | Admitting: *Deleted

## 2015-10-11 NOTE — Patient Outreach (Signed)
Alyssa [name]) Care Management   10/11/2015  Alyssa [name] 1936/02/27 [id number]  Alyssa [name] is an 80 y.o. female  Subjective:  Patient reports doing some better on today, but discussed she is still grieving the loss of her dog, and recent adjustment in medications after MD visit. Patient states she has not being taking as good of herself,has only checked blood sugar once since last visit. Alyssa [name] discussed her upcoming cataract surgery Alyssa [name] reports states she is taking her medications, including insulin , she discussed her recent office visits with Alyssa [name] and Alyssa [name]. Patient discussed cost of Zetia and Alyssa [name] pharmacist is working with her on getting some help with this.   Objective:  BP 140/80 mmHg  Pulse 57  Resp 18  SpO2 97% Review of Systems  Constitutional: Negative.   Eyes:       Decreased vision , awaiting cataract surgery  Respiratory: Negative.   Cardiovascular: Negative.   Genitourinary: Negative.   Musculoskeletal: Negative.   Skin: Negative.   Neurological: Negative.   Psychiatric/Behavioral: Positive for depression.    Physical Exam  Constitutional: She is oriented to person, place, and time. She appears well-developed and well-nourished.  Cardiovascular: Normal rate, normal heart sounds and intact distal pulses.   Respiratory: Effort normal.  GI: Soft.  Musculoskeletal: Normal range of motion.  Neurological: She is alert and oriented to person, place, and time.  Skin: Skin is warm and dry.  Psychiatric: She has a normal mood and affect. Her behavior is normal. Judgment and thought content normal.    Encounter Medications:   Outpatient Encounter Prescriptions as of 10/11/2015  Medication Sig Note  . amitriptyline (ELAVIL) 10 MG tablet Take 10 mg by mouth at bedtime as needed for sleep. Reported on 10/04/2015   . aspirin 81 MG tablet Take 81 mg by mouth daily.   [name] atorvastatin (LIPITOR) 80 MG tablet Take 1 tablet (80 mg total)  by mouth daily at 6 PM.   . carvedilol (COREG) 3.125 MG tablet Take 3.125 mg by mouth 2 (two) times daily with a meal.  04/02/2014: .  . furosemide (LASIX) 20 MG tablet Take 20 mg by mouth daily.  04/02/2014: .  [name] HOMEOPATHIC PRODUCTS PO Take 1 tablet by mouth as needed. hylands leg cramps takes as needed   . insulin glargine (LANTUS) 100 UNIT/ML injection Inject 25 Units into the skin at bedtime.    [name] levothyroxine (SYNTHROID, LEVOTHROID) 125 MCG tablet Take 125 mcg by mouth daily.   [name] losartan (COZAAR) 50 MG tablet Take 1 tablet (50 mg total) by mouth daily.   . meloxicam (MOBIC) 15 MG tablet Take 15 mg by mouth daily. Take for 2 weeks beginning 6/27 for forehead pain   . metFORMIN (GLUCOPHAGE) 500 MG tablet Take 1 tablet (500 mg total) by mouth 2 (two) times daily with a meal.   . nitroGLYCERIN (NITROSTAT) 0.4 MG SL tablet Place 0.4 mg under the tongue every 5 (five) minutes as needed for chest pain. Reported on 10/04/2015 04/02/2014: .  . polyethylene glycol (MIRALAX / GLYCOLAX) packet Take 17 Alyssa by mouth daily as needed (constipation). Reported on 10/04/2015   . spironolactone (ALDACTONE) 25 MG tablet Take 12.5 mg by mouth daily. Reported on 10/04/2015 04/02/2014: .  [name] ZETIA 10 MG tablet TAKE 1 TABLET BY MOUTH EVERY DAY   . buPROPion (WELLBUTRIN XL) 150 MG 24 hr tablet Take 150 mg by mouth daily. Per patient report MD states to take a half a pill   .  clopidogrel (PLAVIX) 75 MG tablet Take 1 tablet (75 mg total) by mouth daily. (Patient not taking: Reported on 10/11/2015)   . Coenzyme Q10 (COQ10 PO) Take 1 capsule by mouth daily. Reported on 10/11/2015   . diclofenac sodium (VOLTAREN) 1 % GEL Apply 2 Alyssa topically daily as needed (pain). Reported on 10/11/2015   . gabapentin (NEURONTIN) 600 MG tablet Take 600 mg by mouth 3 (three) times daily. Reported on 10/11/2015   . glimepiride (AMARYL) 4 MG tablet Take 4 mg by mouth daily with breakfast. Reported on 10/11/2015 04/02/2014: .  . levothyroxine  (SYNTHROID, LEVOTHROID) 125 MCG tablet Take 125 mcg by mouth daily before breakfast.     No facility-administered encounter medications on file as of 10/11/2015.    Functional Status:   In your present state of health, do you have any difficulty performing the following activities: 09/20/2015 08/07/2015  Hearing? N N  Vision? Y N  Difficulty concentrating or making decisions? N N  Walking or climbing stairs? Y Y  Dressing or bathing? N N  Doing errands, shopping? N N  Preparing Food and eating ? N N  Using the Toilet? N N  In the past six months, have you accidently leaked urine? Y Y  Do you have problems with loss of bowel control? N N  Managing your Medications? - N  Managing your Finances? N N  Housekeeping or managing your Housekeeping? Y N    Fall/Depression Screening:    PHQ 2/9 Scores 10/01/2015 09/20/2015 08/07/2015 05/31/2015  PHQ - 2 Score _0 PHQ- 9 Score 14 8 - -  Exception Documentation - (No Data) - -    Assessment:  Routine home visit, Alyssa [name] present, patient kitchen table cluttered with paper, patient keeps her medication in a plastic container.   Depression - patient has discussed that she has started back with painting one her hobbies.  Diabetes/Blood sugar monitoring- has all of supplies available for checking blood sugars, reviewed need to monitor blood sugar to monitor control, denies hypoglycemia symptoms. Patient states she  Understands to watch carbohydrate intake she limits sweets, choosing fresh fruit in limits. Completed PCP and Cardiology follow up Medications- Patient has new prescription for Plavix at pharmacy that she will pick up. Placed call to Alyssa [name] pharmacist Alyssa [name]  to discuss patient starting on Plavix and upcoming eye surgery, recommendations to educate patient.   Patient has upcoming cataract surgery - reviewed importance of hand washing.  Educate  patient to notify eye surgeon that she has been started on plavix, to determine  if/when she needs to stop medication prior to surgery, stress  the importance of this.   Plan:  Reviewed and provided  EMMI handout on Controlling Diabetes, and encouraged to monitor to blood sugar Will schedule follow up telephone visit in 2 weeks to follow up on blood sugar monitoring, and home visit in a month. Updated patient centered goals and care planning.  [facility] CM Care Plan Problem One        [name] Recent Value   Care Plan Problem One  Diabetes self care management   Role Documenting the Problem One  Care Management Coordinator   Care Plan for Problem One  Active   [facility] Long Term Goal (31-90 days)  Patient will report increase knowledge of Diabetes self care management in the next 90 days   [facility] Long Term Goal Start Date  09/20/15   Interventions for Problem One Long Term Goal  Reviewed and provided  EMMI handout on controlling Diabetes.   [facility] CM Short Term Goal #1 (0-30 days)  Patient will be able to demonstrate checking blood sugar daily and recording in the next 30 days   [facility] CM Short Term Goal #1 Start Date  09/20/15   Interventions for Short Term Goal #1  Reviewed importance of checking blood sugar, as a strategy to control diabetes , patient checked blood sugar during visit good technique   [facility] CM Short Term Goal #2 (0-30 days)  Patient will begin walking at least 10 minutes in home at least 3 days a week in the next 30 days   [facility] CM Short Term Goal #2 Start Date  09/20/15   [facility] CM Short Term Goal #2 Met Date  10/11/15   [facility] CM Short Term Goal #3 (0-30 days)  Patient will get strips from the pharmacy in the next 7 days   [facility] CM Short Term Goal #3 Start Date  09/20/15   [name] CM Short Term Goal #3 Met Date  09/25/15   [facility] CM Short Term Goal #4 (0-30 days)  Patient will to monitor blood pressure and record at least 2 times weekly in the next 30 days    [facility] CM Short Term Goal #4 Start Date  10/11/15   Interventions for Short Term Goal #4  Educated patient on importance of blood pressue  control with diabetes to prevent further problems,and to notify MD of blood pressure readings outside her normal readings or if symptoms.     Alyssa Draft, RN, [place] Management [phone]- Mobile [phone]- Toll Free Main Office

## 2015-10-18 ENCOUNTER — Other Ambulatory Visit: Payer: Self-pay | Admitting: Pharmacist

## 2015-10-18 NOTE — Patient Outreach (Signed)
Attempted to reach patient today for two week follow-up phone call about her medications.  There was no answer on the phone and the voicemail was full so no message was able to be left.    Plan:  Will make another attempt to reach patient next week.   Karrie Meres, PharmD, [place] [phone]

## 2015-10-22 ENCOUNTER — Ambulatory Visit: Payer: Self-pay | Admitting: *Deleted

## 2015-10-23 ENCOUNTER — Encounter: Admission: RE | Payer: Self-pay | Source: Ambulatory Visit

## 2015-10-23 ENCOUNTER — Ambulatory Visit: Admission: RE | Admit: 2015-10-23 | Payer: PPO | Source: Ambulatory Visit | Admitting: Ophthalmology

## 2015-10-23 DIAGNOSIS — Z6827 Body mass index (BMI) 27.0-27.9, adult: Secondary | ICD-10-CM | POA: Diagnosis not present

## 2015-10-23 DIAGNOSIS — L299 Pruritus, unspecified: Secondary | ICD-10-CM | POA: Diagnosis not present

## 2015-10-23 SURGERY — PHACOEMULSIFICATION, CATARACT, WITH IOL INSERTION
Anesthesia: Choice | Laterality: Left

## 2015-10-25 ENCOUNTER — Other Ambulatory Visit: Payer: Self-pay | Admitting: *Deleted

## 2015-10-25 NOTE — Patient Outreach (Signed)
[facility]) Care Management  10/25/2015  RONNAE OSMOND 1935/07/10 [id number]   Placed scheduled telephone call to Mrs.Negin Cassata, her husband Kealoha Hurtgen answered the phone , HIPAA verified.  He states Elgin Laines is out of town.  Plan Will meet patient in home for scheduled home visit within the next week.  [name], RN, [place] Management [phone]- Mobile [phone]- Toll Free Main Office

## 2015-10-26 ENCOUNTER — Other Ambulatory Visit: Payer: Self-pay | Admitting: *Deleted

## 2015-10-26 NOTE — Patient Outreach (Signed)
[facility] Care Management  10/26/2015  Alyssa [name] [birth year] [id number]   CSW attempted to reach patient today for follow up and to plan visit-  No answer and voicemail available- will plan to call again next week.   Eduard Clos, MSW, [place] Worker  [place] [phone]

## 2015-10-29 ENCOUNTER — Other Ambulatory Visit: Payer: Self-pay | Admitting: Pharmacist

## 2015-10-29 NOTE — Patient Outreach (Signed)
Second unsuccessful attempt to reach patient via phone to follow-up medication adherence and update patient that [employer] patient assistance application for Zetia was taken to her cardiologist office for completion.     No answer on phone and was unable to leave a voicemail.  Plan:  Will make a third attempt to reach patient within the next week.   Karrie Meres, PharmD, [place] [phone]

## 2015-10-30 ENCOUNTER — Other Ambulatory Visit: Payer: Self-pay | Admitting: *Deleted

## 2015-10-30 NOTE — Patient Outreach (Signed)
[facility]) Care Management  10/30/2015  OTTIS CRITCHER 14-Oct-1935 [id number]   CSW attempted to reach patient today by phone- HIPPA compliant message left- will try again tomorrow if no return call.   Eduard Clos, MSW, [place] Worker  [place] [phone]

## 2015-10-31 ENCOUNTER — Ambulatory Visit: Payer: Self-pay | Admitting: *Deleted

## 2015-11-01 ENCOUNTER — Other Ambulatory Visit: Payer: Self-pay | Admitting: *Deleted

## 2015-11-01 NOTE — Patient Outreach (Signed)
[facility]) Care Management   11/01/2015  Alyssa [name] 05/13/1935 [id number]  Alyssa [name] is an 80 y.o. 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 Alyssa [name] states she cancelled her scheduled cataract surgery stating that she is seeing much better, discussed getting books from the Library and starting to read more now. Patient has not started to check her blood sugar on a regular schedule. Alyssa [name] discussed she is still grieving a little over the death of her dog in the last few months. Patient states she is not taking her wellbutrin , amitriptyline or Neurontin because they don't help her sleep, and make her feel talkative or sleepier during the day,she states she has discussed this with Dr.Hamrick. Patient states she still wants to work toward taking better care of herself.  Objective:  BP 120/60 mmHg  Pulse 76  Resp 18  SpO2 98%  Patient ambulating independently during visit. Review of Systems  Constitutional: Negative.   HENT: Negative.   Eyes: Negative.   Respiratory: Negative.   Cardiovascular: Negative.   Gastrointestinal: Negative.   Genitourinary: Negative.   Musculoskeletal: Negative.   Skin: Negative.   Neurological: Negative.   Endo/Heme/Allergies: Negative.   Psychiatric/Behavioral: Positive for depression. Negative for suicidal ideas.    Physical Exam  Constitutional: She is oriented to person, place, and time. She appears well-developed and well-nourished.  Cardiovascular: Normal rate, normal heart sounds and intact distal pulses.   Respiratory: Effort normal.  GI: Soft.  Neurological: She is alert and oriented to person, place, and time.  Skin: Skin is warm and dry.  Psychiatric: She has a normal mood and affect. Her behavior is normal. Judgment and thought content normal.    Encounter Medications:   Outpatient Encounter Prescriptions as of 11/01/2015   Medication Sig Note  . amitriptyline (ELAVIL) 10 MG tablet Take 10 mg by mouth at bedtime as needed for sleep. Reported on 10/04/2015 11/01/2015: Does not take on a regular basis  . aspirin 81 MG tablet Take 81 mg by mouth daily.   [name] atorvastatin (LIPITOR) 80 MG tablet Take 1 tablet (80 mg total) by mouth daily at 6 PM.   . carvedilol (COREG) 3.125 MG tablet Take 3.125 mg by mouth 2 (two) times daily with a meal.  04/02/2014: .  . clopidogrel (PLAVIX) 75 MG tablet Take 1 tablet (75 mg total) by mouth daily.   . Coenzyme Q10 (COQ10 PO) Take 1 capsule by mouth daily. Reported on 10/11/2015   . furosemide (LASIX) 20 MG tablet Take 20 mg by mouth daily.  04/02/2014: .  [name] HOMEOPATHIC PRODUCTS PO Take 1 tablet by mouth as needed. hylands leg cramps takes as needed   . insulin glargine (LANTUS) 100 UNIT/ML injection Inject 25 Units into the skin at bedtime.    [name] levothyroxine (SYNTHROID, LEVOTHROID) 125 MCG tablet Take 125 mcg by mouth daily.   [name] losartan (COZAAR) 50 MG tablet Take 1 tablet (50 mg total) by mouth daily.   . metFORMIN (GLUCOPHAGE) 500 MG tablet Take 1 tablet (500 mg total) by mouth 2 (two) times daily with a meal.   . nitroGLYCERIN (NITROSTAT) 0.4 MG SL tablet Place 0.4 mg under the tongue every 5 (five) minutes as needed for chest pain. Reported on 10/04/2015 04/02/2014: .  . polyethylene glycol (MIRALAX / GLYCOLAX) packet Take 17 g by mouth daily as needed (constipation). Reported on 10/04/2015   . spironolactone (ALDACTONE) 25 MG tablet  Take 12.5 mg by mouth daily. Reported on 10/04/2015 04/02/2014: .  [name] ZETIA 10 MG tablet TAKE 1 TABLET BY MOUTH EVERY DAY   . buPROPion (WELLBUTRIN XL) 150 MG 24 hr tablet Take 150 mg by mouth daily. Reported on 11/01/2015   . diclofenac sodium (VOLTAREN) 1 % GEL Apply 2 g topically daily as needed (pain). Reported on 11/01/2015   . gabapentin (NEURONTIN) 600 MG tablet Take 600 mg by mouth 3 (three) times daily. Reported on 11/01/2015   . glimepiride (AMARYL)  4 MG tablet Take 4 mg by mouth daily with breakfast. Reported on 11/01/2015 04/02/2014: .  . levothyroxine (SYNTHROID, LEVOTHROID) 125 MCG tablet Take 125 mcg by mouth daily before breakfast.    . meloxicam (MOBIC) 15 MG tablet Take 15 mg by mouth daily. Reported on 11/01/2015    No facility-administered encounter medications on file as of 11/01/2015.    Functional Status:   In your present state of health, do you have any difficulty performing the following activities: 09/20/2015 08/07/2015  Hearing? N N  Vision? Y N  Difficulty concentrating or making decisions? N N  Walking or climbing stairs? Y Y  Dressing or bathing? N N  Doing errands, shopping? N N  Preparing Food and eating ? N N  Using the Toilet? N N  In the past six months, have you accidently leaked urine? Y Y  Do you have problems with loss of bowel control? N N  Managing your Medications? - N  Managing your Finances? N N  Housekeeping or managing your Housekeeping? Y N    Fall/Depression Screening:    PHQ 2/9 Scores 10/01/2015 09/20/2015 08/07/2015 05/31/2015  PHQ - 2 Score 6 5 1 1   PHQ- 9 Score 14 8 - -  Exception Documentation - (No Data) - -    Assessment:  Routine home visit, home cluttered , Alyssa [name] present.  Diabetes - patient not checking blood sugar, has all supplies available no specific reason identified for not checking.  Patient demonstrated proper use of meter during visit , blood sugar 274, patient has not eaten in the last 2 hours. Reports taking insulin as prescribed. Patient denies having any symptoms of hypoglycemia, reports eating fresh vegetables and limited starches.   Alyssa [name] has an upcoming appointment with Dr.Hamrick and will discuss her concern regarding wellbutrin, amitriptyline  and Neurontin and other medication options for her depression  Plan:  Will review and update progress on patient centered goals Patient will begin to monitor blood sugar and notify MD of continued elevated blood sugars  in the 200's    [facility] CM Care Plan Problem One        Most Recent Value   Care Plan Problem One  Knowledge deficit related Diabetes self care management as evidenced by elevated blood sugar readings    Role Documenting the Problem One  Care Management [place] for Problem One  Active   THN Long Term Goal (31-90 days)  Patient will report increase knowledge of Diabetes self care management in the next 90 days   THN Long Term Goal Start Date  11/01/15 [name] unmet date restarted ]   Interventions for Problem One Long Term Goal  Reinforced importance of keeping blood sugar under control and how if benefits the body   THN CM Short Term Goal #1 (0-30 days)  Patient will be able to demonstrate checking blood sugar daily and recording in the next 30 days   THN CM Short Term Goal #  1 Start Date  11/01/15   Interventions for Short Term Goal #1  Discussed with patient her barriers to not checking her blood sugar, encouraged follow through on keeping a record and notifiying MD of elevations  and taking a record to office visit.    THN CM Short Term Goal #2 (0-30 days)  Patient will begin walking at least 10 minutes in home at least 3 days a week in the next 30 days   THN CM Short Term Goal #2 Start Date  09/20/15   [facility] CM Short Term Goal #2 Met Date  10/11/15   THN CM Short Term Goal #3 (0-30 days)  Patient will get strips from the pharmacy in the next 7 days   THN CM Short Term Goal #3 Start Date  09/20/15   [facility] CM Short Term Goal #3 Met Date  09/25/15   THN CM Short Term Goal #4 (0-30 days)  Patient will to monitor blood pressure and record at least 2 times weekly in the next 30 days    THN CM Short Term Goal #4 Start Date  10/11/15   Interventions for Short Term Goal #4  Verfied that patient able to demonstrate use of monitor.      [name], RN, [place] Management [phone]- Mobile [phone]- Toll Free Main Office

## 2015-11-05 ENCOUNTER — Ambulatory Visit: Payer: Self-pay | Admitting: Pharmacist

## 2015-11-06 ENCOUNTER — Other Ambulatory Visit: Payer: Self-pay | Admitting: *Deleted

## 2015-11-06 ENCOUNTER — Other Ambulatory Visit: Payer: Self-pay | Admitting: Pharmacist

## 2015-11-06 NOTE — Patient Outreach (Signed)
[place] [facility]) Care Management  11/06/2015  KALLA ZUPKO [birth year] [id number]   CSW attempting to contact patient by phone for follow up- her contact # is not working and her husbands' cell (backup #) has a full voice mail. CSW will try again later in the week.    Eduard Clos, MSW, [place] Worker  [place] [phone]

## 2015-11-07 NOTE — Patient Outreach (Signed)
Third attempt to contact patient to follow-up medication adherence and ask if she has heard anything from [employer] patient assistance yet.   Patient's spouse answered, on [facility] Consent, and verified HIPAA details.   He stated that it was a bad time to call patient and requested a call back later, he says this week in the morning may work better.   Plan:  Will attempt to reach patient again later within the week.   Karrie Meres, PharmD, [place] [phone]

## 2015-11-08 ENCOUNTER — Other Ambulatory Visit: Payer: Self-pay | Admitting: Pharmacist

## 2015-11-08 NOTE — Patient Outreach (Signed)
[place] [facility]) Care Management  11/08/2015  Alyssa [name] Feb 14, 1936 [id number]   Phone call to follow-up with patient today, she verified HIPAA details.  Patient reports that she is still needing to discuss her depression medications with her PCP, encouraged patient to discuss her concerns about these medications with her PCP.    Patient confirmed that she received clopidogrel prescription that cardiologist prescribed in place of Brilinta for cost savings. Patient confirmed that she is no longer taking Brilinta and she confirms that clopidogrel is more affordable for her.    Patient reports that she believes she received something in the mail from [employer] patient assistance program.  At time of call she was unable to locate it.   She denies other medication questions at this time and relates her main concern is cost of ezetimibe.    Plan:    Discussed with patient that it is important she locate what [employer] patient assistance mailed her, as she may be required to complete additional paperwork to attempt to get manufacturer patient assistance.   Will place follow-up call to patient within the next week to see if she located correspondence from [employer] patient assistance program.   Karrie Meres, PharmD, [place] [phone]

## 2015-11-09 ENCOUNTER — Other Ambulatory Visit: Payer: Self-pay | Admitting: *Deleted

## 2015-11-09 NOTE — Patient Outreach (Signed)
[name]) Care Management  11/09/2015  Alyssa [name] 1936-04-05 [id number]    CSW was able to reach patient today via her husbands cell #. Patient requests that this be the primary # used.  Patient reports her PCP  Is leaving the practice and she will have to find another PCP. She did not discuss the RX for depression with her current PCP but agrees to do so with a new PCP once finalized.  "The medcicine Dr Lisbeth Ply put me on makes me not feel right". As previously discussed with CSW, she feels "hyper" when on the RX- Wellbutrin.   CSW offered to call her PCP's office next week to get update on plans for her changing to a new PCP and  Then plan to update/discuss with patient.   Eduard Clos, MSW, [place] Worker  [place] [phone]

## 2015-11-15 ENCOUNTER — Other Ambulatory Visit: Payer: Self-pay | Admitting: *Deleted

## 2015-11-15 ENCOUNTER — Ambulatory Visit: Payer: Self-pay | Admitting: Pharmacist

## 2015-11-15 NOTE — Patient Outreach (Signed)
[name] [name]) Care Management  11/15/2015  Alyssa [name] Nov 16, 1935 [id number]  CSW spoke with patient- still no decision on gettng a new PCP.  " My daughter knows someone in [place]".  CSW offered to assist with this further if needed as well as to communicate with the new PCP once finalized.  Patient reports she had lost the application for Zetia assistance but "found the paperwork and going to send it in".   CSW will plan to call for follow up needs/visit in approx. 2 weeks.   Eduard Clos, MSW, [place] Worker  [place] [phone]

## 2015-11-16 ENCOUNTER — Other Ambulatory Visit: Payer: Self-pay | Admitting: Pharmacist

## 2015-11-16 NOTE — Patient Outreach (Signed)
[place] [facility]) Care Management  11/16/2015  Alyssa [name] 01/23/1936 [id number]  Attempted to follow-up with patient regarding her [employer] Patient Assistance Application for Zetia.  Last week she reported needing to locate the paperwork [employer] patient assistance mailed her.    There was no answer on phone, and voicemail said "memory full" so was unable to leave message.    Review of [facility] LCSW note from 11/15/15 mentions that patient was going to mail in the [employer] Patient Assistance paperwork.    Plan:   Will make another outreach to patient next week.   Karrie Meres, PharmD, [place] [phone]

## 2015-11-21 ENCOUNTER — Other Ambulatory Visit: Payer: Self-pay | Admitting: Pharmacist

## 2015-11-21 NOTE — Patient Outreach (Signed)
[place] [facility]) Care Management  11/21/2015  Alyssa [name] 26-Oct-1935 [id number]   This was a follow-up call to patient regarding [employer] patient assistance for Zetia.    Patient answered and verified HIPAA details.  She states that she filled out the appeals/exception form for [employer] patient assistance and needs to mail it back.    Patient states she is working on getting a new PCP and denies other medication questions at this time.    Plan:  Encouraged patient to complete and mail patient assistance paperwork back to [employer] patient assistance program.   Will continue to follow.    Karrie Meres, PharmD, [place] [phone]

## 2015-11-29 ENCOUNTER — Other Ambulatory Visit: Payer: Self-pay | Admitting: *Deleted

## 2015-11-29 NOTE — Patient Outreach (Signed)
[place] [facility]) Care Management  11/29/2015  Alyssa [name] Mar 09, 1936 [id number]   CSW attempted to reach patient today by phone- no answer and no voicemail. Will try again tomorrow.   Eduard Clos, MSW, [place] Worker  [place] [phone]

## 2015-12-04 ENCOUNTER — Other Ambulatory Visit: Payer: Self-pay | Admitting: Pharmacist

## 2015-12-05 ENCOUNTER — Ambulatory Visit: Payer: PPO | Admitting: *Deleted

## 2015-12-05 NOTE — Patient Outreach (Signed)
[place] [facility]) Care Management  12/05/2015  Alyssa [name] 1936/02/22 [id number]  Attempted to follow-up with patient today to see if she completed [employer] patient assistance paperwork.    No answer on phone, it rang but did not allow for a voicemail to be left.    Plan:  Will attempt to reach patient again next week.   Karrie Meres, PharmD, [place] [phone]

## 2015-12-11 ENCOUNTER — Encounter: Payer: Self-pay | Admitting: *Deleted

## 2015-12-11 ENCOUNTER — Other Ambulatory Visit: Payer: Self-pay | Admitting: Pharmacist

## 2015-12-11 ENCOUNTER — Other Ambulatory Visit: Payer: Self-pay | Admitting: *Deleted

## 2015-12-11 NOTE — Patient Outreach (Addendum)
[place] [facility]) Care Management   12/11/2015  Alyssa [name] Jun 14, 1935 [id number]  Alyssa [name] is an 80 y.o. female  Subjective:  Patient reporting that she did not sleep well on last night, waking up frequently. Alyssa [name] complaint of being sweaty during visit, states she has not checked her blood sugar in a "long time", and she hasn't taken her metformin on today. Patient also admits to not taking her insulin in about a week. Alyssa [name] states she is much better at taking care of others and not herself.   Patient discussed having a recent fall while out shopping with her husband and trying to climb up into a camper and missed a step, has a abrasion on bilateral shins. Alyssa [name] discussed her depression and how she is trying to do things to help her get her out of it, she discussed how she used to be more organized, and enjoyed painting, she also talked about how she did start to make some changes in organizing kitchen and arranging a space so that she can begin to paint. Alyssa [name] discussed that she cannot take amitriptyline each night because it makes her sleep too much the next day.   Objective:  BP 128/60   Pulse 80   Resp 18   SpO2 97%   Patient ambulating independently in home.  Review of Systems  Constitutional: Negative.   HENT: Negative.   Eyes: Negative.   Respiratory: Negative.   Cardiovascular: Negative for chest pain.  Gastrointestinal: Negative.   Genitourinary: Negative.   Skin: Negative.   Neurological: Negative.   Psychiatric/Behavioral: Positive for depression.    Physical Exam  Constitutional: She is oriented to person, place, and time. She appears well-developed and well-nourished.  Cardiovascular: Normal rate, normal heart sounds and intact distal pulses.   Respiratory: Effort normal.  GI: Soft. Bowel sounds are normal.  Neurological: She is alert and oriented to person, place, and time.  Skin: Skin is warm and dry.     Psychiatric: She  has a normal mood and affect. Her behavior is normal. Judgment and thought content normal.    Encounter Medications:   Outpatient Encounter Prescriptions as of 12/11/2015  Medication Sig Note  . aspirin 81 MG tablet Take 81 mg by mouth daily.   [name] atorvastatin (LIPITOR) 80 MG tablet Take 1 tablet (80 mg total) by mouth daily at 6 PM.   . carvedilol (COREG) 3.125 MG tablet Take 3.125 mg by mouth 2 (two) times daily with a meal.  04/02/2014: .  . clopidogrel (PLAVIX) 75 MG tablet Take 1 tablet (75 mg total) by mouth daily.   . furosemide (LASIX) 20 MG tablet Take 20 mg by mouth daily.  04/02/2014: .  . insulin glargine (LANTUS) 100 UNIT/ML injection Inject 25 Units into the skin at bedtime.    [name] levothyroxine (SYNTHROID, LEVOTHROID) 125 MCG tablet Take 125 mcg by mouth daily.   [name] losartan (COZAAR) 50 MG tablet Take 1 tablet (50 mg total) by mouth daily.   . metFORMIN (GLUCOPHAGE) 500 MG tablet Take 1 tablet (500 mg total) by mouth 2 (two) times daily with a meal.   . nitroGLYCERIN (NITROSTAT) 0.4 MG SL tablet Place 0.4 mg under the tongue every 5 (five) minutes as needed for chest pain. Reported on 10/04/2015 04/02/2014: .  . polyethylene glycol (MIRALAX / GLYCOLAX) packet Take 17 g by mouth daily as needed (constipation). Reported on 10/04/2015   . spironolactone (ALDACTONE) 25 MG tablet Take 12.5 mg by mouth daily. Reported  on 10/04/2015 04/02/2014: .  [name] ZETIA 10 MG tablet TAKE 1 TABLET BY MOUTH EVERY DAY   . amitriptyline (ELAVIL) 10 MG tablet Take 10 mg by mouth at bedtime as needed for sleep. Reported on 10/04/2015 11/01/2015: Does not take on a regular basis  . buPROPion (WELLBUTRIN XL) 150 MG 24 hr tablet Take 150 mg by mouth daily. Reported on 11/01/2015 12/11/2015: Not taking  . Coenzyme Q10 (COQ10 PO) Take 1 capsule by mouth daily. Reported on 10/11/2015   . diclofenac sodium (VOLTAREN) 1 % GEL Apply 2 g topically daily as needed (pain). Reported on 11/01/2015   . gabapentin (NEURONTIN) 600 MG  tablet Take 600 mg by mouth 3 (three) times daily. Reported on 11/01/2015   . glimepiride (AMARYL) 4 MG tablet Take 4 mg by mouth daily with breakfast. Reported on 11/01/2015 12/11/2015: Not taking  . HOMEOPATHIC PRODUCTS PO Take 1 tablet by mouth as needed. hylands leg cramps takes as needed   . levothyroxine (SYNTHROID, LEVOTHROID) 125 MCG tablet Take 125 mcg by mouth daily before breakfast.    . meloxicam (MOBIC) 15 MG tablet Take 15 mg by mouth daily. Reported on 11/01/2015    No facility-administered encounter medications on file as of 12/11/2015.     Functional Status:   In your present state of health, do you have any difficulty performing the following activities: 12/11/2015 09/20/2015  Hearing? N N  Vision? N Y  Difficulty concentrating or making decisions? Y N  Walking or climbing stairs? Y Y  Dressing or bathing? N N  Doing errands, shopping? Y N  Preparing Food and eating ? N N  Using the Toilet? N N  In the past six months, have you accidently leaked urine? N Y  Do you have problems with loss of bowel control? N N  Managing your Medications? Y -  Managing your Finances? N N  Housekeeping or managing your Housekeeping? [name]  Some recent data might be hidden    Fall/Depression Screening:    PHQ 2/9 Scores 10/01/2015 09/20/2015 08/07/2015 05/31/2015  PHQ - 2 Score _0 PHQ- 9 Score 14 8 - -  Exception Documentation - (No Data) - -    Assessment:  Co visit with Karrie Meres, [facility] pharmacist along with Mr.Colucci present.   Diabetes- Blood sugar checked during visit, initial reading 403, rechecked to verify reading is 413 Patient complaint of low energy , sweating and not sleeping well. Vitals signs stable , rechecked blood sugar after 30 minutes- 370. Blood sugar at 4:30 was 323 patient states she had eaten a sandwich. Will discuss with MD concerns related to elevated blood sugar. Reinforced with patient to take medications as prescribed and monitor blood sugars daily.   Fall -  reviewed fall precautions, patient denies dizziness, relates her recent fall to climbing up small steps on camper.  Depression - patient not taking Wellbutrin, amitriptyline or gabapentin due to various complaints.   Plan:  Placed call to [place] office regarding elevated blood sugar, able discuss concerns with Dr.Hamrick, CMA that will relay message to [place]. Urged patient in the importance of checking her blood sugar daily and , taking her insulin tonight and medication daily.  1700 Received phone call back from [place] at [facility] office wanting to verify if patient was taking her insulin, discussed that patient has not been taking insulin. Dr. Lisbeth Ply wants to make sure patient checks her blood sugar for the next few days, keep a record and call to  doctor office with reading and encourage patient to take her insulin.  Notify Mrs.Horlacher to record daily blood sugar reading as requested by  MD,take medications and call office with readings from next few days.  Will follow up with patient on next day.  Will discuss today's visit with Eduard Clos, LCSW., Send today's note to MD.   [facility] CM Care Plan Problem One   Flowsheet Row Most Recent Value  Care Plan Problem One  Knowledge deficit related Diabetes self care management as evidenced by elevated blood sugar readings   Role Documenting the Problem One  Care Management [place] for Problem One  Active  THN Long Term Goal (31-90 days)  Patient will report increase knowledge of Diabetes self care management in the next 90 days  THN Long Term Goal Start Date  11/01/15 [name] unmet date restarted ]  Interventions for Problem One Long Term Goal  Reinforced importance of keeping blood sugar under control and how if benefits the body  THN CM Short Term Goal #1 (0-30 days)  Patient will be able to demonstrate checking blood sugar daily and recording in the next 30 days  THN CM Short Term Goal #1 Start Date  12/11/15 [goal date  restarted]  Interventions for Short Term Goal #1  Discussed with patient importance of checking blood sugars daily to be aware of low or high blood sugar, placed reminder note on refrigerator, placed all supplies in easy reach,provided sheet to record blood sugars with date /reading    [facility] CM Short Term Goal #2 (0-30 days)  Patient will begin walking at least 10 minutes in home at least 3 days a week in the next 30 days  THN CM Short Term Goal #2 Start Date  09/20/15  [facility] CM Short Term Goal #2 Met Date  10/11/15  THN CM Short Term Goal #3 (0-30 days)  Patient will get strips from the pharmacy in the next 7 days  THN CM Short Term Goal #3 Start Date  09/20/15  [facility] CM Short Term Goal #3 Met Date  09/25/15  THN CM Short Term Goal #4 (0-30 days)  Patient will to monitor blood pressure and record at least 2 times weekly in the next 30 days   [facility] CM Short Term Goal #4 Start Date  10/11/15     [name], RN, [place] Care Management [phone]- Mobile [phone]- [place]

## 2015-12-12 ENCOUNTER — Other Ambulatory Visit: Payer: Self-pay | Admitting: *Deleted

## 2015-12-12 NOTE — Patient Outreach (Signed)
[place] [facility]) Care Management  12/12/2015  Alyssa [name] 04-05-1936 [id number]   Telephone Assessment  Follow up call to Alyssa [name] , HIPAA compliance verified. Alyssa [name] reports checking her blood sugar this am result 308, and states she took her insulin on last night.   Reinforced with patient to continue to keep a record of her blood sugars daily and continue to take her insulin his night as ordered and other medications daily. Reinforced with patient to notify [place] office with her blood sugar readings over the next few days.  Plan Will follow up with patient as a reminder to check blood sugar and report reading to [place]. Next call within 2 days.   [name], RN, [place] Management [phone]- Mobile [phone]- Toll Free Main Office

## 2015-12-13 NOTE — Patient Outreach (Signed)
[facility]) Care Management  12/13/2015  Alyssa [name] 07/22/1935 [id number]  This is a late entry for 12/11/15.  Home visit had been scheduled with patient in conjunction with [place] Coordinator, [name], following unsuccessful outreach attempts by [place] to reach patient.    Upon arrival for visit, patient stated she preferred to have her appointment rescheduled.  Briefly discussed with patient if she mailed appeals form to [employer] patient assistance program for Zetia, and she states she had---patient gave permission for [place] to call [employer] to follow-up her application and requested a call from [place] in a couple of weeks.    Patient then continued her previously scheduled home visit with [facility] RN.  On 12/12/15 1426, [place] called [employer] patient assistance program and was told that patient was approved to receive Zetia from [employer] patient assistance on 11/28/15 and medication was shipped ~12/10/15.    Called patient, who verified her HIPAA details and informed her of this---she was appreciative and verbalized understanding.   Plan:  Will make outreach attempt via phone within next 2-3 weeks per her request.   Karrie Meres, PharmD, [place] [phone]

## 2015-12-14 ENCOUNTER — Other Ambulatory Visit: Payer: Self-pay | Admitting: *Deleted

## 2015-12-14 NOTE — Patient Outreach (Signed)
[place] [facility]) Care Management  12/14/2015  Alyssa [name] 1935/04/18 [id number]   Telephone call to [name] , HIPAA compliance verified. Patient states she feeling pretty good this morning, eating breakfast , forgot to check her blood sugar this morning, reports blood sugar yesterday was 148. Patient states she continues to take her insulin each night 30 units of Lantus. Reminded [name] to continue to check blood sugar daily and record and take insulin each night. Discussed with [name] calling in her blood sugar record to [place] , states she will do it today.  Also discussed scheduling follow up appointment with PCP this month patient agreed that she will do that also.  Plan Will plan home visit in month of September.  [name], RN, [place] Management [phone]- Mobile [phone]- Toll Free Main Office

## 2015-12-14 NOTE — Patient Outreach (Signed)
[facility] [facility]) Care Management  12/14/2015  SALMAH BIRKETT 1935-10-02 [id number]   CSW spoke with patient by phone today- she is still deciding on a PCP as her PCP is leaving the practice. She reports plans to pick a new one before her PCP leaves.  Patient also shared with CSW that she has been thinkingmore about getting some counseling and agrees to CSW seeking options for her- "I think it would help". CSW validated her feelings and praised her for the decision. CSW will seek local options that accept her insurance and follow up with her with resources for appointment scheduling.   Eduard Clos, MSW, [place] Worker  [place] [phone]

## 2015-12-25 ENCOUNTER — Other Ambulatory Visit: Payer: Self-pay | Admitting: Pharmacist

## 2015-12-25 NOTE — Patient Outreach (Signed)
[facility]) Care Management  12/25/2015  Alyssa [name] Apr 04, 1936 [id number]  Successful phone outreach to patient.  HIPAA compliance verified.    Patient reports that she did receive her Zetia (ezetimibe) from [employer] Patient Assistance program and she reports she has been taking it and denies questions about ezetimibe at this time.   Patient reports that she has not been taking her other medications as regularly as she knows she should.  She does state she takes her insulin though.   Offered a pharmacy home visit to review her medications with her again, and she prefers to wait at this time.  Discussed use of pill planners with patient and she states that it is easier for her to take her medication out of the bottles vs the pill planner.    Patient states that she is trying to find a new PCP and that after she gets a new PCP she thinks she may be interested in a pharmacy home visit.  Encouraged patient to take her medications as prescribed by her medical providers.    Plan:  Will place a follow-up call to patient in the next month to see if she is interested in a pharmacy home visit or has further pharmacy needs at that time.    She is aware she can contact [place] if she has new questions/concerns on her medications.   Karrie Meres, PharmD, [place] [phone]

## 2015-12-26 ENCOUNTER — Other Ambulatory Visit: Payer: Self-pay | Admitting: *Deleted

## 2015-12-26 ENCOUNTER — Ambulatory Visit: Payer: Self-pay | Admitting: *Deleted

## 2015-12-26 DIAGNOSIS — E1139 Type 2 diabetes mellitus with other diabetic ophthalmic complication: Secondary | ICD-10-CM | POA: Diagnosis not present

## 2015-12-26 DIAGNOSIS — Z23 Encounter for immunization: Secondary | ICD-10-CM | POA: Diagnosis not present

## 2015-12-26 DIAGNOSIS — S80811A Abrasion, right lower leg, initial encounter: Secondary | ICD-10-CM | POA: Diagnosis not present

## 2015-12-26 DIAGNOSIS — Z6828 Body mass index (BMI) 28.0-28.9, adult: Secondary | ICD-10-CM | POA: Diagnosis not present

## 2015-12-26 NOTE — Patient Outreach (Signed)
[facility]) Care Management  12/26/2015  CHAYLEN FALCK 10-12-1935 [id number]   Arrived at patient home for routine home visit, no answer at door, placed call to patient  home, no answer and unable to leave a message.    Plan Will plan call in the next week to reschedule home visit.   [name], RN, [place] Management [phone]- Mobile [phone]- Toll Free Main Office

## 2015-12-31 ENCOUNTER — Other Ambulatory Visit: Payer: PPO | Admitting: *Deleted

## 2015-12-31 DIAGNOSIS — S80811D Abrasion, right lower leg, subsequent encounter: Secondary | ICD-10-CM | POA: Diagnosis not present

## 2015-12-31 DIAGNOSIS — Z6828 Body mass index (BMI) 28.0-28.9, adult: Secondary | ICD-10-CM | POA: Diagnosis not present

## 2015-12-31 NOTE — Patient Outreach (Signed)
[facility]) Care Management  12/31/2015  ADDISIN HEGEMAN 1936-02-28 [id number]  Unsuccessful telephone call to Mrs.Anthes to follow up on rescheduling home visit,no answer unable to leave a message .   Plan Will place on schedule to contact patient in the next 1 to 2 weeks to schedule a home visit.   [name], RN, [place] Management [phone]- Mobile [phone]- Toll Free Main Office

## 2016-01-08 DIAGNOSIS — Z6827 Body mass index (BMI) 27.0-27.9, adult: Secondary | ICD-10-CM | POA: Diagnosis not present

## 2016-01-08 DIAGNOSIS — E039 Hypothyroidism, unspecified: Secondary | ICD-10-CM | POA: Diagnosis not present

## 2016-01-08 DIAGNOSIS — E1139 Type 2 diabetes mellitus with other diabetic ophthalmic complication: Secondary | ICD-10-CM | POA: Diagnosis not present

## 2016-01-15 ENCOUNTER — Other Ambulatory Visit: Payer: Self-pay | Admitting: *Deleted

## 2016-01-15 NOTE — Patient Outreach (Signed)
[facility]) Care Management  01/15/2016  Alyssa [name] [birth year] [id number]  Telephone assessment  Placed call to Alyssa [name] to follow up and reschedule missed home visit in October. Alyssa [name] reports that she is beginning to feel better, states she stopped taking all of her medication except her thyroid pill and she has been walking in the park, sleeping some better. Alyssa [name] states she has started adding back some medication now, recently restarted taking what she described as clopidogrel . Alyssa [name] states she has not been taking her insulin , she has not checked her blood sugar.Asked Alyssa [name] about checking her blood sugar today she declined states she will  start to checking it and agreeable to receiving a call within a week .  Encouraged Alyssa [name] to monitor her blood sugar and keep a record. Counseled with Alyssa [name] the importance of taking medication as prescribed.  Alyssa [name] discussed that her PCP in [place] is no longer with the practice and that she has to find a new doctor, she is considering NP in the [place] office.     Plan Will schedule home visit in the next 2 weeks,  Alyssa [name] will begin to check her blood sugar and notify MD of consistent increases . Will follow with Alyssa [name] by phone on next week.  Will discuss this visit note with Alyssa [name] , Pharmacist    [facility] CM Care Plan Problem One   Flowsheet Row Most Recent Value  Care Plan Problem One  Knowledge deficit related Diabetes self care management as evidenced by elevated blood sugar readings   Role Documenting the Problem One  Care Management Coordinator  Care Plan for Problem One  Active  THN Long Term Goal (31-90 days)  Alyssa [name] will report increase knowledge of Diabetes self care management in the next 90 days  THN Long Term Goal Start Date  11/01/15 Alyssa [name] unmet date restarted ]  Interventions for Problem One Long Term Goal  Discussed with Alyssa [name] importance of taking medication and follow MD  recommendations   THN CM Short Term Goal #1 (0-30 days)  Alyssa [name] will be able to demonstrate checking blood sugar at least 3 days a week  and recording in the next 30 days  THN CM Short Term Goal #1 Start Date  01/15/16 [goal date restarted]  Interventions for Short Term Goal #1  Reinforced with Alyssa [name] the importance of checking blood sugar,knowing  level  to prevent problems   THN CM Short Term Goal #2 (0-30 days)  Alyssa [name] will begin walking at least 10 minutes in home at least 3 days a week in the next 30 days  THN CM Short Term Goal #2 Start Date  09/20/15  [facility] CM Short Term Goal #2 Met Date  10/11/15  THN CM Short Term Goal #3 (0-30 days)  Alyssa [name] will get strips from the pharmacy in the next 7 days  THN CM Short Term Goal #3 Start Date  09/20/15  [facility] CM Short Term Goal #3 Met Date  09/25/15  THN CM Short Term Goal #4 (0-30 days)  Alyssa [name] will to monitor blood pressure and record at least 2 times weekly in the next 30 days   THN CM Short Term Goal #4 Start Date  10/11/15  THN CM Short Term Goal #5 (0-30 days)  Alyssa [name] will make contact regarding a new provider in the next 30 days   THN CM Short Term Goal #5 Start Date  01/15/16  Interventions for Short Term Goal #5  Discussed with Alyssa [name] benefits of  having a provider for regular medical follow up care      Alyssa Draft, RN, [place] Management [phone]- Mobile [phone]- [place]

## 2016-01-18 ENCOUNTER — Other Ambulatory Visit: Payer: Self-pay | Admitting: Pharmacist

## 2016-01-18 NOTE — Patient Outreach (Signed)
[facility]) Care Management  01/18/2016  Alyssa [name] 07-17-1935 [id number]   Successful phone outreach to patient, she verified HIPAA details.   Review of notes from [place], from 01/15/16, indicates patient told [facility] RN she quit taking her medications and resumed some of them.   Today patient states she did indeed stop her medications and then resume them.  Counseled patient on importance of taking medications as prescribed by her prescribers.   Patient states she has an appointment with her new PCP on 01/22/16.  When patient was asked what [place] can do to assist her, today she states she would be interested in [place] explaining what her medications are for.    She had previously declined a home visit, but today she feels it would help her.    Plan:  Home visit scheduled with patient.    Karrie Meres, PharmD, [place] [phone]

## 2016-01-22 ENCOUNTER — Ambulatory Visit: Payer: Self-pay | Admitting: *Deleted

## 2016-01-22 DIAGNOSIS — E1139 Type 2 diabetes mellitus with other diabetic ophthalmic complication: Secondary | ICD-10-CM | POA: Diagnosis not present

## 2016-01-22 DIAGNOSIS — E663 Overweight: Secondary | ICD-10-CM | POA: Diagnosis not present

## 2016-01-22 DIAGNOSIS — Z6828 Body mass index (BMI) 28.0-28.9, adult: Secondary | ICD-10-CM | POA: Diagnosis not present

## 2016-01-22 DIAGNOSIS — E039 Hypothyroidism, unspecified: Secondary | ICD-10-CM | POA: Diagnosis not present

## 2016-01-22 DIAGNOSIS — E119 Type 2 diabetes mellitus without complications: Secondary | ICD-10-CM | POA: Insufficient documentation

## 2016-01-22 DIAGNOSIS — E782 Mixed hyperlipidemia: Secondary | ICD-10-CM | POA: Diagnosis not present

## 2016-01-22 DIAGNOSIS — I10 Essential (primary) hypertension: Secondary | ICD-10-CM | POA: Diagnosis not present

## 2016-01-22 DIAGNOSIS — Z2821 Immunization not carried out because of patient refusal: Secondary | ICD-10-CM | POA: Diagnosis not present

## 2016-01-22 DIAGNOSIS — I519 Heart disease, unspecified: Secondary | ICD-10-CM | POA: Diagnosis not present

## 2016-01-22 DIAGNOSIS — L600 Ingrowing nail: Secondary | ICD-10-CM | POA: Diagnosis not present

## 2016-01-22 NOTE — Patient Outreach (Signed)
[facility] [facility]) Care Management  01/22/2016  Alyssa [name] Sep 02, 1935 [id number]   Request received from Eduard Clos, LCSW to mail patient medicaid application. Mailed today, 01/22/16.  [name]

## 2016-01-23 ENCOUNTER — Other Ambulatory Visit: Payer: Self-pay | Admitting: Pharmacist

## 2016-01-23 NOTE — Patient Outreach (Signed)
[facility]) Care Management  [place]   01/23/2016  SAFARI FAUSEY 07-Dec-1935 [id number]  Late entry for 01/23/16.    Subjective:  Home visit with patient to review her medications.  Patient verified her HIPAA details upon arrival.   Patient's spouse, on [facility] Consent form, was also present during visit and she consented to this prior to starting home visit.   Patient had requested home visit to be able to review each of her medications.    Patient reports that she doesn't use a pill planner.  She reports that she had recently stopped all of her medications and then resumed some of her medications.    She reports she established with a new PCP with Ms Charlott Holler, NP at [facility] in [place].   Patient reports she does not have a medication list from her PCP appointment as she doesn't know where it is.  She denies financial concerns with medications at this time and she is presently receiving Zetia (ezetimibe) from [employer] Patient Assistance Program.    Objective:   Encounter Medications: Outpatient Encounter Prescriptions as of 01/23/2016  Medication Sig Note  . amitriptyline (ELAVIL) 10 MG tablet Take 10 mg by mouth at bedtime as needed for sleep. Reported on 10/04/2015 11/01/2015: Does not take on a regular basis  . aspirin 81 MG tablet Take 81 mg by mouth daily.   [name] atorvastatin (LIPITOR) 80 MG tablet Take 1 tablet (80 mg total) by mouth daily at 6 PM.   . buPROPion (WELLBUTRIN XL) 150 MG 24 hr tablet Take 150 mg by mouth daily. Reported on 11/01/2015 12/11/2015: Not taking  . carvedilol (COREG) 3.125 MG tablet Take 3.125 mg by mouth 2 (two) times daily with a meal.  04/02/2014: .  . clopidogrel (PLAVIX) 75 MG tablet Take 1 tablet (75 mg total) by mouth daily.   . Coenzyme Q10 (COQ10 PO) Take 1 capsule by mouth daily. Reported on 10/11/2015   . diclofenac sodium (VOLTAREN) 1 % GEL Apply 2 g topically daily as needed (pain). Reported on 11/01/2015   .  furosemide (LASIX) 20 MG tablet Take 20 mg by mouth daily.  04/02/2014: .  . gabapentin (NEURONTIN) 600 MG tablet Take 600 mg by mouth 3 (three) times daily. Reported on 11/01/2015   . glimepiride (AMARYL) 4 MG tablet Take 4 mg by mouth daily with breakfast. Reported on 11/01/2015 12/11/2015: Not taking  . HOMEOPATHIC PRODUCTS PO Take 1 tablet by mouth as needed. hylands leg cramps takes as needed   . insulin glargine (LANTUS) 100 UNIT/ML injection Inject 25 Units into the skin at bedtime.    [name] levothyroxine (SYNTHROID, LEVOTHROID) 125 MCG tablet Take 125 mcg by mouth daily before breakfast.    . levothyroxine (SYNTHROID, LEVOTHROID) 125 MCG tablet Take 125 mcg by mouth daily.   [name] losartan (COZAAR) 50 MG tablet Take 1 tablet (50 mg total) by mouth daily.   . meloxicam (MOBIC) 15 MG tablet Take 15 mg by mouth daily. Reported on 11/01/2015   . metFORMIN (GLUCOPHAGE) 500 MG tablet Take 1 tablet (500 mg total) by mouth 2 (two) times daily with a meal.   . nitroGLYCERIN (NITROSTAT) 0.4 MG SL tablet Place 0.4 mg under the tongue every 5 (five) minutes as needed for chest pain. Reported on 10/04/2015 04/02/2014: .  . polyethylene glycol (MIRALAX / GLYCOLAX) packet Take 17 g by mouth daily as needed (constipation). Reported on 10/04/2015   . spironolactone (ALDACTONE) 25 MG tablet Take 12.5  mg by mouth daily. Reported on 10/04/2015 04/02/2014: .  [name] ZETIA 10 MG tablet TAKE 1 TABLET BY MOUTH EVERY DAY    No facility-administered encounter medications on file as of 01/23/2016.     Functional Status: In your present state of health, do you have any difficulty performing the following activities: 12/11/2015 09/20/2015  Hearing? N N  Vision? N Y  Difficulty concentrating or making decisions? Y N  Walking or climbing stairs? Y Y  Dressing or bathing? N N  Doing errands, shopping? Y N  Preparing Food and eating ? N N  Using the Toilet? N N  In the past six months, have you accidently leaked urine? N Y  Do you  have problems with loss of bowel control? N N  Managing your Medications? Y -  Managing your Finances? N N  Housekeeping or managing your Housekeeping? [name]  Some recent data might be hidden    Fall/Depression Screening: PHQ 2/9 Scores 10/01/2015 09/20/2015 08/07/2015 05/31/2015  PHQ - 2 Score 6 5 1 1   PHQ- 9 Score 14 8 - -  Exception Documentation - (No Data) - -    Assessment:  Medication review per patient report and reviewing medication list in this chart.   Drugs sorted by system:  Neurologic/Psychologic: -bupropion---patient reports she is not taking  -amitriptyline---patient reports she is no longer taking    Cardiovascular: -aspirin 81 mg -atorvastatin  -carvedilol -clopidogrel  -ezetimibe -furosemide -losartan -nitroglycerin sublingual---patient reports she has not needed -spironolactone   Gastrointestinal: -polyethylene glycol (Miralax) as needed  Endocrine: -glimepiride---patient reports she is not taking -insulin detemir (Levemir)  -levothyroxine -metformin  Miscellaneous: -coenzyme Q-10  -gabapentin---patient reports she is not taking -meloxicam---patient reports she is no longer taking -diclofenac gel---patient reports she is no longer taking   Patient reports she is not taking the following medications that appeared on her medication list: -bupropion---she reports side effects and was counseled to discuss alternatives with her PCP -gabapentin -glimepiride -losartan -spironolactone  Recommend clarification if patient should be using any the above medications  Recommend close monitoring of patient's renal function and potassium if she is to take losartan and spironolactone.    Medication education:   Patient's questions were around her cardiac medications.   She was counseled on the common uses of these medications.    She was counseled her cardiac medications are for blood pressure control or cholesterol.  Her clopidogrel and aspirin are to  help reduce risk of stents clotting.   She was counseled losartan is also beneficial to the kidneys in patients with diabetes in addition to being a blood pressure lowering medication.     Plan:  Will route this note to patient's PCP, Charlott Holler, NP   Will place follow up call to patient in about 2 weeks.   Karrie Meres, PharmD, [place] [phone]

## 2016-01-24 ENCOUNTER — Encounter: Payer: Self-pay | Admitting: Vascular Surgery

## 2016-01-24 ENCOUNTER — Other Ambulatory Visit: Payer: Self-pay | Admitting: *Deleted

## 2016-01-24 NOTE — Patient Outreach (Signed)
[place] [facility]) Care Management  01/24/2016  LATISIA WRZESINSKI September 17, 1935 [id number]   Unsuccessful telephone attempt to reach Mrs.Arriola, unable to leave a message.   Plan Will plan home visit as already scheduled for next week.  [name], RN, [place] Management [phone]- Mobile [phone]- Toll Free Main Office

## 2016-01-30 ENCOUNTER — Ambulatory Visit: Payer: PPO | Admitting: Vascular Surgery

## 2016-01-30 ENCOUNTER — Inpatient Hospital Stay (HOSPITAL_COMMUNITY): Admission: RE | Admit: 2016-01-30 | Payer: PPO | Source: Ambulatory Visit

## 2016-01-31 ENCOUNTER — Other Ambulatory Visit: Payer: Self-pay | Admitting: *Deleted

## 2016-01-31 NOTE — Patient Outreach (Signed)
[facility]) Care Management  01/31/2016  Alyssa [name] [birth year] [id number]  Patient with scheduled home visit today, placed call prior to arrival , Alyssa [name] states they have been busy on today,discussed her husband's appointments, and states they are leaving the house again now.  Patient requested call in the next few weeks to reschedule home visit. Alyssa [name] states that she is taking her medication,she discussed her recent visit to Alyssa Holler, Alyssa [name] and states I believe that its  going to work out better. Patient discussed her recent blood sugar readings of 168 and 180 for the last 2 mornings . Patient requested that I call back in a few weeks to reschedule home visit and discuss plan for ongoing education for managing her diabetes.   Assessment  Patient will benefit from ongoing education, support and management of diabetes.   Plan Will place on schedule to call patient in the next 2 weeks regarding rescheduling home visit.    [facility] CM Care Plan Problem One   Flowsheet Row Most Recent Value  Care Plan Problem One  Knowledge deficit related Diabetes self care management as evidenced by elevated blood sugar readings   Role Documenting the Problem One  Care Management [place] for Problem One  Active  THN Long Term Goal (31-90 days)  Patient will report increase knowledge of Diabetes self care management in the next 90 days  THN Long Term Goal Start Date  11/01/15 [name] unmet date restarted ]  Interventions for Problem One Long Term Goal  Reinforced with patient importance of continuing to take medications as prescribed, follow up PCP for new or worsening symptoms   THN CM Short Term Goal #1 (0-30 days)  Patient will be able to demonstrate checking blood sugar at least 3 days a week  and recording in the next 30 days  THN CM Short Term Goal #1 Start Date  01/15/16 [goal date restarted]  Interventions for Short Term Goal #1  encouraged patient to continue to  check her blood sugar.  THN CM Short Term Goal #2 (0-30 days)  Patient will begin walking at least 10 minutes in home at least 3 days a week in the next 30 days  THN CM Short Term Goal #2 Start Date  09/20/15  [facility] CM Short Term Goal #2 Met Date  10/11/15  THN CM Short Term Goal #3 (0-30 days)  Patient will get strips from the pharmacy in the next 7 days  THN CM Short Term Goal #3 Start Date  09/20/15  [facility] CM Short Term Goal #3 Met Date  09/25/15  THN CM Short Term Goal #4 (0-30 days)  Patient will to monitor blood pressure and record at least 2 times weekly in the next 30 days   THN CM Short Term Goal #4 Start Date  10/11/15  THN CM Short Term Goal #5 (0-30 days)  Patient will make contact regarding a new provider in the next 30 days   THN CM Short Term Goal #5 Start Date  01/15/16  [facility] CM Short Term Goal #5 Met Date  01/31/16     [name], RN, [place] Management [phone]- Mobile [phone]- [place]

## 2016-02-06 DIAGNOSIS — L299 Pruritus, unspecified: Secondary | ICD-10-CM | POA: Diagnosis not present

## 2016-02-06 DIAGNOSIS — M25511 Pain in right shoulder: Secondary | ICD-10-CM | POA: Diagnosis not present

## 2016-02-06 DIAGNOSIS — Z6828 Body mass index (BMI) 28.0-28.9, adult: Secondary | ICD-10-CM | POA: Diagnosis not present

## 2016-02-08 ENCOUNTER — Ambulatory Visit: Payer: Self-pay | Admitting: *Deleted

## 2016-02-13 ENCOUNTER — Other Ambulatory Visit: Payer: Self-pay | Admitting: Pharmacist

## 2016-02-13 NOTE — Patient Outreach (Signed)
[place] [facility]) Care Management  02/13/2016  Alyssa [name] Mar 23, 1936 [id number]  Successful phone outreach to patient, she verified her HIPAA details.  Patient reports she has been "doing better taking medications."  She denies wanting to use a pill planner for her medications.    Offered a follow-up home visit to patient, and she states she doesn't think she needs one at this time, she prefers a follow-up phone call.   Patient states she may have missed a day of medication in the past week.  She denies experiencing side effects to her medications at this time.   She states she needs a refill of insulin and plans to contact her prescriber for refill.    Counseled patient to continue taking her medications as prescribed by her prescribers.   Plan:  Per patient request, will make follow-up phone call attempt to patient in the next 2 weeks.   Karrie Meres, PharmD, [place] [phone]

## 2016-02-18 ENCOUNTER — Ambulatory Visit: Payer: PPO | Admitting: *Deleted

## 2016-02-19 ENCOUNTER — Other Ambulatory Visit: Payer: Self-pay | Admitting: *Deleted

## 2016-02-19 NOTE — Patient Outreach (Signed)
[place] [facility]) Care Management  02/19/2016  Alyssa [name] October 11, 1935 [id number]   Telephone assessment call to patient to reschedule home visit. Alyssa [name] states she believes she is doing better, more energy today for taking care of household chores, patient reports she is doing better with taking her medications on a more regular basis.  Patient states her blood sugar reading has been better, recent reading 103, reports taking her insulin as prescribed. Alyssa [name] discussed problem she had on last evening with getting her strip to read in her meter, states she was using the correct strip in the correct meter.  Patient agreeable for care coordinator visit to assist with new problem with use of meter/strip. Discussed telephonic health coach program  for continued education and management of Diabetes, patient is agreeable.  Plan Will place home visit in the next 2 weeks. Will plan transition to  health coach after next visit if no new care coordination needs to be addressed.   [facility] CM Care Plan Problem One   Flowsheet Row Most Recent Value  Care Plan Problem One  Knowledge deficit related Diabetes self care management as evidenced by elevated blood sugar readings   Role Documenting the Problem One  Care Management [place] for Problem One  Active  THN Long Term Goal (31-90 days)  Patient will report increase knowledge of Diabetes self care management in the next 90 days  THN Long Term Goal Start Date  12/11/15 [name] unmet date restarted on 8/29]  Interventions for Problem One Long Term Goal  Reinforced with patient importance of continuing to take medications as prescribed, follow up PCP for new or worsening symptoms   THN CM Short Term Goal #1 (0-30 days)  Patient will be able to demonstrate checking blood sugar at least 3 days a week  and recording in the next 30 days  THN CM Short Term Goal #1 Start Date  01/15/16 [goal date restarted]  Interventions for Short  Term Goal #1  home visit to review use of meter  THN CM Short Term Goal #2 (0-30 days)  Patient will begin walking at least 10 minutes in home at least 3 days a week in the next 30 days  THN CM Short Term Goal #2 Start Date  09/20/15  [facility] CM Short Term Goal #2 Met Date  10/11/15  THN CM Short Term Goal #3 (0-30 days)  Patient will get strips from the pharmacy in the next 7 days  THN CM Short Term Goal #3 Start Date  09/20/15  [facility] CM Short Term Goal #3 Met Date  09/25/15  THN CM Short Term Goal #4 (0-30 days)  Patient will to monitor blood pressure and record at least 2 times weekly in the next 30 days   THN CM Short Term Goal #4 Start Date  10/11/15  THN CM Short Term Goal #5 (0-30 days)  Patient will make contact regarding a new provider in the next 30 days   THN CM Short Term Goal #5 Start Date  01/15/16  [facility] CM Short Term Goal #5 Met Date  01/31/16     [name], RN, [place] Care Management [phone]- Mobile [phone]- [place]

## 2016-02-25 ENCOUNTER — Encounter: Payer: Self-pay | Admitting: *Deleted

## 2016-02-25 ENCOUNTER — Other Ambulatory Visit: Payer: Self-pay | Admitting: *Deleted

## 2016-02-25 NOTE — Patient Outreach (Signed)
[place] [facility]) Care Management   02/25/2016  MARIT GOODWILL June 06, 1935 [id number]  CUMI SANAGUSTIN is an 80 y.o. female  Subjective:  Patient discussed not sleeping well on last night due to being awakening by new dog, patient states she just got up this afternoon, blood sugar at visit 388 patient reports she has eaten in the past hour. Patient discussed that she didn't take her insulin on last night, just got busy and forgot, she states she has not checked her blood sugar a few week discussed she just gets busy and forgets.Patient discussed she had difficulty using meter at recent telephone call.   Patient reports that she is taking her medications on most days,but forget to take her insulin at night, patient states she normally takes 25 units of insulin that's what the label on the box reads  but is unsure if that is still the dose  she is to take, reports she just got a new bottle.      Objective:  BP 136/80   Pulse 96   Resp 18   SpO2 97%  Review of Systems  Constitutional: Negative.   HENT: Negative.   Eyes: Negative.   Respiratory: Negative.   Cardiovascular: Negative.   Gastrointestinal: Negative.   Genitourinary: Negative.   Musculoskeletal: Positive for joint pain.  Skin: Negative.   Neurological: Negative.   Endo/Heme/Allergies: Negative.   Psychiatric/Behavioral: The patient has insomnia.        Difficulty sleeping at times, has improved some    Physical Exam  Constitutional: She is oriented to person, place, and time. She appears well-developed and well-nourished.  Cardiovascular: Normal rate, normal heart sounds and intact distal pulses.   Respiratory: Effort normal.  GI: Soft.  Musculoskeletal: Normal range of motion.  Neurological: She is alert and oriented to person, place, and time.  Skin: Skin is warm and dry.  Psychiatric: She has a normal mood and affect. Her behavior is normal. Judgment and thought content normal.    Encounter  Medications:   Outpatient Encounter Prescriptions as of 02/25/2016  Medication Sig Note  . amitriptyline (ELAVIL) 10 MG tablet Take 10 mg by mouth at bedtime as needed for sleep. Reported on 10/04/2015 11/01/2015: Does not take on a regular basis  . aspirin 81 MG tablet Take 81 mg by mouth daily.   [name] atorvastatin (LIPITOR) 80 MG tablet Take 1 tablet (80 mg total) by mouth daily at 6 PM.   . buPROPion (WELLBUTRIN XL) 150 MG 24 hr tablet Take 150 mg by mouth daily. Reported on 11/01/2015 12/11/2015: Not taking  . carvedilol (COREG) 3.125 MG tablet Take 3.125 mg by mouth 2 (two) times daily with a meal.  04/02/2014: .  . clopidogrel (PLAVIX) 75 MG tablet Take 1 tablet (75 mg total) by mouth daily.   . Coenzyme Q10 (COQ10 PO) Take 1 capsule by mouth daily. Reported on 10/11/2015   . diclofenac sodium (VOLTAREN) 1 % GEL Apply 2 g topically daily as needed (pain). Reported on 11/01/2015   . furosemide (LASIX) 20 MG tablet Take 20 mg by mouth daily.  04/02/2014: .  . gabapentin (NEURONTIN) 600 MG tablet Take 600 mg by mouth 3 (three) times daily. Reported on 11/01/2015   . glimepiride (AMARYL) 4 MG tablet Take 4 mg by mouth daily with breakfast. Reported on 11/01/2015 12/11/2015: Not taking  . HOMEOPATHIC PRODUCTS PO Take 1 tablet by mouth as needed. hylands leg cramps takes as needed   . insulin detemir (LEVEMIR) 100  UNIT/ML injection Inject 25 Units into the skin at bedtime.   [name] levothyroxine (SYNTHROID, LEVOTHROID) 125 MCG tablet Take 125 mcg by mouth daily before breakfast.    . levothyroxine (SYNTHROID, LEVOTHROID) 125 MCG tablet Take 125 mcg by mouth daily.   [name] losartan (COZAAR) 50 MG tablet Take 1 tablet (50 mg total) by mouth daily.   . meloxicam (MOBIC) 15 MG tablet Take 15 mg by mouth daily. Reported on 11/01/2015   . metFORMIN (GLUCOPHAGE) 500 MG tablet Take 1 tablet (500 mg total) by mouth 2 (two) times daily with a meal.   . nitroGLYCERIN (NITROSTAT) 0.4 MG SL tablet Place 0.4 mg under the tongue  every 5 (five) minutes as needed for chest pain. Reported on 10/04/2015 04/02/2014: .  . polyethylene glycol (MIRALAX / GLYCOLAX) packet Take 17 g by mouth daily as needed (constipation). Reported on 10/04/2015   . spironolactone (ALDACTONE) 25 MG tablet Take 12.5 mg by mouth daily. Reported on 10/04/2015 04/02/2014: .  [name] ZETIA 10 MG tablet TAKE 1 TABLET BY MOUTH EVERY DAY    No facility-administered encounter medications on file as of 02/25/2016.     Functional Status:   In your present state of health, do you have any difficulty performing the following activities: 12/11/2015 09/20/2015  Hearing? N N  Vision? N Y  Difficulty concentrating or making decisions? Y N  Walking or climbing stairs? Y Y  Dressing or bathing? N N  Doing errands, shopping? Y N  Preparing Food and eating ? N N  Using the Toilet? N N  In the past six months, have you accidently leaked urine? N Y  Do you have problems with loss of bowel control? N N  Managing your Medications? Y -  Managing your Finances? N N  Housekeeping or managing your Housekeeping? [name]  Some recent data might be hidden    Fall/Depression Screening:    PHQ 2/9 Scores 10/01/2015 09/20/2015 08/07/2015 05/31/2015  PHQ - 2 Score _0 PHQ- 9 Score 14 8 - -  Exception Documentation - (No Data) - -    Assessment:  Routine home visit  Diabetes- Not checking blood sugars, meter reviewed she has only checked it twice in the last 30 days.  Return demonstration of how to use meter , demonstrated appropriate use,all needed supplies available. Need clarification on daily insulin dose. Patient will benefit from continued education and monitoring of diabetes.   Discussed telephonic health coach role program in providing continuing education support and management of Diabetes patient is agreeable to program in future, agreeable to continued community care management for now.  Plan:  Placed call to [facility] in [place] to notify of blood sugar  reading today of 388, to verify patient daily dose of levimir insulin, able to leave a message with Anderson Malta regarding concerns,call back number provided. Will follow up with patient by telephone as requested in the next week regarding blood sugar monitoring,  Will send this visit note to PCP office.  THN CM Care Plan Problem One   Flowsheet Row Most Recent Value  Care Plan Problem One  Knowledge deficit related Diabetes self care management as evidenced by elevated blood sugar readings   Role Documenting the Problem One  Care Management [place] for Problem One  Active  THN Long Term Goal (31-90 days)  Patient will report increase knowledge of Diabetes self care management in the next 90 days  THN Long Term Goal Start Date  12/11/15 [goal unmet date restarted on 8/29]  Interventions for Problem One Long Term Goal  Discussed importance of taking medications daily as prescribed, reviewed EMMI on Diabetes day to day care   THN CM Short Term Goal #1 (0-30 days)  Patient will be able to demonstrate checking blood sugar at least 3 days a week  and recording in the next 30 days  THN CM Short Term Goal #1 Start Date  01/15/16 [goal date restarted]  Interventions for Short Term Goal #1  patient return demonstrate proper use of meter, provided and reviewed EMMI handout on knowing A1c, reviewed recent A1c result, Bold reminder note on refrigerator    THN CM Short Term Goal #2 (0-30 days)  Patient will begin walking at least 10 minutes in home at least 3 days a week in the next 30 days  THN CM Short Term Goal #2 Start Date  09/20/15  [facility] CM Short Term Goal #2 Met Date  10/11/15  THN CM Short Term Goal #3 (0-30 days)  Patient will get strips from the pharmacy in the next 7 days  THN CM Short Term Goal #3 Start Date  09/20/15  [facility] CM Short Term Goal #3 Met Date  09/25/15  THN CM Short Term Goal #4 (0-30 days)  Patient will to monitor blood pressure and record at least 2 times weekly in the next  30 days   THN CM Short Term Goal #4 Start Date  10/11/15  THN CM Short Term Goal #5 (0-30 days)  Patient will make contact regarding a new provider in the next 30 days   THN CM Short Term Goal #5 Start Date  01/15/16  [facility] CM Short Term Goal #5 Met Date  01/31/16      [name], RN, [place] Management [phone]- Mobile [phone]- [place]

## 2016-02-26 ENCOUNTER — Other Ambulatory Visit: Payer: Self-pay | Admitting: *Deleted

## 2016-02-26 NOTE — Patient Outreach (Signed)
[place] [facility]) Care Management  02/26/2016  [name] [birth year] [id number]   Incoming call from [facility], [place] for Alyssa [name] , PCP of Alyssa [name], regarding call to office on yesterday regarding  concern regarding patient medication compliance, not checking blood sugar  and verification of insulin dose.'  [name] reports patient received instructions at her recent office to increase Levemir  insulin by 3 units every 3 days until her morning glucose is 130 or less, verified patient currently taking per her report 25 units at night.  Plan  Will place call to patient and reinforce MD instructions on insulin dose.  [name] call to patient,she reports taking 25 units of insulin on last night,and has not checked her blood sugar on today. Reviewed with patient instructions she received at recent PCP office visit regarding increasing insulin by 3 units every 3 days units until morning blood sugar 130 or less, reviewed with teach back, patient able to verbalize amounts of insulin to take tonight  28 units (Tuesday,),Friday, 31 units patient states she has appropriate insulin syringes.. Reinforced with patient importance  to check her blood sugar daily to know how insulin is working.   Plan Will place follow up telephone call to patient in the next 6 days.    [name], RN, [place] Management [phone]- Mobile [phone]- Toll Free Main Office

## 2016-02-27 ENCOUNTER — Ambulatory Visit: Payer: Self-pay | Admitting: Pharmacist

## 2016-02-28 ENCOUNTER — Other Ambulatory Visit: Payer: Self-pay | Admitting: Pharmacist

## 2016-02-28 NOTE — Patient Outreach (Signed)
[place] [facility]) Care Management  02/28/2016  AKSHA THIER 1935-05-28 [id number]  Successful phone outreach to patient regarding medication adherence.  Patient verified HIPAA details.  Patient reports she is taking her medications "most days."  She reports she missed "a couple doses."  Patient reports she feels a home visit to review her medications again would help her---she would like to make sure she understands her medications.   Plan:  Home visit scheduled with patient.   Karrie Meres, PharmD, [place] [phone]

## 2016-02-29 ENCOUNTER — Other Ambulatory Visit: Payer: Self-pay | Admitting: Pharmacist

## 2016-02-29 NOTE — Patient Outreach (Signed)
[place] [facility]) Care Management  [address]   02/29/2016  Alyssa [name] [birth year] [id number]  Subjective:  Home visit today with patient as she requested it to ask questions about her medications.    Patient reports that she puts her medications on the table, lines them up and takes them.    She declines wanting to use a pill box or adherence packaging.  She reports she is doing better taking medications but reports still missing some.    She states she sometimes misses her atorvastatin in the evening.    Objective:   Encounter Medications: Outpatient Encounter Prescriptions as of 02/29/2016  Medication Sig Note  . amitriptyline (ELAVIL) 10 MG tablet Take 10 mg by mouth at bedtime as needed for sleep. Reported on 10/04/2015 11/01/2015: Does not take on a regular basis  . aspirin 81 MG tablet Take 81 mg by mouth daily.   [name] atorvastatin (LIPITOR) 80 MG tablet Take 1 tablet (80 mg total) by mouth daily at 6 PM.   . buPROPion (WELLBUTRIN XL) 150 MG 24 hr tablet Take 150 mg by mouth daily. Reported on 11/01/2015 12/11/2015: Not taking  . carvedilol (COREG) 3.125 MG tablet Take 3.125 mg by mouth 2 (two) times daily with a meal.  04/02/2014: .  . clopidogrel (PLAVIX) 75 MG tablet Take 1 tablet (75 mg total) by mouth daily.   . Coenzyme Q10 (COQ10 PO) Take 1 capsule by mouth daily. Reported on 10/11/2015   . diclofenac sodium (VOLTAREN) 1 % GEL Apply 2 g topically daily as needed (pain). Reported on 11/01/2015 02/25/2016: Not taking  . furosemide (LASIX) 20 MG tablet Take 20 mg by mouth daily.  04/02/2014: .  . gabapentin (NEURONTIN) 600 MG tablet Take 600 mg by mouth 3 (three) times daily. Reported on 11/01/2015   . glimepiride (AMARYL) 4 MG tablet Take 4 mg by mouth daily with breakfast. Reported on 11/01/2015 12/11/2015: Not taking  . HOMEOPATHIC PRODUCTS PO Take 1 tablet by mouth as needed. hylands leg cramps takes as needed   . insulin detemir (LEVEMIR) 100 UNIT/ML  injection Inject 25 Units into the skin at bedtime.   [name] levothyroxine (SYNTHROID, LEVOTHROID) 125 MCG tablet Take 125 mcg by mouth daily before breakfast.    . levothyroxine (SYNTHROID, LEVOTHROID) 125 MCG tablet Take 125 mcg by mouth daily.   [name] losartan (COZAAR) 50 MG tablet Take 1 tablet (50 mg total) by mouth daily.   . meloxicam (MOBIC) 15 MG tablet Take 15 mg by mouth daily. Reported on 11/01/2015 02/25/2016: Has if needed  . metFORMIN (GLUCOPHAGE) 500 MG tablet Take 1 tablet (500 mg total) by mouth 2 (two) times daily with a meal.   . nitroGLYCERIN (NITROSTAT) 0.4 MG SL tablet Place 0.4 mg under the tongue every 5 (five) minutes as needed for chest pain. Reported on 10/04/2015 04/02/2014: .  . polyethylene glycol (MIRALAX / GLYCOLAX) packet Take 17 g by mouth daily as needed (constipation). Reported on 10/04/2015   . spironolactone (ALDACTONE) 25 MG tablet Take 12.5 mg by mouth daily. Reported on 10/04/2015 04/02/2014: .  [name] ZETIA 10 MG tablet TAKE 1 TABLET BY MOUTH EVERY DAY    No facility-administered encounter medications on file as of 02/29/2016.     Functional Status: In your present state of health, do you have any difficulty performing the following activities: 12/11/2015 09/20/2015  Hearing? N N  Vision? N Y  Difficulty concentrating or making decisions? Y N  Walking or climbing stairs? Alyssa [name]  Y  Dressing or bathing? N N  Doing errands, shopping? Y N  Preparing Food and eating ? N N  Using the Toilet? N N  In the past six months, have you accidently leaked urine? N Y  Do you have problems with loss of bowel control? N N  Managing your Medications? Y -  Managing your Finances? N N  Housekeeping or managing your Housekeeping? Alyssa [name]  Some recent data might be hidden    Fall/Depression Screening: PHQ 2/9 Scores 10/01/2015 09/20/2015 08/07/2015 05/31/2015  PHQ - 2 Score 6 5 1 1   PHQ- 9 Score 14 8 - -  Exception Documentation - (No Data) - -    Assessment:  Medication adherence:     Counseled patient atorvastatin could be taken at a different time of day if she remembers it better versus at bedtime.    Medication education:  She asked about a consumer reports article regarding food interactions with thyroid medication.  Counseled patient it is best to take levothyroxine without food to improve absorption.    Patient counseled to take her medications as prescribed by her providers---compliance appears to have improved but she still admits to missing medication "a couple days per week."   Medication discrepancy:   Spironolactone---medication list notes 12.5 mg daily, patient is taking 25 mg daily----suggest dose clarification.  Prescription bottle in patient's possession last written by Dr Lisbeth Ply.    Plan:  Will call patient in about 3 weeks to follow-up.  Will route note to PCP.    Karrie Meres, PharmD, [place] [phone]

## 2016-03-03 ENCOUNTER — Other Ambulatory Visit: Payer: Self-pay | Admitting: *Deleted

## 2016-03-03 NOTE — Patient Outreach (Addendum)
[place] [facility]) Care Management  03/03/2016  Alyssa [name] 06/02/1935 [id number]   Telephone assessment follow up call.  Spoke with patient to follow up blood sugar readings , patient reports she last checked blood sugar about 2 days ago, unable to recall the reading. Patient reports she continues to take 25 units of insulin at night, reports she did take insulin on last night.  Alyssa [name] is agreeable to scheduling a home visit in a few weeks for continued education , support in management of diabetes. Reinforced importance of checking blood sugar and taking insulin per MD recommendations.  Plan Will plan home visit in next month per patient request, patient agreeable to date.   [facility] CM Care Plan Problem One   Flowsheet Row Most Recent Value  Care Plan Problem One  Knowledge deficit related Diabetes self care management as evidenced by elevated blood sugar readings   Role Documenting the Problem One  Care Management [place] for Problem One  Active  THN Long Term Goal (31-90 days)  Patient will report increase knowledge of Diabetes self care management in the next 90 days  THN Long Term Goal Start Date  12/11/15 Alyssa [name] unmet date restarted on 8/29]  Interventions for Problem One Long Term Goal  Reviewed with patient importance of controlling blood sugar by taking medications as prescribed, following up to MD about new or worsening problem, elevated blood sugars.   THN CM Short Term Goal #1 (0-30 days)  Patient will be able to demonstrate checking blood sugar at least 3 days a week  and recording in the next 30 days  THN CM Short Term Goal #1 Start Date  03/03/16 [goal date restarted]  Interventions for Short Term Goal #1  reinforced importance of monitoring blood sugar  THN CM Short Term Goal #2 (0-30 days)  Patient will begin walking at least 10 minutes in home at least 3 days a week in the next 30 days  THN CM Short Term Goal #2 Start Date  09/20/15  [place] CM  Short Term Goal #2 Met Date  10/11/15  THN CM Short Term Goal #3 (0-30 days)  Patient will get strips from the pharmacy in the next 7 days  THN CM Short Term Goal #3 Start Date  09/20/15  [facility] CM Short Term Goal #3 Met Date  09/25/15  THN CM Short Term Goal #4 (0-30 days)  Patient will to monitor blood pressure and record at least 2 times weekly in the next 30 days   THN CM Short Term Goal #4 Start Date  10/11/15  THN CM Short Term Goal #5 (0-30 days)  Patient will make contact regarding a new provider in the next 30 days   THN CM Short Term Goal #5 Start Date  01/15/16  [facility] CM Short Term Goal #5 Met Date  01/31/16     Alyssa Draft, RN, [place] Management [phone]- Mobile [phone]- [place]

## 2016-03-10 ENCOUNTER — Ambulatory Visit: Payer: PPO | Admitting: *Deleted

## 2016-03-17 ENCOUNTER — Other Ambulatory Visit: Payer: Self-pay | Admitting: *Deleted

## 2016-03-17 NOTE — Patient Outreach (Signed)
[facility]) Care Management  03/17/2016  Alyssa [name] February 19, 1936 [id number]    CSW spoke with patient who reports she is doing ok; she is wanting to go see a counselor for her depression and emotional needs. She acknowledges that with the holidays she is feeling more sadness but denies any SI/HI. She continues to take her anti-depressant and is open to seeing a therapist which has been set up for 04/09/16 at [facility] ([place]).   CSW will plan f/u call to patient later this month as a reminder and for further assessment.Eduard Clos, MSW, [place] Worker  [place] [phone]

## 2016-03-19 ENCOUNTER — Encounter: Payer: Self-pay | Admitting: Vascular Surgery

## 2016-03-21 ENCOUNTER — Other Ambulatory Visit: Payer: Self-pay | Admitting: Pharmacist

## 2016-03-21 NOTE — Patient Outreach (Signed)
[place] [facility]) Care Management  03/21/2016  ELLSIE GOOKIN [birth year] [id number]  Successful phone outreach to patient regarding her medication adherence.   Patient verified HIPAA details.   Patient reports she has been taking her medications with the exception of metformin.  She reports she read that metformin can decrease B12 and cause tingling in the hands.  She states she stopped metformin and started B12 and has felt better, with less tingling in her hands.  She states she did not let her PCP know about this.   Counseled patient on importance of discussing medication changes with prescriber.    She states she prefers a follow-up phone call the end of the month to see how she is doing versus a home visit as she feels she is doing much better now.    Metformin may decrease vitamin B12 concentration in the body.   Plan:  Will route note to PCP regarding patient stopping metformin.   Will make phone outreach to patient later this month per her request.   Karrie Meres, PharmD, [place] [phone]

## 2016-03-25 ENCOUNTER — Other Ambulatory Visit: Payer: Self-pay | Admitting: *Deleted

## 2016-03-25 NOTE — Patient Outreach (Signed)
[place] [facility]) Care Management  03/25/2016  RONIN SAW 04-May-1935 [id number]   CSW spoke with patient and advised her of her initial appointment at [facility] set for 04/09/16.  Patient is familiar with the area, as she reports, "we use to live in [place]".  Patient in good spirits- denies any concerns at this time.  CSW will plan f/u call 12/28.     Eduard Clos, MSW, [place] Worker  [place] [phone]

## 2016-03-25 NOTE — Patient Outreach (Addendum)
[facility]) Care Management   03/25/2016  Alyssa [name] 10/07/1935 [id number]  Alyssa [name] is an 80 y.o. female  Subjective:  Patient reports not feeling well today due to not sleeping well on last night, and complaint of having constipation. Patient reports that she has used miralax, for previous 2 days she is out of it for today, but plans to purchase more.  Patient discussed that she is taking about 30 units of insulin at night and lately she has been taking it nightly. Patient states she has not been checking her blood sugar she just forgets.  Patient discussed she is just not used to taking care of herself, she is so used to taking care of someone else. Alyssa [name] she is looking to her visit with counselor at behavioral health center on this month.  Patient discussed that she stopped metformin due to article that she read about it causing tingling in hands, not sleeping, depression forgetfulness and started taking B12 and D3  Reported feeling better the next day.   Patient discussed having a fall at home a few weeks ago while bending over to pick up an object from the floor.   Patient states she has not taken her flu shot but she is thinking about it.   Objective: BP 128/70 (BP Location: Left Arm, Patient Position: Sitting)   Pulse 74   Resp 18   SpO2 96%   Sitting on sofa in her robe. Review of Systems  Constitutional: Negative.   HENT: Negative.   Respiratory: Negative.   Cardiovascular: Negative.   Gastrointestinal: Positive for constipation.  Genitourinary: Negative.   Musculoskeletal: Positive for falls.  Skin: Negative.   Neurological: Negative.   Endo/Heme/Allergies: Negative.   Psychiatric/Behavioral: Positive for depression. The patient is nervous/anxious.     Physical Exam  Constitutional: She is oriented to person, place, and time. She appears well-developed and well-nourished.  Cardiovascular: Normal rate, normal heart sounds and intact  distal pulses.   Respiratory: Effort normal and breath sounds normal.  GI: Soft. Bowel sounds are normal.  Neurological: She is alert and oriented to person, place, and time.  Skin: Skin is warm and dry.  Psychiatric: She has a normal mood and affect. Her behavior is normal. Judgment and thought content normal.  Reports some forgetfulness     Encounter Medications:   Outpatient Encounter Prescriptions as of 03/25/2016  Medication Sig Note  . aspirin 81 MG tablet Take 81 mg by mouth daily.   [name] atorvastatin (LIPITOR) 80 MG tablet Take 1 tablet (80 mg total) by mouth daily at 6 PM.   . carvedilol (COREG) 3.125 MG tablet Take 3.125 mg by mouth 2 (two) times daily with a meal.  04/02/2014: .  . clopidogrel (PLAVIX) 75 MG tablet Take 1 tablet (75 mg total) by mouth daily.   . Coenzyme Q10 (COQ10 PO) Take 1 capsule by mouth daily. Reported on 10/11/2015   . furosemide (LASIX) 20 MG tablet Take 20 mg by mouth daily.  04/02/2014: .  . gabapentin (NEURONTIN) 600 MG tablet Take 600 mg by mouth 3 (three) times daily. Reported on 11/01/2015   . insulin detemir (LEVEMIR) 100 UNIT/ML injection Inject 25 Units into the skin at bedtime.   [name] levothyroxine (SYNTHROID, LEVOTHROID) 125 MCG tablet Take 125 mcg by mouth daily.   [name] losartan (COZAAR) 50 MG tablet Take 1 tablet (50 mg total) by mouth daily.   . nitroGLYCERIN (NITROSTAT) 0.4 MG SL tablet Place 0.4 mg under the  tongue every 5 (five) minutes as needed for chest pain. Reported on 10/04/2015 04/02/2014: .  . polyethylene glycol (MIRALAX / GLYCOLAX) packet Take 17 g by mouth daily as needed (constipation). Reported on 10/04/2015   . spironolactone (ALDACTONE) 25 MG tablet Take 12.5 mg by mouth daily. Reported on 10/04/2015 02/29/2016: Patient reports taking 50 mg---1/2 tab daily.    [name] ZETIA 10 MG tablet TAKE 1 TABLET BY MOUTH EVERY DAY   . amitriptyline (ELAVIL) 10 MG tablet Take 10 mg by mouth at bedtime as needed for sleep. Reported on 10/04/2015 11/01/2015:  Does not take on a regular basis  . buPROPion (WELLBUTRIN XL) 150 MG 24 hr tablet Take 150 mg by mouth daily. Reported on 11/01/2015 12/11/2015: Not taking  . diclofenac sodium (VOLTAREN) 1 % GEL Apply 2 g topically daily as needed (pain). Reported on 11/01/2015 02/25/2016: Not taking  . glimepiride (AMARYL) 4 MG tablet Take 4 mg by mouth daily with breakfast. Reported on 11/01/2015 12/11/2015: Not taking  . HOMEOPATHIC PRODUCTS PO Take 1 tablet by mouth as needed. hylands leg cramps takes as needed   . levothyroxine (SYNTHROID, LEVOTHROID) 125 MCG tablet Take 125 mcg by mouth daily before breakfast.    . meloxicam (MOBIC) 15 MG tablet Take 15 mg by mouth daily. Reported on 11/01/2015 02/25/2016: Has if needed  . metFORMIN (GLUCOPHAGE) 500 MG tablet Take 1 tablet (500 mg total) by mouth 2 (two) times daily with a meal. (Patient not taking: Reported on 03/25/2016)    No facility-administered encounter medications on file as of 03/25/2016.     Functional Status:   In your present state of health, do you have any difficulty performing the following activities: 12/11/2015 09/20/2015  Hearing? N N  Vision? N Y  Difficulty concentrating or making decisions? Y N  Walking or climbing stairs? Y Y  Dressing or bathing? N N  Doing errands, shopping? Y N  Preparing Food and eating ? N N  Using the Toilet? N N  In the past six months, have you accidently leaked urine? N Y  Do you have problems with loss of bowel control? N N  Managing your Medications? Y -  Managing your Finances? N N  Housekeeping or managing your Housekeeping? Alyssa [name]  Some recent data might be hidden    Fall/Depression Screening:    PHQ 2/9 Scores 10/01/2015 09/20/2015 08/07/2015 05/31/2015  PHQ - 2 Score 6 5 1 1   PHQ- 9 Score 14 8 - -  Exception Documentation - (No Data) - -   Fall Risk  12/11/2015 11/01/2015 10/01/2015 09/20/2015 08/07/2015  Falls in the past year? Yes No No No No  Number falls in past yr: 1 - - - -  Injury with Fall? No - -  - -  Risk for fall due to : History of fall(s) Impaired vision Impaired balance/gait Impaired balance/gait -  Risk for fall due to (comments): - - - occasionlly uses a cane -  Follow up Falls prevention discussed - - - -   Assessment:  Routine home visit   Discussed importance of taking annual Flu shot patients states she is thinking about it.   Diabetes - CBG meter reviewed , has not checked blood sugar in the last 30 days, some improvement in taking insulin nightly per report.   Blood sugar checked during visit reading 300, patient recently ate breakfast of pancakes,sausage. Will benefit from continued education and support of diabetes.    Recent fall - Benefit review of  fall prevention measures  Constipation - taking miralax, passing gas, denies abdominal pain or nausea, last BM 3 days ago. Discussed eating prunes, fresh fruit foods that help to have bowel movement .   Depression - Will benefit from scheduled appointment with therapist at [facility] .   Plan:  Will plan home visit in the next month to continue education and support of diabetes.  Will route this visit note to PCP. Reviewed fall prevention measures, encouraged patient to use her cane, and reacher . Patient will notify MD of worsening problem with constipation  Reviewed patient centered goals and care plan.   [name], RN, [place] Management [phone]- Mobile [phone]- Toll Free Main Office

## 2016-03-26 ENCOUNTER — Ambulatory Visit: Payer: PPO | Admitting: Vascular Surgery

## 2016-03-26 ENCOUNTER — Encounter (HOSPITAL_COMMUNITY): Payer: PPO

## 2016-04-09 ENCOUNTER — Ambulatory Visit (HOSPITAL_COMMUNITY): Payer: PPO | Admitting: Licensed Clinical Social Worker

## 2016-04-10 ENCOUNTER — Ambulatory Visit: Payer: Self-pay | Admitting: Pharmacist

## 2016-04-10 ENCOUNTER — Other Ambulatory Visit: Payer: Self-pay | Admitting: *Deleted

## 2016-04-11 ENCOUNTER — Other Ambulatory Visit: Payer: Self-pay | Admitting: Pharmacist

## 2016-04-11 NOTE — Patient Outreach (Signed)
[place] [facility]) Care Management  04/11/2016  LYRICK VALIS 1936-04-04 [id number]  Unsuccessful outreach attempt to patient, no answer.  Attempting to follow-up with patient on her medication adherence/management.   Plan:  Will make another outreach attempt next week to patient.   Karrie Meres, PharmD, [place] [phone]

## 2016-04-15 ENCOUNTER — Other Ambulatory Visit: Payer: Self-pay | Admitting: *Deleted

## 2016-04-15 NOTE — Patient Outreach (Signed)
[facility]) Care Management  [place]  04/15/2016   Alyssa [name] 1936/01/28 [id number]      CSW contacted patient today for follow up- "We celebrated our 59th anniversary on the 24tth" She also reports she missed her mental health appointment but plans to consider rescheduling-, " I am feeling better since my kids have been coming around."  She reports her spirits and depression have improved.  "we had a really nice visit and enjoyed having them".  CSW will plan follow up call in about 2 weeks for update and possible case closure.       Eduard Clos, MSW, [place] Worker  [place] [phone]

## 2016-04-17 ENCOUNTER — Other Ambulatory Visit: Payer: Self-pay | Admitting: *Deleted

## 2016-04-17 ENCOUNTER — Encounter: Payer: Self-pay | Admitting: Pharmacist

## 2016-04-17 ENCOUNTER — Other Ambulatory Visit: Payer: Self-pay | Admitting: Pharmacist

## 2016-04-17 DIAGNOSIS — L03031 Cellulitis of right toe: Secondary | ICD-10-CM | POA: Diagnosis not present

## 2016-04-17 DIAGNOSIS — M79671 Pain in right foot: Secondary | ICD-10-CM | POA: Diagnosis not present

## 2016-04-17 NOTE — Patient Outreach (Signed)
[facility]) Care Management  04/17/2016  Alyssa [name] [birth year] [id number]   RN received call from patient for advise. Per patient she had called her PCP office and was trying to see the nurse practitioner. Per patient she had bumped her toe. It is red up her foot, tender and swollen . Per patient the Dr office couldn't see her today and she wanted to know what to do. RN asked if she had called her podiatrist that she had been seeing. Per patient no because she had received a bill that she didn't think was hers. RN explained she could discuss that with them later. For now RN explained that she could call them and see if they could see her today or go to an urgent care or hospital. RN also asked if she had asked her PCP office if she could come in first thing tomorrow and she stated no.   Plan: Patient will call the podiatrist and see if they can see her now.  If unable to see her today. Patient will go to an urgent care or ER RN called patient community RN [name] and informed her of the above [name] is going to follow up with patient today also.  [place] Care Management [phone]

## 2016-04-17 NOTE — Patient Outreach (Signed)
[place] [facility]) Care Management  04/17/2016  SEVANAH BARDON 1935-12-18 [id number]   Placed call to patient to follow up regarding incoming call from Johny Shock, [place] , that patient called her for advise regarding concern related to her foot being red and swollen after she bumped her foot  on today.No answer with phone call to patient , phone just rang unable to leave a message.  Placed call to Thurston Pounds, daughter listed on [facility] consent, [employer] information verified. I discussed reason for call to verifiy I was dialing the correct phone number. Neoma Laming discussed her recent concern regarding her parents and safety, with driving ,she discussed Mrs.Tarleton missed appointments with Behavioral health and vascular doctor. Neoma Laming discussed that they got lost when trying to get to vascular visit and missed appointment. Neoma Laming states that family will start providing transportation to out of town appointments and encouraged patient to make appointments on Monday's.   Plan Will plan return follow up call to patient on next day.  [name], RN, [place] Management [phone]- Mobile [phone]- Toll Free Main Office

## 2016-04-17 NOTE — Patient Outreach (Signed)
[place] [facility]) Care Management  04/17/2016  Alyssa [name] Mar 05, 1936 [id number]   Successful outreach phone call.  Pt denies any pharmacy needs or concerns at this time.   Pt still not taking metformin due to concern for B12 deficiency causing neuropathy.   Reminded patient to let PCP know when she makes any medication adjustments on her own.  Already routed previous note regarding this medication change to PCP on 03/21/2016.    Patient states she continues to take her medications from her "lazy susan" and declines wanting to use adherence aides such as blister packaging or pill planners    Plan:   Will close patient case as no current [facility] pharmacy needs.  Patient aware she can contact [facility] Pharmacist with future needs.    Karrie Meres, PharmD, [place] [phone]

## 2016-04-18 ENCOUNTER — Other Ambulatory Visit: Payer: Self-pay | Admitting: *Deleted

## 2016-04-18 NOTE — Patient Outreach (Signed)
[place] [facility]) Care Management  04/18/2016  Alyssa [name] 1935/07/30 [id number]   Follow up telephone call to patient. Alyssa [name], she reports she went to the emergency room in [facility] regarding her right toe that she bumped a few days ago , having increase in pain and redness at area. Patient reports she was started on an antibiotic for cellulitis. Patient reports she started taking keflex on last night, discussed importance of completing full prescription for relief.   Patient discussed she was just up eating breakfast now , forgot to check her blood sugar. Reinforced with patient importance of taking medications as prescribed and monitoring blood sugar.  Patient is to follow up with PCP, Alyssa [name] in the next week and states she will call to make appointment today.  Plan Will follow up with planned visit on next week.  Patient will make follow up PCP visit for one week.  Patient will complete full dose of antibiotic   [name], RN, [place] Care Management [phone]- Mobile [phone]- [place]

## 2016-04-21 DIAGNOSIS — Z6827 Body mass index (BMI) 27.0-27.9, adult: Secondary | ICD-10-CM | POA: Diagnosis not present

## 2016-04-21 DIAGNOSIS — Z23 Encounter for immunization: Secondary | ICD-10-CM | POA: Diagnosis not present

## 2016-04-21 DIAGNOSIS — L03031 Cellulitis of right toe: Secondary | ICD-10-CM | POA: Diagnosis not present

## 2016-04-21 DIAGNOSIS — M79604 Pain in right leg: Secondary | ICD-10-CM | POA: Diagnosis not present

## 2016-04-24 ENCOUNTER — Encounter: Payer: Self-pay | Admitting: *Deleted

## 2016-04-24 ENCOUNTER — Other Ambulatory Visit: Payer: Self-pay | Admitting: *Deleted

## 2016-04-24 NOTE — Patient Outreach (Signed)
[place] [facility]) Care Management   04/24/2016  Alyssa [name] 03-15-1936 [id number]  Alyssa [name] is an 81 y.o. female  Subjective:  Patient discussed problem with redden areas on bilateral lower legs, and her recent problem of stumping toe on right foot and follow up at emergency room at [facility]. Patient discussed she has been taking  prescribed antibiotic a week ago . Patient discussed she has visited PCP and was prescribed to wear support hose.   Patient discussed missing recent appointment with vascular doctor because they got lost, but her appointment has been rescheduled and states her daughter is more involved now to help them with keeping up with appointments.   Patient discussed she is taking 25  units of insulin at night when she remembers to take it . Discussed problem of being forgetful.   Objective: BP 128/76 (BP Location: Left Arm, Patient Position: Sitting)   Pulse 95   Resp 18   SpO2 98%   Patient has her daily medications on lazy susan on kitchen table. Review of Systems  Constitutional: Negative.   HENT: Negative.   Eyes: Negative.   Respiratory: Negative.   Cardiovascular: Negative.   Gastrointestinal: Negative.   Genitourinary: Negative.   Musculoskeletal: Negative.   Skin: Negative.   Neurological: Negative.   Endo/Heme/Allergies: Negative.   Psychiatric/Behavioral: Positive for memory loss.       Reports more problems with forgetfullness     Physical Exam  Constitutional: She is oriented to person, place, and time. She appears well-developed and well-nourished.  Cardiovascular: Normal rate and normal heart sounds.   Respiratory: Effort normal and breath sounds normal.  GI: Soft.  Neurological: She is alert and oriented to person, place, and time.  Skin: Skin is warm and dry.     Slight Redness noted to bilateral lower legs skin intact.   Psychiatric: She has a normal mood and affect. Her behavior is normal. Judgment and thought  content normal.    Encounter Medications:   Outpatient Encounter Prescriptions as of 04/24/2016  Medication Sig Note  . aspirin 81 MG tablet Take 81 mg by mouth daily.   [name] atorvastatin (LIPITOR) 80 MG tablet Take 1 tablet (80 mg total) by mouth daily at 6 PM.   . carvedilol (COREG) 3.125 MG tablet Take 3.125 mg by mouth 2 (two) times daily with a meal.  04/02/2014: .  . clopidogrel (PLAVIX) 75 MG tablet Take 1 tablet (75 mg total) by mouth daily.   . Coenzyme Q10 (COQ10 PO) Take 1 capsule by mouth daily. Reported on 10/11/2015   . furosemide (LASIX) 20 MG tablet Take 20 mg by mouth daily.  04/02/2014: .  . insulin detemir (LEVEMIR) 100 UNIT/ML injection Inject 25 Units into the skin at bedtime.   [name] levothyroxine (SYNTHROID, LEVOTHROID) 125 MCG tablet Take 125 mcg by mouth daily.   [name] losartan (COZAAR) 50 MG tablet Take 1 tablet (50 mg total) by mouth daily.   . nitroGLYCERIN (NITROSTAT) 0.4 MG SL tablet Place 0.4 mg under the tongue every 5 (five) minutes as needed for chest pain. Reported on 10/04/2015 04/02/2014: .  . polyethylene glycol (MIRALAX / GLYCOLAX) packet Take 17 g by mouth daily as needed (constipation). Reported on 10/04/2015   . spironolactone (ALDACTONE) 25 MG tablet Take 12.5 mg by mouth daily. Reported on 10/04/2015 02/29/2016: Patient reports taking 50 mg---1/2 tab daily.    [name] ZETIA 10 MG tablet TAKE 1 TABLET BY MOUTH EVERY DAY   . amitriptyline (ELAVIL)  10 MG tablet Take 10 mg by mouth at bedtime as needed for sleep. Reported on 10/04/2015 11/01/2015: Does not take on a regular basis  . buPROPion (WELLBUTRIN XL) 150 MG 24 hr tablet Take 150 mg by mouth daily. Reported on 11/01/2015 12/11/2015: Not taking  . diclofenac sodium (VOLTAREN) 1 % GEL Apply 2 g topically daily as needed (pain). Reported on 11/01/2015 02/25/2016: Not taking  . gabapentin (NEURONTIN) 600 MG tablet Take 600 mg by mouth 3 (three) times daily. Reported on 11/01/2015   . glimepiride (AMARYL) 4 MG tablet Take 4 mg by  mouth daily with breakfast. Reported on 11/01/2015 12/11/2015: Not taking  . HOMEOPATHIC PRODUCTS PO Take 1 tablet by mouth as needed. hylands leg cramps takes as needed   . levothyroxine (SYNTHROID, LEVOTHROID) 125 MCG tablet Take 125 mcg by mouth daily before breakfast.    . meloxicam (MOBIC) 15 MG tablet Take 15 mg by mouth daily. Reported on 11/01/2015 02/25/2016: Has if needed  . metFORMIN (GLUCOPHAGE) 500 MG tablet Take 1 tablet (500 mg total) by mouth 2 (two) times daily with a meal. (Patient not taking: Reported on 04/24/2016)    No facility-administered encounter medications on file as of 04/24/2016.     Functional Status:   In your present state of health, do you have any difficulty performing the following activities: 04/24/2016 12/11/2015  Hearing? N N  Vision? N N  Difficulty concentrating or making decisions? [name]  Walking or climbing stairs? Y Y  Dressing or bathing? N N  Doing errands, shopping? [name]  Preparing Food and eating ? N N  Using the Toilet? N N  In the past six months, have you accidently leaked urine? N N  Do you have problems with loss of bowel control? N N  Managing your Medications? Y Y  Managing your Finances? N N  Housekeeping or managing your Housekeeping? [name]  Some recent data might be hidden    Fall/Depression Screening:    PHQ 2/9 Scores 04/15/2016 10/01/2015 09/20/2015 08/07/2015 05/31/2015  PHQ - 2 Score _0 PHQ- 9 Score _1 - -  Exception Documentation - - (No Data) - -    Assessment:  Routine home visit  Diabetes-  Not checking blood sugar only 1 reading noted on meter in last 30 days, Not consistently taking insulin per report. Blood sugar at visit ,325  patient has just completed breakfast . Did not take insulin last night . Will benefit from continued reinforcement of diabetes education management . Patient has declined attending Diabetes education classes, states she know what to do.  Right foot toe injury-  Patient to complete antibiotic  today.  Pt concern regarding red area on lower legs and pain  Patient has purchased support hose as suggested by MD and reports wearing,off at this visit.  Patient has follow up appointments one at [facility] on 1/15 for ultrasound of lower legs, patient's husband states he will be able to drive her to this appointment as well at 2/7 to [place] , vascular ,  but she will ask her daughter regarding how she will be able to help them.  Patient also missed her appointment with counselor and is concerning whether she wants to reschedule    Plan:  Will continue to follow for Diabetes education and management . Will reschedule home visit for the next month Patient will complete antibiotic on today Patient will notify MD of worsening symptoms  Patient will attend  upcoming medical appointments, dates of visit placed on refrigerator for reminder.   Will send this note as a quarterly update : Patient barriers continue to be , Medication adherence, Lack of  diabetes monitoring .   [facility] CM Care Plan Problem One   Flowsheet Row Most Recent Value  Care Plan Problem One  Knowledge deficit related Diabetes self care management as evidenced by elevated blood sugar readings   Role Documenting the Problem One  Care Management [place] for Problem One  Active  THN Long Term Goal (31-90 days)  Patient will report increase knowledge of Diabetes self care management in the next 60 days [goal restated ]  THN Long Term Goal Start Date  03/25/16 [goal date restart as of 12/12 ]  Interventions for Problem One Long Term Goal  Viewed EMMI video on High A1c, reinforced how taking medications , checking blood sugar, attending all Medical appointments  work toward controlling diabetes   THN CM Short Term Goal #1 (0-30 days)  Patient will be able to demonstrate checking blood sugar at least 3 days a week  and recording in the next 30 days  THN CM Short Term Goal #1 Start Date  03/21/16 [goal date restarted]    Interventions for Short Term Goal #1  Allowed patient to demonstrate checking blood sugar, completed without assistance,   THN CM Short Term Goal #2 (0-30 days)  Patient will begin walking at least 10 minutes in home at least 3 days a week in the next 30 days  THN CM Short Term Goal #2 Start Date  09/20/15  [facility] CM Short Term Goal #2 Met Date  10/11/15  THN CM Short Term Goal #3 (0-30 days)  Patient will get strips from the pharmacy in the next 7 days  THN CM Short Term Goal #3 Start Date  09/20/15  [facility] CM Short Term Goal #3 Met Date  09/25/15  THN CM Short Term Goal #4 (0-30 days)  Patient will report increased knowledge of food choices and meal planning in the next 30 days   THN CM Short Term Goal #4 Start Date  03/25/16  Interventions for Short Term Goal #4  Reinforced education on food choices   THN CM Short Term Goal #5 (0-30 days)  Patient will make follow up appoinment with PCP in the next week   [facility] CM Short Term Goal #5 Start Date  04/18/16  [facility] CM Short Term Goal #5 Met Date  04/24/16     [name], RN, [place] Management [phone]- Mobile [phone]- [place]

## 2016-04-28 ENCOUNTER — Other Ambulatory Visit: Payer: Self-pay | Admitting: *Deleted

## 2016-04-28 NOTE — Patient Outreach (Signed)
[name] [facility]) Care Management  04/28/2016  Alyssa [name] 1935-06-04 [id number]  CSW called patient today and she reports, "doing fairly well".  She reports her mental health appointment was rescheduled for this week- "its at [facility]". Encouraged patient to open up with counselor about her depression,etc. She denies any further needs or concerns at this time- plan f/u call next week.    Eduard Clos, MSW, [place] Worker  [place] [phone]

## 2016-05-01 ENCOUNTER — Other Ambulatory Visit: Payer: Self-pay | Admitting: Pharmacist

## 2016-05-01 ENCOUNTER — Ambulatory Visit (HOSPITAL_COMMUNITY): Payer: Self-pay | Admitting: Licensed Clinical Social Worker

## 2016-05-01 NOTE — Patient Outreach (Signed)
[place] [facility]) Care Management  05/01/2016  Alyssa [name] 26-Nov-1935 [id number]  Received an in-basket message from [place] regarding patient follow-up needed for manufacturer patient assistance from [employer].   This patient is familiar to me from previous [place] work.    Successful phone outreach to patient, HIPAA details verified.    Patient reports she received a letter from [employer], she doesn't know where letter is at time of call, but she thinks she needs to re-apply.   [employer] patient assistance appears to offer Zetia (ezetimibe) for eligible patients via its patient assistance program.  She had received it from [employer] patient assistance in 2017.    Discussed patient can re-apply to see if she is eligible for 2018, and she could take her completed application to appointment with Dr Radford Pax the end of this month.  Plan:  Will send patient [employer] patient assistance application via mail.   Will place follow-up call to patient in the next 2 weeks.   Karrie Meres, PharmD, [place] [phone]

## 2016-05-02 ENCOUNTER — Encounter: Payer: Self-pay | Admitting: Pharmacist

## 2016-05-07 ENCOUNTER — Ambulatory Visit: Payer: Self-pay | Admitting: *Deleted

## 2016-05-08 ENCOUNTER — Encounter (HOSPITAL_COMMUNITY): Payer: PPO

## 2016-05-08 ENCOUNTER — Other Ambulatory Visit: Payer: Self-pay | Admitting: *Deleted

## 2016-05-08 ENCOUNTER — Ambulatory Visit: Payer: PPO | Admitting: Vascular Surgery

## 2016-05-08 NOTE — Patient Outreach (Signed)
[place] [facility]) Care Management  [facility] Social Work  05/08/2016  Alyssa [name] Sep 22, 1935 [id number]  Subjective:  "The snow came and I had to cancel my appoiintment".  Objective: Patient to receive outpatient therapy for depression.  Current Medications:  Current Outpatient Prescriptions  Medication Sig Dispense Refill  . amitriptyline (ELAVIL) 10 MG tablet Take 10 mg by mouth at bedtime as needed for sleep. Reported on 10/04/2015    . aspirin 81 MG tablet Take 81 mg by mouth daily.    [name] atorvastatin (LIPITOR) 80 MG tablet Take 1 tablet (80 mg total) by mouth daily at 6 PM. 30 tablet 11  . buPROPion (WELLBUTRIN XL) 150 MG 24 hr tablet Take 150 mg by mouth daily. Reported on 11/01/2015    . carvedilol (COREG) 3.125 MG tablet Take 3.125 mg by mouth 2 (two) times daily with a meal.     . clopidogrel (PLAVIX) 75 MG tablet Take 1 tablet (75 mg total) by mouth daily. 90 tablet 3  . Coenzyme Q10 (COQ10 PO) Take 1 capsule by mouth daily. Reported on 10/11/2015    . diclofenac sodium (VOLTAREN) 1 % GEL Apply 2 g topically daily as needed (pain). Reported on 11/01/2015    . furosemide (LASIX) 20 MG tablet Take 20 mg by mouth daily.     [name] gabapentin (NEURONTIN) 600 MG tablet Take 600 mg by mouth 3 (three) times daily. Reported on 11/01/2015  5  . glimepiride (AMARYL) 4 MG tablet Take 4 mg by mouth daily with breakfast. Reported on 11/01/2015    . HOMEOPATHIC PRODUCTS PO Take 1 tablet by mouth as needed. hylands leg cramps takes as needed    . insulin detemir (LEVEMIR) 100 UNIT/ML injection Inject 25 Units into the skin at bedtime.    [name] levothyroxine (SYNTHROID, LEVOTHROID) 125 MCG tablet Take 125 mcg by mouth daily before breakfast.     . levothyroxine (SYNTHROID, LEVOTHROID) 125 MCG tablet Take 125 mcg by mouth daily.    [name] losartan (COZAAR) 50 MG tablet Take 1 tablet (50 mg total) by mouth daily. 90 tablet 3  . meloxicam (MOBIC) 15 MG tablet Take 15 mg by mouth daily. Reported on  11/01/2015    . metFORMIN (GLUCOPHAGE) 500 MG tablet Take 1 tablet (500 mg total) by mouth 2 (two) times daily with a meal. (Patient not taking: Reported on 04/24/2016) 60 tablet 11  . nitroGLYCERIN (NITROSTAT) 0.4 MG SL tablet Place 0.4 mg under the tongue every 5 (five) minutes as needed for chest pain. Reported on 10/04/2015    . polyethylene glycol (MIRALAX / GLYCOLAX) packet Take 17 g by mouth daily as needed (constipation). Reported on 10/04/2015    . spironolactone (ALDACTONE) 25 MG tablet Take 12.5 mg by mouth daily. Reported on 10/04/2015    . ZETIA 10 MG tablet TAKE 1 TABLET BY MOUTH EVERY DAY 30 tablet 6   No current facility-administered medications for this visit.     Functional Status:  In your present state of health, do you have any difficulty performing the following activities: 04/24/2016 12/11/2015  Hearing? N N  Vision? N N  Difficulty concentrating or making decisions? [name]  Walking or climbing stairs? Y Y  Dressing or bathing? N N  Doing errands, shopping? [name]  Preparing Food and eating ? N N  Using the Toilet? N N  In the past six months, have you accidently leaked urine? N N  Do you have problems with loss of bowel control? N  N  Managing your Medications? Y Y  Managing your Finances? N N  Housekeeping or managing your Housekeeping? [name]  Some recent data might be hidden    Fall/Depression Screening:  PHQ 2/9 Scores 05/08/2016 04/15/2016 10/01/2015 09/20/2015 08/07/2015 05/31/2015  PHQ - 2 Score 2 2 6 5 1 1   PHQ- 9 Score 6 7 14 8  - -  Exception Documentation - - - (No Data) - -    Assessment:  CSW had a long phone visit with patient today. She is in good spirits and reports a fall without injury this past weekend while in [place]. "My knees took it the worst". She denies any injury or impairment- states she amy go walk outside later.  Discussed her [facility] outpatient appointment for 1/18 and she reports she had to cancel it because of the snow. She plans to reschedule by  calling today.    Plan: CSW assessed depression and overall well-being; no SI or severe depression. Discussed seasonal depression and will plan follow up call next month for updates.    [facility] CM Care Plan Problem One   Flowsheet Row Most Recent Value  Care Plan Problem One  Patient with depression [Signs/symptoms of depression]  Role Documenting the Problem One  Clinical Social Worker  Care Plan for Problem One  Active  THN Long Term Goal (31-90 days)  Patient will be seen by a counselor/therapist within the next 90 days.  THN Long Term Goal Start Date  10/01/15  THN Long Term Goal Met Date  05/08/16  Interventions for Problem One Long Term Goal  CSW discusswd signs and symptoms related to depressoin as well as her PHQ9 score. CSW to assist patient with getting mentla health outpatient links for therapy.   THN CM Short Term Goal #1 (0-30 days)  Patient will recieve information on depression and review within the next 30 days.   THN CM Short Term Goal #1 Start Date  10/01/15  [facility] CM Short Term Goal #1 Met Date  05/08/16  Interventions for Short Term Goal #1  CSW to send EMMI and arrange for outpatient appointment for mental health/depression counseling.   THN CM Short Term Goal #2 (0-30 days)  Patient will discuss anit-depresanst with PCP within the next 30 days.  THN CM Short Term Goal #2 Start Date  10/01/15  Interventions for Short Term Goal #2  CSW discussed RX for anti-depresant may be beneficial (at lower dose than prescribed).  THN CM Short Term Goal #3 (0-30 days)  Patient will reschedule [facility] appointment for treatment in the next 30 days.  THN CM Short Term Goal #3 Start Date  05/08/16  Interventions for Short Tern Goal #3  CSW discussed and encouraged paitent to call and reschedule [facility] outpatient therapy .        Eduard Clos, MSW, [place] Worker  [place] [phone]

## 2016-05-09 ENCOUNTER — Other Ambulatory Visit: Payer: Self-pay | Admitting: Pharmacist

## 2016-05-09 NOTE — Patient Outreach (Signed)
[place] [facility]) Care Management  05/09/2016  ZYASIA KRITZER September 23, 1935 [id number]  Successful phone outreach to patient, HIPAA details verified.   Patient reports she received [employer] patient assistance application in mail.  Purpose of call to follow-up on patient assistance application.   Patient was reminded she can take completed application to appointment with Dr Radford Pax on 05/14/16 for prescriber completion and application can then be mailed to [employer] patient assistance program.  Patient agrees to take application to cardiology appointment next week.    Plan:  Will continue to follow-up with patient on application status for [employer] patient assistance for ezetimibe (Zetia).    Karrie Meres, PharmD, [place] [phone]

## 2016-05-14 ENCOUNTER — Encounter: Payer: Self-pay | Admitting: Cardiology

## 2016-05-14 ENCOUNTER — Ambulatory Visit (INDEPENDENT_AMBULATORY_CARE_PROVIDER_SITE_OTHER): Payer: PPO | Admitting: Cardiology

## 2016-05-14 VITALS — BP 114/64 | HR 92 | Ht 61.5 in | Wt 151.0 lb

## 2016-05-14 DIAGNOSIS — I5032 Chronic diastolic (congestive) heart failure: Secondary | ICD-10-CM | POA: Diagnosis not present

## 2016-05-14 DIAGNOSIS — I2510 Atherosclerotic heart disease of native coronary artery without angina pectoris: Secondary | ICD-10-CM

## 2016-05-14 DIAGNOSIS — I10 Essential (primary) hypertension: Secondary | ICD-10-CM | POA: Diagnosis not present

## 2016-05-14 DIAGNOSIS — E7800 Pure hypercholesterolemia, unspecified: Secondary | ICD-10-CM

## 2016-05-14 NOTE — Patient Instructions (Signed)
Medication Instructions:  Your physician recommends that you continue on your current medications as directed. Please refer to the Current Medication list given to you today.   Labwork: Your physician recommends that you return for FASTING lab work.   Testing/Procedures: None  Follow-Up: Your physician wants you to follow-up in: 6 months with Dr. Theodosia Blender assistant. You will receive a reminder letter in the mail two months in advance. If you don't receive a letter, please call our office to schedule the follow-up appointment.   Your physician wants you to follow-up in: 1 year with Dr. Radford Pax. You will receive a reminder letter in the mail two months in advance. If you don't receive a letter, please call our office to schedule the follow-up appointment.   Any Other Special Instructions Will Be Listed Below (If Applicable).     If you need a refill on your cardiac medications before your next appointment, please call your pharmacy.

## 2016-05-14 NOTE — Progress Notes (Signed)
Cardiology Office Note    Date:  05/14/2016   ID:  [name], Alyssa August 02, 1935, MRN [id number]  PCP:  Philmore Pali, NP  Cardiologist:  Fransico Him, MD   Chief Complaint  Patient presents with  . Coronary Artery Disease  . Hypertension  . Hyperlipidemia    History of Present Illness:  Alyssa [name] is a 81 y.o. female  with history of CAD (DES to LAD 2013, NSTEMI s/p DES to OM1 and DES to RCA 03/2014) , HTN, chronic diastolic CHF, GERD, hypothyroidism, DM and PVD who presents for followup.  She has had 1 episode of chest pain a few days ago that lasted and took 1SL NTG and it resolved.  She has not had any other episodes.  She occasionally has some LE edema that is sporadic. She denies any palptiations.  She still has some dizziness that she describes as a swimmy headed feeling but has not had any syncope.  She has noticed some SOB but says that she has had this before and has not changed any since I saw her last.    Past Medical History:  Diagnosis Date  . Anemia   . CAD (coronary artery disease)    a. 2013 NSTEMI s/p DES to LAD. b. 04/03/14: NSTEMI s/p DES to OM1, DES to dRCA.  [name] Chronic diastolic CHF (congestive heart failure) ([place])    a. 2D ECHO 04/02/14 with hypokinesis in the basal inferior and inferoseptal walls. Focal and moderate concentric LVH. Overall LVEF 60-65%. G1DD. Elevated EDLV filling pressures. Mild TR.  [name] GERD (gastroesophageal reflux disease)   . History of GI bleed    a. 2013: adm for ABL anemia. 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  [name] HLD (hyperlipidemia)   . HTN (hypertension)   . Hypothyroid   . Peripheral vascular disease ([place])   . Refusal of blood transfusions as patient is Jehovah's Witness 09-29-12  . Type II diabetes mellitus ([place])     Past Surgical History:  Procedure Laterality Date  . CESAREAN SECTION  ~ 1961; 1966   plus 3 NVD  .  COLONOSCOPY  02/27/2012   Procedure: COLONOSCOPY;  Surgeon: Lear Ng, MD;  Location[facility] ENDOSCOPY;  Service: Endoscopy;  Laterality: N/A;  . CORONARY ANGIOPLASTY WITH STENT PLACEMENT  05/2011; 04/03/2014  . ESOPHAGOGASTRODUODENOSCOPY  02/26/2012   Procedure: ESOPHAGOGASTRODUODENOSCOPY (EGD);  Surgeon: Lear Ng, MD;  Location[facility] ENDOSCOPY;  Service: Endoscopy;  Laterality: N/A;  . LEFT HEART CATHETERIZATION WITH CORONARY ANGIOGRAM N/A 06/12/2011   Procedure: LEFT HEART CATHETERIZATION WITH CORONARY ANGIOGRAM;  Surgeon: Josue Hector, MD;  Location: [facility] CATH LAB;  Service: Cardiovascular;  Laterality: N/A;  . LEFT HEART CATHETERIZATION WITH CORONARY ANGIOGRAM N/A 04/03/2014   Procedure: LEFT HEART CATHETERIZATION WITH CORONARY ANGIOGRAM;  Surgeon: Jettie Booze, MD;  Location: [facility] C[facility] LAB;  Service: Cardiovascular;  Laterality: N/A;  . TONSILLECTOMY    . VAGINAL HYSTERECTOMY      Current Medications: Outpatient Medications Prior to Visit  Medication Sig Dispense Refill  . aspirin 81 MG tablet Take 81 mg by mouth daily.    [name] atorvastatin (LIPITOR) 80 MG tablet Take 1 tablet (80 mg total) by mouth daily at 6 PM. 30 tablet 11  . carvedilol (COREG) 3.125 MG tablet Take 3.125 mg by mouth 2 (two) times daily with a meal.     . clopidogrel (PLAVIX) 75 MG tablet  Take 1 tablet (75 mg total) by mouth daily. 90 tablet 3  . Coenzyme Q10 (COQ10 PO) Take 1 capsule by mouth daily. Reported on 10/11/2015    . furosemide (LASIX) 20 MG tablet Take 20 mg by mouth daily.     . insulin detemir (LEVEMIR) 100 UNIT/ML injection Inject 25 Units into the skin at bedtime.    [name] levothyroxine (SYNTHROID, LEVOTHROID) 125 MCG tablet Take 125 mcg by mouth daily.    [name] losartan (COZAAR) 50 MG tablet Take 1 tablet (50 mg total) by mouth daily. 90 tablet 3  . nitroGLYCERIN (NITROSTAT) 0.4 MG SL tablet Place 0.4 mg under the tongue every 5 (five) minutes as needed for chest pain. Reported on 10/04/2015      . polyethylene glycol (MIRALAX / GLYCOLAX) packet Take 17 g by mouth daily as needed (constipation). Reported on 10/04/2015    . ZETIA 10 MG tablet TAKE 1 TABLET BY MOUTH EVERY DAY 30 tablet 6  . spironolactone (ALDACTONE) 25 MG tablet Take 12.5 mg by mouth daily. Reported on 10/04/2015    . glimepiride (AMARYL) 4 MG tablet Take 4 mg by mouth daily with breakfast. Reported on 11/01/2015    . levothyroxine (SYNTHROID, LEVOTHROID) 125 MCG tablet Take 125 mcg by mouth daily before breakfast.     . metFORMIN (GLUCOPHAGE) 500 MG tablet Take 1 tablet (500 mg total) by mouth 2 (two) times daily with a meal. (Patient not taking: Reported on 04/24/2016) 60 tablet 11  . amitriptyline (ELAVIL) 10 MG tablet Take 10 mg by mouth at bedtime as needed for sleep. Reported on 10/04/2015    . buPROPion (WELLBUTRIN XL) 150 MG 24 hr tablet Take 150 mg by mouth daily. Reported on 11/01/2015    . diclofenac sodium (VOLTAREN) 1 % GEL Apply 2 g topically daily as needed (pain). Reported on 11/01/2015    . gabapentin (NEURONTIN) 600 MG tablet Take 600 mg by mouth 3 (three) times daily. Reported on 11/01/2015  5  . HOMEOPATHIC PRODUCTS PO Take 1 tablet by mouth as needed. hylands leg cramps takes as needed    . meloxicam (MOBIC) 15 MG tablet Take 15 mg by mouth daily. Reported on 11/01/2015     No facility-administered medications prior to visit.      Allergies:   Tetanus toxoid, adsorbed and Tetanus toxoids   Social History   Social History  . Marital status: Married    Spouse name: N/A  . Number of children: N/A  . Years of education: N/A   Social History Main Topics  . Smoking status: Former Smoker    Packs/day: 1.50    Years: 16.00    Types: Cigarettes    Quit date: 03/15/1970  . Smokeless tobacco: Never Used  . Alcohol use 1.8 - 2.4 oz/week    3 - 4 Standard drinks or equivalent per week     Comment: 04/03/2014 "glass of wine or a beer 2-3 times/month"  . Drug use: No  . Sexual activity: Not Currently    Other Topics Concern  . None   Social History Narrative  . None     Family History:  The patient's family history includes Arthritis in her father; Heart disease in her mother; Varicose Veins in her mother.   ROS:   Please see the history of present illness.    ROS All other systems reviewed and are negative.  No flowsheet data found.     PHYSICAL EXAM:   VS:  BP 114/64 (BP Location:  Right Arm, Patient Position: Sitting, Cuff Size: Normal)   Pulse 92   Ht 5' 1.5" (1.562 m)   Wt 151 lb (68.5 kg)   BMI 28.07 kg/m    GEN: Well nourished, well developed, in no acute distress  HEENT: normal  Neck: no JVD, carotid bruits, or masses Cardiac: RRR; no murmurs, rubs, or gallops,no edema.  Intact distal pulses bilaterally.  Respiratory:  clear to auscultation bilaterally, normal work of breathing GI: soft, nontender, nondistended, + BS MS: no deformity or atrophy  Skin: warm and dry, no rash Neuro:  Alert and Oriented x 3, Strength and sensation are intact Psych: euthymic mood, full affect  Wt Readings from Last 3 Encounters:  05/14/16 151 lb (68.5 kg)  09/24/15 158 lb 3.2 oz (71.8 kg)  01/26/15 161 lb 12.8 oz (73.4 kg)      Studies/Labs Reviewed:   EKG:  EKG is not ordered today.    Recent Labs: 09/24/2015: ALT 15; BUN 11; Creat 0.66; Potassium 4.2; Sodium 140   Lipid Panel    Component Value Date/Time   CHOL 130 09/24/2015 1135   TRIG 202 (H) 09/24/2015 1135   HDL 43 (L) 09/24/2015 1135   CHOLHDL 3.0 09/24/2015 1135   VLDL 40 (H) 09/24/2015 1135   LDLCALC 47 09/24/2015 1135   LDLDIRECT 99.0 06/02/2014 1203    Additional studies/ records that were reviewed today include:  none    ASSESSMENT:    1. Coronary artery disease involving native coronary artery of native heart without angina pectoris   2. Chronic diastolic CHF (congestive heart failure) ([place])   3. Essential hypertension   4. Pure hypercholesterolemia      PLAN:  In order of problems  listed above:  1. ASCAD - s/p NSTEMI s/p DES to LAD in 2013 and then NSTEMI s/p DES to OM1, DES to RCA 03/2014.  She has only had one anginal episode since I saw her last and was relieved with 1 SL NTG.  She will continue on ASA/statin/BB. I have asked her to call me if she has any further episodes of chest pain.  2. Chronic diastolic CHF - she appears euvolemic on exam and weight is stable and she has actually lost 7lbs. She will continue on ARB/BB and aldactone.  Check BMET> 3. HTN - BP controlled on current meds.  Continue BB and ARB. 4. Hyperlipidemia with LDL goal < 70.  Continue statin.zetia. Check FLP and ALT.    Medication Adjustments/Labs and Tests Ordered: Current medicines are reviewed at length with the patient today.  Concerns regarding medicines are outlined above.  Medication changes, Labs and Tests ordered today are listed in the Patient Instructions below.  There are no Patient Instructions on file for this visit.   Signed, Fransico Him, MD  05/14/2016 2:20 PM    [facility] [place], [place], [place]  [address] Phone: [phone]; Fax: [phone]

## 2016-05-15 ENCOUNTER — Other Ambulatory Visit: Payer: Self-pay

## 2016-05-16 ENCOUNTER — Encounter: Payer: Self-pay | Admitting: Vascular Surgery

## 2016-05-16 ENCOUNTER — Other Ambulatory Visit: Payer: Self-pay | Admitting: Pharmacist

## 2016-05-16 NOTE — Patient Outreach (Signed)
[name] Care Management  05/16/2016  Alyssa [name] 10/07/1935 [id number]  Follow-up phone call to see if patient took [employer] Patient Assistance application to Dr Theodosia Blender office (cardiology) for completion.   Patient states she has completed application with her and isn't sure what to do with it.  Patient states she still has THN envelope that was mailed with application.   Counseled patient to mail application for [employer] patient assistance to [place] can send application to [facility] patient assistance.  Plan:  Will continue to follow-up patient assistance status and will need to wait for patient to send application to [facility] via mail.    Karrie Meres, PharmD, [place] [phone]

## 2016-05-21 ENCOUNTER — Ambulatory Visit (HOSPITAL_COMMUNITY)
Admission: RE | Admit: 2016-05-21 | Discharge: 2016-05-21 | Disposition: A | Discharge status: Home / Self Care | Payer: PPO | Source: Ambulatory Visit | Attending: Vascular Surgery | Admitting: Vascular Surgery

## 2016-05-21 ENCOUNTER — Encounter: Payer: Self-pay | Admitting: Vascular Surgery

## 2016-05-21 ENCOUNTER — Ambulatory Visit (INDEPENDENT_AMBULATORY_CARE_PROVIDER_SITE_OTHER): Payer: PPO | Admitting: Vascular Surgery

## 2016-05-21 VITALS — BP 139/69 | HR 69 | Temp 96.6°F | Resp 16 | Ht 62.0 in | Wt 151.0 lb

## 2016-05-21 DIAGNOSIS — R9439 Abnormal result of other cardiovascular function study: Secondary | ICD-10-CM | POA: Insufficient documentation

## 2016-05-21 DIAGNOSIS — I70213 Atherosclerosis of native arteries of extremities with intermittent claudication, bilateral legs: Secondary | ICD-10-CM | POA: Insufficient documentation

## 2016-05-21 DIAGNOSIS — I739 Peripheral vascular disease, unspecified: Secondary | ICD-10-CM | POA: Diagnosis not present

## 2016-05-21 NOTE — Progress Notes (Signed)
Patient name: Alyssa [name] MRN: [id number] DOB: 06/10/1935 Sex: female  REASON FOR VISIT: Follow up of peripheral vascular disease.  HPI: Alyssa [name] is a 81 y.o. female who I saw in consultation on 01/26/2015 with peripheral vascular disease. Based on her exam and noninvasive studies she had evidence of infrainguinal arterial occlusive disease bilaterally. However she had stable claudication with no rest pain and no nonhealing ulcers. I recommended a one-year follow up visit.  Since I saw her last, she does have some mild bilateral lower extremity claudication mostly involves her calves. She denies any rest pain. She gets some cramps at night. She denies any nonhealing ulcers.  She occasionally gets some mild leg swelling.  Past Medical History:  Diagnosis Date  . Anemia   . CAD (coronary artery disease)    a. 2013 NSTEMI s/p DES to LAD. b. 04/03/14: NSTEMI s/p DES to OM1, DES to dRCA.  [name] Chronic diastolic CHF (congestive heart failure) ([place])    a. 2D ECHO 04/02/14 with hypokinesis in the basal inferior and inferoseptal walls. Focal and moderate concentric LVH. Overall LVEF 60-65%. G1DD. Elevated EDLV filling pressures. Mild TR.  [name] GERD (gastroesophageal reflux disease)   . History of GI bleed    a. 2013: adm for ABL anemia. 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  [name] HLD (hyperlipidemia)   . HTN (hypertension)   . Hypothyroid   . Peripheral vascular disease ([place])   . Refusal of blood transfusions as patient is Jehovah's Witness 09-29-12  . Type II diabetes mellitus (H[facility])     Family History  Problem Relation Age of Onset  . Arthritis Father   . Heart disease Mother   . Varicose Veins Mother   . Hypertension      SOCIAL HISTORY: Social History  Substance Use Topics  . Smoking status: Former Smoker    Packs/day: 1.50    Years: 16.00    Types: Cigarettes    Quit date:  03/15/1970  . Smokeless tobacco: Never Used  . Alcohol use 1.8 - 2.4 oz/week    3 - 4 Standard drinks or equivalent per week     Comment: 04/03/2014 "glass of wine or a beer 2-3 times/month"    Allergies  Allergen Reactions  . Tetanus Toxoid, Adsorbed Rash and Swelling  . Tetanus Toxoids Swelling and Rash    Current Outpatient Prescriptions  Medication Sig Dispense Refill  . aspirin 81 MG tablet Take 81 mg by mouth daily.    [name] atorvastatin (LIPITOR) 80 MG tablet Take 1 tablet (80 mg total) by mouth daily at 6 PM. 30 tablet 11  . carvedilol (COREG) 3.125 MG tablet Take 3.125 mg by mouth 2 (two) times daily with a meal.     . clopidogrel (PLAVIX) 75 MG tablet Take 1 tablet (75 mg total) by mouth daily. 90 tablet 3  . Coenzyme Q10 (COQ10 PO) Take 1 capsule by mouth daily. Reported on 10/11/2015    . furosemide (LASIX) 20 MG tablet Take 20 mg by mouth daily.     . insulin detemir (LEVEMIR) 100 UNIT/ML injection Inject 25 Units into the skin at bedtime.    [name] levothyroxine (SYNTHROID, LEVOTHROID) 125 MCG tablet Take 125 mcg by mouth daily.    [name] losartan (COZAAR) 50 MG tablet Take 1 tablet (50 mg total) by mouth daily. 90 tablet 3  . nitroGLYCERIN (NITROSTAT) 0.4 MG SL tablet Place 0.4  mg under the tongue every 5 (five) minutes as needed for chest pain. Reported on 10/04/2015    . polyethylene glycol (MIRALAX / GLYCOLAX) packet Take 17 g by mouth daily as needed (constipation). Reported on 10/04/2015    . spironolactone (ALDACTONE) 50 MG tablet daily.    [name] ZETIA 10 MG tablet TAKE 1 TABLET BY MOUTH EVERY DAY 30 tablet 6  . glimepiride (AMARYL) 4 MG tablet Take 4 mg by mouth daily with breakfast. Reported on 11/01/2015    . hydrOXYzine (VISTARIL) 25 MG capsule     . levothyroxine (SYNTHROID, LEVOTHROID) 125 MCG tablet Take 125 mcg by mouth daily before breakfast.     . metFORMIN (GLUCOPHAGE) 500 MG tablet Take 1 tablet (500 mg total) by mouth 2 (two) times daily with a meal. (Patient not taking:  Reported on 05/21/2016) 60 tablet 11   No current facility-administered medications for this visit.     REVIEW OF SYSTEMS:  [X]  denotes positive finding, [ ]  denotes negative finding Cardiac  Comments:  Chest pain or chest pressure: X   Shortness of breath upon exertion: X   Short of breath when lying flat:    Irregular heart rhythm:        Vascular    Pain in calf, thigh, or hip brought on by ambulation:    Pain in feet at night that wakes you up from your sleep:  X   Blood clot in your veins:    Leg swelling:  X       Pulmonary    Oxygen at home:    Productive cough:     Wheezing:         Neurologic    Sudden weakness in arms or legs:     Sudden numbness in arms or legs:     Sudden onset of difficulty speaking or slurred speech:    Temporary loss of vision in one eye:     Problems with dizziness:         Gastrointestinal    Blood in stool:     Vomited blood:         Genitourinary    Burning when urinating:     Blood in urine:        Psychiatric    Major depression:         Hematologic    Bleeding problems:    Problems with blood clotting too easily:        Skin    Rashes or ulcers:        Constitutional    Fever or chills:      PHYSICAL EXAM: Vitals:   05/21/16 1139  BP: 139/69  Pulse: 69  Resp: 16  Temp: (!) 96.6 F (35.9 C)  SpO2: 100%  Weight: 151 lb (68.5 kg)  Height: 5\' 2"  (1.575 m)    GENERAL: The patient is a well-nourished female, in no acute distress. The vital signs are documented above. CARDIAC: There is a regular rate and rhythm.  VASCULAR: I do not detect carotid bruits. She has palpable femoral pulses. I cannot palpate pedal pulses. She has mild bilateral lower extremity swelling. PULMONARY: There is good air exchange bilaterally without wheezing or rales. ABDOMEN: Soft and non-tender with normal pitched bowel sounds.  MUSCULOSKELETAL: There are no major deformities or cyanosis. NEUROLOGIC: No focal weakness or paresthesias are  detected. SKIN: There are no ulcers or rashes noted. She does have some hyperpigmentation bilaterally consistent with chronic venous insufficiency. PSYCHIATRIC: The patient  has a normal affect.  DATA:   LOWER EXTREMITY ARTERIAL DOPPLER STUDY: I have independently interpreted this patient's lower extremity arterial Doppler study. On the right side there monophasic Doppler signals in the dorsalis pedis and posterior tibial positions. ABI is 56%. On the left side there monophasic Doppler signals in the dorsalis pedis and posterior tibial positions. ABI 74%.  MEDICAL ISSUES:   BILATERAL INFRAINGUINAL ARTERIAL OCCLUSIVE DISEASE: This patient's symptoms are stable. Her ABIs are slightly improved. She has an ABI 56% on the right with a toe pressure 46 mmHg. On the left ABI 74% with a toe pressure of 25. I encouraged her to stay as active as possible. She is not a smoker. She is on aspirin and is on Plavix. Given her leg swelling and evidence of some chronic venous insufficiency, we have discussed the importance of leg elevation. I will order to follow up Doppler study in one year and I'll see her back at that time. She is to call sooner if she has problems.   Deitra Mayo [facility] [phone]

## 2016-05-27 ENCOUNTER — Encounter: Payer: Self-pay | Admitting: Pharmacist

## 2016-05-27 ENCOUNTER — Other Ambulatory Visit: Payer: Self-pay | Admitting: Pharmacist

## 2016-05-27 ENCOUNTER — Other Ambulatory Visit: Payer: Self-pay | Admitting: *Deleted

## 2016-05-27 NOTE — Patient Outreach (Signed)
[place] [facility]) Care Management  05/27/2016  Alyssa [name] Oct 31, 1935 [id number]  Successful outreach to patient, HIPAA details verified.    THN received patient's application for [employer] patient assistance back in the mail from patient.  She did not sign and date application.    Counseled patient she needs to sign and date application before it can be submitted.   Plan:  Will mail patient assistance application back to patient and continue to follow-up with her regarding patient assistance.   Karrie Meres, PharmD, [place] [phone]

## 2016-05-27 NOTE — Patient Outreach (Signed)
[place] [facility]) Care Management  05/27/2016  SHERRESE SANANTONIO June 10, 1935 [id number]   1230 Arrived for scheduled home visit , patient and spouse coming out of door stating they have an  appointment in [place]. Patient requested that I call back at a another time to reschedule visit.  Plan Will Place on schedule in next week  to call regarding rescheduling home visit .   [name], RN, [place] Management [phone]- Mobile [phone]- Toll Free Main Office

## 2016-05-28 ENCOUNTER — Encounter (HOSPITAL_COMMUNITY): Payer: PPO

## 2016-05-28 ENCOUNTER — Ambulatory Visit: Payer: PPO | Admitting: Vascular Surgery

## 2016-06-02 ENCOUNTER — Other Ambulatory Visit: Payer: Self-pay | Admitting: *Deleted

## 2016-06-02 NOTE — Patient Outreach (Signed)
[place] [facility]) Care Management  06/02/2016  LAURAL GEARTY 1935/11/11 [id number]   Telephone follow up  Spoke with patient reports she is doing fairly well. Patient discussed she is mostly taking her po medications daily, states she is not taking her insulin on a regular basis. She discussed she is not sure why is just can't get to taking that on a more regular basis . She did discuss she has been more busy, now looking into moving in [place] to be closer to medical appointments.  I discussed if she thought setting an alarm would help her and she states I doubt it.  Patient agreeable to home visit to assist with effort of continued diabetes education and management for elevated A1c greater than 12  Plan Schedule home visit for in the next week, with date and time patient agreeable to verified she had my contact information in case she needs to change visit.  Encouraged patient with benefits of controlling elevated blood sugar and reinforced importance of taking insulin .    [name], RN, [place] Management [phone]- Mobile [phone]- Toll Free Main Office

## 2016-06-03 NOTE — Addendum Note (Signed)
Addended by: Lianne Cure A on: 06/03/2016 03:22 PM   Modules accepted: Orders

## 2016-06-05 ENCOUNTER — Other Ambulatory Visit: Payer: Self-pay | Admitting: *Deleted

## 2016-06-05 NOTE — Patient Outreach (Signed)
[place] [facility]) Care Management  06/05/2016  Alyssa [name] 28-Jan-1936 [id number]   CSW spoke with patient reports she had dental work yesterday. She has not re-scheduled the outpatient mental health counseling but tells me she "may call today".  CSW encouraged her to make the appointment- she shared some emotions and thoughts she has had about "my childhood". CSW validated and commended her for recognition and acknowledgement of these emotions.   She denies any concerns or needs at this time- CSW plans f/u call in 1-2 weeks.    Eduard Clos, MSW, [place] Worker  [place] [phone]

## 2016-06-09 ENCOUNTER — Other Ambulatory Visit: Payer: Self-pay | Admitting: Pharmacist

## 2016-06-09 NOTE — Patient Outreach (Signed)
[facility]) Care Management  06/09/2016  Alyssa [name] [birth year] [id number]  1107:  Phone outreach to follow-up if patient received incomplete [employer] Patient Assistance application back in [name] answered and reported patient not available at time of call.   Will make another call this afternoon to patient.   1358:  Successful phone outreach to patient, HIPAA details verified.  Patient states she received [employer] Patient Assistance application and will sign/date it and mail back to [facility].  Patient denies questions/concerns at time of call.   Plan:  Will await return of [employer] Patient Assistance application from patient.   Karrie Meres, PharmD, [place] [phone]

## 2016-06-11 ENCOUNTER — Other Ambulatory Visit: Payer: Self-pay | Admitting: *Deleted

## 2016-06-11 NOTE — Patient Outreach (Signed)
[place] [facility]) Care Management  06/11/2016  KEEGHAN ANHORN 10-07-1935 [id number]   Arrived at patient home for scheduled visit , no answer at door,no car in driveway.  Placed call to home , no answer, phone just rang unable to leave a message.  Plan Will plan to return call to patient in the next week to reschedule visit and plan reminder call prior to next visit.    [name], RN, [place] Management [phone]- Mobile [phone]- Toll Free Main Office

## 2016-06-16 ENCOUNTER — Other Ambulatory Visit: Payer: Self-pay | Admitting: *Deleted

## 2016-06-16 NOTE — Patient Outreach (Signed)
[place] [facility]) Care Management  06/16/2016  Alyssa [name] 18-Sep-1935 [id number]   Placed call to patient to follow up , and reschedule missed home visit from last week.  Patient discussed she is not checking her blood sugar states she was having trouble getting the machine to work sometimes,she states she has the strips and lancets  also she discussed that she has not been taking her insulin, "not sure why I haven't been taking it.  Patient agreeable to home visit for continued Diabetes self care education management offered home visit on this week, she prefers next week due to other appointments that they have this week, patient discussed they are looking to move to [place].  Plan Will plan home visit on next week, will call patient day prior to visit as a reminder.   [name], RN, [place] Management [phone]- Mobile [phone]- Toll Free Main Office

## 2016-06-17 ENCOUNTER — Other Ambulatory Visit: Payer: Self-pay | Admitting: *Deleted

## 2016-06-20 ENCOUNTER — Ambulatory Visit: Payer: Self-pay | Admitting: Pharmacist

## 2016-06-20 ENCOUNTER — Ambulatory Visit: Payer: Self-pay | Admitting: *Deleted

## 2016-06-23 ENCOUNTER — Ambulatory Visit: Payer: Self-pay | Admitting: *Deleted

## 2016-06-24 ENCOUNTER — Other Ambulatory Visit: Payer: Self-pay | Admitting: *Deleted

## 2016-06-24 ENCOUNTER — Other Ambulatory Visit: Payer: Self-pay | Admitting: Pharmacist

## 2016-06-24 NOTE — Patient Outreach (Signed)
[place] [facility]) Care Management  06/24/2016  Alyssa [name] Oct 02, 1935 [id number]   Follow up telephone call.  Placed call to patient as a reminder of scheduled home visit on 3/14, for Diabetes education and management. Patient verbalized understanding and in agreement with visit.   Plan Will attend Scheduled home visit on next day.   [name], RN, [place] Management [phone]- Mobile [phone]- Toll Free Main Office

## 2016-06-24 NOTE — Patient Outreach (Signed)
[place] [facility]) Care Management  06/24/2016  Alyssa [name] 02-Jan-1936 [id number]  1139:  Phone call to patient, spouse answered and reported patient was still asleep.    1559:  Successful outreach to patient.  Patient reports she did not mail her portion of [employer] patient assistance application yet---reports she "put wrong paper in the envelope and needs to go get more envelopes."    Noted patient has home visit scheduled with [facility] RN tomorrow---asked patient to give form to [place] who can get it to [place].    Application for [employer] Patient Assistance is waiting on patient's portion.   Plan:  In basket message to [place] regarding patient's portion of application.  Once patient's application is complete, will mail original application to [employer] as is required by patient assistance program.   Will continue to follow-up with patient regarding patient assistance application.   Karrie Meres, PharmD, [place] [phone]

## 2016-06-25 ENCOUNTER — Other Ambulatory Visit: Payer: Self-pay | Admitting: *Deleted

## 2016-06-25 NOTE — Patient Outreach (Signed)
[place] [facility]) Care Management  [facility] Social Work  06/25/2016  Alyssa [name] 1935/10/31 [id number]  Subjective:  "sitting here with a puppy in my lap".  Objective: CSW to assist patient with commuity based resources to aide in her well-being, quality of life and overall safety/needs.    Current Medications:  Current Outpatient Prescriptions  Medication Sig Dispense Refill  . aspirin 81 MG tablet Take 81 mg by mouth daily.    [name] atorvastatin (LIPITOR) 80 MG tablet Take 1 tablet (80 mg total) by mouth daily at 6 PM. 30 tablet 11  . carvedilol (COREG) 3.125 MG tablet Take 3.125 mg by mouth 2 (two) times daily with a meal.     . clopidogrel (PLAVIX) 75 MG tablet Take 1 tablet (75 mg total) by mouth daily. 90 tablet 3  . Coenzyme Q10 (COQ10 PO) Take 1 capsule by mouth daily. Reported on 10/11/2015    . furosemide (LASIX) 20 MG tablet Take 20 mg by mouth daily.     [name] glimepiride (AMARYL) 4 MG tablet Take 4 mg by mouth daily with breakfast. Reported on 11/01/2015    . hydrOXYzine (VISTARIL) 25 MG capsule     . insulin detemir (LEVEMIR) 100 UNIT/ML injection Inject 25 Units into the skin at bedtime.    [name] levothyroxine (SYNTHROID, LEVOTHROID) 125 MCG tablet Take 125 mcg by mouth daily before breakfast.     . levothyroxine (SYNTHROID, LEVOTHROID) 125 MCG tablet Take 125 mcg by mouth daily.    [name] losartan (COZAAR) 50 MG tablet Take 1 tablet (50 mg total) by mouth daily. 90 tablet 3  . metFORMIN (GLUCOPHAGE) 500 MG tablet Take 1 tablet (500 mg total) by mouth 2 (two) times daily with a meal. (Patient not taking: Reported on 05/21/2016) 60 tablet 11  . nitroGLYCERIN (NITROSTAT) 0.4 MG SL tablet Place 0.4 mg under the tongue every 5 (five) minutes as needed for chest pain. Reported on 10/04/2015    . polyethylene glycol (MIRALAX / GLYCOLAX) packet Take 17 g by mouth daily as needed (constipation). Reported on 10/04/2015    . spironolactone (ALDACTONE) 50 MG tablet daily.    [name] ZETIA 10 MG  tablet TAKE 1 TABLET BY MOUTH EVERY DAY 30 tablet 6   No current facility-administered medications for this visit.     Functional Status:  In your present state of health, do you have any difficulty performing the following activities: 06/25/2016 04/24/2016  Hearing? N N  Vision? N N  Difficulty concentrating or making decisions? [name]  Walking or climbing stairs? Y Y  Dressing or bathing? N N  Doing errands, shopping? [name]  Preparing Food and eating ? N N  Using the Toilet? N N  In the past six months, have you accidently leaked urine? N N  Do you have problems with loss of bowel control? N N  Managing your Medications? Y Y  Managing your Finances? N N  Housekeeping or managing your Housekeeping? [name]  Some recent data might be hidden    Fall/Depression Screening:  PHQ 2/9 Scores 06/25/2016 05/08/2016 04/15/2016 10/01/2015 09/20/2015 08/07/2015 05/31/2015  PHQ - 2 Score _0 PHQ- 9 Score _1 - -  Exception Documentation - - - - (No Data) - -    Assessment: CSW spoke with patient and have secured her an outpatient appointment with Marya Amsler, LCSW, at [name] for 07/16/16. Patient agrees to plans for outpatient assessmnet/intervention with  therapist for her depression/anxiety and overall interest in "talking about my past".  Patient provided withtime/date, address,etc and has written it down.   Plan: CSW will plan f/u call 4/3 to remind her of appointment.   [facility] CM Care Plan Problem One   Flowsheet Row Most Recent Value  Care Plan Problem One  Patient with depression [Signs/symptoms of depression]  Role Documenting the Problem One  Clinical Social Worker  Care Plan for Problem One  Active  THN Long Term Goal (31-90 days)  Patient will be seen by a counselor/therapist within the next 90 days.  THN Long Term Goal Start Date  10/01/15  THN Long Term Goal Met Date  05/08/16  Interventions for Problem One Long Term Goal  CSW discusswd signs and symptoms related to  depressoin as well as her PHQ9 score. CSW to assist patient with getting mentla health outpatient links for therapy.   THN CM Short Term Goal #1 (0-30 days)  Patient will recieve information on depression and review within the next 30 days.   THN CM Short Term Goal #1 Start Date  10/01/15  [facility] CM Short Term Goal #1 Met Date  05/08/16  Interventions for Short Term Goal #1  CSW to send EMMI and arrange for outpatient appointment for mental health/depression counseling.   THN CM Short Term Goal #2 (0-30 days)  Patient will discuss anit-depresanst with PCP within the next 30 days.  THN CM Short Term Goal #2 Start Date  10/01/15  Interventions for Short Term Goal #2  CSW discussed RX for anti-depresant may be beneficial (at lower dose than prescribed).  THN CM Short Term Goal #3 (0-30 days)  Patient will reschedule [facility] appointment for treatment in the next 30 days.  THN CM Short Term Goal #3 Start Date  05/08/16  [facility] CM Short Term Goal #3 Met Date  06/25/16  Interventions for Short Tern Goal #3  CSW discussed and encouraged paitent to call and reschedule [facility] outpatient therapy .       Eduard Clos, MSW, [place] Worker  [place] [phone]

## 2016-06-25 NOTE — Patient Outreach (Signed)
[facility]) Care Management   06/25/2016  GABRYELLA MURFIN 02/16/1936 [id number]  NGOC DETJEN is an 81 y.o. female  Subjective:  Patient discussed recent problems with not sleeping well. Patient states she know she needs to take better care of herself than she is.  Patient discussing her childhood and relationship with her mother. Patient discussed she is not taking medication on a regular basis,only thing she takes on a regular basis  is her thyroid medication  Patient reports she has not been checking her blood sugar and unable to find her meter.   Objective:  BP 130/72 (BP Location: Right Arm, Patient Position: Sitting, Cuff Size: Normal)   Pulse 89   Resp 18   SpO2 97%  Review of Systems  Constitutional: Negative.   HENT: Negative.   Eyes: Negative.   Respiratory: Negative.   Cardiovascular: Negative.   Gastrointestinal: Positive for constipation.  Genitourinary: Negative.   Musculoskeletal: Negative.   Skin: Negative.   Neurological: Negative.   Endo/Heme/Allergies: Negative.   Psychiatric/Behavioral: Positive for depression. Negative for suicidal ideas.    Physical Exam  Constitutional: She is oriented to person, place, and time. She appears well-developed and well-nourished.  Cardiovascular: Normal rate, regular rhythm, normal heart sounds and intact distal pulses.   Respiratory: Effort normal and breath sounds normal.  GI: Soft.  Neurological: She is alert and oriented to person, place, and time.  Skin: Skin is warm and dry.  Psychiatric: She has a normal mood and affect. Her behavior is normal. Judgment and thought content normal.  Patient discussed being forgetful    Encounter Medications:   Outpatient Encounter Prescriptions as of 06/25/2016  Medication Sig Note  . aspirin 81 MG tablet Take 81 mg by mouth daily.   [name] atorvastatin (LIPITOR) 80 MG tablet Take 1 tablet (80 mg total) by mouth daily at 6 PM.   . carvedilol (COREG) 3.125 MG tablet  Take 3.125 mg by mouth 2 (two) times daily with a meal.  04/02/2014: .  . clopidogrel (PLAVIX) 75 MG tablet Take 1 tablet (75 mg total) by mouth daily.   . Coenzyme Q10 (COQ10 PO) Take 1 capsule by mouth daily. Reported on 10/11/2015   . furosemide (LASIX) 20 MG tablet Take 20 mg by mouth daily.  04/02/2014: .  . levothyroxine (SYNTHROID, LEVOTHROID) 125 MCG tablet Take 125 mcg by mouth daily.   [name] losartan (COZAAR) 50 MG tablet Take 1 tablet (50 mg total) by mouth daily.   . nitroGLYCERIN (NITROSTAT) 0.4 MG SL tablet Place 0.4 mg under the tongue every 5 (five) minutes as needed for chest pain. Reported on 10/04/2015 04/02/2014: .  . polyethylene glycol (MIRALAX / GLYCOLAX) packet Take 17 g by mouth daily as needed (constipation). Reported on 10/04/2015   . spironolactone (ALDACTONE) 50 MG tablet daily.   [name] ZETIA 10 MG tablet TAKE 1 TABLET BY MOUTH EVERY DAY   . glimepiride (AMARYL) 4 MG tablet Take 4 mg by mouth daily with breakfast. Reported on 11/01/2015 12/11/2015: Not taking  . hydrOXYzine (VISTARIL) 25 MG capsule    . insulin detemir (LEVEMIR) 100 UNIT/ML injection Inject 25 Units into the skin at bedtime.   [name] levothyroxine (SYNTHROID, LEVOTHROID) 125 MCG tablet Take 125 mcg by mouth daily before breakfast.    . metFORMIN (GLUCOPHAGE) 500 MG tablet Take 1 tablet (500 mg total) by mouth 2 (two) times daily with a meal. (Patient not taking: Reported on 05/21/2016)    No facility-administered encounter medications on  file as of 06/25/2016.     Functional Status:   In your present state of health, do you have any difficulty performing the following activities: 06/25/2016 04/24/2016  Hearing? N N  Vision? N N  Difficulty concentrating or making decisions? [name]  Walking or climbing stairs? Y Y  Dressing or bathing? N N  Doing errands, shopping? [name]  Preparing Food and eating ? N N  Using the Toilet? N N  In the past six months, have you accidently leaked urine? N N  Do you have problems with loss  of bowel control? N N  Managing your Medications? Y Y  Managing your Finances? N N  Housekeeping or managing your Housekeeping? [name]  Some recent data might be hidden    Fall/Depression Screening:    PHQ 2/9 Scores 06/25/2016 05/08/2016 04/15/2016 10/01/2015 09/20/2015 08/07/2015 05/31/2015  PHQ - 2 Score 6 2 2 6 5 1 1   PHQ- 9 Score 12 6 7 14 8  - -  Exception Documentation - - - - (No Data) - -    Assessment:    Diabetes - unable to find meter, not taking insulin on a regular basis recalls taking insulin about 2 weeks ago. Meter checked during visit, blood sugar 493 has eaten breakfast in the last 1 1/2 hours patient denies symptoms as hyperglycemia when reviewed. Will need MD notification of elevated blood sugar and continued education on controlling blood sugar and complications. Patient currently has levimir insulin on hand and syringes.   Medications - not consistent with taking medications  on a regular basis except thyroid medication, then waits before taking regular medication and then forgets. Needs continued education on medication adherence for current medical conditions. Medication bottles reviewed at visit.   Anxiety/Depression - agreeable to counseling  and will discuss with [facility] social worker at scheduled  telephone visit today.    Plan:  Placed call to [facility] per patient agreement regarding a free meter and 50 strips, picked meter up at pharmacy and allow patient to return demonstrate .  Placed call to [facility] in [place], able to speak with Amber that states she will forward message immediately to nurse of Cyndi Bender, PA as Charlott Holler, NP not in office till end of week, requested call back to me or patient for further directions for treatment.   Encouraged patient to continue to stay hydrated, to recheck blood sugar frequently every  2 hours until blood sugar less than 300 and to take insulin as previously prescribed and seek emergency care for new symptoms . 1600   returned call to patient reports blood sugar is down to 383 encouraged to continue to monitor blood sugar and take insulin as prescribed.   Will place follow up call to patient in the next day and scheduled follow up call in the next week . Will route this note to PCP office.    [name], RN, [place] Management [phone]- Mobile [phone]- Toll Free Main Office

## 2016-06-26 ENCOUNTER — Other Ambulatory Visit: Payer: Self-pay | Admitting: *Deleted

## 2016-06-26 NOTE — Patient Outreach (Addendum)
[place] [facility]) Care Management  06/26/2016  Alyssa [name] [birth year] [id number]   Care Coordination  Incoming call from [place] at [facility] , [place] office to follow up on call to office on yesterday regarding patient elevated blood sugar. Received voice mail message that states recommendation from Cyndi Bender, [place] for patient to continue to take her insulin as prescribed, request that this be reiterated with patient and Claiborne Billings will call patient also.     Plan Will place call to patient to remind of importance of taking medication and monitoring blood sugar.  1000 Placed call to patient no answer unable to leave a message, phone just rang.  Will follow up with patient within next week.     [name], RN, [place] Management [phone]- Mobile [phone]- Toll Free Main Office

## 2016-07-02 ENCOUNTER — Ambulatory Visit: Payer: Self-pay | Admitting: Pharmacist

## 2016-07-02 ENCOUNTER — Other Ambulatory Visit: Payer: Self-pay | Admitting: *Deleted

## 2016-07-02 NOTE — Patient Outreach (Signed)
[facility]) Care Management  07/02/2016  Alyssa [name] [birth year] [id number]   Follow up telephone call  Spoke with patient report she has been taking her insulin better,until she  ran out of syringes on yesterday. Patient discussed she was going to pharmacy today to get syringes and needs refill on some medications .   Discussed with patient possibility of her using insulin in pen form instead of vial and  syringes, patient is interested.   Patient reports she is not checking her blood sugar on a regular basis, doesn't recall checking blood sugar this week, but the last time she checked it was still 300.  Plan  Will plan call to PCP office to discuss insulin pen use to help increase patient compliance with insulin.  Will plan follow up home  visit in the next 2 weeks Encouraged patient to take insulin,monitor blood sugar and notify MD for elevated blood sugars.   [name], RN, [place] Management [phone]- Mobile [phone]- Toll Free Main Office

## 2016-07-07 DIAGNOSIS — E663 Overweight: Secondary | ICD-10-CM | POA: Diagnosis not present

## 2016-07-07 DIAGNOSIS — Z6827 Body mass index (BMI) 27.0-27.9, adult: Secondary | ICD-10-CM | POA: Diagnosis not present

## 2016-07-07 DIAGNOSIS — E1139 Type 2 diabetes mellitus with other diabetic ophthalmic complication: Secondary | ICD-10-CM | POA: Diagnosis not present

## 2016-07-07 DIAGNOSIS — R4189 Other symptoms and signs involving cognitive functions and awareness: Secondary | ICD-10-CM | POA: Diagnosis not present

## 2016-07-08 DIAGNOSIS — E1139 Type 2 diabetes mellitus with other diabetic ophthalmic complication: Secondary | ICD-10-CM | POA: Diagnosis not present

## 2016-07-08 DIAGNOSIS — R4189 Other symptoms and signs involving cognitive functions and awareness: Secondary | ICD-10-CM | POA: Diagnosis not present

## 2016-07-10 ENCOUNTER — Other Ambulatory Visit: Payer: Self-pay | Admitting: *Deleted

## 2016-07-10 NOTE — Patient Outreach (Signed)
[place] [facility]) Care Management  07/10/2016  Alyssa [name] [birth year] [id number]  Follow up telephone call to patient , no answer phone just rang unable to leave a message.  Plan Will plan return call  within the next week.    [name], RN, [place] Management [phone]- Mobile [phone]- Toll Free Main Office

## 2016-07-14 ENCOUNTER — Other Ambulatory Visit: Payer: Self-pay | Admitting: *Deleted

## 2016-07-14 NOTE — Patient Outreach (Signed)
[place] [facility]) Care Management  07/14/2016  DORIEN BESSENT 06/08/1935 [id number]   Placed call to patient to follow up, reports that she was able to get her insulin syringes, and has improved some  on taking insulin checking blood sugar, reports she blood sugar is still running in the 300's.  Discussed option of insulin pen to help with increasing compliance, patient states she would be interested if doctor agrees.  Patient agreeable to home visit on this week for continued education and support of Diabetes.    Plan Will call MD office to discuss the consideration of  Insulin pen instead of vials and syringes to help with increasing patient compliance with insulin.Spoke with [employer] that will send a message to Charlott Holler, NP. Patient with recent PCP office visit on 3/26 . Scheduled home visit for this week.   [name], RN, [place] Management [phone]- Mobile [phone]- Toll Free Main Office    [name], [place], [place] Management [phone]- Mobile [phone]- [place] Office

## 2016-07-15 ENCOUNTER — Other Ambulatory Visit: Payer: Self-pay | Admitting: Pharmacist

## 2016-07-15 ENCOUNTER — Ambulatory Visit: Payer: Self-pay | Admitting: *Deleted

## 2016-07-15 NOTE — Patient Outreach (Signed)
[place] [facility]) Care Management  07/15/2016  RASA DEGRAZIA February 20, 1936 [id number]  Successful phone outreach to patient HIPAA details verified.  Updated patient that [facility] Pharmacist placed phone call to [employer] Patient Assistance program to follow-up on her application for ezetimibe (Zetia).  [employer] reports attestation form and application were mailed back to patient 5/97/47 and application needs prescriber license number and date added.   Patient reports application was received back to her address.  She agrees to complete attestation form and give application to [place] whom has home visit scheduled with her on 07/17/16.    Plan:  Will update [facility] RN Kim regarding patient assistance application.    Will continue to follow-up patient's application for patient assistance.   Karrie Meres, PharmD, [place] [phone]

## 2016-07-16 ENCOUNTER — Ambulatory Visit (INDEPENDENT_AMBULATORY_CARE_PROVIDER_SITE_OTHER): Payer: PPO | Admitting: Licensed Clinical Social Worker

## 2016-07-16 DIAGNOSIS — F321 Major depressive disorder, single episode, moderate: Secondary | ICD-10-CM

## 2016-07-17 ENCOUNTER — Encounter: Payer: Self-pay | Admitting: *Deleted

## 2016-07-17 ENCOUNTER — Other Ambulatory Visit: Payer: Self-pay | Admitting: *Deleted

## 2016-07-17 NOTE — Patient Outreach (Signed)
[name]) Care Management   07/17/2016  SUTTON PLAKE 1935/11/25 [id number]  Alyssa [name] is an 81 y.o. female  Subjective:  Patient discussed her visit to mental health appointment on yesterday and reports therapist wants her to focus on doing things that she enjoys doing. She discussed things that she likes to do and things that she used to do but not able to do now.   Patient discussed she has checked her blood sugar a few times in the past 2 weeks, and has taken insulin a few times and the last time she took dose it was 30 units.   Patient reports she did not take insulin on last night just slips her mind at times.  Objective:  BP 120/80 (BP Location: Right Arm, Patient Position: Sitting, Cuff Size: Normal)   Pulse 78   Resp 18   SpO2 97%  Review of Systems  Constitutional: Negative.   HENT: Negative.   Eyes: Negative.   Respiratory: Negative.   Cardiovascular: Negative.   Gastrointestinal: Negative.   Genitourinary: Negative.   Musculoskeletal: Negative.   Skin: Negative.   Neurological: Negative.   Endo/Heme/Allergies: Negative.   Psychiatric/Behavioral:       Forgetful at times     Physical Exam  Constitutional: She is oriented to person, place, and time. She appears well-developed and well-nourished.  Cardiovascular: Normal rate, normal heart sounds and intact distal pulses.   Respiratory: Effort normal and breath sounds normal.  GI: Soft.  Neurological: She is alert and oriented to person, place, and time.  Skin: Skin is warm and dry.  Psychiatric: She has a normal mood and affect. Her behavior is normal. Judgment and thought content normal.    Encounter Medications:   Outpatient Encounter Prescriptions as of 07/17/2016  Medication Sig Note  . aspirin 81 MG tablet Take 81 mg by mouth daily.   [name] atorvastatin (LIPITOR) 80 MG tablet Take 1 tablet (80 mg total) by mouth daily at 6 PM.   . carvedilol (COREG) 3.125 MG tablet Take 3.125 mg by mouth  2 (two) times daily with a meal.  04/02/2014: .  . clopidogrel (PLAVIX) 75 MG tablet Take 1 tablet (75 mg total) by mouth daily.   . Coenzyme Q10 (COQ10 PO) Take 1 capsule by mouth daily. Reported on 10/11/2015   . furosemide (LASIX) 20 MG tablet Take 20 mg by mouth daily.  04/02/2014: .  . glimepiride (AMARYL) 4 MG tablet Take 4 mg by mouth daily with breakfast. Reported on 11/01/2015 12/11/2015: Not taking  . hydrOXYzine (VISTARIL) 25 MG capsule    . insulin detemir (LEVEMIR) 100 UNIT/ML injection Inject 25 Units into the skin at bedtime.   [name] levothyroxine (SYNTHROID, LEVOTHROID) 125 MCG tablet Take 125 mcg by mouth daily before breakfast.    . levothyroxine (SYNTHROID, LEVOTHROID) 125 MCG tablet Take 125 mcg by mouth daily.   [name] losartan (COZAAR) 50 MG tablet Take 1 tablet (50 mg total) by mouth daily.   . metFORMIN (GLUCOPHAGE) 500 MG tablet Take 1 tablet (500 mg total) by mouth 2 (two) times daily with a meal. (Patient not taking: Reported on 05/21/2016)   . nitroGLYCERIN (NITROSTAT) 0.4 MG SL tablet Place 0.4 mg under the tongue every 5 (five) minutes as needed for chest pain. Reported on 10/04/2015 04/02/2014: .  . polyethylene glycol (MIRALAX / GLYCOLAX) packet Take 17 g by mouth daily as needed (constipation). Reported on 10/04/2015   . spironolactone (ALDACTONE) 50 MG tablet daily.   [name]  ZETIA 10 MG tablet TAKE 1 TABLET BY MOUTH EVERY DAY    No facility-administered encounter medications on file as of 07/17/2016.     Functional Status:   In your present state of health, do you have any difficulty performing the following activities: 06/25/2016 04/24/2016  Hearing? N N  Vision? N N  Difficulty concentrating or making decisions? [name]  Walking or climbing stairs? Y Y  Dressing or bathing? N N  Doing errands, shopping? [name]  Preparing Food and eating ? N N  Using the Toilet? N N  In the past six months, have you accidently leaked urine? N N  Do you have problems with loss of bowel control? N N   Managing your Medications? Y Y  Managing your Finances? N N  Housekeeping or managing your Housekeeping? [name]  Some recent data might be hidden    Fall/Depression Screening:    PHQ 2/9 Scores 06/25/2016 05/08/2016 04/15/2016 10/01/2015 09/20/2015 08/07/2015 05/31/2015  PHQ - 2 Score _0 PHQ- 9 Score _1 - -  Exception Documentation - - - - (No Data) - -    Assessment:     Diabetes-  Meter reviewed 30 day average 373, only 10 blood sugar checks, patient not consistently checking blood sugar or taking insulin on a daily basis. Recent recheck of Alc 13.3. Today's blood sugar 444 at visit today, patient has eaten breakfast in the last 2 hours. Rechecked blood sugar after 30 minutes blood sugar down to 345. Patient has not  had a eye exam in the last year agrees to make appointment . Patient agreeable to using insulin pen, has all supplies..  Anxiety/Depression - recent visit at behavioral heath with therapist , has follow up visit in June, encouraged to follow through on interventions discussed at visits.     Plan:  Placed call to [facility] regarding elevated blood sugar at home visit. Able to speak with [name], Triage nurse, discussed patient elevated blood sugar and concerns related patient non adherence with taking insulin and checking blood sugars,  Advised by office  for patient to begin taking Levimir insulin in am daily instead of at bedtime. Instructed patient to take insulin daily in the am only  instead of at bedtime, patient able to restate when to take insulin. Teach patient how to use Levimir insulin pen,using insulin trainer pen, patient able to demonstrate with teach back. Patient verbalized she would be able to give insulin by pen.  Patient will check blood sugar daily, keep a record and take insulin as directed. Plan follow up call in the next 5 days .  Will send PCP this visit note as quarterly update.   THN CM Care Plan Problem One     Most  Recent Value  Care Plan Problem One  Knowledge deficit related Diabetes self care management as evidenced by elevated blood sugar readings   Role Documenting the Problem One  Care Management [place] for Problem One  Active  THN Long Term Goal (31-90 days)  Patient will report increase knowledge of Diabetes self care management in the next 90 days [goal restated on 06/02/16]  [facility] Long Term Goal Start Date  06/02/16 [name] date restart as of 12/12 ]  Interventions for Problem One Long Term Goal  Encouraged importance of taking medications, monitoring blood sugars, disucssed recent A1c reading of 13.3 and how elevated blood sugar effects body systems  THN CM Short Term Goal #1 (0-30 days)  Patient will be able to report checking blood sugar at least 3 days a week  and recording in the next 30 days  THN CM Short Term Goal #1 Start Date  06/25/16 [goal date restarted on 06/02/16]  Interventions for Short Term Goal #1  Discussed keeping a record of blood sugar , reviewed blood sugar log sheet and how document today's reading   THN CM Short Term Goal #2 (0-30 days)  Patient will begin to demonstrate steps to control blood sugar in the next 30 days   THN CM Short Term Goal #2 Start Date  06/25/16  Interventions for Short Term Goal #2  Reviewed EMMI on steps to controlling blood sugar, [facility] handout on knowing blood sugar range .   THN CM Short Term Goal #3 (0-30 days)  Patient will report getting syringes and strips refill in the next 14 days   THN CM Short Term Goal #3 Start Date  07/02/16  [facility] CM Short Term Goal #3 Met Date  07/14/16  THN CM Short Term Goal #4 (0-30 days)  Patient will report using levemir insulin pin in the next 14 days   THN CM Short Term Goal #4 Start Date  07/17/16 [goal date restarted ]  Interventions for Short Term Goal #4  Educated on using levemir insulin pen, Demonstrated using pen on trainer insulin pen, allowed patient return demonstate, , and able to teach  back. watched EMMI education video on using insulin pen , instructed on keeping unopened insulin pen in refrigerator and keeping pen she uses daily on her table.        [name], RN, [place] Management [phone]- Mobile [phone]- Toll Free Main Office

## 2016-07-23 ENCOUNTER — Other Ambulatory Visit: Payer: Self-pay | Admitting: *Deleted

## 2016-07-23 DIAGNOSIS — Z6827 Body mass index (BMI) 27.0-27.9, adult: Secondary | ICD-10-CM | POA: Diagnosis not present

## 2016-07-23 DIAGNOSIS — J3089 Other allergic rhinitis: Secondary | ICD-10-CM | POA: Diagnosis not present

## 2016-07-23 NOTE — Patient Outreach (Signed)
[name]) Care Management  07/23/2016  Alyssa [name] January 01, 1936 [id number]  Follow up telephone call   Placed call to patient to follow up on insulin pen use, patient discussed she was having difficulty using the pen, she thought she had it during last home visit but was not able to figure it out on her own. Patient reports when she did take  her insulin she has been using the vial and syringe. Patient discussed taking her insulin at night is going to work better for her, she states she is willing to give using the insulin pen a try after further instructions.  Reinforced with patient importance of checking her blood sugar and taking her insulin, encouraged to continue to take her insulin using vial and syringe until our next visit. Patient states she did not take her insulin last night, and states the las time she checked her blood sugar it was 300's.  Patient discussed her biggest concern today is she thinks she as a cold , she coughed all night and she has call PCP office for a visit and has an appointment this afternoon.   Plan Patient to  attend PCP visit on today Patient will begin to monitor and record blood sugars and take insulin at bedtime as ordered by using vial and syringe until further instruction on insulin pen.  Plan follow up home visit in the next 2 weeks.     [name], RN, [place] Management [phone]- Mobile [phone]- Toll Free Main Office

## 2016-08-04 ENCOUNTER — Other Ambulatory Visit: Payer: Self-pay | Admitting: Pharmacist

## 2016-08-04 NOTE — Patient Outreach (Signed)
[place] [facility]) Care Management  08/04/2016  SHANTOYA GEURTS [birth year] [id number]  Call placed to [employer] Patient Assistance program to follow-up patient's application status for ezetimibe (Zetia) patient assistance.  Representative reported patient's application was approved on 07/30/16 and prescription has not been processed yet by their pharmacy.   Successful phone outreach to patient to update patient of her approval from [employer] Patient Assistance.    Advised patient per [employer] Patient Assistance, prescription will be shipped to her home address once processed.    Patient denied questions.   Plan:  Will continue to follow-up with patient to ensure she receives ezetimibe from [employer] Patient Assistance.    Karrie Meres, PharmD, [place] [phone]

## 2016-08-05 ENCOUNTER — Ambulatory Visit: Payer: Self-pay | Admitting: *Deleted

## 2016-08-05 ENCOUNTER — Other Ambulatory Visit: Payer: Self-pay | Admitting: *Deleted

## 2016-08-05 ENCOUNTER — Encounter: Payer: Self-pay | Admitting: *Deleted

## 2016-08-05 NOTE — Patient Outreach (Signed)
[facility]) Care Management   08/05/2016  BRETTA FEES 08-27-1935 [id number]  Alyssa [name] is an 81 y.o. female  Subjective:  Patient discussed her recent visit to PCP office due to allergy problems and new medication prescribed.  Patient discussed she has been taking her insulin on this week, at night time but using her vial of insulin because she is not sure about use of  the pen," I thought I understood it, but need a little more help"  Objective:  BP 138/78 (BP Location: Right Arm, Patient Position: Sitting, Cuff Size: Normal)   Pulse 84   Resp 18   SpO2 97%   Review of Systems  Constitutional: Negative.   HENT: Negative.   Eyes: Negative.   Respiratory: Negative.   Cardiovascular: Negative.   Gastrointestinal: Negative.   Genitourinary: Negative.   Musculoskeletal: Negative.   Skin: Negative.   Neurological: Negative.   Endo/Heme/Allergies: Negative.   Psychiatric/Behavioral: Positive for depression.    Physical Exam  Constitutional: She is oriented to person, place, and time. She appears well-developed and well-nourished.  Cardiovascular: Normal rate and normal heart sounds.   Respiratory: Effort normal.  GI: Soft.  Neurological: She is alert and oriented to person, place, and time.  Skin: Skin is warm and dry.    Encounter Medications:   Outpatient Encounter Prescriptions as of 08/05/2016  Medication Sig Note  . aspirin 81 MG tablet Take 81 mg by mouth daily.   [name] atorvastatin (LIPITOR) 80 MG tablet Take 1 tablet (80 mg total) by mouth daily at 6 PM.   . carvedilol (COREG) 3.125 MG tablet Take 3.125 mg by mouth 2 (two) times daily with a meal.  04/02/2014: .  . clopidogrel (PLAVIX) 75 MG tablet Take 1 tablet (75 mg total) by mouth daily.   . Coenzyme Q10 (COQ10 PO) Take 1 capsule by mouth daily. Reported on 10/11/2015   . furosemide (LASIX) 20 MG tablet Take 20 mg by mouth daily.  04/02/2014: .  . glimepiride (AMARYL) 4 MG tablet Take 4 mg by  mouth daily with breakfast. Reported on 11/01/2015 12/11/2015: Not taking  . hydrOXYzine (VISTARIL) 25 MG capsule    . insulin detemir (LEVEMIR) 100 UNIT/ML injection Inject 30 Units into the skin at bedtime.    [name] levothyroxine (SYNTHROID, LEVOTHROID) 125 MCG tablet Take 125 mcg by mouth daily before breakfast.    . levothyroxine (SYNTHROID, LEVOTHROID) 125 MCG tablet Take 125 mcg by mouth daily.   [name] losartan (COZAAR) 50 MG tablet Take 1 tablet (50 mg total) by mouth daily. 07/17/2016: Currently out, called pharmacy for refill, pharmacy to call MD office  for renewal.  . metFORMIN (GLUCOPHAGE) 500 MG tablet Take 1 tablet (500 mg total) by mouth 2 (two) times daily with a meal. (Patient not taking: Reported on 05/21/2016)   . nitroGLYCERIN (NITROSTAT) 0.4 MG SL tablet Place 0.4 mg under the tongue every 5 (five) minutes as needed for chest pain. Reported on 10/04/2015 04/02/2014: .  . polyethylene glycol (MIRALAX / GLYCOLAX) packet Take 17 g by mouth daily as needed (constipation). Reported on 10/04/2015   . spironolactone (ALDACTONE) 50 MG tablet daily. 1/2 tablet daily   . ZETIA 10 MG tablet TAKE 1 TABLET BY MOUTH EVERY DAY    No facility-administered encounter medications on file as of 08/05/2016.     Functional Status:   In your present state of health, do you have any difficulty performing the following activities: 06/25/2016 04/24/2016  Hearing? N N  Vision? N N  Difficulty concentrating or making decisions? [name]  Walking or climbing stairs? Y Y  Dressing or bathing? N N  Doing errands, shopping? [name]  Preparing Food and eating ? N N  Using the Toilet? N N  In the past six months, have you accidently leaked urine? N N  Do you have problems with loss of bowel control? N N  Managing your Medications? Y Y  Managing your Finances? N N  Housekeeping or managing your Housekeeping? [name]  Some recent data might be hidden    Fall/Depression Screening:    PHQ 2/9 Scores 06/25/2016 05/08/2016 04/15/2016  10/01/2015 09/20/2015 08/07/2015 05/31/2015  PHQ - 2 Score 6 2 2 6 5 1 1   PHQ- 9 Score 12 6 7 14 8  - -  Exception Documentation - - - - (No Data) - -    Assessment:    Diabetes  - improvement in taking insulin daily per report  has checked blood for last 4 days  averages 369 for the last week. Need continued education on insulin pen     Plan:  Plan follow up call in the next 2 weeks  Will plan home visit in the next month for continued education of Diabetes.  Provide re education on insulin pen use, and allow patient to demonstrate.   [name], RN, [place] Management [phone]- Mobile [phone]- Toll Free Main Office

## 2016-08-06 ENCOUNTER — Other Ambulatory Visit: Payer: Self-pay | Admitting: *Deleted

## 2016-08-06 NOTE — Patient Outreach (Signed)
[facility] [facility]) Care Management  [facility] Social Work  08/06/2016  Alyssa [name] 03-16-1936 [id number]  Subjective:  "My allergies are bad"  Objective:  CSW to assist patient with commuity based resources to aide in her well-being, quality of life and overall safety/needs.    Encounter Medications:  Outpatient Encounter Prescriptions as of 08/06/2016  Medication Sig Note  . aspirin 81 MG tablet Take 81 mg by mouth daily.   [name] atorvastatin (LIPITOR) 80 MG tablet Take 1 tablet (80 mg total) by mouth daily at 6 PM.   . carvedilol (COREG) 3.125 MG tablet Take 3.125 mg by mouth 2 (two) times daily with a meal.  04/02/2014: .  [name] Cholecalciferol ([employer] D3) 2000 units CAPS Take 1 capsule by mouth daily.   . clopidogrel (PLAVIX) 75 MG tablet Take 1 tablet (75 mg total) by mouth daily.   . Coenzyme Q10 (COQ10 PO) Take 1 capsule by mouth daily. Reported on 10/11/2015   . fluticasone (FLONASE) 50 MCG/ACT nasal spray Place 2 sprays into both nostrils daily.   . furosemide (LASIX) 20 MG tablet Take 20 mg by mouth daily.  04/02/2014: .  . glimepiride (AMARYL) 4 MG tablet Take 4 mg by mouth daily with breakfast. Reported on 11/01/2015 12/11/2015: Not taking  . hydrOXYzine (VISTARIL) 25 MG capsule    . insulin detemir (LEVEMIR) 100 UNIT/ML injection Inject 30 Units into the skin at bedtime.    [name] levothyroxine (SYNTHROID, LEVOTHROID) 125 MCG tablet Take 125 mcg by mouth daily before breakfast.    . levothyroxine (SYNTHROID, LEVOTHROID) 125 MCG tablet Take 125 mcg by mouth daily.   [name] losartan (COZAAR) 50 MG tablet Take 1 tablet (50 mg total) by mouth daily. 07/17/2016: Currently out, called pharmacy for refill, pharmacy to call MD office  for renewal.  . metFORMIN (GLUCOPHAGE) 500 MG tablet Take 1 tablet (500 mg total) by mouth 2 (two) times daily with a meal. (Patient not taking: Reported on 05/21/2016)   . nitroGLYCERIN (NITROSTAT) 0.4 MG SL tablet Place 0.4 mg under the tongue every 5 (five) minutes  as needed for chest pain. Reported on 10/04/2015 04/02/2014: .  . polyethylene glycol (MIRALAX / GLYCOLAX) packet Take 17 g by mouth daily as needed (constipation). Reported on 10/04/2015   . ranitidine (ZANTAC) 150 MG tablet Take 150 mg by mouth 2 (two) times daily.   [name] spironolactone (ALDACTONE) 50 MG tablet daily. 1/2 tablet daily   . vitamin B-12 (CYANOCOBALAMIN) 1000 MCG tablet Take 1,000 mcg by mouth daily.   [name] ZETIA 10 MG tablet TAKE 1 TABLET BY MOUTH EVERY DAY 08/05/2016: Awaiting refill to be delivered    No facility-administered encounter medications on file as of 08/06/2016.     Functional Status:  In your present state of health, do you have any difficulty performing the following activities: 06/25/2016 04/24/2016  Hearing? N N  Vision? N N  Difficulty concentrating or making decisions? [name]  Walking or climbing stairs? Y Y  Dressing or bathing? N N  Doing errands, shopping? [name]  Preparing Food and eating ? N N  Using the Toilet? N N  In the past six months, have you accidently leaked urine? N N  Do you have problems with loss of bowel control? N N  Managing your Medications? Y Y  Managing your Finances? N N  Housekeeping or managing your Housekeeping? [name]  Some recent data might be hidden    Fall/Depression Screening:  PHQ 2/9 Scores 06/25/2016 05/08/2016  04/15/2016 10/01/2015 09/20/2015 08/07/2015 05/31/2015  PHQ - 2 Score 6 2 2 6 5 1 1   PHQ- 9 Score 12 6 7 14 8  - -  Exception Documentation - - - - (No Data) - -    Assessment: CSW spoke with patient by phone today. She reports having allergy problems but not other issues or concerns. She plans to see her mental health therapist next week and is pleased with the last visit and her overall feelings. "I want to exercise more get out in the garden". Patient shared with CSW that [name] came to visit and set up "insulin pen" for patient to use.  CSW praised patient and encouraged patient to exercise and garden.  Plan:  CSW will  plan a f/u call to patient in the next 2 weeks for updates on her progress and therapy and possible case closure.    Eduard Clos, MSW, [place] Worker  [place] [phone]

## 2016-08-11 ENCOUNTER — Ambulatory Visit (INDEPENDENT_AMBULATORY_CARE_PROVIDER_SITE_OTHER): Payer: PPO | Admitting: Licensed Clinical Social Worker

## 2016-08-11 DIAGNOSIS — F321 Major depressive disorder, single episode, moderate: Secondary | ICD-10-CM | POA: Diagnosis not present

## 2016-08-14 ENCOUNTER — Other Ambulatory Visit: Payer: Self-pay | Admitting: Pharmacist

## 2016-08-14 NOTE — Patient Outreach (Signed)
[facility]) Care Management  08/14/2016  Alyssa [name] [birth year] [id number]  Follow-up call to patient to determine if she received ezetimibe (Zetia) from [employer] patient assistance.  Patient reports not yet.   Phone call to [employer] patient assistance program regarding shipment status.  Representative reports shipment was sent to [place] on 08/12/16 and may take 7-10 business days to reach patient's home address.    Placed call back to patient to update her on the above shipping information.   Plan:  Will follow-up with patient next week to see if she received her patient assistance shipment.   Karrie Meres, PharmD, [place] [phone]

## 2016-08-19 ENCOUNTER — Other Ambulatory Visit: Payer: Self-pay | Admitting: *Deleted

## 2016-08-19 NOTE — Patient Outreach (Signed)
[facility]) Care Management  08/19/2016  Alyssa [name] Sep 06, 1935 [id number]  Telephone follow up   Unsuccessful telephone call to patient, no answer unable to leave a message, phone just rang.   Plan Will plan follow up call in the next 3 days    [name], RN, [place] Management [phone]- Mobile [phone]- [place]

## 2016-08-19 NOTE — Patient Outreach (Signed)
[facility]) Care Management  [facility] Social Work  08/19/2016  Alyssa [name] December 23, 1935 [id number]  Subjective:   "feeling much better"  Objective: CSW to assist patient with commuity based resources to aide in her well-being, quality of life and overall safety/needs.    Current Medications:  Current Outpatient Prescriptions  Medication Sig Dispense Refill  . aspirin 81 MG tablet Take 81 mg by mouth daily.    [name] atorvastatin (LIPITOR) 80 MG tablet Take 1 tablet (80 mg total) by mouth daily at 6 PM. 30 tablet 11  . carvedilol (COREG) 3.125 MG tablet Take 3.125 mg by mouth 2 (two) times daily with a meal.     . Cholecalciferol (CVS D3) 2000 units CAPS Take 1 capsule by mouth daily.    . clopidogrel (PLAVIX) 75 MG tablet Take 1 tablet (75 mg total) by mouth daily. 90 tablet 3  . Coenzyme Q10 (COQ10 PO) Take 1 capsule by mouth daily. Reported on 10/11/2015    . fluticasone (FLONASE) 50 MCG/ACT nasal spray Place 2 sprays into both nostrils daily.    . furosemide (LASIX) 20 MG tablet Take 20 mg by mouth daily.     [name] glimepiride (AMARYL) 4 MG tablet Take 4 mg by mouth daily with breakfast. Reported on 11/01/2015    . hydrOXYzine (VISTARIL) 25 MG capsule     . insulin detemir (LEVEMIR) 100 UNIT/ML injection Inject 30 Units into the skin at bedtime.     [name] levothyroxine (SYNTHROID, LEVOTHROID) 125 MCG tablet Take 125 mcg by mouth daily before breakfast.     . levothyroxine (SYNTHROID, LEVOTHROID) 125 MCG tablet Take 125 mcg by mouth daily.    [name] losartan (COZAAR) 50 MG tablet Take 1 tablet (50 mg total) by mouth daily. 90 tablet 3  . metFORMIN (GLUCOPHAGE) 500 MG tablet Take 1 tablet (500 mg total) by mouth 2 (two) times daily with a meal. (Patient not taking: Reported on 05/21/2016) 60 tablet 11  . nitroGLYCERIN (NITROSTAT) 0.4 MG SL tablet Place 0.4 mg under the tongue every 5 (five) minutes as needed for chest pain. Reported on 10/04/2015    . polyethylene glycol (MIRALAX / GLYCOLAX)  packet Take 17 g by mouth daily as needed (constipation). Reported on 10/04/2015    . ranitidine (ZANTAC) 150 MG tablet Take 150 mg by mouth 2 (two) times daily.    [name] spironolactone (ALDACTONE) 50 MG tablet daily. 1/2 tablet daily    . vitamin B-12 (CYANOCOBALAMIN) 1000 MCG tablet Take 1,000 mcg by mouth daily.    [name] ZETIA 10 MG tablet TAKE 1 TABLET BY MOUTH EVERY DAY 30 tablet 6   No current facility-administered medications for this visit.     Functional Status:  In your present state of health, do you have any difficulty performing the following activities: 06/25/2016 04/24/2016  Hearing? N N  Vision? N N  Difficulty concentrating or making decisions? [name]  Walking or climbing stairs? Y Y  Dressing or bathing? N N  Doing errands, shopping? [name]  Preparing Food and eating ? N N  Using the Toilet? N N  In the past six months, have you accidently leaked urine? N N  Do you have problems with loss of bowel control? N N  Managing your Medications? Y Y  Managing your Finances? N N  Housekeeping or managing your Housekeeping? Y Y  Some recent data might be hidden    Fall/Depression Screening:  Fall Risk  06/25/2016 05/08/2016 03/25/2016  Falls in  the past year? Yes Yes Yes  Number falls in past yr: 1 1 1   Injury with Fall? No No No  Risk for fall due to : History of fall(s) Impaired balance/gait History of fall(s)  Risk for fall due to (comments): - - -  Follow up Falls prevention discussed Falls evaluation completed;Falls prevention discussed;Education provided Falls prevention discussed   PHQ 2/9 Scores 08/19/2016 06/25/2016 05/08/2016 04/15/2016 10/01/2015 09/20/2015 08/07/2015  PHQ - 2 Score 2 6 2 2 6 5 1   PHQ- 9 Score 5 12 6 7 14 8  -  Exception Documentation - - - - - (No Data) -    Assessment:  CSW spoke with patient who is feeling better; medically her cramps and allergies are better. As well, she admits to "realizing I have been angry at myself". CSW listened and validated her thoughts,  feelings and overall depression. She shared some childhood memories and reports she has been going to the therapist and sees improvement.    Plan:  CSW discussed goals met and plans to close CSW referral as she has been linked with community resources, outpatient mental health  therapy and denies any further needs from CSW.  CSW will advise PCP and [facility] team of plans for CSW referral closure.     THN CM Care Plan Problem One     Most Recent Value  Care Plan Problem One  Patient with depression [Signs/symptoms of depression]  Role Documenting the Problem One  Clinical Social Worker  Care Plan for Problem One  Active  THN Long Term Goal (31-90 days)  Patient will be seen by a counselor/therapist within the next 90 days.  THN Long Term Goal Start Date  10/01/15  THN Long Term Goal Met Date  05/08/16  Interventions for Problem One Long Term Goal  CSW discusswd signs and symptoms related to depressoin as well as her PHQ9 score. CSW to assist patient with getting mentla health outpatient links for therapy.   THN CM Short Term Goal #1 (0-30 days)  Patient will recieve information on depression and review within the next 30 days.   THN CM Short Term Goal #1 Start Date  10/01/15  [facility] CM Short Term Goal #1 Met Date  05/08/16  Interventions for Short Term Goal #1  CSW to send EMMI and arrange for outpatient appointment for mental health/depression counseling.   THN CM Short Term Goal #2 (0-30 days)  Patient will discuss anit-depresanst with PCP within the next 30 days.  THN CM Short Term Goal #2 Start Date  10/01/15  [facility] CM Short Term Goal #2 Met Date  08/19/16  Interventions for Short Term Goal #2  CSW discussed RX for anti-depresant may be beneficial (at lower dose than prescribed).  THN CM Short Term Goal #3 (0-30 days)  Patient will reschedule [facility] appointment for treatment in the next 30 days.  THN CM Short Term Goal #3 Start Date  05/08/16  [facility] CM Short Term Goal #3 Met Date  06/25/16   Interventions for Short Tern Goal #3  CSW discussed and encouraged paitent to call and reschedule [facility] outpatient therapy .        Eduard Clos, MSW, [place] Worker  [place] [phone]

## 2016-08-20 ENCOUNTER — Other Ambulatory Visit: Payer: Self-pay | Admitting: Pharmacist

## 2016-08-20 NOTE — Patient Outreach (Signed)
[facility]) Care Management  08/20/2016  Alyssa [name] 02-14-1936 [id number]  Successful phone outreach to patient.  Patient reports she received ezetimibe (Zetia) from [employer] Patient Assistance program yesterday.  Patient reports she has the phone number and prescription number to order her refill.  Patient is aware [employer] Patient Assistance program may change eligibility at any time.     At this time, patient denies other pharmacy related issues.   Plan:  Close pharmacy case.  Patient has [facility] Pharmacist phone number if new issues arise.   Will update [facility] RN Community, [place], of pharmacy case closure.    Will update Dr Radford Pax that patient was approved and received ezetimibe from patient assistance program.    Karrie Meres, PharmD, [place] [phone]

## 2016-08-21 ENCOUNTER — Other Ambulatory Visit: Payer: Self-pay | Admitting: *Deleted

## 2016-08-21 NOTE — Patient Outreach (Signed)
[facility]) Care Management  08/21/2016  Alyssa [name] Feb 20, 1936 [id number]   Telephone follow up   Spoke with patient reports she is doing fairly well, just tired today, due to getting to bed later on last night.  Patient discussed she has been using insulin pen, and taking insulin on "most days". Patient recall last blood sugar she checked was in the 300 range, admits to not checking blood sugar in a week.  Patient denies having any symptoms of low blood sugar.  Patient will benefit from continued education and support on diabetes self care management . Patient agreeable to home visit.  Patient discussed she had made an appointment for eye exam, and plans to make appointment for dental exam.    Plan Will plan home visit in the next 2 weeks for continued education and support of diabetes.    [name], RN, [place] Management [phone]- Mobile [phone]- Toll Free Main Office

## 2016-08-22 ENCOUNTER — Ambulatory Visit: Payer: Self-pay | Admitting: Pharmacist

## 2016-09-02 ENCOUNTER — Encounter: Payer: Self-pay | Admitting: *Deleted

## 2016-09-02 ENCOUNTER — Other Ambulatory Visit: Payer: Self-pay | Admitting: *Deleted

## 2016-09-02 NOTE — Patient Outreach (Addendum)
[name]) Care Management   09/02/2016  [name] 07/29/1935 [id number]  Alyssa [name] is an 81 y.o. female  Subjective:  Patient discussed feeling pretty good on today . Patient discussed she has made appointment with a Dentist for exam and also with eye doctor for exam.   Patient reports she is now using insulin 40 units , reports she prefers to use insulin with  syringes she knows how to use it and it less frustrating for her.  Patient discussed she is taking her medications on most days, and reports improvement on taking insulin at bedtime.   Patient complaint of tender redden area above left great toes, she discussed she has been to a podiatrist in the past and nail was suppose to be permanently removed but it grew back.    Objective:  BP 128/60 (BP Location: Left Arm, Patient Position: Sitting, Cuff Size: Normal)   Pulse 75   Resp 18   SpO2 97%  Review of Systems  Constitutional: Negative.   HENT: Negative.   Eyes: Negative.   Respiratory: Negative.   Cardiovascular: Negative.   Gastrointestinal: Negative.   Genitourinary: Negative.   Musculoskeletal: Negative.   Skin: Negative.   Neurological: Negative.   Endo/Heme/Allergies: Negative.   Psychiatric/Behavioral: Positive for memory loss.       Complains of being forgetful     Physical Exam  Constitutional: She is oriented to person, place, and time. She appears well-developed and well-nourished.  Cardiovascular: Normal rate, normal heart sounds and intact distal pulses.   Respiratory: Effort normal and breath sounds normal.  GI: Soft. Bowel sounds are normal.  Neurological: She is alert and oriented to person, place, and time.  Skin: Skin is warm, dry and intact.     Psychiatric: She has a normal mood and affect. Her behavior is normal. Judgment and thought content normal.    Encounter Medications:   Outpatient Encounter Prescriptions as of 09/02/2016  Medication Sig Note  . aspirin 81  MG tablet Take 81 mg by mouth daily.   [name] atorvastatin (LIPITOR) 80 MG tablet Take 1 tablet (80 mg total) by mouth daily at 6 PM.   . carvedilol (COREG) 3.125 MG tablet Take 3.125 mg by mouth 2 (two) times daily with a meal.  04/02/2014: .  [name] Cholecalciferol ([employer] D3) 2000 units CAPS Take 1 capsule by mouth daily.   . clopidogrel (PLAVIX) 75 MG tablet Take 1 tablet (75 mg total) by mouth daily.   . Coenzyme Q10 (COQ10 PO) Take 1 capsule by mouth daily. Reported on 10/11/2015   . fluticasone (FLONASE) 50 MCG/ACT nasal spray Place 2 sprays into both nostrils daily.   . furosemide (LASIX) 20 MG tablet Take 20 mg by mouth daily.  04/02/2014: .  . glimepiride (AMARYL) 4 MG tablet Take 4 mg by mouth daily with breakfast. Reported on 11/01/2015 12/11/2015: Not taking  . hydrOXYzine (VISTARIL) 25 MG capsule    . insulin detemir (LEVEMIR) 100 UNIT/ML injection Inject 30 Units into the skin at bedtime.    [name] levothyroxine (SYNTHROID, LEVOTHROID) 125 MCG tablet Take 125 mcg by mouth daily before breakfast.    . levothyroxine (SYNTHROID, LEVOTHROID) 125 MCG tablet Take 125 mcg by mouth daily.   [name] losartan (COZAAR) 50 MG tablet Take 1 tablet (50 mg total) by mouth daily. 07/17/2016: Currently out, called pharmacy for refill, pharmacy to call MD office  for renewal.  . metFORMIN (GLUCOPHAGE) 500 MG tablet Take 1 tablet (500 mg total)  by mouth 2 (two) times daily with a meal. (Patient not taking: Reported on 05/21/2016)   . nitroGLYCERIN (NITROSTAT) 0.4 MG SL tablet Place 0.4 mg under the tongue every 5 (five) minutes as needed for chest pain. Reported on 10/04/2015 04/02/2014: .  . polyethylene glycol (MIRALAX / GLYCOLAX) packet Take 17 g by mouth daily as needed (constipation). Reported on 10/04/2015   . ranitidine (ZANTAC) 150 MG tablet Take 150 mg by mouth 2 (two) times daily.   [name] spironolactone (ALDACTONE) 50 MG tablet daily. 1/2 tablet daily   . vitamin B-12 (CYANOCOBALAMIN) 1000 MCG tablet Take 1,000 mcg by mouth  daily.   [name] ZETIA 10 MG tablet TAKE 1 TABLET BY MOUTH EVERY DAY 08/05/2016: Awaiting refill to be delivered    No facility-administered encounter medications on file as of 09/02/2016.     Functional Status:   In your present state of health, do you have any difficulty performing the following activities: 06/25/2016 04/24/2016  Hearing? N N  Vision? N N  Difficulty concentrating or making decisions? [name]  Walking or climbing stairs? Y Y  Dressing or bathing? N N  Doing errands, shopping? [name]  Preparing Food and eating ? N N  Using the Toilet? N N  In the past six months, have you accidently leaked urine? N N  Do you have problems with loss of bowel control? N N  Managing your Medications? Y Y  Managing your Finances? N N  Housekeeping or managing your Housekeeping? Y Y  Some recent data might be hidden    Fall/Depression Screening:    Fall Risk  06/25/2016 05/08/2016 03/25/2016  Falls in the past year? Yes Yes Yes  Number falls in past yr: 1 1 1   Injury with Fall? No No No  Risk for fall due to : History of fall(s) Impaired balance/gait History of fall(s)  Risk for fall due to (comments): - - -  Follow up Falls prevention discussed Falls evaluation completed;Falls prevention discussed;Education provided Falls prevention discussed   PHQ 2/9 Scores 08/19/2016 06/25/2016 05/08/2016 04/15/2016 10/01/2015 09/20/2015 08/07/2015  PHQ - 2 Score 2 6 2 2 6 5 1   PHQ- 9 Score 5 12 6 7 14 8  -  Exception Documentation - - - - - (No Data) -    Assessment:    Diabetes  Has scheduled exams for eye and dental .  Patient has checked blood sugar 3 times in the last 14 days with readings 339 range, most recent reading 320.   Slow improvement  toward, increasing compliance with insulin and glucose  monitoring.  Patient reports more comfortable using insulin by syringe rather than pen.  Patient now has her oral medication bottles in a plastic bin and a separate bin for her diabetes supplies, improved  organization reports taking medications on most days. Discussed interest in attending refresher class on diabetes education patient states she will consider after attending other medical  appointment she has scheduled for this month. Will need update to MD regarding progression with monitoring diabetes, verification of insulin dose.   Concern regarding redden area left great toe. Patient will benefit from diabetes foot care education and notify MD of concern .     Plan:  Placed call to triage nurse at [facility], spoke with [name] to inform of patient reports she is currently taking 40 units of Levemir insulin at night when she takes it, patient has concern related to redden tender area above Left great toe nail bed.  Patient requesting to see a Podiatrist.  Provide education on foot care related to diabetes. Will plan return call to patient in the next 2 weeks and sooner when follow up from MD office. Will send PCP this visit note as a update of progress.    [name], RN, [place] Management [phone]- Mobile [phone]- Toll Free Main Office

## 2016-09-03 ENCOUNTER — Other Ambulatory Visit: Payer: Self-pay | Admitting: *Deleted

## 2016-09-03 NOTE — Patient Outreach (Signed)
[facility] [facility]) Care Management  09/03/2016  AREANNA GENGLER 1935/10/08 [id number]   Incoming call from [name] triage nurse from [facility] , to provide information regarding call to office on yesterday with patient concern about left great toe area. She states Charlott Holler NP wants to see patient in office,appointment will include fasting labs. Patient has choice of scheduling to come into office first for labs a couple of days before MD visit or fasting labs and visit at the same appointment. PCP has also requested patient to check blood sugars daily and keep a record.  Placed call to patient to inform her of MD recommendations, patient refers to come into office first for labs then see MD . I have instructed her to call office as recommended by RN.  Reinforced with patient to check blood sugar daily and keep a record to take to office visit. Patient verbalized understanding.   Plan Patient will make MD  appointment and attend visit . Patient will monitor blood sugars.  Will follow up with patient in the next 2 weeks .    [name], RN, [place] Management [phone]- Mobile [phone]- Toll Free Main Office

## 2016-09-05 DIAGNOSIS — H2511 Age-related nuclear cataract, right eye: Secondary | ICD-10-CM | POA: Diagnosis not present

## 2016-09-05 LAB — HM DIABETES EYE EXAM

## 2016-09-11 DIAGNOSIS — S90415S Abrasion, left lesser toe(s), sequela: Secondary | ICD-10-CM | POA: Diagnosis not present

## 2016-09-11 DIAGNOSIS — B341 Enterovirus infection, unspecified: Secondary | ICD-10-CM | POA: Diagnosis not present

## 2016-09-11 DIAGNOSIS — E663 Overweight: Secondary | ICD-10-CM | POA: Diagnosis not present

## 2016-09-11 DIAGNOSIS — E1139 Type 2 diabetes mellitus with other diabetic ophthalmic complication: Secondary | ICD-10-CM | POA: Diagnosis not present

## 2016-09-11 DIAGNOSIS — Z6826 Body mass index (BMI) 26.0-26.9, adult: Secondary | ICD-10-CM | POA: Diagnosis not present

## 2016-09-15 ENCOUNTER — Ambulatory Visit: Payer: Self-pay | Admitting: Licensed Clinical Social Worker

## 2016-09-16 ENCOUNTER — Other Ambulatory Visit: Payer: Self-pay | Admitting: *Deleted

## 2016-09-16 NOTE — Patient Outreach (Signed)
[place] [facility]) Care Management  09/16/2016  Alyssa [name] 1936-02-16 [id number]   Telephone follow call   Placed call to patient she discussed how she has been bothered by allergies, states she is taking her medications for allergies.  Patient discussed she attended office visit for labwork at PCP office and office visit with provider. Patient discussed she is taking her insulin she has obtained refills and is using new vial of insulin. Patient reports she is checking her blood sugar on some days, recalls blood sugar over 300 on yesterday. Discussed with patient her recent A1c result of 11.3 a decrease from March result of 13.3. Patient reports she is now taking 45 units of Levimir insulin at bedtime.   Patient discussed she has upcoming  Right eye cataract surgery on June 19 .  Discussed continued follow up for Diabetes self care management , patient agreeable to follow up call in the 2 weeks after her surgery to discuss next home visit  follow up and eventual transition back to health coach for disease management.   Plan  Will plan follow up call in the next 2 weeks. Reinforced continued importance of taking insulin as prescribed and monitoring blood sugars.    [name], RN, [place] Management [phone]- Mobile [phone]- Toll Free Main Office

## 2016-09-22 ENCOUNTER — Ambulatory Visit (INDEPENDENT_AMBULATORY_CARE_PROVIDER_SITE_OTHER): Payer: PPO | Admitting: Licensed Clinical Social Worker

## 2016-09-22 DIAGNOSIS — F321 Major depressive disorder, single episode, moderate: Secondary | ICD-10-CM | POA: Diagnosis not present

## 2016-09-30 ENCOUNTER — Ambulatory Visit: Admit: 2016-09-30 | Payer: PPO | Admitting: Ophthalmology

## 2016-09-30 SURGERY — PHACOEMULSIFICATION, CATARACT, WITH IOL INSERTION
Anesthesia: Choice | Laterality: Right

## 2016-10-02 ENCOUNTER — Other Ambulatory Visit: Payer: Self-pay | Admitting: *Deleted

## 2016-10-02 NOTE — Patient Outreach (Addendum)
[place] [facility]) Care Management  [place]  10/02/2016   Alyssa [name] 20-Aug-1935 [id number]  Subjective:  Patient discussed she decided against having cataract surgery after reading all the information they provided. Patient discussed she can still read magazine and news paper without problems, she state she will get new prescription for glasses.   Patient reports she is doing better with taking insulin on a more regular basis. Reports blood sugars are still running in the 300 range at times when checked.    Encounter Medications:  Outpatient Encounter Prescriptions as of 10/02/2016  Medication Sig Note  . aspirin 81 MG tablet Take 81 mg by mouth daily.   [name] atorvastatin (LIPITOR) 80 MG tablet Take 1 tablet (80 mg total) by mouth daily at 6 PM.   . carvedilol (COREG) 3.125 MG tablet Take 3.125 mg by mouth 2 (two) times daily with a meal.  04/02/2014: .  [name] Cholecalciferol (CVS D3) 2000 units CAPS Take 1 capsule by mouth daily.   . clopidogrel (PLAVIX) 75 MG tablet Take 1 tablet (75 mg total) by mouth daily.   . Coenzyme Q10 (COQ10 PO) Take 1 capsule by mouth daily. Reported on 10/11/2015   . fluticasone (FLONASE) 50 MCG/ACT nasal spray Place 2 sprays into both nostrils daily.   . furosemide (LASIX) 20 MG tablet Take 20 mg by mouth daily.  04/02/2014: .  . glimepiride (AMARYL) 4 MG tablet Take 4 mg by mouth daily with breakfast. Reported on 11/01/2015 12/11/2015: Not taking  . hydrOXYzine (VISTARIL) 25 MG capsule    . insulin detemir (LEVEMIR) 100 UNIT/ML injection Inject 30 Units into the skin at bedtime. Patient reports taking 40 units each night   . levothyroxine (SYNTHROID, LEVOTHROID) 125 MCG tablet Take 125 mcg by mouth daily before breakfast.    . levothyroxine (SYNTHROID, LEVOTHROID) 125 MCG tablet Take 125 mcg by mouth daily.   [name] losartan (COZAAR) 50 MG tablet Take 1 tablet (50 mg total) by mouth daily. 07/17/2016: Currently out, called pharmacy for refill,  pharmacy to call MD office  for renewal.  . metFORMIN (GLUCOPHAGE) 500 MG tablet Take 1 tablet (500 mg total) by mouth 2 (two) times daily with a meal. (Patient not taking: Reported on 05/21/2016)   . nitroGLYCERIN (NITROSTAT) 0.4 MG SL tablet Place 0.4 mg under the tongue every 5 (five) minutes as needed for chest pain. Reported on 10/04/2015 04/02/2014: .  . polyethylene glycol (MIRALAX / GLYCOLAX) packet Take 17 g by mouth daily as needed (constipation). Reported on 10/04/2015   . ranitidine (ZANTAC) 150 MG tablet Take 150 mg by mouth 2 (two) times daily.   [name] spironolactone (ALDACTONE) 50 MG tablet daily. 1/2 tablet daily   . vitamin B-12 (CYANOCOBALAMIN) 1000 MCG tablet Take 1,000 mcg by mouth daily.   [name] ZETIA 10 MG tablet TAKE 1 TABLET BY MOUTH EVERY DAY    No facility-administered encounter medications on file as of 10/02/2016.     Functional Status:  In your present state of health, do you have any difficulty performing the following activities: 10/02/2016 06/25/2016  Hearing? N N  Vision? N N  Difficulty concentrating or making decisions? [name]  Walking or climbing stairs? Y Y  Dressing or bathing? N N  Doing errands, shopping? [name]  Preparing Food and eating ? N N  Using the Toilet? N N  In the past six months, have you accidently leaked urine? N N  Do you have problems with loss of  bowel control? N N  Managing your Medications? Y Y  Managing your Finances? N N  Housekeeping or managing your Housekeeping? Y Y  Some recent data might be hidden    Fall/Depression Screening: Fall Risk  10/02/2016 06/25/2016 05/08/2016  Falls in the past year? Yes Yes Yes  Number falls in past yr: 1 1 1   Injury with Fall? No No No  Risk for fall due to : History of fall(s) History of fall(s) Impaired balance/gait  Risk for fall due to (comments): - - -  Follow up Falls prevention discussed Falls prevention discussed Falls evaluation completed;Falls prevention discussed;Education provided   [facility] 2/9  Scores 08/19/2016 06/25/2016 05/08/2016 04/15/2016 10/01/2015 09/20/2015 08/07/2015  PHQ - 2 Score 2 6 2 2 6 5 1   PHQ- 9 Score 5 12 6 7 14 8  -  Exception Documentation - - - - - (No Data) -    Assessment:   Diabetes Reported increased consistency in taking insulin  , has missed one day this week. Checking blood sugar 2 to 3 times a week. Denies hypoglycemia symptoms . Reviewed importance of eye health and follow related to diabetes  Patient declining need to attend Diabetes education class for review at this time . Will benefit from education reinforcement and support.   Patient discussed she still wants to work on goals of managing her blood sugar.Agreeable to home visit in the next month for follow up, discussed health coach program for  Management of chronic diseases such as diabetes,  patient states she would be interested in that follow up as part of plan.   Plan Will schedule home visit in the next month Patient will continue to monitor her blood sugar and take insulin as prescribed. Patient collaborative  care planning goals reviewed and updated. [name], RN, [place] Management [phone]- Mobile [phone]- Toll Free Main Office   [facility] CM Care Plan Problem One     Most Recent Value  Care Plan Problem One  Knowledge deficit related Diabetes self care management as evidenced by elevated blood sugar readings   Role Documenting the Problem One  Care Management Coordinator  Care Plan for Problem One  Active  [facility] Long Term Goal   Patient will report increase knowledge of Diabetes self care management in the next 60 days [goal date restated ]  [facility] Long Term Goal Start Date  09/02/16  Interventions for Problem One Long Term Goal  Instructed regarding importance of taking medications as prescribed, keeping track of blood sugars , discussed how blood sugar controll helps prevent further complications with body systems   [facility] CM Short Term Goal #1   Patient will be able  to report checking blood sugar at least 3 days a week  and recording in the next 30 days  [facility] CM Short Term Goal #1 Start Date  10/02/16 [goal restarted]  Interventions for Short Term Goal #1  Again explained importance of monitoring blood sugar, with taking insulin .   [facility] CM Short Term Goal #2   Patient will report increase knowledge of Diabetes foot care in the next 30 days  [goal restated ]  [facility] CM Short Term Goal #2 Start Date  09/02/16  [facility] CM Short Term Goal #2 Met Date  10/02/16  Interventions for Short Term Goal #2     [facility] CM Short Term Goal #3  Patient will report getting syringes and strips refill in the next 14 days   [facility]  CM Short Term Goal #3 Start Date  07/02/16  [facility] CM Short Term Goal #3 Met Date  07/14/16  [facility] CM Short Term Goal #4  Patient will report using levemir insulin pin in the next 14 days   [facility] CM Short Term Goal #4 Start Date  07/17/16 [name] date restarted ]

## 2016-10-09 ENCOUNTER — Other Ambulatory Visit: Payer: Self-pay | Admitting: *Deleted

## 2016-10-09 MED ORDER — CLOPIDOGREL BISULFATE 75 MG PO TABS: 75.0000 mg | ORAL_TABLET | Freq: Every day | ORAL | 1 refills | Status: DC

## 2016-10-22 ENCOUNTER — Ambulatory Visit: Payer: Self-pay | Admitting: *Deleted

## 2016-10-22 ENCOUNTER — Ambulatory Visit (HOSPITAL_COMMUNITY)
Admission: EM | Admit: 2016-10-22 | Discharge: 2016-10-22 | Disposition: A | Discharge status: Nursing Home (Includes ICF) | Payer: PPO | Source: Home / Self Care

## 2016-10-22 ENCOUNTER — Encounter (HOSPITAL_COMMUNITY): Payer: Self-pay | Admitting: Emergency Medicine

## 2016-10-22 ENCOUNTER — Other Ambulatory Visit: Payer: Self-pay | Admitting: *Deleted

## 2016-10-22 ENCOUNTER — Emergency Department (HOSPITAL_COMMUNITY)
Admission: EM | Admit: 2016-10-22 | Discharge: 2016-10-22 | Disposition: A | Discharge status: Home / Self Care | Payer: PPO | Attending: Emergency Medicine | Admitting: Emergency Medicine

## 2016-10-22 ENCOUNTER — Emergency Department (HOSPITAL_COMMUNITY): Payer: PPO

## 2016-10-22 DIAGNOSIS — Z87891 Personal history of nicotine dependence: Secondary | ICD-10-CM | POA: Insufficient documentation

## 2016-10-22 DIAGNOSIS — E119 Type 2 diabetes mellitus without complications: Secondary | ICD-10-CM | POA: Insufficient documentation

## 2016-10-22 DIAGNOSIS — Z794 Long term (current) use of insulin: Secondary | ICD-10-CM | POA: Insufficient documentation

## 2016-10-22 DIAGNOSIS — N390 Urinary tract infection, site not specified: Secondary | ICD-10-CM | POA: Insufficient documentation

## 2016-10-22 DIAGNOSIS — J320 Chronic maxillary sinusitis: Secondary | ICD-10-CM | POA: Diagnosis not present

## 2016-10-22 DIAGNOSIS — Z79899 Other long term (current) drug therapy: Secondary | ICD-10-CM | POA: Insufficient documentation

## 2016-10-22 DIAGNOSIS — I5032 Chronic diastolic (congestive) heart failure: Secondary | ICD-10-CM | POA: Insufficient documentation

## 2016-10-22 DIAGNOSIS — I110 Hypertensive heart disease with heart failure: Secondary | ICD-10-CM | POA: Diagnosis not present

## 2016-10-22 DIAGNOSIS — R55 Syncope and collapse: Secondary | ICD-10-CM | POA: Insufficient documentation

## 2016-10-22 DIAGNOSIS — I2510 Atherosclerotic heart disease of native coronary artery without angina pectoris: Secondary | ICD-10-CM | POA: Insufficient documentation

## 2016-10-22 DIAGNOSIS — J329 Chronic sinusitis, unspecified: Secondary | ICD-10-CM | POA: Diagnosis not present

## 2016-10-22 DIAGNOSIS — E039 Hypothyroidism, unspecified: Secondary | ICD-10-CM | POA: Insufficient documentation

## 2016-10-22 DIAGNOSIS — Z7902 Long term (current) use of antithrombotics/antiplatelets: Secondary | ICD-10-CM | POA: Insufficient documentation

## 2016-10-22 DIAGNOSIS — R42 Dizziness and giddiness: Secondary | ICD-10-CM

## 2016-10-22 DIAGNOSIS — Z7984 Long term (current) use of oral hypoglycemic drugs: Secondary | ICD-10-CM | POA: Insufficient documentation

## 2016-10-22 DIAGNOSIS — I252 Old myocardial infarction: Secondary | ICD-10-CM | POA: Diagnosis not present

## 2016-10-22 LAB — HEPATIC FUNCTION PANEL
ALT: 36 U/L (ref 14–54)
AST: 28 U/L (ref 15–41)
Albumin: 3.7 g/dL (ref 3.5–5.0)
Alkaline Phosphatase: 83 U/L (ref 38–126)
BILIRUBIN TOTAL: 0.6 mg/dL (ref 0.3–1.2)
Total Protein: 6.5 g/dL (ref 6.5–8.1)

## 2016-10-22 LAB — URINALYSIS, ROUTINE W REFLEX MICROSCOPIC
BILIRUBIN URINE: NEGATIVE
Bacteria, UA: NONE SEEN
HGB URINE DIPSTICK: NEGATIVE
KETONES UR: NEGATIVE mg/dL
NITRITE: NEGATIVE
PH: 6 (ref 5.0–8.0)
PROTEIN: NEGATIVE mg/dL
Specific Gravity, Urine: 1.005 (ref 1.005–1.030)

## 2016-10-22 LAB — CBC
HCT: 40.6 % (ref 36.0–46.0)
Hemoglobin: 13.1 g/dL (ref 12.0–15.0)
MCH: 27 pg (ref 26.0–34.0)
MCHC: 32.3 g/dL (ref 30.0–36.0)
MCV: 83.5 fL (ref 78.0–100.0)
PLATELETS: 252 10*3/uL (ref 150–400)
RBC: 4.86 MIL/uL (ref 3.87–5.11)
RDW: 14.8 % (ref 11.5–15.5)
WBC: 9 10*3/uL (ref 4.0–10.5)

## 2016-10-22 LAB — MAGNESIUM: Magnesium: 1.9 mg/dL (ref 1.7–2.4)

## 2016-10-22 LAB — BASIC METABOLIC PANEL
Anion gap: 8 (ref 5–15)
BUN: 13 mg/dL (ref 6–20)
CHLORIDE: 100 mmol/L — AB (ref 101–111)
CO2: 22 mmol/L (ref 22–32)
CREATININE: 0.66 mg/dL (ref 0.44–1.00)
Calcium: 9 mg/dL (ref 8.9–10.3)
GFR calc Af Amer: >60 mL/min (ref >60)
Glucose, Bld: 268 mg/dL — ABNORMAL HIGH (ref 65–99)
Potassium: 4 mmol/L (ref 3.5–5.1)
SODIUM: 130 mmol/L — AB (ref 135–145)

## 2016-10-22 LAB — BRAIN NATRIURETIC PEPTIDE: B NATRIURETIC PEPTIDE 5: 36.8 pg/mL (ref 0.0–100.0)

## 2016-10-22 LAB — I-STAT TROPONIN, ED: TROPONIN I, POC: 0 ng/mL (ref 0.00–0.08)

## 2016-10-22 MED ORDER — MECLIZINE HCL 25 MG PO TABS: 25.0000 mg | ORAL_TABLET | Freq: Three times a day (TID) | ORAL | 0 refills | Status: DC | PRN

## 2016-10-22 MED ORDER — AMOXICILLIN-POT CLAVULANATE 875-125 MG PO TABS: 1.0000 | ORAL_TABLET | Freq: Two times a day (BID) | ORAL | 0 refills | Status: DC

## 2016-10-22 NOTE — ED Triage Notes (Signed)
Woke up really dizzy yesterday even before she lifted her head fro pillow, it cont all day yesterdfay and then she fell yesterday afternoon hit her head, no loc she states, today she is here because she is still dizzy  But not as bad as yesterday . Also has an area on her forehead that is odd x a couple of months  it is not from rhe fall yesterday .

## 2016-10-22 NOTE — ED Provider Notes (Signed)
[facility] DEPT Provider Note   CSN: [id number] Arrival date & time: 10/22/16  1408     History   Chief Complaint Chief Complaint  Patient presents with  . Dizziness  . Fall    HPI Alyssa [name] is a 81 y.o. female.  HPI Patient states that yesterday morning before getting out of bed she became dizzy. Describes dizziness as spinning sensation. She attempted to get up to go to the bathroom and was unsteady. She then fell to the floor. No head injury or loss of consciousness. She was dizzy throughout the day. Her dizziness is improved today. She denies any neck pain, chest pain or shortness of breath. Denies tinnitus, nasal congestion or sinus pressure. No focal weakness or numbness. No visual changes. Past Medical History:  Diagnosis Date  . Anemia   . CAD (coronary artery disease)    a. 2013 NSTEMI s/p DES to LAD. b. 04/03/14: NSTEMI s/p DES to OM1, DES to dRCA.  [name] Chronic diastolic CHF (congestive heart failure) ([place])    a. 2D ECHO 04/02/14 with hypokinesis in the basal inferior and inferoseptal walls. Focal and moderate concentric LVH. Overall LVEF 60-65%. G1DD. Elevated EDLV filling pressures. Mild TR.  [name] GERD (gastroesophageal reflux disease)   . History of GI bleed    a. 2013: adm for ABL anemia. 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  [name] HLD (hyperlipidemia)   . HTN (hypertension)   . Hypothyroid   . Peripheral vascular disease ([place])   . Refusal of blood transfusions as patient is Jehovah's Witness 09-29-12  . Type II diabetes mellitus [facility])     Patient Active Problem List   Diagnosis Date Noted  . Hypertensive heart disease 04/11/2014  . Anemia   . History of GI bleed   . CAD (coronary artery disease)   . HLD (hyperlipidemia)   . Peripheral vascular disease ([place])   . Chronic diastolic CHF (congestive heart failure) ([place])   . Essential hypertension   . NSTEMI  (non-ST elevated myocardial infarction) ([place]) 04/01/2014  . Refusal of blood transfusions as patient is Jehovah's Witness 09/29/2012  . Blood loss anemia 02/26/2012  . Melena 02/25/2012  . GERD (gastroesophageal reflux disease) 11/11/2011  . DM type 2 (diabetes mellitus, type 2) ([place]) 05/26/2011  . Hypothyroid     Past Surgical History:  Procedure Laterality Date  . CESAREAN SECTION  ~ 1961; 1966   plus 3 NVD  . COLONOSCOPY  02/27/2012   Procedure: COLONOSCOPY;  Surgeon: Lear Ng, MD;  Location: [facility] ENDOSCOPY;  Service: Endoscopy;  Laterality: N/A;  . CORONARY ANGIOPLASTY WITH STENT PLACEMENT  05/2011; 04/03/2014  . ESOPHAGOGASTRODUODENOSCOPY  02/26/2012   Procedure: ESOPHAGOGASTRODUODENOSCOPY (EGD);  Surgeon: Lear Ng, MD;  Location: [facility] ENDOSCOPY;  Service: Endoscopy;  Laterality: N/A;  . LEFT HEART CATHETERIZATION WITH CORONARY ANGIOGRAM N/A 06/12/2011   Procedure: LEFT HEART CATHETERIZATION WITH CORONARY ANGIOGRAM;  Surgeon: Josue Hector, MD;  Location: [facility] CATH LAB;  Service: Cardiovascular;  Laterality: N/A;  . LEFT HEART CATHETERIZATION WITH CORONARY ANGIOGRAM N/A 04/03/2014   Procedure: LEFT HEART CATHETERIZATION WITH CORONARY ANGIOGRAM;  Surgeon: Jettie Booze, MD;  Location: [facility] CATH LAB;  Service: Cardiovascular;  Laterality: N/A;  . TONSILLECTOMY    . VAGINAL HYSTERECTOMY      OB History    No data available       Home Medications    Prior to Admission medications  Medication Sig Start Date End Date Taking? Authorizing Provider  aspirin 81 MG tablet Take 81 mg by mouth daily.   Yes [provider]  atorvastatin (LIPITOR) 80 MG tablet Take 1 tablet (80 mg total) by mouth daily at 6 PM. 04/04/14  Yes Eileen Stanford, PA-C  azelastine (ASTELIN) 0.1 % nasal spray Place 2 sprays into both nostrils daily as needed for allergies. 07/23/16  Yes [provider]  carvedilol (COREG) 3.125 MG tablet Take 3.125 mg by mouth 2 (two)  times daily with a meal.  07/04/12  Yes [provider]  Cholecalciferol ([employer] D3) 2000 units CAPS Take 1 capsule by mouth daily.   Yes [provider]  clopidogrel (PLAVIX) 75 MG tablet Take 1 tablet (75 mg total) by mouth daily. 10/09/16  Yes Turner, Eber Hong, MD  Coenzyme Q10 (COQ10 PO) Take 1 capsule by mouth daily. Reported on 10/11/2015   Yes [provider]  fluticasone (FLONASE) 50 MCG/ACT nasal spray Place 2 sprays into both nostrils daily.   Yes [provider]  furosemide (LASIX) 20 MG tablet Take 20 mg by mouth daily.    Yes [provider]  hydrOXYzine (VISTARIL) 25 MG capsule 25 mg every 6 (six) hours as needed for itching.  02/18/16  Yes [provider]  ibuprofen (ADVIL,MOTRIN) 200 MG tablet Take 400 mg by mouth every 6 (six) hours as needed for mild pain.   Yes [provider]  insulin detemir (LEVEMIR) 100 UNIT/ML injection Inject 50 Units into the skin at bedtime.    Yes [provider]  levocetirizine (XYZAL) 5 MG tablet Take 5 mg by mouth at bedtime. 07/24/16  Yes [provider]  levothyroxine (SYNTHROID, LEVOTHROID) 125 MCG tablet Take 125 mcg by mouth daily. 07/05/15  Yes [provider]  loratadine (CLARITIN) 10 MG tablet Take 10 mg by mouth daily.   Yes [provider]  losartan (COZAAR) 50 MG tablet Take 1 tablet (50 mg total) by mouth daily. 04/11/14  Yes Dunn, Dayna N, PA-C  nitroGLYCERIN (NITROSTAT) 0.4 MG SL tablet Place 0.4 mg under the tongue every 5 (five) minutes as needed for chest pain. Reported on 10/04/2015   Yes [provider]  OVER THE COUNTER MEDICATION Take 2 tablets by mouth daily as needed (leg cramps). hyland's homeopathic leg cramps   Yes [provider]  polyethylene glycol (MIRALAX / GLYCOLAX) packet Take 17 g by mouth daily as needed (constipation). Reported on 10/04/2015   Yes [provider]  spironolactone (ALDACTONE) 50 MG tablet  Take 25 mg by mouth daily.  05/06/16  Yes [provider]  vitamin B-12 (CYANOCOBALAMIN) 1000 MCG tablet Take 1,000 mcg by mouth daily.   Yes [provider]  ZETIA 10 MG tablet TAKE 1 TABLET BY MOUTH EVERY DAY 03/05/15  Yes Turner, Eber Hong, MD  amoxicillin-clavulanate (AUGMENTIN) 875-125 MG tablet Take 1 tablet by mouth 2 (two) times daily. One po bid x 7 days 10/22/16   Julianne Rice, MD  glimepiride (AMARYL) 4 MG tablet Take 4 mg by mouth daily with breakfast. Reported on 11/01/2015    [provider]  meclizine (ANTIVERT) 25 MG tablet Take 1 tablet (25 mg total) by mouth 3 (three) times daily as needed for dizziness. 10/22/16   Julianne Rice, MD  metFORMIN (GLUCOPHAGE) 500 MG tablet Take 1 tablet (500 mg total) by mouth 2 (two) times daily with a meal. Patient not taking: Reported on 05/21/2016 04/06/14   Eileen Stanford,  PA-C    Family History Family History  Problem Relation Age of Onset  . Arthritis Father   . Heart disease Mother   . Varicose Veins Mother   . Hypertension Unknown     Social History Social History  Substance Use Topics  . Smoking status: Former Smoker    Packs/day: 1.50    Years: 16.00    Types: Cigarettes    Quit date: 03/15/1970  . Smokeless tobacco: Never Used  . Alcohol use 1.8 - 2.4 oz/week    3 - 4 Standard drinks or equivalent per week     Comment: 04/03/2014 "glass of wine or a beer 2-3 times/month"     Allergies   Patient has no known allergies.   Review of Systems Review of Systems  Constitutional: Negative for appetite change, chills and fever.  HENT: Negative for congestion, ear pain, sinus pain, sinus pressure and trouble swallowing.   Eyes: Negative for visual disturbance.  Respiratory: Negative for cough and shortness of breath.   Cardiovascular: Negative for chest pain, palpitations and leg swelling.  Gastrointestinal: Negative for abdominal pain, diarrhea, nausea and vomiting.  Genitourinary: Negative  for difficulty urinating, dysuria, flank pain and frequency.  Musculoskeletal: Positive for gait problem. Negative for back pain, myalgias, neck pain and neck stiffness.  Skin: Negative for rash.  Neurological: Positive for dizziness. Negative for syncope, speech difficulty, weakness, light-headedness and headaches.  Psychiatric/Behavioral: Negative for confusion.  All other systems reviewed and are negative.    Physical Exam Updated Vital Signs BP 128/61   Pulse 78   Temp 97.8 F (36.6 C) (Oral)   Resp 20   Ht 5\' 1"  (1.549 m)   Wt 68 kg (150 lb)   SpO2 99%   BMI 28.34 kg/m   Physical Exam  Constitutional: She is oriented to person, place, and time. She appears well-developed and well-nourished. No distress.  HENT:  Head: Normocephalic and atraumatic.  Mouth/Throat: Oropharynx is clear and moist. No oropharyngeal exudate.  No obvious head injury. No hemotympanum. Midface is stable.  Eyes: EOM are normal. Pupils are equal, round, and reactive to light.  Few beats of horizontal nystagmus which is fatigable.  Neck: Normal range of motion. Neck supple.  No posterior midline cervical tenderness to palpation. No meningismus.  Cardiovascular: Normal rate and regular rhythm.  Exam reveals no gallop and no friction rub.   No murmur heard. Pulmonary/Chest: Effort normal and breath sounds normal. No respiratory distress. She has no wheezes. She has no rales. She exhibits no tenderness.  Abdominal: Soft. Bowel sounds are normal. There is no tenderness. There is no rebound and no guarding.  Musculoskeletal: Normal range of motion. She exhibits no edema or tenderness.  No lower extremity swelling, asymmetry or tenderness. Distal pulses are 2+.  Neurological: She is alert and oriented to person, place, and time.  5/5 motor in all extremities. Sensation fully intact. Bilateral finger-to-nose testing intact.  Skin: Skin is warm and dry. Capillary refill takes less than 2 seconds. No rash  noted. She is not diaphoretic. No erythema.  Psychiatric: She has a normal mood and affect. Her behavior is normal.  Nursing note and vitals reviewed.    ED Treatments / Results  Labs (all labs ordered are listed, but only abnormal results are displayed) Labs Reviewed  BASIC METABOLIC PANEL - Abnormal; Notable for the following:       Result Value   Sodium 130 (*)    Chloride 100 (*)    Glucose, Bld  268 (*)    All other components within normal limits  URINALYSIS, ROUTINE W REFLEX MICROSCOPIC - Abnormal; Notable for the following:    Color, Urine STRAW (*)    Glucose, UA >=500 (*)    Leukocytes, UA MODERATE (*)    Squamous Epithelial / LPF 0-5 (*)    All other components within normal limits  HEPATIC FUNCTION PANEL - Abnormal; Notable for the following:    Bilirubin, Direct <0.1 (*)    All other components within normal limits  URINE CULTURE  CBC  BRAIN NATRIURETIC PEPTIDE  MAGNESIUM  CBG MONITORING, ED  I-STAT TROPOININ, ED    EKG  EKG Interpretation  Date/Time:  Wednesday October 22 2016 14:53:35 EDT Ventricular Rate:  74 PR Interval:  176 QRS Duration: 80 QT Interval:  386 QTC Calculation: 428 R Axis:   25 Text Interpretation:  Normal sinus rhythm Low voltage QRS Cannot rule out Anterior infarct , age undetermined Abnormal ECG Confirmed by Nat Christen [id number]) on 10/23/2016 1:02:09 PM       Radiology Mr Brain Wo Contrast  Result Date: 10/22/2016 CLINICAL DATA:  Dizziness for 2 days, fell and hit head this afternoon. No loss of consciousness. History of hypertension, hyperlipidemia, melanoma. EXAM: MRI HEAD WITHOUT CONTRAST TECHNIQUE: Multiplanar, multiecho pulse sequences of the brain and surrounding structures were obtained without intravenous contrast. COMPARISON:  CT HEAD February 25, 2012 FINDINGS: BRAIN: No reduced diffusion to suggest acute ischemia. No susceptibility artifact to suggest acute hemorrhage . LEFT parafalcine chronic microhemorrhage. Old Bilateral  basal ganglia and LEFT thalamus lacunar infarcts, stable considering differences in imaging technique. Patchy supratentorial white matter FLAIR T2 hyperintensities. The ventricles and sulci are normal for patient's age. No suspicious parenchymal signal, mass or mass effect. No abnormal extra-axial fluid collections. VASCULAR: Normal major intracranial vascular flow voids present at skull base. SKULL AND UPPER CERVICAL SPINE: No abnormal sellar expansion. No suspicious calvarial bone marrow signal. Craniocervical junction maintained. Suspected moderate C5-6 degenerative disc, incompletely assessed. SINUSES/ORBITS: Chronic RIGHT maxillary sinusitis. The included ocular globes and orbital contents are non-suspicious. OTHER: None. IMPRESSION: 1. No acute intracranial process. 2. Old lacunar infarcts and, moderate chronic small vessel ischemic disease. 3. Chronic RIGHT maxillary sinusitis. Electronically Signed   By: Elon Alas M.D.   On: 10/22/2016 19:48    Procedures Procedures (including critical care time)  Medications Ordered in ED Medications - No data to display   Initial Impression / Assessment and Plan / ED Course  I have reviewed the triage vital signs and the nursing notes.  Pertinent labs & imaging results that were available during my care of the patient were reviewed by me and considered in my medical decision making (see chart for details).    Patient states she is feeling much better. Currently asymptomatic. Normal neurologic exam. MRI without acute findings. Patient does have chronic right maxillary sinusitis. Vertiginous symptoms likely peripheral. Admits to having severe allergies. She is currently on Claritin and Flonase. Will give prescription for meclizineAnd antibiotic. Question UTI. Urine was sent for culture. Also advised follow-up with ENT. Return precautions given.   Final Clinical Impressions(s) / ED Diagnoses   Final diagnoses:  Vertigo  Maxillary sinusitis,  chronic  Lower urinary tract infectious disease    New Prescriptions Discharge Medication List as of 10/22/2016  9:14 PM    START taking these medications   Details  amoxicillin-clavulanate (AUGMENTIN) 875-125 MG tablet Take 1 tablet by mouth 2 (two) times daily. One po bid x 7 days, Starting  Wed 10/22/2016, Print    meclizine (ANTIVERT) 25 MG tablet Take 1 tablet (25 mg total) by mouth 3 (three) times daily as needed for dizziness., Starting Wed 10/22/2016, Print         Julianne Rice, MD 10/23/16 [phone]

## 2016-10-22 NOTE — Patient Outreach (Addendum)
[facility] [facility] [place] [facility]) Care Management  10/22/2016  Alyssa [name] [birth year] [id number]   Follow up telephone call  Received incoming call from Cheree Ditto, [place] to report patient call to office to regarding rescheduling home visit scheduled for today. Placed call to patient she reports recent problems with feeling dizzy headed, and fall on yesterday, denies follow up with PCP. Patient states she did check her blood sugar at this  episode.  She states her daughter has made and appointment with a Doctor in [place] today to follow up on her symptoms and will provide transportation to visit . Patient is unsure of the name of MD.   Patient reports she is still taking her insulin but not checking blood sugar.  Patient agreeable to follow up call in the next 3 days to reschedule home visit .  Plan Will schedule  return call to patient in the next 3 days to reschedule home visit. Encouraged patient regarding taking insulin and all medication as prescribed and monitoring blood sugars .  [name], RN, [facility] Management [phone]- Mobile [phone]- Toll Free Main Office

## 2016-10-22 NOTE — ED Notes (Signed)
Patient transported to MRI 

## 2016-10-24 ENCOUNTER — Other Ambulatory Visit: Payer: Self-pay | Admitting: *Deleted

## 2016-10-24 LAB — URINE CULTURE

## 2016-10-24 NOTE — Patient Outreach (Signed)
[place] [facility]) Care Management  10/24/2016  Alyssa [name] 1935-12-11 [id number]  Follow up telephone call  Spoke with patient , discussed recent visit to ED related to recent dizziness and fall at home.  Patient reports she is feeling better denies symptoms of dizziness, or swimmy head feeling as 3 days ago.  Patient reports she is taking antibiotic as prescribed for sinusitis and possible UTI. She reports she has not had to take meclizine for vertigo  Discussed with patient regarding whether she had made follow up PCP and ENT she states no but she will do it today, offered assistance with making appointment remarks she will do it, verified she had contact information Dr.Teoh .   Patient reports that she is taking her insulin as prescribed and checked her blood sugar on yesterday states it was just over 200.   Plan Patient agreeable to follow up phone call in the next 2 weeks to then arrange home visit for follow up on Diabetes support and education .  Reviewed of fall prevention measures. Patient will schedule post ED PCP office visit.    [name], RN, [facility] Management [phone]- Mobile [phone]- Toll Free Main Office

## 2016-10-25 ENCOUNTER — Telehealth: Payer: Self-pay

## 2016-10-25 NOTE — Telephone Encounter (Signed)
No further treatment needed for [facility] ED 10/22/16 per [facility]

## 2016-10-29 DIAGNOSIS — I679 Cerebrovascular disease, unspecified: Secondary | ICD-10-CM | POA: Diagnosis not present

## 2016-10-29 DIAGNOSIS — R42 Dizziness and giddiness: Secondary | ICD-10-CM | POA: Diagnosis not present

## 2016-10-29 DIAGNOSIS — E1139 Type 2 diabetes mellitus with other diabetic ophthalmic complication: Secondary | ICD-10-CM | POA: Diagnosis not present

## 2016-10-29 DIAGNOSIS — J320 Chronic maxillary sinusitis: Secondary | ICD-10-CM | POA: Diagnosis not present

## 2016-10-29 DIAGNOSIS — Z79899 Other long term (current) drug therapy: Secondary | ICD-10-CM | POA: Diagnosis not present

## 2016-10-29 DIAGNOSIS — G3184 Mild cognitive impairment, so stated: Secondary | ICD-10-CM | POA: Diagnosis not present

## 2016-10-29 DIAGNOSIS — Z6828 Body mass index (BMI) 28.0-28.9, adult: Secondary | ICD-10-CM | POA: Diagnosis not present

## 2016-11-06 ENCOUNTER — Other Ambulatory Visit: Payer: Self-pay | Admitting: *Deleted

## 2016-11-06 NOTE — Patient Outreach (Addendum)
[place] [facility]) Care Management  11/06/2016  Alyssa [name] [birth year] [id number]   Placed scheduled follow up call to patient , no answer unable to leave a message.   Plan  Will attempt contact again in the next week.   [name], RN, [facility] Management [phone]- Mobile [phone]- Toll Free Main Office

## 2016-11-11 ENCOUNTER — Encounter: Payer: Self-pay | Admitting: *Deleted

## 2016-11-11 ENCOUNTER — Other Ambulatory Visit: Payer: Self-pay | Admitting: *Deleted

## 2016-11-11 NOTE — Patient Outreach (Signed)
[facility] [facility]) Care Management  11/11/2016  Alyssa [name] [birth year] [id number]   Telephone follow up visit   Spoke with patient reports she is doing fairly well, she discussed that she has been checking her blood sugar daily, most nights before she takes her insulin. Reports taking insulin on most nights, reports blood sugar readings improved running just over 200. Review hypoglycemia symptoms and treatment ,denies recent episode.   Patient discussed insulin pen and requested further education on using pen.   Patient denies any further dizzy or vertigo symptoms, no falls. Patient reports her daughter has arranged for her to see a new primary care doctor at [facility] on next week.  Patient agreeable to home visit for Diabetes education and use of insulin pen, request to schedule visit after she sees her new doctor.  Plan Will schedule home visit in the next 2 weeks. Patient will continue to monitor blood sugar and take insulin and medications as prescribed.   Alyssa Draft, RN, [place] Management Coordinator  [phone]- Mobile [phone]- Toll Free Main Office

## 2016-11-16 DIAGNOSIS — R21 Rash and other nonspecific skin eruption: Secondary | ICD-10-CM | POA: Diagnosis not present

## 2016-11-18 ENCOUNTER — Emergency Department (HOSPITAL_COMMUNITY): Payer: PPO

## 2016-11-18 ENCOUNTER — Emergency Department (HOSPITAL_COMMUNITY)
Admission: EM | Admit: 2016-11-18 | Discharge: 2016-11-18 | Disposition: A | Discharge status: Home / Self Care | Payer: PPO | Attending: Emergency Medicine | Admitting: Emergency Medicine

## 2016-11-18 DIAGNOSIS — E119 Type 2 diabetes mellitus without complications: Secondary | ICD-10-CM | POA: Insufficient documentation

## 2016-11-18 DIAGNOSIS — E785 Hyperlipidemia, unspecified: Secondary | ICD-10-CM | POA: Diagnosis present

## 2016-11-18 DIAGNOSIS — Z794 Long term (current) use of insulin: Secondary | ICD-10-CM | POA: Insufficient documentation

## 2016-11-18 DIAGNOSIS — I2510 Atherosclerotic heart disease of native coronary artery without angina pectoris: Secondary | ICD-10-CM | POA: Diagnosis not present

## 2016-11-18 DIAGNOSIS — I5032 Chronic diastolic (congestive) heart failure: Secondary | ICD-10-CM | POA: Diagnosis present

## 2016-11-18 DIAGNOSIS — Z955 Presence of coronary angioplasty implant and graft: Secondary | ICD-10-CM | POA: Diagnosis not present

## 2016-11-18 DIAGNOSIS — I110 Hypertensive heart disease with heart failure: Secondary | ICD-10-CM | POA: Diagnosis not present

## 2016-11-18 DIAGNOSIS — R079 Chest pain, unspecified: Secondary | ICD-10-CM | POA: Diagnosis not present

## 2016-11-18 DIAGNOSIS — Z87891 Personal history of nicotine dependence: Secondary | ICD-10-CM | POA: Insufficient documentation

## 2016-11-18 DIAGNOSIS — Z7982 Long term (current) use of aspirin: Secondary | ICD-10-CM | POA: Diagnosis not present

## 2016-11-18 DIAGNOSIS — R0789 Other chest pain: Secondary | ICD-10-CM

## 2016-11-18 DIAGNOSIS — Z79899 Other long term (current) drug therapy: Secondary | ICD-10-CM | POA: Diagnosis not present

## 2016-11-18 DIAGNOSIS — E039 Hypothyroidism, unspecified: Secondary | ICD-10-CM | POA: Diagnosis not present

## 2016-11-18 DIAGNOSIS — Z531 Procedure and treatment not carried out because of patient's decision for reasons of belief and group pressure: Secondary | ICD-10-CM

## 2016-11-18 DIAGNOSIS — Z7902 Long term (current) use of antithrombotics/antiplatelets: Secondary | ICD-10-CM | POA: Diagnosis not present

## 2016-11-18 DIAGNOSIS — I10 Essential (primary) hypertension: Secondary | ICD-10-CM | POA: Diagnosis present

## 2016-11-18 LAB — CBC
HEMATOCRIT: 41.8 % (ref 36.0–46.0)
Hemoglobin: 13.5 g/dL (ref 12.0–15.0)
MCH: 26.7 pg (ref 26.0–34.0)
MCHC: 32.3 g/dL (ref 30.0–36.0)
MCV: 82.8 fL (ref 78.0–100.0)
PLATELETS: 224 10*3/uL (ref 150–400)
RBC: 5.05 MIL/uL (ref 3.87–5.11)
RDW: 14.3 % (ref 11.5–15.5)
WBC: 8.6 10*3/uL (ref 4.0–10.5)

## 2016-11-18 LAB — BASIC METABOLIC PANEL
Anion gap: 9 (ref 5–15)
BUN: 12 mg/dL (ref 6–20)
CHLORIDE: 106 mmol/L (ref 101–111)
CO2: 25 mmol/L (ref 22–32)
CREATININE: 0.72 mg/dL (ref 0.44–1.00)
Calcium: 9.3 mg/dL (ref 8.9–10.3)
GFR calc Af Amer: >60 mL/min (ref >60)
GFR calc non Af Amer: >60 mL/min (ref >60)
Glucose, Bld: 126 mg/dL — ABNORMAL HIGH (ref 65–99)
Potassium: 4.2 mmol/L (ref 3.5–5.1)
Sodium: 140 mmol/L (ref 135–145)

## 2016-11-18 LAB — I-STAT TROPONIN, ED
Troponin i, poc: 0 ng/mL (ref 0.00–0.08)
Troponin i, poc: 0 ng/mL (ref 0.00–0.08)

## 2016-11-18 NOTE — ED Triage Notes (Signed)
Patient comes in per [facility] with c/o cp substernal. Nonradiating. Ems gave 2 nitro, pain resolved. Denies cp now. No other s/s. Hx of MI, with no symptoms with those MIs. Patient a/ox4.

## 2016-11-18 NOTE — ED Notes (Signed)
Meal given per PA okay

## 2016-11-18 NOTE — Discharge Instructions (Signed)
Follow-up with your primary doctor along with your cardiologist.  Return here as needed

## 2016-11-18 NOTE — ED Provider Notes (Signed)
[facility] DEPT Provider Note   CSN: [id number] Arrival date & time: 11/18/16  1450     History   Chief Complaint Chief Complaint  Patient presents with  . Chest Pain    HPI Alyssa [name] is a 81 y.o. female.  HPI Patient presents to the emergency department with chest discomfortThat occurred earlier this morning.  The patient states it felt like a lightening bolt that lasted for about 3 seconds.  She states she did start sweating after this episode of pain.  She states that 10 minutes afterwards.  She did take a nitroglycerin, even though she was having no discomfort.  The patient states that nothing seems make the condition better or worse.  Patient states that this does not feel similar to previous episodes with her heart. The patient denies  shortness of breath, headache,blurred vision, neck pain, fever, cough, weakness, numbness, dizziness, anorexia, edema, abdominal pain, nausea, vomiting, diarrhea, rash, back pain, dysuria, hematemesis, bloody stool, near syncope, or syncope. Past Medical History:  Diagnosis Date  . Anemia   . CAD (coronary artery disease)    a. 2013 NSTEMI s/p DES to LAD. b. 04/03/14: NSTEMI s/p DES to OM1, DES to dRCA.  [name] Chronic diastolic CHF (congestive heart failure) ([place])    a. 2D ECHO 04/02/14 with hypokinesis in the basal inferior and inferoseptal walls. Focal and moderate concentric LVH. Overall LVEF 60-65%. G1DD. Elevated EDLV filling pressures. Mild TR.  [name] GERD (gastroesophageal reflux disease)   . History of GI bleed    a. 2013: adm for ABL anemia. 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  [name] HLD (hyperlipidemia)   . HTN (hypertension)   . Hypothyroid   . Peripheral vascular disease ([place])   . Refusal of blood transfusions as patient is Jehovah's Witness 09-29-12  . Type II diabetes mellitus [facility])     Patient Active Problem List   Diagnosis Date  Noted  . Chest pain 11/18/2016  . Hypertensive heart disease 04/11/2014  . Anemia   . History of GI bleed   . CAD (coronary artery disease)   . HLD (hyperlipidemia)   . Peripheral vascular disease ([place])   . Chronic diastolic CHF (congestive heart failure) ([place])   . Essential hypertension   . NSTEMI (non-ST elevated myocardial infarction) ([place]) 04/01/2014  . Refusal of blood transfusions as patient is Jehovah's Witness 09/29/2012  . Blood loss anemia 02/26/2012  . Melena 02/25/2012  . GERD (gastroesophageal reflux disease) 11/11/2011  . DM type 2 (diabetes mellitus, type 2) ([place]) 05/26/2011  . Hypothyroid     Past Surgical History:  Procedure Laterality Date  . CESAREAN SECTION  ~ 1961; 1966   plus 3 NVD  . COLONOSCOPY  02/27/2012   Procedure: COLONOSCOPY;  Surgeon: Lear Ng, MD;  Location: [facility] ENDOSCOPY;  Service: Endoscopy;  Laterality: N/A;  . CORONARY ANGIOPLASTY WITH STENT PLACEMENT  05/2011; 04/03/2014  . ESOPHAGOGASTRODUODENOSCOPY  02/26/2012   Procedure: ESOPHAGOGASTRODUODENOSCOPY (EGD);  Surgeon: Lear Ng, MD;  Location: [facility] ENDOSCOPY;  Service: Endoscopy;  Laterality: N/A;  . LEFT HEART CATHETERIZATION WITH CORONARY ANGIOGRAM N/A 06/12/2011   Procedure: LEFT HEART CATHETERIZATION WITH CORONARY ANGIOGRAM;  Surgeon: Josue Hector, MD;  Location: [facility] CATH LAB;  Service: Cardiovascular;  Laterality: N/A;  . LEFT HEART CATHETERIZATION WITH CORONARY ANGIOGRAM N/A 04/03/2014   Procedure: LEFT HEART CATHETERIZATION WITH CORONARY ANGIOGRAM;  Surgeon: Jettie Booze, MD;  Location: [facility]  CATH LAB;  Service: Cardiovascular;  Laterality: N/A;  . TONSILLECTOMY    . VAGINAL HYSTERECTOMY      OB History    No data available       Home Medications    Prior to Admission medications   Medication Sig Start Date End Date Taking? Authorizing Provider  aspirin 81 MG chewable tablet Take 81 mg by mouth daily.   Yes [provider]  azelastine (ASTELIN)  0.1 % nasal spray Place 2 sprays into both nostrils daily as needed for allergies. 07/23/16  Yes [provider]  carvedilol (COREG) 3.125 MG tablet Take 3.125 mg by mouth 2 (two) times daily with a meal.  07/04/12  Yes [provider]  Cholecalciferol ([employer] D3) 2000 units CAPS Take 1 capsule by mouth daily.   Yes [provider]  clopidogrel (PLAVIX) 75 MG tablet Take 1 tablet (75 mg total) by mouth daily. 10/09/16  Yes Turner, Eber Hong, MD  fluticasone (FLONASE) 50 MCG/ACT nasal spray Place 2 sprays into both nostrils daily.   Yes [provider]  furosemide (LASIX) 20 MG tablet Take 20 mg by mouth daily.    Yes [provider]  glimepiride (AMARYL) 4 MG tablet Take 4 mg by mouth daily with breakfast. Reported on 11/01/2015   Yes [provider]  hydrOXYzine (VISTARIL) 25 MG capsule 25 mg every 6 (six) hours as needed for itching.  02/18/16  Yes [provider]  ibuprofen (ADVIL,MOTRIN) 200 MG tablet Take 400 mg by mouth every 6 (six) hours as needed for mild pain.   Yes [provider]  insulin detemir (LEVEMIR) 100 UNIT/ML injection Inject 50 Units into the skin at bedtime.    Yes [provider]  levocetirizine (XYZAL) 5 MG tablet Take 5 mg by mouth at bedtime. 07/24/16  Yes [provider]  levothyroxine (SYNTHROID, LEVOTHROID) 125 MCG tablet Take 125 mcg by mouth daily. 07/05/15  Yes [provider]  loratadine (CLARITIN) 10 MG tablet Take 10 mg by mouth daily.   Yes [provider]  losartan (COZAAR) 50 MG tablet Take 1 tablet (50 mg total) by mouth daily. 04/11/14  Yes Dunn, Nedra Hai, PA-C  meclizine (ANTIVERT) 25 MG tablet Take 1 tablet (25 mg total) by mouth 3 (three) times daily as needed for dizziness. 10/22/16  Yes Julianne Rice, MD  nitroGLYCERIN (NITROSTAT) 0.4 MG SL tablet Place 0.4 mg under the tongue every 5 (five) minutes as needed for chest pain. Reported on 10/04/2015   Yes  [provider]  spironolactone (ALDACTONE) 50 MG tablet Take 25 mg by mouth daily.  05/06/16  Yes [provider]  triamcinolone cream (KENALOG) 0.1 % Apply 1 application topically daily as needed for itching. 11/16/16  Yes [provider]  vitamin B-12 (CYANOCOBALAMIN) 1000 MCG tablet Take 1,000 mcg by mouth daily.   Yes [provider]  ZETIA 10 MG tablet TAKE 1 TABLET BY MOUTH EVERY DAY 03/05/15  Yes Turner, Eber Hong, MD  amoxicillin-clavulanate (AUGMENTIN) 875-125 MG tablet Take 1 tablet by mouth 2 (two) times daily. One po bid x 7 days Patient not taking: Reported on 11/18/2016 10/22/16   Julianne Rice, MD  atorvastatin (LIPITOR) 80 MG tablet Take 1 tablet (80 mg total) by mouth daily at 6 PM. 04/04/14   Eileen Stanford, PA-C  Coenzyme Q10 (COQ10 PO) Take 1 capsule by mouth daily. Reported on 10/11/2015    [provider]  polyethylene glycol (MIRALAX / GLYCOLAX) packet Take  17 g by mouth daily as needed (constipation). Reported on 10/04/2015    [provider]    Family History Family History  Problem Relation Age of Onset  . Arthritis Father   . Heart disease Mother   . Varicose Veins Mother   . Hypertension Unknown     Social History Social History  Substance Use Topics  . Smoking status: Former Smoker    Packs/day: 1.50    Years: 16.00    Types: Cigarettes    Quit date: 03/15/1970  . Smokeless tobacco: Never Used  . Alcohol use 1.8 - 2.4 oz/week    3 - 4 Standard drinks or equivalent per week     Comment: 04/03/2014 "glass of wine or a beer 2-3 times/month"     Allergies   Patient has no known allergies.   Review of Systems Review of Systems All other systems negative except as documented in the HPI. All pertinent positives and negatives as reviewed in the HPI.  Physical Exam Updated Vital Signs BP (!) 126/92   Pulse 75   Temp 97.8 F (36.6 C) (Oral)   Resp (!) 22   Ht 5\' 1"  (1.549 m)   Wt 68 kg (150 lb)    SpO2 100%   BMI 28.34 kg/m   Physical Exam  Constitutional: She is oriented to person, place, and time. She appears well-developed and well-nourished. No distress.  HENT:  Head: Normocephalic and atraumatic.  Mouth/Throat: Oropharynx is clear and moist.  Eyes: Pupils are equal, round, and reactive to light.  Neck: Normal range of motion. Neck supple.  Cardiovascular: Normal rate, regular rhythm and normal heart sounds.  Exam reveals no gallop and no friction rub.   No murmur heard. Pulmonary/Chest: Effort normal and breath sounds normal. No respiratory distress. She has no wheezes.  Abdominal: Soft. Bowel sounds are normal. She exhibits no distension. There is no tenderness.  Neurological: She is alert and oriented to person, place, and time. She exhibits normal muscle tone. Coordination normal.  Skin: Skin is warm and dry. Capillary refill takes less than 2 seconds. No rash noted. No erythema.  Psychiatric: She has a normal mood and affect. Her behavior is normal.  Nursing note and vitals reviewed.    ED Treatments / Results  Labs (all labs ordered are listed, but only abnormal results are displayed) Labs Reviewed  BASIC METABOLIC PANEL - Abnormal; Notable for the following:       Result Value   Glucose, Bld 126 (*)    All other components within normal limits  CBC  I-STAT TROPONIN, ED  I-STAT TROPONIN, ED    EKG  EKG Interpretation None       Radiology Dg Chest 2 View  Result Date: 11/18/2016 CLINICAL DATA:  Chest pain starting earlier today on the right. EXAM: CHEST  2 VIEW COMPARISON:  04/01/2014 FINDINGS: Heart is borderline enlarged with minimal aortic atherosclerosis. Lungs are hyperinflated without pneumonic consolidation, effusion or pneumothorax. Mild chronic interstitial prominence is again noted bilaterally which may reflect age related interstitial change chronic stable degenerative changes are along the dorsal spine with osteopenia. IMPRESSION: Borderline  cardiomegaly with aortic atherosclerosis. No acute pulmonary disease. Electronically Signed   By: Ashley Royalty M.D.   On: 11/18/2016 18:14    Procedures Procedures (including critical care time)  Medications Ordered in ED Medications - No data to display   Initial Impression / Assessment and Plan / ED Course  I have reviewed the triage vital signs and the  nursing notes.  Pertinent labs & imaging results that were available during my care of the patient were reviewed by me and considered in my medical decision making (see chart for details).     Patient states that she does not want to be admitted to the hospital, even now.  We had suggested doing so due to the fact she did have some mild discomfort here in the emergency department that was short-lived, along with the episode at home.  Patient states that she will follow-up with her cardiologist.  Told to return here as needed  Final Clinical Impressions(s) / ED Diagnoses   Final diagnoses:  Atypical chest pain    New Prescriptions New Prescriptions   No medications on file     Rebeca Allegra 11/20/16 [id number]    Quintella Reichert, MD 11/21/16 [id number]

## 2016-11-19 ENCOUNTER — Encounter: Payer: PPO | Admitting: Family Medicine

## 2016-11-19 ENCOUNTER — Encounter: Payer: Self-pay | Admitting: Family Medicine

## 2016-11-19 DIAGNOSIS — IMO0001 Reserved for inherently not codable concepts without codable children: Secondary | ICD-10-CM | POA: Insufficient documentation

## 2016-11-19 DIAGNOSIS — E119 Type 2 diabetes mellitus without complications: Principal | ICD-10-CM

## 2016-11-19 DIAGNOSIS — Z794 Long term (current) use of insulin: Principal | ICD-10-CM

## 2016-11-19 NOTE — Progress Notes (Incomplete)
   New patient office visit note:  Impression and Recommendations:    No diagnosis found.   No problem-specific Assessment & Plan notes found for this encounter.   The patient was counseled, risk factors were discussed, anticipatory guidance given.   New Prescriptions   No medications on file    No orders of the defined types were placed in this encounter.   Discontinued Medications   AMOXICILLIN-CLAVULANATE (AUGMENTIN) 875-125 MG TABLET    Take 1 tablet by mouth 2 (two) times daily. One po bid x 7 days   GLIMEPIRIDE (AMARYL) 4 MG TABLET    Take 4 mg by mouth daily with breakfast. Reported on 11/01/2015    Modified Medications   No medications on file    No orders of the defined types were placed in this encounter.    Gross side effects, risk and benefits, and alternatives of medications discussed with patient.  Patient is aware that all medications have potential side effects and we are unable to predict every side effect or drug-drug interaction that may occur.  Expresses verbal understanding and consents to current therapy plan and treatment regimen.  No Follow-up on file.  Please see AVS handed out to patient at the end of our visit for further patient instructions/ counseling done pertaining to today's office visit.    Note: This document was prepared using Dragon voice recognition software and may include unintentional dictation errors.  ----------------------------------------------------------------------------------------------------------------------    Subjective:    Chief complaint:   Chief Complaint  Patient presents with  . Establish Care     HPI: Alyssa [name] is a pleasant 80 y.o. female who presents to [facility] today to review their medical history with me and establish care.   I asked the patient to review their chronic problem list with me to ensure everything was updated and accurate.    All recent  office visits with other providers, any medical records that patient brought in etc  - I reviewed today.     Also asked pt to get me medical records from ALL providers/ specialists that they had seen within the past 3-5 years- if they are in private practice and/or do not work for a [facility], [facility], [facility], [facility] or [facility] owned practice.  Told them to call their specialists to clarify this if they are not sure.   -->    Patient a new patient office visit which started at 145 today.  Patient did not have any of her new pt paperwork filled out at all including medical history, medications etc even at 2:30pm .  No problems updated.    Wt Readings from Last 3 Encounters:  11/19/16 159 lb (72.1 kg)  11/18/16 150 lb (68 kg)  10/22/16 150 lb (68 kg)   BP Readings from Last 3 Encounters:  11/19/16 130/75  11/18/16 (!) 141/71  10/22/16 128/61   Pulse Readings from Last 3 Encounters:  11/19/16 78  11/18/16 85  10/22/16 78   BMI Readings from Last 3 Encounters:  11/19/16 29.08 kg/m  11/18/16 28.34 kg/m  10/22/16 28.34 kg/m    Patient Care Team    Relationship Specialty Notifications Start End  Lam, Lynn E, NP PCP - General Nurse Practitioner  02/21/16   Tilles, Steven J., DPM  Podiatry  01/26/15   Glover, Kimberly A, RN [facility] Care Management  Admissions 08/07/15     Patient Active Problem List   Diagnosis Date   Noted  . Chest pain 11/18/2016  . Diabetes mellitus without complication ([place]) [id number]/49/6759  . Dysthymia 06/20/2014  . Hypertensive heart disease 04/11/2014  . Anemia   . History of GI bleed   . CAD (coronary artery disease)   . HLD (hyperlipidemia)   . Peripheral vascular disease ([place])   . Chronic diastolic CHF (congestive heart failure) ([place])   . Essential hypertension   . NSTEMI (non-ST elevated myocardial infarction) ([place]) 04/01/2014  . Refusal of blood transfusions as patient is Jehovah's Witness 09/29/2012  . Blood loss anemia 02/26/2012    . Melena 02/25/2012  . GERD (gastroesophageal reflux disease) 11/11/2011  . DM type 2 (diabetes mellitus, type 2) ([place]) 05/26/2011  . Hypothyroid      Past Medical History:  Diagnosis Date  . Anemia   . CAD (coronary artery disease)    a. 2013 NSTEMI s/p DES to LAD. b. 04/03/14: NSTEMI s/p DES to OM1, DES to dRCA.  [name] Chronic diastolic CHF (congestive heart failure) ([place])    a. 2D ECHO 04/02/14 with hypokinesis in the basal inferior and inferoseptal walls. Focal and moderate concentric LVH. Overall LVEF 60-65%. G1DD. Elevated EDLV filling pressures. Mild TR.  [name] GERD (gastroesophageal reflux disease)   . History of GI bleed    a. 2013: adm for ABL anemia. 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  [name] HLD (hyperlipidemia)   . HTN (hypertension)   . Hypothyroid   . Peripheral vascular disease ([place])   . Refusal of blood transfusions as patient is Jehovah's Witness 09-29-12  . Type II diabetes mellitus ([place])      Past Medical History:  Diagnosis Date  . Anemia   . CAD (coronary artery disease)    a. 2013 NSTEMI s/p DES to LAD. b. 04/03/14: NSTEMI s/p DES to OM1, DES to dRCA.  [name] Chronic diastolic CHF (congestive heart failure) ([place])    a. 2D ECHO 04/02/14 with hypokinesis in the basal inferior and inferoseptal walls. Focal and moderate concentric LVH. Overall LVEF 60-65%. G1DD. Elevated EDLV filling pressures. Mild TR.  [name] GERD (gastroesophageal reflux disease)   . History of GI bleed    a. 2013: adm for ABL anemia. 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  [name] HLD (hyperlipidemia)   . HTN (hypertension)   . Hypothyroid   . Peripheral vascular disease ([place])   . Refusal of blood transfusions as patient is Jehovah's Witness 09-29-12  . Type II diabetes mellitus ([place])      Past Surgical  History:  Procedure Laterality Date  . CESAREAN SECTION  ~ 1961; 1966   plus 3 NVD  . COLONOSCOPY  02/27/2012   Procedure: COLONOSCOPY;  Surgeon: Lear Ng, MD;  Location: [facility] ENDOSCOPY;  Service: Endoscopy;  Laterality: N/A;  . CORONARY ANGIOPLASTY WITH STENT PLACEMENT  05/2011; 04/03/2014  . ESOPHAGOGASTRODUODENOSCOPY  02/26/2012   Procedure: ESOPHAGOGASTRODUODENOSCOPY (EGD);  Surgeon: Lear Ng, MD;  [facility];  Service: Endoscopy;  Laterality: N/A;  . LEFT HEART CATHETERIZATION WITH CORONARY ANGIOGRAM N/A 06/12/2011   Procedure: LEFT HEART CATHETERIZATION WITH CORONARY ANGIOGRAM;  Surgeon: Josue Hector, MD;  Location: [facility] CATH LAB;  Service: Cardiovascular;  Laterality: N/A;  . LEFT HEART CATHETERIZATION WITH CORONARY ANGIOGRAM N/A 04/03/2014   Procedure: LEFT HEART CATHETERIZATION WITH CORONARY ANGIOGRAM;  Surgeon: Jettie Booze, MD;  Location[facility]  C[facility] LAB;  Service: Cardiovascular;  Laterality: N/A;  . TONSILLECTOMY    . VAGINAL HYSTERECTOMY       Family History  Problem Relation Age of Onset  . Arthritis Father   . Heart disease Mother   . Varicose Veins Mother   . Hypertension Unknown      History  Drug Use No     History  Alcohol Use  . 1.8 - 2.4 oz/week  . 3 - 4 Standard drinks or equivalent per week    Comment: 04/03/2014 "glass of wine or a beer 2-3 times/month"     History  Smoking Status  . Former Smoker  . Packs/day: 1.50  . Years: 16.00  . Types: Cigarettes  . Quit date: 03/15/1970  Smokeless Tobacco  . Never Used     Outpatient Encounter Prescriptions as of 11/19/2016  Medication Sig Note  . aspirin 81 MG chewable tablet Take 81 mg by mouth daily.   . atorvastatin (LIPITOR) 80 MG tablet Take 1 tablet (80 mg total) by mouth daily at 6 PM. 11/18/2016: Patient and her husbands medication is mixed up in the pill box patient may have been taking husbands medication he had a 40 mg prescription and 80 mg prescription in  the patient's box  . azelastine (ASTELIN) 0.1 % nasal spray Place 2 sprays into both nostrils daily as needed for allergies.   . carvedilol (COREG) 3.125 MG tablet Take 3.125 mg by mouth 2 (two) times daily with a meal.  04/02/2014: .  . Cholecalciferol ([employer] D3) 2000 units CAPS Take 1 capsule by mouth daily.   . clopidogrel (PLAVIX) 75 MG tablet Take 1 tablet (75 mg total) by mouth daily.   . Coenzyme Q10 (COQ10 PO) Take 1 capsule by mouth daily. Reported on 10/11/2015   . fluticasone (FLONASE) 50 MCG/ACT nasal spray Place 2 sprays into both nostrils daily.   . furosemide (LASIX) 20 MG tablet Take 20 mg by mouth daily.  04/02/2014: .  . hydrOXYzine (VISTARIL) 25 MG capsule 25 mg every 6 (six) hours as needed for itching.    . ibuprofen (ADVIL,MOTRIN) 200 MG tablet Take 400 mg by mouth every 6 (six) hours as needed for mild pain.   . insulin detemir (LEVEMIR) 100 UNIT/ML injection Inject 50 Units into the skin at bedtime.    . levocetirizine (XYZAL) 5 MG tablet Take 5 mg by mouth at bedtime.   . levothyroxine (SYNTHROID, LEVOTHROID) 125 MCG tablet Take 125 mcg by mouth daily.   . loratadine (CLARITIN) 10 MG tablet Take 10 mg by mouth daily.   . losartan (COZAAR) 50 MG tablet Take 1 tablet (50 mg total) by mouth daily.   . meclizine (ANTIVERT) 25 MG tablet Take 1 tablet (25 mg total) by mouth 3 (three) times daily as needed for dizziness.   . nitroGLYCERIN (NITROSTAT) 0.4 MG SL tablet Place 0.4 mg under the tongue every 5 (five) minutes as needed for chest pain. Reported on 10/04/2015 04/02/2014: .  . polyethylene glycol (MIRALAX / GLYCOLAX) packet Take 17 g by mouth daily as needed (constipation). Reported on 10/04/2015   . spironolactone (ALDACTONE) 50 MG tablet Take 25 mg by mouth daily.    . triamcinolone cream (KENALOG) 0.1 % Apply 1 application topically daily as needed for itching.   . vitamin B-12 (CYANOCOBALAMIN) 1000 MCG tablet Take 1,000 mcg by mouth daily.   . ZETIA 10 MG tablet TAKE 1  TABLET BY MOUTH EVERY DAY   . [DISCONTINUED] amoxicillin-clavulanate (AUGMENTIN)   875-125 MG tablet Take 1 tablet by mouth 2 (two) times daily. One po bid x 7 days (Patient not taking: Reported on 11/18/2016)   . [DISCONTINUED] glimepiride (AMARYL) 4 MG tablet Take 4 mg by mouth daily with breakfast. Reported on 11/01/2015 12/11/2015: Not taking   No facility-administered encounter medications on file as of 11/19/2016.     Allergies: Patient has no known allergies.   ROS   Objective:   Blood pressure 130/75, pulse 78, height 5' 2" (1.575 m), weight 159 lb (72.1 kg). Body mass index is 29.08 kg/m. General: Well Developed, well nourished, and in no acute distress.  Neuro: Alert and oriented x3, extra-ocular muscles intact, sensation grossly intact.  HEENT:[place]/AT, PERRLA, neck supple, No carotid bruits Skin: no gross rashes  Cardiac: Regular rate and rhythm Respiratory: Essentially clear to auscultation bilaterally. Not using accessory muscles, speaking in full sentences.  Abdominal: not grossly distended Musculoskeletal: Ambulates w/o diff, FROM * 4 ext.  Vasc: less 2 sec cap RF, warm and pink  Psych:  No HI/SI, judgement and insight good, Euthymic mood. Full Affect.    Recent Results (from the past 2160 hour(s))  Basic metabolic panel     Status: Abnormal   Collection Time: 10/22/16  2:47 PM  Result Value Ref Range   Sodium 130 (L) 135 - 145 mmol/L   Potassium 4.0 3.5 - 5.1 mmol/L   Chloride 100 (L) 101 - 111 mmol/L   CO2 22 22 - 32 mmol/L   Glucose, Bld 268 (H) 65 - 99 mg/dL   BUN 13 6 - 20 mg/dL   Creatinine, Ser 0.66 0.44 - 1.00 mg/dL   Calcium 9.0 8.9 - 10.3 mg/dL   GFR calc non Af Amer >60 >60 mL/min   GFR calc Af Amer >60 >60 mL/min    Comment: (NOTE) The eGFR has been calculated using the CKD EPI equation. This calculation has not been validated in all clinical situations. eGFR's persistently <60 mL/min signify possible Chronic Kidney Disease.    Anion gap 8 5 - 15    CBC     Status: None   Collection Time: 10/22/16  2:47 PM  Result Value Ref Range   WBC 9.0 4.0 - 10.5 K/uL   RBC 4.86 3.87 - 5.11 MIL/uL   Hemoglobin 13.1 12.0 - 15.0 g/dL   HCT 40.6 36.0 - 46.0 %   MCV 83.5 78.0 - 100.0 fL   MCH 27.0 26.0 - 34.0 pg   MCHC 32.3 30.0 - 36.0 g/dL   RDW 14.8 11.5 - 15.5 %   Platelets 252 150 - 400 K/uL  Hepatic function panel     Status: Abnormal   Collection Time: 10/22/16  6:29 PM  Result Value Ref Range   Total Protein 6.5 6.5 - 8.1 g/dL   Albumin 3.7 3.5 - 5.0 g/dL   AST 28 15 - 41 U/L   ALT 36 14 - 54 U/L   Alkaline Phosphatase 83 38 - 126 U/L   Total Bilirubin 0.6 0.3 - 1.2 mg/dL   Bilirubin, Direct <0.1 (L) 0.1 - 0.5 mg/dL   Indirect Bilirubin NOT CALCULATED 0.3 - 0.9 mg/dL  Brain natriuretic peptide     Status: None   Collection Time: 10/22/16  6:29 PM  Result Value Ref Range   B Natriuretic Peptide 36.8 0.0 - 100.0 pg/mL  Magnesium     Status: None   Collection Time: 10/22/16  6:29 PM  Result Value Ref Range   Magnesium 1.9 1.7 -   2.4 mg/dL  I-stat troponin, ED     Status: None   Collection Time: 10/22/16  6:32 PM  Result Value Ref Range   Troponin i, poc 0.00 0.00 - 0.08 ng/mL   Comment 3            Comment: Due to the release kinetics of cTnI, a negative result within the first hours of the onset of symptoms does not rule out myocardial infarction with certainty. If myocardial infarction is still suspected, repeat the test at appropriate intervals.   Urinalysis, Routine w reflex microscopic     Status: Abnormal   Collection Time: 10/22/16  8:11 PM  Result Value Ref Range   Color, Urine STRAW (A) YELLOW   APPearance CLEAR CLEAR   Specific Gravity, Urine 1.005 1.005 - 1.030   pH 6.0 5.0 - 8.0   Glucose, UA >=500 (A) NEGATIVE mg/dL   Hgb urine dipstick NEGATIVE NEGATIVE   Bilirubin Urine NEGATIVE NEGATIVE   Ketones, ur NEGATIVE NEGATIVE mg/dL   Protein, ur NEGATIVE NEGATIVE mg/dL   Nitrite NEGATIVE NEGATIVE   Leukocytes,  UA MODERATE (A) NEGATIVE   RBC / HPF 0-5 0 - 5 RBC/hpf   WBC, UA 6-30 0 - 5 WBC/hpf   Bacteria, UA NONE SEEN NONE SEEN   Squamous Epithelial / LPF 0-5 (A) NONE SEEN  Urine culture     Status: Abnormal   Collection Time: 10/22/16  8:11 PM  Result Value Ref Range   Specimen Description URINE, RANDOM    Special Requests NONE    Culture MULTIPLE ORGANISMS PRESENT, NONE PREDOMINANT (A)    Report Status 10/24/2016 FINAL   Basic metabolic panel     Status: Abnormal   Collection Time: 11/18/16  3:27 PM  Result Value Ref Range   Sodium 140 135 - 145 mmol/L   Potassium 4.2 3.5 - 5.1 mmol/L   Chloride 106 101 - 111 mmol/L   CO2 25 22 - 32 mmol/L   Glucose, Bld 126 (H) 65 - 99 mg/dL   BUN 12 6 - 20 mg/dL   Creatinine, Ser 0.72 0.44 - 1.00 mg/dL   Calcium 9.3 8.9 - 10.3 mg/dL   GFR calc non Af Amer >60 >60 mL/min   GFR calc Af Amer >60 >60 mL/min    Comment: (NOTE) The eGFR has been calculated using the CKD EPI equation. This calculation has not been validated in all clinical situations. eGFR's persistently <60 mL/min signify possible Chronic Kidney Disease.    Anion gap 9 5 - 15  CBC     Status: None   Collection Time: 11/18/16  3:27 PM  Result Value Ref Range   WBC 8.6 4.0 - 10.5 K/uL   RBC 5.05 3.87 - 5.11 MIL/uL   Hemoglobin 13.5 12.0 - 15.0 g/dL   HCT 41.8 36.0 - 46.0 %   MCV 82.8 78.0 - 100.0 fL   MCH 26.7 26.0 - 34.0 pg   MCHC 32.3 30.0 - 36.0 g/dL   RDW 14.3 11.5 - 15.5 %   Platelets 224 150 - 400 K/uL  I-stat troponin, ED     Status: None   Collection Time: 11/18/16  3:38 PM  Result Value Ref Range   Troponin i, poc 0.00 0.00 - 0.08 ng/mL   Comment 3            Comment: Due to the release kinetics of cTnI, a negative result within the first hours of the onset of symptoms does not rule out myocardial infarction  with certainty. If myocardial infarction is still suspected, repeat the test at appropriate intervals.   I-stat troponin, ED     Status: None   Collection  Time: 11/18/16  7:08 PM  Result Value Ref Range   Troponin i, poc 0.00 0.00 - 0.08 ng/mL   Comment 3            Comment: Due to the release kinetics of cTnI, a negative result within the first hours of the onset of symptoms does not rule out myocardial infarction with certainty. If myocardial infarction is still suspected, repeat the test at appropriate intervals.

## 2016-11-25 ENCOUNTER — Encounter: Payer: Self-pay | Admitting: *Deleted

## 2016-11-25 ENCOUNTER — Other Ambulatory Visit: Payer: Self-pay | Admitting: *Deleted
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
## 2016-11-26 ENCOUNTER — Ambulatory Visit (INDEPENDENT_AMBULATORY_CARE_PROVIDER_SITE_OTHER): Payer: PPO | Admitting: Licensed Clinical Social Worker

## 2016-11-26 DIAGNOSIS — F321 Major depressive disorder, single episode, moderate: Secondary | ICD-10-CM

## 2016-12-01 ENCOUNTER — Ambulatory Visit: Payer: Self-pay | Admitting: *Deleted

## 2016-12-02 ENCOUNTER — Other Ambulatory Visit: Payer: Self-pay | Admitting: *Deleted
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
## 2016-12-04 ENCOUNTER — Other Ambulatory Visit: Payer: Self-pay | Admitting: *Deleted

## 2016-12-04 NOTE — Patient Outreach (Signed)
[facility]) Care Management  12/04/2016  Alyssa [name] 30-Apr-1935 [id number]   Follow up telephone call  Spoke with patient ,  Reports she has  spoken with her daughter following by recent  telephone call to her . Patient states she has decided to continue seeing PCP in [place] at this time . Discussed scheduling follow up appointment regarding Diabetes follow up patient has agreed to , offered to assist with arranging visit at this time she declined. Patient agreeable to follow up home visit for Diabetes education  Patient discussed she has follow up visit with MD regarding to recent sinus infection and vertigo, she has appointment written down unable to recall date.   Plan Will follow up home visit in the next 2 weeks. Patient will attend all upcoming MD appointments. Reinforced to continue to work toward monitoring blood sugar and taking insulin as prescribed.    [name], RN, [place] Management Coordinator  [phone]- Mobile [phone]- Toll Free Main Office

## 2016-12-05 ENCOUNTER — Ambulatory Visit: Payer: Self-pay | Admitting: *Deleted

## 2016-12-09 ENCOUNTER — Other Ambulatory Visit: Payer: Self-pay | Admitting: Pharmacist

## 2016-12-09 NOTE — Patient Outreach (Signed)
[facility]) Care Management  Alyssa [name]   12/09/2016  Alyssa [name] 1935/11/15 [id number]  81 year old female referred to [place] Management. [place] services requested for medication adherence and medication reconciliation.  PMHx includes, but not limited to, CAD, type 2 diabetes, GERD, hypothyroidism, HTN, HLD, PVD, and CHF (EF= 60-65% 12/'15).  Noted recent ED visit on 11/18/16 for chest pain during which patient refused admission and ED visit on 10/22/16 for dizziness treated with meclizine and antibiotics for possible UTI.    Successful telephone encounter with patient today. HIPAA identifiers verified. Patient states she is still waking up and would prefer to discuss her medications at another time.  She is agreeable to a home visit next week.   Plan: Home visit next Thursday at 2:00 PM to review medications.   Ralene Bathe, PharmD, [place] [phone]

## 2016-12-18 ENCOUNTER — Other Ambulatory Visit: Payer: Self-pay | Admitting: *Deleted

## 2016-12-18 ENCOUNTER — Other Ambulatory Visit: Payer: Self-pay | Admitting: Pharmacist

## 2016-12-18 NOTE — Patient Outreach (Addendum)
[facility]) Care Management   12/18/2016  Alyssa [name] [birth year] [id number]  Alyssa [name] is an 81 y.o. female  Subjective:  Patient reports she has been taking her insulin . Patient reports she needs helps with getting an appointment to see podiatrist, discussed concern about area on top of left great toe nail area   reports she called office of [facility] to try and get an appointment but someone else needs to call.  Patient discussed her recent problems related to sinus and allergy problems and that she has not had appointment with the ENT doctor. Patient reports she has upcoming Dentist appointment.    Patient again discussed she plans to continue care Gardiner Rhyme, NP at [facility] instead of changing to new PCP.     Objective:  BP 112/60 (BP Location: Left Arm, Patient Position: Sitting, Cuff Size: Normal)   Pulse 78   Resp 18   SpO2 97%    Review of Systems  Constitutional: Negative.   HENT: Negative.   Eyes: Negative.   Respiratory: Negative.   Gastrointestinal: Negative.   Genitourinary: Negative.   Musculoskeletal: Negative.   Skin: Negative.   Neurological: Negative.   Endo/Heme/Allergies: Negative.   Psychiatric/Behavioral: Positive for depression. Negative for suicidal ideas.       Forgetful    Physical Exam  Constitutional: She is oriented to person, place, and time. She appears well-developed and well-nourished.  Cardiovascular: Normal rate.   Respiratory: Effort normal.  GI: Soft.  Neurological: She is alert and oriented to person, place, and time.  Skin: Skin is warm and dry.       Encounter Medications:   Outpatient Encounter Prescriptions as of 12/18/2016  Medication Sig Note  . aspirin 81 MG chewable tablet Take 81 mg by mouth daily.   [name] atorvastatin (LIPITOR) 80 MG tablet Take 1 tablet (80 mg total) by mouth daily at 6 PM.   . azelastine (ASTELIN) 0.1 % nasal spray Place 2 sprays into both nostrils daily as  needed for allergies.   . carvedilol (COREG) 3.125 MG tablet Take 3.125 mg by mouth 2 (two) times daily with a meal.  04/02/2014: .  [name] Cholecalciferol ([facility] D3) 2000 units CAPS Take 1 capsule by mouth daily.   . clopidogrel (PLAVIX) 75 MG tablet Take 1 tablet (75 mg total) by mouth daily.   . Coenzyme Q10 (COQ10 PO) Take 1 capsule by mouth daily. Reported on 10/11/2015   . fluticasone (FLONASE) 50 MCG/ACT nasal spray Place 2 sprays into both nostrils daily.   . furosemide (LASIX) 20 MG tablet Take 20 mg by mouth daily.  04/02/2014: .  . hydrOXYzine (VISTARIL) 25 MG capsule 25 mg every 6 (six) hours as needed for itching.    [name] ibuprofen (ADVIL,MOTRIN) 200 MG tablet Take 400 mg by mouth every 6 (six) hours as needed for mild pain.   [name] insulin detemir (LEVEMIR) 100 UNIT/ML injection Inject 50 Units into the skin at bedtime.    [name] levocetirizine (XYZAL) 5 MG tablet Take 5 mg by mouth at bedtime.   [name] levothyroxine (SYNTHROID, LEVOTHROID) 125 MCG tablet Take 125 mcg by mouth daily.   [name] loratadine (CLARITIN) 10 MG tablet Take 10 mg by mouth daily.   [name] losartan (COZAAR) 50 MG tablet Take 1 tablet (50 mg total) by mouth daily.   . meclizine (ANTIVERT) 25 MG tablet Take 1 tablet (25 mg total) by mouth 3 (three) times daily as needed for dizziness.   [name]  nitroGLYCERIN (NITROSTAT) 0.4 MG SL tablet Place 0.4 mg under the tongue every 5 (five) minutes as needed for chest pain. Reported on 10/04/2015 04/02/2014: .  . polyethylene glycol (MIRALAX / GLYCOLAX) packet Take 17 g by mouth daily as needed (constipation). Reported on 10/04/2015   . spironolactone (ALDACTONE) 50 MG tablet Take 25 mg by mouth daily.  11/26/2016: Currently out, Called pharmacy for refill  . triamcinolone cream (KENALOG) 0.1 % Apply 1 application topically daily as needed for itching.   . vitamin B-12 (CYANOCOBALAMIN) 1000 MCG tablet Take 1,000 mcg by mouth daily.   [name] ZETIA 10 MG tablet TAKE 1 TABLET BY MOUTH EVERY DAY    No  facility-administered encounter medications on file as of 12/18/2016.     Functional Status:   In your present state of health, do you have any difficulty performing the following activities: 10/02/2016 06/25/2016  Hearing? N N  Vision? N N  Difficulty concentrating or making decisions? Alyssa [name]  Walking or climbing stairs? Alyssa [name]  Comment walks best on level surface, avoids multiple steps , and uses scooters at larger stores  -  Dressing or bathing? N N  Doing errands, shopping? Y Y  Comment husband does the driving  -  Conservation officer, nature and eating ? N N  Using the Toilet? N N  In the past six months, have you accidently leaked urine? N N  Do you have problems with loss of bowel control? N N  Managing your Medications? Alyssa [name]  Comment forgets to take at times -  Managing your Finances? N N  Housekeeping or managing your Housekeeping? Y Y  Some recent data might be hidden    Fall/Depression Screening:    Fall Risk  11/25/2016 11/19/2016 10/24/2016  Falls in the past year? Yes Yes Yes  Number falls in past yr: 1 1 2  or more  Injury with Fall? No No No  Risk Factor Category  High Fall Risk - High Fall Risk  Risk for fall due to : History of fall(s) - History of fall(s)  Risk for fall due to: Comment - - -  Follow up Falls prevention discussed;Falls evaluation completed Falls evaluation completed Falls prevention discussed   PHQ 2/9 Scores 11/25/2016 11/19/2016 08/19/2016 06/25/2016 05/08/2016 04/15/2016 10/01/2015  PHQ - 2 Score 2 0 2 6 2 2 6   PHQ- 9 Score - - 5 12 6 7 14   Exception Documentation - - - - - - -    Assessment:  Co Visit with Alyssa [name] me, Pharmacist and Alyssa [name], Alyssa [name].   1.Diabetes- reports taking insulin daily, glucose meter reviewed checked 4 times in last 30 days. Today's reading during visit 198. Need refill on strips and lancets. Last Alc 09/11/16- 11.3 2.Left Toe nail area- Need assist with following up on podiatry visit, has had partial nail removal in past , has raised pink  intact area to nail bed area .  3.Recent Sinus problems- has not had recommended follow up with ENT after ED visit, prefers to wait due to other upcoming medical appointments  4.Annual Wellness- Has upcoming Dental visit, received Flu shot 04/21/16. Due for wellness follow up at PCP office.    Plan:  1.Reinforced continuing insulin as prescribed. [facility] pharmacist [place]in [place] has spoken with [place] regarding refill on strips and lancets. EMMI handouts on controlling diabetes, hyperglycemia. 2.Placed call to [place] center representative, states they have not had referral. Placed call to [facility] spoke with Dorian Pod , she will place  note on patient record to follow up on referral.  3.Will follow up with patient regarding making appointment in the next 2 weeks. 4.Encouraged to keep all upcoming visit. Called PCP office to schedule wellness visit and discussed when next A1c check is due, office visit schedule 9/14 at 2 pm, note on patient refrigerator.   Care planning and patient goal progression reviewed at visit.  Will plan telephone follow up in the next week.  Will send PCP visit note.  THN CM Care Plan Problem One     Most Recent Value  Care Plan Problem One  Knowledge deficit related to Diabetes self care management   Role Documenting the Problem One  Care Management [place] for Problem One  Active  THN Long Term Goal   Patient will report decrease in A1c  in the next 60 days   THN Long Term Goal Start Date  11/25/16  Interventions for Problem One Long Term Goal  Reinforced taking medication as prescribed,MD office regarding schedule of next [place], keeping all MD appointments   [facility] CM Short Term Goal #1   Patient will report attending appointment with counselor in the next 30 days   THN CM Short Term Goal #1 Start Date  11/25/16  [facility] CM Short Term Goal #1 Met Date  12/02/16  [facility] CM Short Term Goal #2   Patient will report checking blood sugar at least 3 days a week in  the in the next 30 days   THN CM Short Term Goal #2 Start Date  11/25/16  Interventions for Short Term Goal #2  Provided EMMI handout on controlling Diabetes, Reviewed.Discussed again checking blood sugar to help determine current progress toward of lowering blood sugar   THN CM Short Term Goal #3  Patient will report no falls in the next 30 days   THN CM Short Term Goal #3 Start Date  11/25/16  Interventions for Short Tern Goal #3  Reviewed  regarding fall precautions,rising slowly from sitting position, and standing a few seconds before starting to walk,  using walker as needed, notifying MD of reoccurence of dizziness.   THN CM Short Term Goal #4  Patient will reports keeping all Medical appointments in the next 30 days   THN CM Short Term Goal #4 Start Date  12/04/16  Interventions for Short Term Goal #4  Called PCP office to schedule wellness visit and check A1c      [name], RN, [place] Management Coordinator  [phone]- Mobile [phone]- [place] Office

## 2016-12-18 NOTE — Patient Outreach (Signed)
[name] & Alyssa [name] [facility]) Care Management  [place]   12/18/2016  Alyssa [name] [birth year] [id number]  81 year old female referred to [place] Management. [place] services requested for medication adherence and medication reconciliation.  PMHx includes, but not limited to, CAD, type 2 diabetes, GERD, hypothyroidism, HTN, HLD, PVD, and CHF (EF= 60-65% 12/'15).  Noted recent ED visit on 11/18/16 for chest pain during which patient refused admission and ED visit on 10/22/16 for dizziness treated with meclizine and antibiotics for possible UTI.    Subjective: Successful home visit with [name], her husband Jenny Reichmann, [facility] RN [name], and [place] Reymundo Poll.    Patient reports she used to have her prescriptions filled at [facility] but recently moved some [facility] [place] due to dissatisfaction with [facility] not always having her medication in stock.    Patient reports she was recently in the hospital for a fall after an episode of vertigo.  She also complains of left face and ear pain and states she has sinusitis.  She reports she has had ear problems since childhood.    Patient has a Tupperware container on her kitchen table filled with her medication bottles which also has two of her husband's pill bottles mixed in.    Objective:  CBG during visit: 198  Medications Reviewed Today    Reviewed by Rudean Haskell, [place] (Pharmacist) on 12/18/16 at [place] List Status: <None>  Medication Order Taking? Sig Documenting Provider Last Dose Status Informant  aspirin 81 MG chewable tablet [id number] Yes Take 81 mg by mouth daily. [provider] Taking Active Self  atorvastatin (LIPITOR) 80 MG tablet [id number] No Take 1 tablet (80 mg total) by mouth daily at 6 PM.  Patient not taking:  Reported on 12/18/2016   Eileen Stanford, PA-C Not Taking Active Self           Med Note Bertell Maria, [facility] A   Wed Nov 26, 2016  9:54 AM)     azelastine (ASTELIN) 0.1 % nasal spray [id number] Yes Place 2 sprays into both nostrils daily as needed for allergies. [provider] Taking Active Self  carvedilol (COREG) 3.125 MG tablet [id number] No Take 3.125 mg by mouth 2 (two) times daily with a meal.  [provider] Not Taking Active Self           Med Note Augustin Coupe, [facility]   Sun Apr 02, 2014  2:30 PM) .  Cholecalciferol ([facility] D3) 2000 units CAPS [id number] No Take 1 capsule by mouth daily. [provider] Not Taking Active Self  clopidogrel (PLAVIX) 75 MG tablet [id number] No Take 1 tablet (75 mg total) by mouth daily.  Patient not taking:  Reported on 12/18/2016   Sueanne Margarita, MD Not Taking Active Self  Coenzyme Q10 (COQ10 PO) [id number] Yes Take 1 capsule by mouth daily. Reported on 10/11/2015 [provider] Taking Active Self           Med Note Larwance Sachs A   Tue Oct 09, 2015  3:03 PM)    fluticasone (FLONASE) 50 MCG/ACT nasal spray [id number] No Place 2 sprays into both nostrils daily. [provider] Not Taking Active Self  furosemide (LASIX) 20 MG tablet [id number] No Take 20 mg by mouth daily.  [provider] Not Taking Active Self           Med Note Augustin Coupe, [facility]   Sun Apr 02, 2014  2:30 PM) .  hydrOXYzine (VISTARIL) 25 MG capsule [id number] Yes 25 mg every 6 (six) hours as needed for itching.  [provider] Taking Active Self  ibuprofen (ADVIL,MOTRIN) 200 MG tablet [id number] No Take 400 mg by mouth every 6 (six) hours as needed for mild pain. [provider] Not Taking Active Self  insulin detemir (LEVEMIR) 100 UNIT/ML injection [id number] Yes Inject 50 Units into the skin at bedtime.  [provider] Taking Active Self  levocetirizine (XYZAL) 5 MG tablet [id number] No Take 5 mg by mouth at bedtime.  [provider] Not Taking Active Self  levothyroxine (SYNTHROID, LEVOTHROID) 125 MCG tablet [id number] Yes Take 125 mcg by mouth daily. [provider] Taking Active Self           Med Note Arvella Nigh, [facility] A   Tue Oct 09, 2015  3:03 PM)    loratadine (CLARITIN) 10 MG tablet [id number] Yes Take 10 mg by mouth daily.  [provider] Taking Active Self  losartan (COZAAR) 50 MG tablet [id number] Yes Take 1 tablet (50 mg total) by mouth daily. Charlie Pitter, PA-C Taking Active Self           Med Note Corbin Ade   Wed Oct 22, 2016  9:11 PM)    meclizine (ANTIVERT) 25 MG tablet [id number] No Take 1 tablet (25 mg total) by mouth 3 (three) times daily as needed for dizziness.  Patient not taking:  Reported on 12/18/2016   Julianne Rice, MD Not Taking Active Self  nitroGLYCERIN (NITROSTAT) 0.4 MG SL tablet [id number] Yes Place 0.4 mg under the tongue every 5 (five) minutes as needed for chest pain. Reported on 10/04/2015 [provider] Taking Active Self           Med Note Augustin Coupe, [facility]   Sun Apr 02, 2014  2:30 PM) .  polyethylene glycol (MIRALAX / GLYCOLAX) packet [id number] No Take 17 g by mouth daily as needed (constipation). Reported on 10/04/2015 [provider] Not Taking Active Self  spironolactone (ALDACTONE) 50 MG tablet [id number] No Take 25 mg by mouth daily.  [provider] Not Taking Active Self           Med Note Bertell Maria, [facility] A   Wed Nov 26, 2016  9:38 AM) Currently out, [facility] pharmacy for refill  triamcinolone cream (KENALOG) 0.1 % [id number] No Apply 1 application topically daily as needed for itching. [provider] Not Taking Active Self  vitamin B-12 (CYANOCOBALAMIN) 1000 MCG tablet [id number] No Take 1,000 mcg by mouth daily. [provider] Not Taking Active Self  ZETIA 10 MG tablet [id number] Yes TAKE 1 TABLET BY MOUTH EVERY DAY Sueanne Margarita, MD Taking Active Self           Med Note [name] A   Tue Sep 02, 2016  2:27 PM)            Functional Status: In your present state of health, do you have any difficulty performing the following activities:  10/02/2016 06/25/2016  Hearing? N N  Vision? N N  Difficulty concentrating or making decisions? [name]  Walking or climbing stairs? [name]  Comment walks best on level surface, avoids multiple steps , and uses scooters at larger stores  -  Dressing or bathing? N N  Doing errands, shopping? Y Y  Comment husband does the driving  -  Conservation officer, nature and eating ? N N  Using the Toilet? N N  In  the past six months, have you accidently leaked urine? N N  Do you have problems with loss of bowel control? N N  Managing your Medications? [name]  Comment forgets to take at times -  Managing your Finances? N N  Housekeeping or managing your Housekeeping? Y Y  Some recent data might be hidden    Fall/Depression Screening: Fall Risk  11/25/2016 11/19/2016 10/24/2016  Falls in the past year? Yes Yes Yes  Number falls in past yr: 1 1 2  or more  Injury with Fall? No No No  Risk Factor Category  High Fall Risk - High Fall Risk  Risk for fall due to : History of fall(s) - History of fall(s)  Risk for fall due to: Comment - - -  Follow up Falls prevention discussed;Falls evaluation completed Falls evaluation completed Falls prevention discussed   PHQ 2/9 Scores 11/25/2016 11/19/2016 08/19/2016 06/25/2016 05/08/2016 04/15/2016 10/01/2015  PHQ - 2 Score 2 0 2 6 2 2 6   PHQ- 9 Score - - 5 12 6 7 14   Exception Documentation - - - - - - -    Assessment:  Drugs sorted by system:  Neurologic/Psychologic: Meclizine, hydroxyzine  Cardiovascular: aspirin 81mg , atorvastatin, carvedilol, clopidogrel, furosemide, losartan, ezetimibe, PRN NTG  Pulmonary/Allergy:Azelastine, fluticasone, levocetirizine, loratadine  Endocrine: Levothyroxine, levemir  Vitamins/Minerals: Coenzyme J50  Duplications in therapy:    Medications to avoid in the elderly: Meclizine and hydroxzyine: Per the Be[place] List, these medications are highly anticholinergic, clearance may be reduced with advanced age, tolerance may develop when used as a hypnotic  and should be avoided in the elderly. Antihistamine use in the elderly poses a greater risk of confusion, dry mouth, constipation, or other anticholinergic effects or toxicity.   Medication non-compliance:  Mrs. Kinney reports she is not taking atorvastatin, carvedilol, clopidogrel, furosemide, azelastine, fluticasone, levocetirizine, loratadine, or spironolactone.   Bottles of atorvastatin, carvedilol, and furosemide are all expired.    2 additional pill bottles of atorvastatin with different strengths are mixed in with Mrs. Peckinpaugh's medications    Mrs. Prokop reports she is taking levothyroxine, levemir, losartan, and aspirin.   There are multiple bottles of aspirin 81mg  and 325mg  mixed together which may be confusing for patient especially as she has trouble reading prescription labels.     Mrs. Gavigan is almost out of her test strips and lancets.  She uses a glucometer brand from [employer] which has a program for diabetic testing supplies as they cannot bill Part B for patients.      Mrs. Bogle is easily confused on how and when to take her medications, she has vision difficulties reading labels, and has poor insight into what she is supposed to be taking.   I reviewed compliance packaging of her medications to aide in understanding and compliance.  Mrs. Bernabe is agreeable to look into pharmacies that offer this service.    I called Dr. Garen Lah office to relay medication issues noted above.  Mrs. Rabinovich has an appointment on 9/7 and Dr. Chauncy Passy will review her medications with patient.   I called [facility] to request refills on test strips and lancets.    I called [facility] [facility] in [place] and they offer delivery and compliance packaging for patients at no charge.    I attempted to call Mrs. Favero to further discuss compliance packaging but phone line was busy and I was unable to leave a message.   Plan: I will follow-up with patient telephonically tomorrow to  discuss compliance packaging.    Ralene Bathe, PharmD, [place] [phone]

## 2016-12-19 ENCOUNTER — Other Ambulatory Visit: Payer: Self-pay | Admitting: Pharmacist

## 2016-12-19 NOTE — Patient Outreach (Signed)
[place] [facility]) Care Management  12/19/2016  Alyssa [name] Apr 24, 1935 [id number]   Successful phone-call to Alyssa [name] today. HIPAA identifiers verified.  -Alyssa [name] reports she did not go to her office visit with [facility] this morning because she forgot.  She states she will try to reschedule her appointment later today.  -Alyssa [name] is agreeable to try medication compliance packaging and delivery services through [facility].    Successful phone-call to [facility].   -Patient's medication list reviewed with RN. Office staff will reach out to Alyssa [name] to reschedule her appointment.    Successful phone-call to [facility] [employer].   -I reviewed patient's medication list with pharmacist who will contact patient to schedule home visit and to begin transferring prescriptions.  Levothyroxzine and all PRN medications will be kept separate.  Pharmacist will clarify with Alyssa [name] if she would like her carvedilol in the evening or bedtime slot.     Plan: I will follow-up with Alyssa [name] next week to schedule home visit to review compliance packaging once it is delivered and ensure she understands how to use it.    Ralene Bathe, PharmD, [place] [phone]

## 2016-12-23 ENCOUNTER — Other Ambulatory Visit: Payer: Self-pay | Admitting: *Deleted

## 2016-12-23 NOTE — Patient Outreach (Signed)
[place] [facility]) Care Management  12/23/2016  SHERRINE SALBERG 1935/12/02 [id number]   Follow up telephone call to patient, no answer unable to leave a message.   Plan Will place return call to patient in the next 2 days .   [name], RN, [place] Management Coordinator  [phone]- Mobile [phone]- Toll Free Main Office

## 2016-12-24 ENCOUNTER — Other Ambulatory Visit: Payer: Self-pay | Admitting: Cardiology

## 2016-12-24 ENCOUNTER — Other Ambulatory Visit: Payer: Self-pay | Admitting: *Deleted

## 2016-12-24 ENCOUNTER — Telehealth: Payer: Self-pay | Admitting: Cardiology

## 2016-12-24 NOTE — Patient Outreach (Signed)
[place] [facility]) Care Management  12/24/2016  Alyssa [name] 12/14/1935 [id number]   Telephone follow up call to patient, she discussed needing to schedule follow up appointment with her therapist to be able to talk reports feeling a little down, denies self harm thoughts, she discussed following therapist recommendation regarding doing that she enjoys. Patient discussed visiting farmers market on yesterday, and choosing fresh vegetables that she enjoys. Discussed with patient therapist contact number, reports that she has it. Patient discussed that she didn't take care of herself for a while but now she is putting her health first.   Patient discussed her visit to Dentist on Monday, her daughter [name] accompanied her. Patient report she needs to have tooth extraction and plan for dentures. Patient asked about adding dental insurance , reviewed number on the back of her insurance card, she states she will be able to call.  Patient reports that she continues to take her insulin each night,has not checked her blood sugar since last week. Patient reports she has not received refill on strips, but still has some available.  Patient discussed  upcoming medical appointments she has unsure of dates/time, reports does not do a good job of keeping information in one place, she has  visit with Charlott Holler, 9/14 at 2 PM , and Podiatrist visit 9/21.   Plan  Place call to [facility] regarding refill on strips, they report it is available, returned call to patient to inform reports they will pick them up, she has enough to last until Friday.  Will plan follow up call to patient in the next 2 weeks Patient will attend upcoming medical appointments, reviewed appointments,patient will schedule follow up visit with counselor.    [name], RN, [place] Management Coordinator  [phone]- Mobile [phone]- Toll Free Main Office

## 2016-12-25 ENCOUNTER — Other Ambulatory Visit: Payer: Self-pay | Admitting: Pharmacist

## 2016-12-25 ENCOUNTER — Ambulatory Visit: Payer: Self-pay | Admitting: Pharmacist

## 2016-12-25 NOTE — Patient Outreach (Signed)
[place] [facility]) Care Management  12/25/2016  YANELIE ABRAHA 1935-08-26 [id number]  9:14AM Unsuccessful telephone call to Ms.Ahn this morning.  Her husband reports she is not available and requests that I call back in an hour.    11:28 AM: Successful call to Ms.Farace. HIPAA identifiers verified. Patient reports she has rescheduled her office visit with Dr. Chauncy Passy for tomorrow, Sept 14, 2018 at 11:00AM. Patient reports she has been in contact with [facility] but is not sure when she will have her first medication compliance pack delivered.  She states she is still not taking any of her medications and that a big problem for her is that she has to wait 30-60 minutes after taking her thyroid medication before she can take her other morning medications.  She is agreeable for me to visit with her to review her medications after they are delivered by [facility].   11:46 PM: Successful care coordination call to Mitzi Hansen, pharmacist at [facility].  Mitzi Hansen reports that he has completed Ms. Lennon's compliance pack and it is ready to be delivered.  He will schedule a delivery either tomorrow or Monday afternoon depending on Ms. Christoffersen's preference.    Plan: I will call Ms. Poynter on Monday to schedule a home visit on Tuesday or Wednesday of next week to review her new compliance pack and organize her PRN medications.    Ralene Bathe, PharmD, [place] [phone]

## 2016-12-25 NOTE — Telephone Encounter (Signed)
Please keep upcoming appointment 05/14/2017

## 2016-12-26 DIAGNOSIS — Z136 Encounter for screening for cardiovascular disorders: Secondary | ICD-10-CM | POA: Diagnosis not present

## 2016-12-26 DIAGNOSIS — Z0000 Encounter for general adult medical examination without abnormal findings: Secondary | ICD-10-CM | POA: Diagnosis not present

## 2016-12-26 DIAGNOSIS — Z9181 History of falling: Secondary | ICD-10-CM | POA: Diagnosis not present

## 2016-12-26 DIAGNOSIS — E785 Hyperlipidemia, unspecified: Secondary | ICD-10-CM | POA: Diagnosis not present

## 2016-12-26 DIAGNOSIS — E1139 Type 2 diabetes mellitus with other diabetic ophthalmic complication: Secondary | ICD-10-CM | POA: Diagnosis not present

## 2016-12-26 DIAGNOSIS — Z1389 Encounter for screening for other disorder: Secondary | ICD-10-CM | POA: Diagnosis not present

## 2016-12-29 ENCOUNTER — Ambulatory Visit: Payer: Self-pay | Admitting: Pharmacist

## 2016-12-29 ENCOUNTER — Other Ambulatory Visit: Payer: Self-pay | Admitting: Pharmacist

## 2016-12-29 NOTE — Patient Outreach (Signed)
[place] [facility]) Care Management  12/29/2016  Alyssa [name] June 03, 1935 [id number]  Unsuccessful telephone call to [name] today.  I tried several times to call but the line is busy and I am unable to leave a message.    Plan: I will call [name] tomorrow to follow-up on her medication compliance package and to schedule a home visit to review adherence.    Ralene Bathe, PharmD, [place] [phone]

## 2016-12-30 ENCOUNTER — Other Ambulatory Visit: Payer: Self-pay | Admitting: Pharmacist

## 2016-12-30 ENCOUNTER — Ambulatory Visit: Payer: Self-pay | Admitting: Pharmacist

## 2016-12-30 NOTE — Patient Outreach (Signed)
[place] [facility]) Care Management  12/30/2016  Alyssa [name] 1935-10-13 [id number]   Successful outreach call to [name] today.  HIPAA identifiers verified. Patient relays that her medication compliance packs will be delivered today or tomorrow from [facility] (delayed due to weather from [facility]).  She is agreeable to a home visit with me on Thursday to review compliance pack instructions.    Plan: Home visit this Thursday, January 01, 2017 at 2:30 PM.   Ralene Bathe, PharmD, [place] [phone]

## 2017-01-01 ENCOUNTER — Other Ambulatory Visit: Payer: Self-pay | Admitting: Pharmacist

## 2017-01-01 NOTE — Patient Outreach (Signed)
[place] [facility]) Care Management  01/01/2017  Alyssa [name] 1935/10/04 [id number]  Subjective:  Successful home visit with Alyssa [name] today to follow-up on her new compliance packs.  HIPAA identifiers verified.  Alyssa [name] reports she received 4 weekly compliance packs earlier this week from Mitzi Hansen, pharmacist at [facility] but that she has not started using them yet.     Alyssa [name] reports she is checking her CBGs once daily and does not need refills on her test strips and lancets.  She does not remember what her CBG was today.    Alyssa [name] reports she cannot take any medication for at least 2 hours after taking her levothyroxine.    [name] reports she has not rescheduled her appointment with Dr. Chauncy Passy that she missed on Sept 14.    Alyssa [name] has a full milk jug full of used insulin syringes and doesn't know how to dispose of it.    Objective:  Medications Reviewed Today    Reviewed by Rudean Haskell, RPH (Pharmacist) on 01/01/17 at [place] List Status: <None>  Medication Order Taking? Sig Documenting Provider Last Dose Status Informant  aspirin 81 MG chewable tablet [id number] Yes Take 81 mg by mouth daily. [provider] Taking Active Self  atorvastatin (LIPITOR) 80 MG tablet [id number] Yes TAKE 1 TABLET BY MOUTH ONCE DAILY. Sueanne Margarita, MD Taking Active   azelastine (ASTELIN) 0.1 % nasal spray [id number] Yes Place 2 sprays into both nostrils daily as needed for allergies. [provider] Taking Active Self  carvedilol (COREG) 3.125 MG tablet [id number] Yes TAKE 1 TABLET BY MOUTH TWICE DAILY Sueanne Margarita, MD Taking Active         Discontinued 01/01/17 1303 (Patient has not taken in last 30 days)   clopidogrel (PLAVIX) 75 MG tablet [id number] Yes Take 1 tablet (75 mg total) by mouth daily. Sueanne Margarita, MD Taking Active Self  Coenzyme Q10 (COQ10 PO) [id number] Yes Take 1 capsule by mouth daily. Reported on 10/11/2015 [provider] Taking Active Self           Med Note Larwance Sachs A   Tue Oct 09, 2015  3:03 PM)    fluticasone (FLONASE) 50 MCG/ACT nasal spray [id number] Yes Place 2 sprays into both nostrils daily. [provider] Taking Active Self           Med Note Iva Lento, [facility] E   Thu Jan 01, 2017  1:01 PM) Taking PRN  furosemide (LASIX) 20 MG tablet [id number] Yes TAKE 1 TABLET BY MOUTH ONCE DAILY. Sueanne Margarita, MD Taking Active   hydrOXYzine (VISTARIL) 25 MG capsule [id number] Yes 25 mg every 6 (six) hours as needed for itching.  [provider] Taking Active Self  ibuprofen (ADVIL,MOTRIN) 200 MG tablet [id number] Yes Take 400 mg by mouth every 6 (six) hours as needed for mild pain. [provider] Taking Active Self  insulin detemir (LEVEMIR) 100 UNIT/ML injection [id number] Yes Inject 50 Units into the skin at bedtime.  [provider] Taking Active Self  levocetirizine (XYZAL) 5 MG tablet [id number] Yes Take 5 mg by mouth at bedtime.  [provider] Taking Active Self           Med Note Iva Lento, [facility]   Thu Jan 01, 2017  1:03 PM) Taking PRN  levothyroxine (SYNTHROID, LEVOTHROID) 125 MCG tablet [id number] Yes Take 125 mcg by mouth daily. [provider] Taking Active Self  Med Note Larwance Sachs A   Tue Oct 09, 2015  3:03 PM)    loratadine (CLARITIN) 10 MG tablet [id number] Yes Take 10 mg by mouth daily.  [provider] Taking Active Self           Med Note Iva Lento, [facility] Rac[facility] E   Thu Jan 01, 2017  1:01 PM) Taking PRN  losartan (COZAAR) 50 MG tablet [id number] Yes Take 1 tablet (50 mg total) by mouth daily. Charlie Pitter, PA-C Taking Active Self           Med Note Corbin Ade   Wed Oct 22, 2016  9:11 PM)    meclizine (ANTIVERT) 25 MG tablet [id number] No Take 1 tablet (25 mg total) by mouth 3 (three) times daily as needed for dizziness.  Patient not taking:  Reported on 12/18/2016   Julianne Rice, MD Not Taking Active  Self  nitroGLYCERIN (NITROSTAT) 0.4 MG SL tablet [id number] Yes Place 0.4 mg under the tongue every 5 (five) minutes as needed for chest pain. Reported on 10/04/2015 [provider] Taking Active Self           Med Note Augustin Coupe, [facility]   Sun Apr 02, 2014  2:30 PM) .  polyethylene glycol (MIRALAX / GLYCOLAX) packet [id number] Yes Take 17 g by mouth daily as needed (constipation). Reported on 10/04/2015 [provider] Taking Active Self  spironolactone (ALDACTONE) 50 MG tablet [id number] Yes Take 25 mg by mouth daily.  [provider] Taking Active Self           Med Note Earna Coder Jan 01, 2017  1:03 PM)          Discontinued 01/01/17 1302 (Completed Course)         Discontinued 01/01/17 1302 (Dose change)   ZETIA 10 MG tablet [id number] Yes TAKE 1 TABLET BY MOUTH EVERY DAY Sueanne Margarita, MD Taking Active Self           Med Note [name] A   Tue Sep 02, 2016  2:27 PM)             Medication adherence: Alyssa [name] remains non-adherent with most of her medications.  She is taking levemir, levothyroxine, and aspirin.  -Per review of compliance packs, she has not started using them yet.  We reviewed directions and how to use the compliance packs multiple times.  Using the teach-back, Alyssa [name] was able to show me how she will take her medications correctly and took today's morning dose during my visit.   -Levothyroxine remains in a separate pill bottle per patient request and due to administration time  -Aspirin not included in packs as patient has several bottles of 325mg  and 81mg . She has mixed together aspirin 81mg  EC and chewable pills.  She reports that she can tell by the color which pill should be chewed and did not want me to separate them.    -Ezetimibe not included in packs as patient still has > 30 day supply and likely was too early to refill on insurance.   We placed all PRN medications into a separate container.   I removed duplicate bottles of  hydroxyzine, losartan, and clopidogrel so patient will not be confused with medications in compliance pack vs pill bottles.      I counseled Alyssa [name] that she can take her medications within 30-60 minutes after her levothyroxine however she should avoid antacids at least 2-4 hours after levothyroxine dose.  Plan: I will follow-up with Alyssa [name] next week to see how she is doing with her compliance packs.  I will find out where she can dispose of her insulin syringes.    Ralene Bathe, PharmD, [place] [phone]

## 2017-01-02 ENCOUNTER — Encounter: Payer: Self-pay | Admitting: Podiatry

## 2017-01-02 ENCOUNTER — Ambulatory Visit (INDEPENDENT_AMBULATORY_CARE_PROVIDER_SITE_OTHER): Payer: PPO | Admitting: Podiatry

## 2017-01-02 VITALS — BP 130/64 | HR 74 | Resp 16

## 2017-01-02 DIAGNOSIS — L600 Ingrowing nail: Secondary | ICD-10-CM

## 2017-01-02 DIAGNOSIS — M898X9 Other specified disorders of bone, unspecified site: Secondary | ICD-10-CM

## 2017-01-07 ENCOUNTER — Ambulatory Visit: Payer: Self-pay | Admitting: Pharmacist

## 2017-01-07 ENCOUNTER — Other Ambulatory Visit: Payer: Self-pay | Admitting: *Deleted

## 2017-01-07 NOTE — Patient Outreach (Signed)
[place] [facility]) Care Management  01/07/2017  Alyssa [name] [birth year] [id number]   Telephone follow up   Spoke with patient reports she is doing fairly well on today. Patient discussed her recent visit to podiatry and nail trimming that was done .  Patient reports she continues to take insulin Lantus 50 units each nite, but she is not checking blood sugar on a regular basis. She admits to watching what she is eating .  Patient discussed having loose teeth and has had several teeth to fall out, she had recent dental and planned follow up visit to address concerns, she states the Dentist will be out of country for about a month but she still wants to wait on her.   Patient discussed she has received a new meter, lancet, strips, syringes  supplies from new pharmacy that provides pill packaging, and she might need some help with new meter. Patient unsure if she rescheduled her previously cancelled PCP visit, requested assistance with rescheduling .   Plan Will plan home visit in the next week to continue to provide education and support of diabetes.  Placed call to Charlott Holler office to verify if patient had rescheduled office visit, she had not , scheduled visit for 10/2 at 2:30 pm for diabetes follow up  and informed patient she is in agreement she was able to restate visit date and state she wrote appointment information on calendar.    [name], RN, [place] Management Coordinator  [phone]- Mobile [phone]- Toll Free Main Office

## 2017-01-08 ENCOUNTER — Ambulatory Visit: Payer: Self-pay | Admitting: Pharmacist

## 2017-01-08 ENCOUNTER — Other Ambulatory Visit: Payer: Self-pay | Admitting: Pharmacist

## 2017-01-08 NOTE — Patient Outreach (Addendum)
[facility]) Care Management  01/08/2017  SHELIA KINGSBERRY [birth year] [id number]   Successful telephone visit with Ms. Wandrey today. HIPAA identifiers verified.   Ms. Sherrow reports she has been taking her medications from the compliance packs and has started her second weekly pack today.  She reports that she has not missed any doses. She states she does not feel any differently after starting back on her medications.    Ms. Odonohue has not been checking her blood sugar and does not know the most recent reading.     Ms. Julius reports that [facility] brought her insulin syringes that are too long.  She is going to call the pharmacy to request shorter needles.    I encouraged Ms. Gerke to continue to take her medications from the compliance packs and to check her blood sugar. She is aware she can call me with any questions or concerns. I will follow-up with her next week to review compliance again.   Plan: Home visit with Ms. Radford on Thursday, Oct 4th, at 12:00.    Ralene Bathe, PharmD, [place] [phone]

## 2017-01-11 NOTE — Progress Notes (Signed)
Subjective:  Patient ID: Alyssa [name], female    DOB: 01/09/1936,  MRN: [id number] HPI Chief Complaint  Patient presents with  . Toe Pain    Hallux left - patient states she had nail removed years ago and now piece has grown back, red soft lump at tip of toe, tender    81 y.o. female presents with the above complaint. Reports pain at the end of her left great toe with a "knot". States she had part of her nail removed years ago. Has had pain since without permanent relief.  Past Medical History:  Diagnosis Date  . Anemia   . CAD (coronary artery disease)    a. 2013 NSTEMI s/p DES to LAD. b. 04/03/14: NSTEMI s/p DES to OM1, DES to dRCA.  [name] Chronic diastolic CHF (congestive heart failure) ([place])    a. 2D ECHO 04/02/14 with hypokinesis in the basal inferior and inferoseptal walls. Focal and moderate concentric LVH. Overall LVEF 60-65%. G1DD. Elevated EDLV filling pressures. Mild TR.  [name] GERD (gastroesophageal reflux disease)   . History of GI bleed    a. 2013: adm for ABL anemia. 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  [name] HLD (hyperlipidemia)   . HTN (hypertension)   . Hypothyroid   . Peripheral vascular disease ([place])   . Refusal of blood transfusions as patient is Jehovah's Witness 09-29-12  . Type II diabetes mellitus ([place])    Past Surgical History:  Procedure Laterality Date  . CESAREAN SECTION  ~ 1961; 1966   plus 3 NVD  . COLONOSCOPY  02/27/2012   Procedure: COLONOSCOPY;  Surgeon: Lear Ng, MD;  Location: [facility] ENDOSCOPY;  Service: Endoscopy;  Laterality: N/A;  . CORONARY ANGIOPLASTY WITH STENT PLACEMENT  05/2011; 04/03/2014  . ESOPHAGOGASTRODUODENOSCOPY  02/26/2012   Procedure: ESOPHAGOGASTRODUODENOSCOPY (EGD);  Surgeon: Lear Ng, MD;  Location: [facility] ENDOSCOPY;  Service: Endoscopy;  Laterality: N/A;  . LEFT HEART CATHETERIZATION WITH CORONARY ANGIOGRAM N/A 06/12/2011    Procedure: LEFT HEART CATHETERIZATION WITH CORONARY ANGIOGRAM;  Surgeon: Josue Hector, MD;  Location: [facility] CATH LAB;  Service: Cardiovascular;  Laterality: N/A;  . LEFT HEART CATHETERIZATION WITH CORONARY ANGIOGRAM N/A 04/03/2014   Procedure: LEFT HEART CATHETERIZATION WITH CORONARY ANGIOGRAM;  Surgeon: Jettie Booze, MD;  Location: [facility] CATH LAB;  Service: Cardiovascular;  Laterality: N/A;  . TONSILLECTOMY    . VAGINAL HYSTERECTOMY      Current Outpatient Prescriptions:  .  aspirin 81 MG chewable tablet, Take 81 mg by mouth daily., Disp: , Rfl:  .  atorvastatin (LIPITOR) 80 MG tablet, TAKE 1 TABLET BY MOUTH ONCE DAILY., Disp: 30 tablet, Rfl: 11 .  azelastine (ASTELIN) 0.1 % nasal spray, Place 2 sprays into both nostrils daily as needed for allergies., Disp: , Rfl:  .  carvedilol (COREG) 3.125 MG tablet, TAKE 1 TABLET BY MOUTH TWICE DAILY, Disp: 60 tablet, Rfl: 5 .  clopidogrel (PLAVIX) 75 MG tablet, Take 1 tablet (75 mg total) by mouth daily., Disp: 90 tablet, Rfl: 1 .  Coenzyme Q10 (COQ10 PO), Take 1 capsule by mouth daily. Reported on 10/11/2015, Disp: , Rfl:  .  fluticasone (FLONASE) 50 MCG/ACT nasal spray, Place 2 sprays into both nostrils daily., Disp: , Rfl:  .  furosemide (LASIX) 20 MG tablet, TAKE 1 TABLET BY MOUTH ONCE DAILY., Disp: 30 tablet, Rfl: 11 .  hydrOXYzine (VISTARIL) 25 MG capsule, 25 mg every 6 (six) hours as  needed for itching. , Disp: , Rfl:  .  ibuprofen (ADVIL,MOTRIN) 200 MG tablet, Take 400 mg by mouth every 6 (six) hours as needed for mild pain., Disp: , Rfl:  .  insulin detemir (LEVEMIR) 100 UNIT/ML injection, Inject 50 Units into the skin at bedtime. , Disp: , Rfl:  .  levocetirizine (XYZAL) 5 MG tablet, Take 5 mg by mouth at bedtime. , Disp: , Rfl:  .  levothyroxine (SYNTHROID, LEVOTHROID) 125 MCG tablet, Take 125 mcg by mouth daily., Disp: , Rfl:  .  loratadine (CLARITIN) 10 MG tablet, Take 10 mg by mouth daily. , Disp: , Rfl:  .  losartan (COZAAR) 50 MG  tablet, Take 1 tablet (50 mg total) by mouth daily., Disp: 90 tablet, Rfl: 3 .  meclizine (ANTIVERT) 25 MG tablet, Take 1 tablet (25 mg total) by mouth 3 (three) times daily as needed for dizziness. (Patient not taking: Reported on 12/18/2016), Disp: 30 tablet, Rfl: 0 .  nitroGLYCERIN (NITROSTAT) 0.4 MG SL tablet, Place 0.4 mg under the tongue every 5 (five) minutes as needed for chest pain. Reported on 10/04/2015, Disp: , Rfl:  .  polyethylene glycol (MIRALAX / GLYCOLAX) packet, Take 17 g by mouth daily as needed (constipation). Reported on 10/04/2015, Disp: , Rfl:  .  spironolactone (ALDACTONE) 50 MG tablet, Take 25 mg by mouth daily. , Disp: , Rfl:  .  triamcinolone cream (KENALOG) 0.1 %, , Disp: , Rfl:  .  TRUEPLUS INSULIN SYRINGE 31G X 5/16" 0.5 ML MISC, , Disp: , Rfl:  .  ZETIA 10 MG tablet, TAKE 1 TABLET BY MOUTH EVERY DAY, Disp: 30 tablet, Rfl: 6  No Known Allergies Review of Systems Objective:   Vitals:   01/02/17 1524  BP: 130/64  Pulse: 74  Resp: 16   General AA&O x3. Normal mood and affect.  Vascular Dorsalis pedis and posterior tibial pulses 2/4 bilat. Brisk capillary refill to all digits. Pedal hair present.  Neurologic Epicritic sensation grossly intact.  Dermatologic No open lesions. Interspaces clear of maceration. Nails well groomed and normal in appearance. L hallux nail without ingrowing nail. Nail with central elevation likely d/t underlying osteophyte.  Orthopedic: MMT 5/5 in dorsiflexion, plantarflexion, inversion, and eversion. Normal joint ROM without pain or crepitus.   Assessment & Plan:  Patient was evaluated and treated and all questions answered.  ?Ingrown Nail -Discussed with patient that I do not see any evidence of ingrown nail. -Discussed the "knot" she is feeling is likely a subungual osteophyte. -Discussed possible removal if desired. Will defer for now.  Follow up PRN.

## 2017-01-13 DIAGNOSIS — F329 Major depressive disorder, single episode, unspecified: Secondary | ICD-10-CM | POA: Diagnosis not present

## 2017-01-13 DIAGNOSIS — I10 Essential (primary) hypertension: Secondary | ICD-10-CM | POA: Diagnosis not present

## 2017-01-13 DIAGNOSIS — E039 Hypothyroidism, unspecified: Secondary | ICD-10-CM | POA: Diagnosis not present

## 2017-01-13 DIAGNOSIS — E663 Overweight: Secondary | ICD-10-CM | POA: Diagnosis not present

## 2017-01-13 DIAGNOSIS — R609 Edema, unspecified: Secondary | ICD-10-CM | POA: Diagnosis not present

## 2017-01-13 DIAGNOSIS — E1139 Type 2 diabetes mellitus with other diabetic ophthalmic complication: Secondary | ICD-10-CM | POA: Diagnosis not present

## 2017-01-15 ENCOUNTER — Other Ambulatory Visit: Payer: Self-pay | Admitting: *Deleted

## 2017-01-15 ENCOUNTER — Other Ambulatory Visit: Payer: Self-pay | Admitting: Pharmacist

## 2017-01-15 ENCOUNTER — Encounter: Payer: Self-pay | Admitting: *Deleted

## 2017-01-15 NOTE — Patient Outreach (Signed)
[facility]) Care Management  01/15/2017  ZOE CREASMAN 11/28/1935 [id number]  Successful home visit with Ms. Ferrell, her husband, and [facility] RN [name].  HIPAA identifiers verified.  Subjective: Ms. Dezeeuw reports she is doing well and has been taking her medications.  She states she was prescribed an antidepressant at her recent office visit earlier this week with Charlott Holler but she has not yet received it from her pharmacy.  She also reports she spoke with her pharmacy to request shorter needles for her insulin and was told they would exchange her current supply with a shorter needle at the next delivery of medications.  She received a new glucometer and diabetic testing supplies but has not started using it yet.  She reports her last CBG check was about 1 week ago.   Objective: Labs 01/13/2017: Hemoglobin A1c: 8.8  Labs 12/26/2016: SCr 0.78  Medications Reviewed Today    Reviewed by Rudean Haskell, RPH (Pharmacist) on 01/08/17 at [place] List Status: <None>  Medication Order Taking? Sig Documenting Provider Last Dose Status Informant  aspirin 81 MG chewable tablet [id number] Yes Take 81 mg by mouth daily. [provider] Taking Active Self  atorvastatin (LIPITOR) 80 MG tablet [id number] Yes TAKE 1 TABLET BY MOUTH ONCE DAILY. Sueanne Margarita, MD Taking Active   azelastine (ASTELIN) 0.1 % nasal spray [id number] No Place 2 sprays into both nostrils daily as needed for allergies. [provider] Not Taking Active Self  carvedilol (COREG) 3.125 MG tablet [id number] Yes TAKE 1 TABLET BY MOUTH TWICE DAILY Turner, Eber Hong, MD Taking Active   clopidogrel (PLAVIX) 75 MG tablet [id number] Yes Take 1 tablet (75 mg total) by mouth daily. Sueanne Margarita, MD Taking Active Self  Coenzyme Q10 (COQ10 PO) [id number] Yes Take 1 capsule by mouth daily. Reported on 10/11/2015 [provider] Taking Active Self           Med Note Larwance Sachs A   Tue Oct 09, 2015   3:03 PM)    fluticasone (FLONASE) 50 MCG/ACT nasal spray [id number] No Place 2 sprays into both nostrils daily. [provider] Not Taking Active Self           Med Note Iva Lento, Kumar Falwell E   Thu Jan 01, 2017  1:01 PM) Taking PRN  furosemide (LASIX) 20 MG tablet [id number] Yes TAKE 1 TABLET BY MOUTH ONCE DAILY. Sueanne Margarita, MD Taking Active   hydrOXYzine (VISTARIL) 25 MG capsule [id number] No 25 mg every 6 (six) hours as needed for itching.  [provider] Not Taking Active Self  ibuprofen (ADVIL,MOTRIN) 200 MG tablet [id number] No Take 400 mg by mouth every 6 (six) hours as needed for mild pain. [provider] Not Taking Active Self  insulin detemir (LEVEMIR) 100 UNIT/ML injection [id number] Yes Inject 50 Units into the skin at bedtime.  [provider] Taking Active Self  levocetirizine (XYZAL) 5 MG tablet [id number] No Take 5 mg by mouth at bedtime.  [provider] Not Taking Active Self           Med Note Iva Lento, Latif Nazareno E   Thu Jan 01, 2017  1:03 PM) Taking PRN  levothyroxine (SYNTHROID, LEVOTHROID) 125 MCG tablet [id number] Yes Take 125 mcg by mouth daily. [provider] Taking Active Self           Med Note Arvella Nigh, Lilia Pro A   Tue Oct 09, 2015  3:03 PM)    loratadine (CLARITIN)  10 MG tablet [id number] No Take 10 mg by mouth daily.  [provider] Not Taking Active Self           Med Note (Anjolina Byrer E   Thu Jan 01, 2017  1:01 PM) Taking PRN  losartan (COZAAR) 50 MG tablet [id number] Yes Take 1 tablet (50 mg total) by mouth daily. Charlie Pitter, PA-C Taking Active Self           Med Note Corbin Ade   Wed Oct 22, 2016  9:11 PM)    meclizine (ANTIVERT) 25 MG tablet [id number] No Take 1 tablet (25 mg total) by mouth 3 (three) times daily as needed for dizziness.  Patient not taking:  Reported on 12/18/2016   Julianne Rice, MD Not Taking Active Self  nitroGLYCERIN (NITROSTAT) 0.4 MG SL tablet [id number] No Place 0.4 mg  under the tongue every 5 (five) minutes as needed for chest pain. Reported on 10/04/2015 [provider] Not Taking Active Self           Med Note Augustin Coupe, [facility]   Sun Apr 02, 2014  2:30 PM) .  polyethylene glycol (MIRALAX / GLYCOLAX) packet [id number] No Take 17 g by mouth daily as needed (constipation). Reported on 10/04/2015 [provider] Not Taking Active Self  spironolactone (ALDACTONE) 50 MG tablet [id number] Yes Take 25 mg by mouth daily.  [provider] Taking Active Self           Med Note Iva Lento, Octavio Graves   Thu Jan 01, 2017  1:03 PM)    triamcinolone cream (KENALOG) 0.1 % [id number] No  [provider] Not Taking Active   TRUEPLUS INSULIN SYRINGE 31G X 5/16" 0.5 ML MISC [id number] Yes  [provider] Taking Active   ZETIA 10 MG tablet [id number] Yes TAKE 1 TABLET BY MOUTH EVERY DAY Sueanne Margarita, MD Taking Active Self           Med Note [name] A   Tue Sep 02, 2016  2:27 PM)           Assessment:  Medication adherence: Per review of patient's compliance packs, adherence is much improved.  Patient has only missed 2 doses from her last 14 days.  I counseled her to not double up on medications if she forgets a dose and it it is already time for the next scheduled administration and to leave the pills in the compliance pack to avoid confusion.  Patient voiced understanding.    I spoke with Pandora, receptionist at her provider's office, and requested an office visit summary from Tuesday be faxed to [facility].    I spoke with [facility] to request a new bottle of OTC aspirin 81mg  to be sent with next medication delivery.  The pharmacy has received her new prescription for escitalopram and wil deliver a 14 day supply likely tomorrow.  They will include the aspirin as well as new insulin syringes.  The escitalopram will be included in her next monthly supply of compliance packs.   Ms. Verdone was counseled on how to use her new  glucometer and reports she will start checking her CBGs daily.   Plan: I will follow-up with Ms. Noyola in 2 weeks to review her medication compliance.    Ralene Bathe, PharmD, [employer] [phone]

## 2017-01-15 NOTE — Patient Outreach (Signed)
[facility]) Care Management   01/15/2017  Alyssa [name] Oct 26, 1935 [id number]  Alyssa [name] is an 81 y.o. female  Subjective:  Patient reports she is doing some better lately. Patient discussed visit to PCP office on this week and reports she will see new MD at her next visit in January.   Patient discussed she has been started on a new medication for depression but she hasn't started it yet. Patient states she plans to make a follow up visit her her therapist .   Objective:  BP 112/60 (BP Location: Right Arm, Patient Position: Sitting, Cuff Size: Normal)   Pulse 83   Resp 18   SpO2 97%  Review of Systems  Constitutional: Negative.   HENT: Negative.   Respiratory: Negative.   Cardiovascular: Negative.   Gastrointestinal: Negative.   Genitourinary: Negative.   Musculoskeletal: Negative.   Skin: Negative.   Neurological: Negative.   Endo/Heme/Allergies: Negative.   Psychiatric/Behavioral: Positive for depression.    Physical Exam  Constitutional: She is oriented to person, place, and time. She appears well-developed and well-nourished.  Cardiovascular: Normal rate and normal heart sounds.   Respiratory: Effort normal and breath sounds normal.  GI: Soft.  Neurological: She is alert and oriented to person, place, and time.  Skin: Skin is warm and dry.  Psychiatric: She has a normal mood and affect. Her behavior is normal. Judgment and thought content normal.    Encounter Medications:   Outpatient Encounter Prescriptions as of 01/15/2017  Medication Sig Note  . aspirin 81 MG chewable tablet Take 81 mg by mouth daily.   [name] atorvastatin (LIPITOR) 80 MG tablet TAKE 1 TABLET BY MOUTH ONCE DAILY.   [name] azelastine (ASTELIN) 0.1 % nasal spray Place 2 sprays into both nostrils daily as needed for allergies.   . carvedilol (COREG) 3.125 MG tablet TAKE 1 TABLET BY MOUTH TWICE DAILY   . clopidogrel (PLAVIX) 75 MG tablet Take 1 tablet (75 mg total) by mouth daily.   .  Coenzyme Q10 (COQ10 PO) Take 1 capsule by mouth daily. Reported on 10/11/2015   . fluticasone (FLONASE) 50 MCG/ACT nasal spray Place 2 sprays into both nostrils daily. 01/01/2017: Taking PRN  . furosemide (LASIX) 20 MG tablet TAKE 1 TABLET BY MOUTH ONCE DAILY.   . hydrOXYzine (VISTARIL) 25 MG capsule 25 mg every 6 (six) hours as needed for itching.    [name] ibuprofen (ADVIL,MOTRIN) 200 MG tablet Take 400 mg by mouth every 6 (six) hours as needed for mild pain.   [name] insulin detemir (LEVEMIR) 100 UNIT/ML injection Inject 50 Units into the skin at bedtime.    [name] levocetirizine (XYZAL) 5 MG tablet Take 5 mg by mouth at bedtime.  01/01/2017: Taking PRN  . levothyroxine (SYNTHROID, LEVOTHROID) 125 MCG tablet Take 125 mcg by mouth daily.   [name] loratadine (CLARITIN) 10 MG tablet Take 10 mg by mouth daily.  01/01/2017: Taking PRN  . losartan (COZAAR) 50 MG tablet Take 1 tablet (50 mg total) by mouth daily.   . meclizine (ANTIVERT) 25 MG tablet Take 1 tablet (25 mg total) by mouth 3 (three) times daily as needed for dizziness. (Patient not taking: Reported on 12/18/2016)   . nitroGLYCERIN (NITROSTAT) 0.4 MG SL tablet Place 0.4 mg under the tongue every 5 (five) minutes as needed for chest pain. Reported on 10/04/2015 04/02/2014: .  . polyethylene glycol (MIRALAX / GLYCOLAX) packet Take 17 g by mouth daily as needed (constipation). Reported on 10/04/2015   .  spironolactone (ALDACTONE) 50 MG tablet Take 25 mg by mouth daily.    [name] triamcinolone cream (KENALOG) 0.1 %    . TRUEPLUS INSULIN SYRINGE 31G X 5/16" 0.5 ML MISC    . ZETIA 10 MG tablet TAKE 1 TABLET BY MOUTH EVERY DAY    No facility-administered encounter medications on file as of 01/15/2017.     Functional Status:   In your present state of health, do you have any difficulty performing the following activities: 10/02/2016 06/25/2016  Hearing? N N  Vision? N N  Difficulty concentrating or making decisions? [name]  Walking or climbing stairs? [name]  Comment walks best  on level surface, avoids multiple steps , and uses scooters at larger stores  -  Dressing or bathing? N N  Doing errands, shopping? Y Y  Comment husband does the driving  -  Conservation officer, nature and eating ? N N  Using the Toilet? N N  In the past six months, have you accidently leaked urine? N N  Do you have problems with loss of bowel control? N N  Managing your Medications? [name]  Comment forgets to take at times -  Managing your Finances? N N  Housekeeping or managing your Housekeeping? Y Y  Some recent data might be hidden    Fall/Depression Screening:    Fall Risk  11/25/2016 11/19/2016 10/24/2016  Falls in the past year? Yes Yes Yes  Number falls in past yr: _0 or more  Injury with Fall? No No No  Risk Factor Category  High Fall Risk - High Fall Risk  Risk for fall due to : History of fall(s) - History of fall(s)  Risk for fall due to: Comment - - -  Follow up Falls prevention discussed;Falls evaluation completed Falls evaluation completed Falls prevention discussed   PHQ 2/9 Scores 11/25/2016 11/19/2016 08/19/2016 06/25/2016 05/08/2016 04/15/2016 10/01/2015  PHQ - 2 Score 2 0 _1 PHQ- 9 Score - - _2 Exception Documentation - - - - - - -    Assessment:  Routine covisit in home with Lenis Dickinson, pharmacist  Diabetes : Patient admits to taking insulin daily, her recent A1c is down to 8.8 on 01/13/2017, compared to 11.3 on 5/31.  She has only checked blood sugar once in the last 3 weeks reading 243 per meter,  but states she going to start checking it daily, cbg during visit 343,  checked within 1 hour of eating cereal with milk and fruit for breakfast.  She has a new meter and supplies, she has requested insulin syringe with shorter needle for increased comfort from [facility], patient  will need review and return demonstration on meter use . Annual wellness - Patient has completed eye exam- she has decided on cataract removal, cancelled recent surgery. Patient has  completed recent dental exam plans for follow up visit for dental extraction for loose teeth. Moderate fall risk - asking about medical alert button, denies further dizziness no falls.  Depression - new medication for depression prescribed at recent PCP visit. Patient agreeable to continued follow up with therapist and plans to schedule next visit. Patient more organized keeping next MD appointment posted. She has hired assistance with housekeeping once weekly.   Discussed with patient telephonic health coach program for continued education and support of diabetes, patient reports she may be interested.  Plan  1. Will assist with setup of new meter and education  on monitoring blood sugars  2.Reinforced continued followup and rescheduling dental visit,when Dentist return to town. 3.Placed call to [place] medical alert system, able to leave a message requesting return call. 4. Assisted patient locating  contact information for  therapist Julie Whitt and placed on refrigerator, to arrange next visit .  [facility] CM Care Plan Problem One     Most Recent Value  Care Plan Problem One  Knowledge deficit related to Diabetes self care management   Role Documenting the Problem One  Care Management Coordinator  Care Plan for Problem One  Active  THN Long Term Goal   Patient will report decrease in A1c  in the next 60 days   THN Long Term Goal Start Date  11/25/16  Interventions for Problem One Long Term Goal  Praised patient for progress and encouraged to continue daily insulin as prescribed.   THN CM Short Term Goal #1   Patient will report attending appointment with counselor in the next 30 days   [facility]N CM Short Term Goal #1 Start Date  11/25/16  [facility] CM Short Term Goal #1 Met Date  12/02/16  THN CM Short Term Goal #2   Patient will report checking blood sugar at least 5  days a week in the in the next 30 days  [goal restated ]  [facility]N CM Short Term Goal #2 Start Date  01/15/17 [goal date restart ]  Interventions  for Short Term Goal #2  Setup meter, reviewed return demonstration on using meter, verified she has all supplies, provided [facility] calendar and education on how to use to log readings, place calendar at space of medications and diabetes tesing supplies.    THN CM Short Term Goal #3  Patient will report no falls in the next 30 days   THN CM Short Term Goal #3 Start Date  11/25/16  [facility]N CM Short Term Goal #3 Met Date  12/24/16  THN CM Short Term Goal #4  Patient will reports keeping all Medical appointments in the next 30 days   THN CM Short Term Goal #4 Start Date  12/04/16  [facility] CM Short Term Goal #4 Met Date  01/15/17      , RN, PCCN [facility] Care Management,Care Management Coordinator  [phone]- Mobile [phone]- Toll Free Main Office  Will plan follow up call in the next week, to check on blood sugar readings, and scheduling of therapist visit.    THN CM Care Plan Problem One     Most Recent Value  Care Plan Problem One  Knowledge deficit related to Diabetes self care management   Role Documenting the Problem One  Care Management Coordinator  Care Plan for Problem One  Active  THN Long Term Goal   Patient will report decrease in A1c  in the next 60 days   THN Long Term Goal Start Date  11/25/16  Interventions for Problem One Long Term Goal  Praised patient for progress and encouraged to continue daily insulin as prescribed.   [facility]N CM Short Term Goal #1   Patient will report attending appointment with counselor in the next 30 days   [facility]N CM Short Term Goal #1 Start Date  11/25/16  [facility] CM Short Term Goal #1 Met Date  12/02/16  [facility] CM Short Term Goal #2   Patient will report checking blood sugar at least 5  days a week in the in the next 30 days  [goal restated ]  THN CM Short Term Goal #2 Start Date    01/15/17 [goal date restart ]  Interventions for Short Term Goal #2  Setup meter, reviewed return demonstration on using meter, verified she has all supplies, provided [facility] calendar  and education on how to use to log readings, place calendar at space of medications and diabetes tesing supplies.    THN CM Short Term Goal #3  Patient will report no falls in the next 30 days   THN CM Short Term Goal #3 Start Date  11/25/16  [facility] CM Short Term Goal #3 Met Date  12/24/16  THN CM Short Term Goal #4  Patient will reports keeping all Medical appointments in the next 30 days   THN CM Short Term Goal #4 Start Date  12/04/16  [facility] CM Short Term Goal #4 Met Date  01/15/17            .

## 2017-01-22 ENCOUNTER — Other Ambulatory Visit: Payer: Self-pay | Admitting: *Deleted

## 2017-01-22 NOTE — Patient Outreach (Signed)
[place] [facility]) Care Management  01/22/2017  LYNESSA ALMANZAR May 15, 1935 [id number]  Telephone follow up call   Spoke with patient, reports she is doing pretty good. She discussed she continues to take insulin daily and has checked her blood sugar 2 times in the last week, unable to recall readings  Patient discussed dentist appointment that she had on today, and plans to move forward with dental work.  Patient discussed she plan meeting with her insurance representative regarding plan for next year and considering adding dental benefit.   Patient declines pursuing medical alert button at this time but willing to discuss at next visit.   Discussed telephonic health coach program for continued education and support of her diabetes control with improved A1c.  Plan  Plan home visit in the next 2 weeks and possible case closure if no new home care management needs to be addressed, transition to health coach.   [name], RN, [place] Management Coordinator  [phone]- Mobile [phone]- Toll Free Main Office

## 2017-01-30 ENCOUNTER — Other Ambulatory Visit: Payer: Self-pay | Admitting: Pharmacist

## 2017-01-30 ENCOUNTER — Ambulatory Visit: Payer: Self-pay | Admitting: Pharmacist

## 2017-01-30 NOTE — Patient Outreach (Signed)
[place] [facility]) Care Management  01/30/2017  Alyssa [name] 08/05/1935 [id number]  12:03PM Unsuccessful telephonic outreach attempt to Alyssa [name] today.  I spoke with her husband who stated that Alyssa [name] is still sleeping.  He requests that I try calling again this afternoon.     1:38PM Successful telephone call with Alyssa [name] this afternoon.  She reports she is having an allergy attack but has not taken any medications.  We reviewed her PRN medications including multiple antihistamines.  I recommended that she use only one antihistamine at a time as they can make her feel drowsy.  Alyssa [name] reports she is checking CBGs once daily but not at a consistent time of day and often after she eats.  She reports her CBGs have sometimes been in the 200s.  I advised that she try to check a fasting CBG when she takes her synthroid medication and at least 2 hours after a meal.    Alyssa [name] reports she has missed several morning medication doses because she waits to take her oral pills until at least 2 hours after taking synthroid.  I educated her that she may take her oral medications with her synthroid and she does not have to wait 2 hours.  It is only recommended to avoid iron and calcium products with synthroid due to drugs binding together.   Alyssa [name] voiced understanding.  She reports she has a new set of bubble packs delivered from [facility].  She will start week 1 today.  She reports she received shorter needles for her insulin but still has the longer needles as well.  I advised her to call [facility] to discuss if they will take these packages back with next delivery.    Plan: I will schedule a home visit with Alyssa [name] on February 09, 2017 to review medication adherence with bubble packs.    Ralene Bathe, PharmD, [place] [phone]

## 2017-02-05 ENCOUNTER — Other Ambulatory Visit: Payer: Self-pay | Admitting: *Deleted

## 2017-02-05 NOTE — Patient Outreach (Signed)
[place] [facility]) Care Management   02/05/2017  Alyssa [name] [birth year] [id number]  Alyssa [name] is an 81 y.o. female  Subjective:  Patient complaint of feeling tired due to not sleeping well last night, discussed left foot discomfort due to standing a long time last evening due to car trouble.   Objective:  BP 112/62 (BP Location: Right Arm, Patient Position: Sitting, Cuff Size: Normal)   Pulse 65   Resp 18   SpO2 96%  Review of Systems  Constitutional: Negative.   HENT: Negative.   Eyes: Negative.   Respiratory: Negative.   Cardiovascular: Negative.   Gastrointestinal: Negative.   Genitourinary: Negative.   Musculoskeletal: Negative.   Skin: Negative.   Neurological: Negative.   Endo/Heme/Allergies: Bruises/bleeds easily.  Psychiatric/Behavioral:       Reports being forgetful     Physical Exam  Constitutional: She is oriented to person, place, and time. She appears well-developed and well-nourished.  Cardiovascular: Normal rate, normal heart sounds and intact distal pulses.   Respiratory: Effort normal and breath sounds normal.  GI: Soft.  Musculoskeletal: Normal range of motion.  Neurological: She is alert and oriented to person, place, and time.  Skin: Skin is warm and dry.     Psychiatric: She has a normal mood and affect. Her behavior is normal. Judgment and thought content normal.    Encounter Medications:   Outpatient Encounter Prescriptions as of 02/05/2017  Medication Sig Note  . aspirin 81 MG chewable tablet Take 81 mg by mouth daily.   [name] atorvastatin (LIPITOR) 80 MG tablet TAKE 1 TABLET BY MOUTH ONCE DAILY.   [name] azelastine (ASTELIN) 0.1 % nasal spray Place 2 sprays into both nostrils daily as needed for allergies.   . carvedilol (COREG) 3.125 MG tablet TAKE 1 TABLET BY MOUTH TWICE DAILY   . clopidogrel (PLAVIX) 75 MG tablet Take 1 tablet (75 mg total) by mouth daily.   . Coenzyme Q10 (COQ10 PO) Take 1 capsule by mouth daily. Reported on  10/11/2015   . fluticasone (FLONASE) 50 MCG/ACT nasal spray Place 2 sprays into both nostrils daily. 01/01/2017: Taking PRN  . furosemide (LASIX) 20 MG tablet TAKE 1 TABLET BY MOUTH ONCE DAILY.   . hydrOXYzine (VISTARIL) 25 MG capsule 25 mg every 6 (six) hours as needed for itching.    [name] ibuprofen (ADVIL,MOTRIN) 200 MG tablet Take 400 mg by mouth every 6 (six) hours as needed for mild pain.   [name] insulin detemir (LEVEMIR) 100 UNIT/ML injection Inject 50 Units into the skin at bedtime.    [name] levocetirizine (XYZAL) 5 MG tablet Take 5 mg by mouth at bedtime.  01/01/2017: Taking PRN  . levothyroxine (SYNTHROID, LEVOTHROID) 125 MCG tablet Take 125 mcg by mouth daily.   [name] loratadine (CLARITIN) 10 MG tablet Take 10 mg by mouth daily.  01/01/2017: Taking PRN  . losartan (COZAAR) 50 MG tablet Take 1 tablet (50 mg total) by mouth daily.   . meclizine (ANTIVERT) 25 MG tablet Take 1 tablet (25 mg total) by mouth 3 (three) times daily as needed for dizziness. (Patient not taking: Reported on 12/18/2016)   . nitroGLYCERIN (NITROSTAT) 0.4 MG SL tablet Place 0.4 mg under the tongue every 5 (five) minutes as needed for chest pain. Reported on 10/04/2015 04/02/2014: .  . polyethylene glycol (MIRALAX / GLYCOLAX) packet Take 17 g by mouth daily as needed (constipation). Reported on 10/04/2015   . spironolactone (ALDACTONE) 50 MG tablet Take 25 mg by mouth daily.    [name]  triamcinolone cream (KENALOG) 0.1 %    . TRUEPLUS INSULIN SYRINGE 31G X 5/16" 0.5 ML MISC    . ZETIA 10 MG tablet TAKE 1 TABLET BY MOUTH EVERY DAY    No facility-administered encounter medications on file as of 02/05/2017.     Functional Status:   In your present state of health, do you have any difficulty performing the following activities: 01/15/2017 10/02/2016  Hearing? N N  Vision? Y N  Comment deciding on cataract surgery -  Difficulty concentrating or making decisions? Alyssa [name]  Walking or climbing stairs? Y Y  Comment - walks best on level surface, avoids  multiple steps , and uses scooters at larger stores   Dressing or bathing? N N  Doing errands, shopping? Alyssa [name]  Comment husband does the driving  husband does the driving   Conservation officer, nature and eating ? N N  Using the Toilet? N N  In the past six months, have you accidently leaked urine? N N  Do you have problems with loss of bowel control? N N  Managing your Medications? Alyssa [name] pharmacy following, now has compliance packaging  forgets to take at times  Managing your Finances? N N  Housekeeping or managing your Housekeeping? Alyssa [name]  Comment has hired Chartered certified accountant once weekly  -  Some recent data might be hidden    Fall/Depression Screening:    Fall Risk  11/25/2016 11/19/2016 10/24/2016  Falls in the past year? Yes Yes Yes  Number falls in past yr: 1 1 2  or more  Injury with Fall? No No No  Risk Factor Category  High Fall Risk - High Fall Risk  Risk for fall due to : History of fall(s) - History of fall(s)  Risk for fall due to: Comment - - -  Follow up Falls prevention discussed;Falls evaluation completed Falls evaluation completed Falls prevention discussed   PHQ 2/9 Scores 01/15/2017 11/25/2016 11/19/2016 08/19/2016 06/25/2016 05/08/2016 04/15/2016  PHQ - 2 Score 4 2 0 2 6 2 2   PHQ- 9 Score 7 - - 5 12 6 7   Exception Documentation - - - - - - -    Assessment:    Diabetes -  Patient reports consistently taking insulin at night. She has new smaller gauge insulin syringe for use. Patient demonstrates how to properly use glucose meter to  check blood sugar. She checked blood sugar during visit reading 287 and has eaten in last 1 hour.   Patient not consistently checking blood sugar, but continues to state she will get started, meter reviewed.   Patient will benefit from continued education and self care management of diabetes.  Alc has decreased to 8.8 on 10/2 from 13.3 on 3/18.  Next appointment with Dr. Katherine Basset, 04/15/17. Patient has received flu shot  Has cataracts has  cancelled surgery once this year , thinking about rescheduing in the next year   Medication  Patient taking medication from pill package labeled, week 2, week 1 package intact. Patient continues to keep current weekly package she is using on her table and remaining packages on designated cart. Has some difficulty missing morning medications.  Dental concerns  Has follow up dental visit in next month  regarding lose teeth  due to decay.    Has appointment with representative regarding signing up with insurance and hopes to add on dental coverage.   Patient would benefit from continued education and support of Diabetes, discussed telephonic disease management program and  and patient is agreeable.     Plan:  Will close to community care management , and will refer to telephone disease management.  Will send MD discipline closure letter.    Alyssa Draft, RN, [place] Management Coordinator  [phone]- Mobile [phone]- Toll Free Main Office

## 2017-02-06 ENCOUNTER — Encounter: Payer: Self-pay | Admitting: *Deleted

## 2017-02-09 ENCOUNTER — Other Ambulatory Visit: Payer: Self-pay | Admitting: Pharmacist

## 2017-02-09 NOTE — Patient Outreach (Signed)
[facility]) Care Management  02/09/2017  Alyssa [name] 1935-06-21 [id number]  Home visit with Alyssa [name] and her husband to review medication adherence.  HIPAA identifiers verified.   Subjective: Alyssa [name] reports she is doing "alright" with her medications at night but not with her morning medications.  She has a question about her statin causing neuropathy.  She reports her hands, feet, and her left leg are staying numb all day which started 3-4 months ago.   She reports she is giving herself her insulin 50 units QHS.  She also reports she takes her aspirin and Co-Q10 at night. She reports she is not good at checking her blood sugar because she doesn't like pricking her finger.  She reports she can do it and knows how to do it.    Objective:   CBG during visit: 169 (fasting)  Assessment: Per review of medication compliance pack, there are 4 missed morning doses of medications in the last week and 6 missed morning doses of medications the previous week.  I reviewed with Alyssa [name] that she can take her morning medications with her levothyroxine to avoid missing these doses as these medications do not interact with levothyroxine.  We reviewed avoiding iron or calcium containing medications at least 4 hours after the levothyroxine.  Alyssa [name] voiced understanding.  We reviewed indications for all her medications and made a goal of taking her morning medications at least 5 days per week.  Alyssa [name] agrees to this plan and thinks this is a reasonable goal.  I removed her previous 2 pill-packs with missed morning medications to avoid confusion.   Plan: I will route note to Dr. Vanetta Shawl regarding Alyssa [name]'s neuropathy.  I will call Alyssa [name] next week to follow-up on medication adherence.     Ralene Bathe, PharmD, [place] [phone]

## 2017-02-10 ENCOUNTER — Other Ambulatory Visit: Payer: Self-pay | Admitting: *Deleted

## 2017-02-10 ENCOUNTER — Encounter: Payer: Self-pay | Admitting: *Deleted

## 2017-02-10 NOTE — Patient Outreach (Signed)
Alyssa [name] [facility]) Care Management  02/10/2017  Alyssa [name] [birth year] [id number]   RN Health Coach Initial Assessment  Referral Date: 02/06/2017 Referral Source: From [place] Nurse Reason for Referral: Continued Disease Management Education Insurance: [employer]age   Outreach Attempt: Initial introductory telephone outreach to patient.  Patient request I call her back later today.   Plan:  RN Health Coach will re attempt introductory telephone outreach later today per patient request.   Alyssa Azure RN [place] [phone] [name]

## 2017-02-10 NOTE — Patient Outreach (Signed)
[place] [facility]) Care Management  [place]  02/10/2017   DECEMBER HEDTKE 20-Jul-1935 [id number]  [place] Initial Assessment   Referral Date: 02/06/2017 Referral Source: From [place] Nurse Reason for Referral: Continued Disease Management Education Insurance: [employer] Team Ad[employer]age   Outreach Attempt: Successful telephone outreach to patient.  HIPAA verified.  Patient completed initial telephone assessment.  Social: Patient lives at home with spouse.  States she is independent with her ADLs and currently needs little assistance with her IADLs.  She states she has a friend who comes to assist her and her husband with house cleaning at this point.  Patient states she ambulates independently and uses a cane sometimes when going up and down stairs.  Reports one fall this year without injury.  Patient states she does not follow a particular diet but tries to limit her meat intake.  Conditions:  Per chart review, PMH includes Type 2 diabetes, hypothyroidism, coronary artery disease, non-ST elevated myocardial infarction, hypertension, peripheral vascular disease, congestive heart failure, hyperlipidemia.  Patient reports having scale, CBG meter, and blood pressure machine in the home.  States she does not weigh daily, does not take her blood pressure, and only checks her blood sugar about once a week.  Reports fasting blood sugar was 169 yesterday when [facility] Pharmacist made home visit.  Patient reports she did not take her blood sugar this morning.  Weight 157 pounds today as I was speaking with patient.  Patient not able to verbalize her latest A1C of 8.8 on 01/13/2017.  Does agree to follow up with Dr. Vanetta Shawl in January for goal A1C.  Denies any recent highs or lows.  Patient also agrees to attempt to take her blood sugars at least 5 times a day and record readings to take to follow up appointment.  Patient is stating she is having numbness in her feet extending to her  ankles.  Patient encouraged to notify the physician of these problems.  Patient admits to being depressed.  Currently sees therapist in [place].  Reports last therapy session was a month ago.  Medications: Patient reports compliance with her nightly insulin injections.  Reports having her pills prepackaged and delivered from pharmacy.  Admits to forgetting to take her morning medications after she takes her synthroid and eats breakfast.  Can verbalize the instructions from [place] that she may take her morning medications at the same time as her synthroid avoiding "iron and calcium medications".  Has not taken her morning medications today as of yet.  Reports she will do so soon as she hangs up the telephone.  Denies any issues with affording medications or questions about medications.  Encounter Medications:  Outpatient Encounter Prescriptions as of 02/10/2017  Medication Sig Note  . aspirin 81 MG chewable tablet Take 81 mg by mouth daily.   [name] atorvastatin (LIPITOR) 80 MG tablet TAKE 1 TABLET BY MOUTH ONCE DAILY.   [name] azelastine (ASTELIN) 0.1 % nasal spray Place 2 sprays into both nostrils daily as needed for allergies.   . carvedilol (COREG) 3.125 MG tablet TAKE 1 TABLET BY MOUTH TWICE DAILY   . clopidogrel (PLAVIX) 75 MG tablet Take 1 tablet (75 mg total) by mouth daily.   . fluticasone (FLONASE) 50 MCG/ACT nasal spray Place 2 sprays into both nostrils daily. 01/01/2017: Taking PRN  . furosemide (LASIX) 20 MG tablet TAKE 1 TABLET BY MOUTH ONCE DAILY.   [name] ibuprofen (ADVIL,MOTRIN) 200 MG tablet Take 400 mg by mouth every  6 (six) hours as needed for mild pain.   [name] insulin detemir (LEVEMIR) 100 UNIT/ML injection Inject 50 Units into the skin at bedtime.    [name] levocetirizine (XYZAL) 5 MG tablet Take 5 mg by mouth at bedtime.  01/01/2017: Taking PRN  . levothyroxine (SYNTHROID, LEVOTHROID) 125 MCG tablet Take 125 mcg by mouth daily.   [name] loratadine (CLARITIN) 10 MG tablet Take 10 mg by mouth  daily.  01/01/2017: Taking PRN  . losartan (COZAAR) 50 MG tablet Take 1 tablet (50 mg total) by mouth daily.   . nitroGLYCERIN (NITROSTAT) 0.4 MG SL tablet Place 0.4 mg under the tongue every 5 (five) minutes as needed for chest pain. Reported on 10/04/2015 04/02/2014: .  . polyethylene glycol (MIRALAX / GLYCOLAX) packet Take 17 g by mouth daily as needed (constipation). Reported on 10/04/2015   . spironolactone (ALDACTONE) 50 MG tablet Take 25 mg by mouth daily.    Karen Chafe INSULIN SYRINGE 31G X 5/16" 0.5 ML MISC    . ZETIA 10 MG tablet TAKE 1 TABLET BY MOUTH EVERY DAY   . Coenzyme Q10 (COQ10 PO) Take 1 capsule by mouth daily. Reported on 10/11/2015   . hydrOXYzine (VISTARIL) 25 MG capsule 25 mg every 6 (six) hours as needed for itching.    . meclizine (ANTIVERT) 25 MG tablet Take 1 tablet (25 mg total) by mouth 3 (three) times daily as needed for dizziness. (Patient not taking: Reported on 12/18/2016)   . triamcinolone cream (KENALOG) 0.1 %     No facility-administered encounter medications on file as of 02/10/2017.     Functional Status:  In your present state of health, do you have any difficulty performing the following activities: 02/10/2017 01/15/2017  Hearing? N N  Vision? Y Y  Comment has cataract on right eye deciding on cataract surgery  Difficulty concentrating or making decisions? N Y  Walking or climbing stairs? N Y  Comment - -  Dressing or bathing? N N  Doing errands, shopping? N Y  Comment - husband does the Proofreader and eating ? N N  Using the Toilet? N N  In the past six months, have you accidently leaked urine? Y N  Do you have problems with loss of bowel control? N N  Managing your Medications? [place] following, now has compliance packaging   Managing your Finances? N N  Housekeeping or managing your Housekeeping? [name]  Comment friend assist with house cleaning has hired Chartered certified accountant once weekly   Some recent data might be hidden     Fall/Depression Screening: Fall Risk  02/10/2017 11/25/2016 11/19/2016  Falls in the past year? Yes Yes Yes  Number falls in past yr: 1 1 1   Injury with Fall? No No No  Risk Factor Category  - High Fall Risk -  Risk for fall due to : History of fall(s) History of fall(s) -  Risk for fall due to: Comment - - -  Follow up Education provided;Falls prevention discussed Falls prevention discussed;Falls evaluation completed Falls evaluation completed   PHQ 2/9 Scores 02/10/2017 01/15/2017 11/25/2016 11/19/2016 08/19/2016 06/25/2016 05/08/2016  PHQ - 2 Score 3 4 2  0 2 6 2   PHQ- 9 Score 5 7 - - 5 12 6   Exception Documentation - - - - - - -   [facility] CM Care Plan Problem One     Most Recent Value  Care Plan Problem One  Knowledge deficiet related to  diabetes self care management   Role Documenting the Problem One  [place] for Problem One  Active  [facility] Long Term Goal   Patient will report decreasing A1C to range between 6-7 in the next 90 days  [facility] Long Term Goal Start Date  11/25/16  Interventions for Problem One Long Term Goal  educated on the importance of medication compliance,  Educated on the importance of blood glucose monitoring, reinforced the need to monitor blood sugar more than once weekly, reinforced diabetic dietary options   [facility] CM Short Term Goal #1   Patient will report checking blood sugars at least 5 times a week and keeping log to take to primary appointments in the next 30 days  [facility] CM Short Term Goal #1 Start Date  01/15/17  Interventions for Short Term Goal #1  Educated on the importance of checking blood sugars, reinforced the need for the physician to know adequate blood sugar ranges, verbally educated on where to prick fingers for blood sugar checks to decrease pain during checks, Send EMMI on blood glucose monitoring   [facility] CM Short Term Goal #2   Patient will report taking her morning medications at least 5 times a week in the next 30 days.  [facility] CM Short Term Goal #2  Start Date  02/10/17  Interventions for Short Term Goal #2  Reinforced the importance of medication compliance, [facility] Pharmacist continue to follow up with patient for compliance, offered ideas to set alarms for morning medications, reinforced pharmacist education of taking morning medications with her morning synthroid      Appointments: Patient reports seeing her primary care provider on October 2.  Her next appointment is April 15, 2017.  Patient encouraged to keep this appointment and to call the physician with any questions or concerns.  Advanced Directives: Reports having a Living Will and [place] in place.  Does not want to make any changes at this time.   Consent: Patient gives verbal consent for continued [facility]  Services of RN Health Coach.   Plan:  Patient agrees to take blood sugars at least 5 times per week and keep log to review with primary care physician. Patient agrees to take morning medications at least 5 times a week. RN Health Coach to send patient EMMI on Blood glucose monitoring. RN Health Coach encouraged patient to continue with therapy sessions. RN Health Coach will make monthly telephone outreach in the month of November.  [place] [phone] [email or web address] [email or web address]

## 2017-02-16 ENCOUNTER — Ambulatory Visit: Payer: Self-pay | Admitting: Pharmacist

## 2017-02-16 ENCOUNTER — Other Ambulatory Visit: Payer: Self-pay | Admitting: Pharmacist

## 2017-02-16 NOTE — Patient Outreach (Signed)
[place] [facility]) Care Management  02/16/2017  DANILYNN JEMISON 10/22/1935 [id number]  11:24AM Successful call to Mrs. Watkinson today.  Patient prefers that I call back at a later time as she is still waking up.  I will call patient this afternoon.   4:10PM Successful call to Mrs. Humbarger this afternoon.  HIPAA identifiers verified.  Patient reports she is taking her medications from her compliance packs.  She reports that she ran out of insulin on Friday, however she was able to have a new prescription called into [facility] pharmacy and picked it up on Sunday.  She reports that she missed 2 morning doses and 1 evening dose of oral medications over the last 7 days.    I reviewed with Mrs. Lykins that she met her goal of taking her morning medications 5 of 7 days of the week.  I encouraged her to continue to take her medications around the time of her levothyroxine in the morning.    Plan: I will follow-up with patient next Friday to assess medication adherence.   Ralene Bathe, PharmD, [place] [phone]

## 2017-02-26 ENCOUNTER — Ambulatory Visit: Payer: PPO | Admitting: Podiatry

## 2017-03-02 ENCOUNTER — Ambulatory Visit: Payer: PPO | Admitting: Podiatry

## 2017-03-09 ENCOUNTER — Other Ambulatory Visit: Payer: Self-pay | Admitting: *Deleted

## 2017-03-09 NOTE — Patient Outreach (Signed)
[facility]) Care Management  03/09/2017  MILLETTE HALBERSTAM March 04, 1936 [id number]   RN Health Coach Monthly Outreach  Referral Date: 02/06/2017 Referral Source:  [place] referral Reason for Referral:  Continued Disease Management Education Insurance:  Health [employer] Ad[employer]age   Outreach Attempt: Unsuccessful telephone outreach to patient.  Patient stated she was eating and requested I give her a call back.  Plan:  RN Health Coach will attempt another telephone outreach in the month of November.   [place] [phone] [email or web address]

## 2017-03-10 ENCOUNTER — Other Ambulatory Visit: Payer: Self-pay | Admitting: *Deleted

## 2017-03-10 ENCOUNTER — Encounter: Payer: Self-pay | Admitting: *Deleted

## 2017-03-10 NOTE — Patient Outreach (Signed)
[place] [facility]) Care Management  03/10/2017  Alyssa [name] 02-01-1936 [id number]  RN Health Coach Monthly Outreach  Referral Date: 02/06/2017 Referral Source:  [place] referral Reason for Referral:  Continued Disease Management Education Insurance:  [employer] Ad[employer]age  Outreach Attempt:  Successful telephone outreach to patient.  HIPAA verified.  Patient stating she has been doing well.  Reports no sick days since we last spoke.  Patient stating she has taking her morning medications 6 out of the last 7 days.  Reports she has not taken her morning medications, as of yet today.  Reports still waiting 1-2 hours after taking her thyroid medication to take her other medications and at times still forgetting.  Patient again educated and encouraged to take her medications in the mornings with her other medication.  Reports only checking her blood sugars about twice a week.  States the last fasting CBG was 155.  Patient reports being more tired than usual, stating she has been napping a lot in the afternoons.  Patient encouraged to check and record her blood sugars more frequently while on insulin injections.  Appointments: Patient has appointment with her Primary Care Provider, Dr. Vanetta Shawl on April 15, 2017.  Patient encouraged to keep this appointment and discuss medication times with physician.  Patient also encouraged to discuss her increase in tiredness with her physician.  Plan:  RN Health Coach to make monthly telephone outreach to patient in the month of December.   [place] [phone] [email or web address]

## 2017-03-17 ENCOUNTER — Telehealth: Payer: Self-pay | Admitting: Pharmacist

## 2017-03-17 NOTE — Patient Outreach (Signed)
[place] [facility]) Care Management  03/17/2017  BREAN CARBERRY [birth year] [id number]   Unsuccessful telephone call to Ms. Petkus today.  I left a HIPPA compliant voicemail on the home phone.    Plan: I will follow-up with Ms. Schuermann later this week regarding medication adherence.   Ralene Bathe, PharmD, [place] [phone]

## 2017-03-19 ENCOUNTER — Ambulatory Visit: Payer: Self-pay | Admitting: Pharmacist

## 2017-03-19 ENCOUNTER — Telehealth: Payer: Self-pay | Admitting: Pharmacist

## 2017-03-19 NOTE — Patient Outreach (Signed)
[place] [facility]) Care Management  03/19/2017  Alyssa [name] [birth year] [id number]   Subjective:  Successful telephone call with Alyssa [name].  HIPAA identifiers verified. Alyssa [name] reports she is doing "pretty well" with her medications.  She reports she usually remembers to take her morning medications and is now taking them ~30 minutes after her levothyroxine.  She reports problems remembering her evening medications now however.  She reports she is concerned about her cholesterol medication causing her hands to feel numb.  She reports that the cholesterol medication is the only pill she is supposed to take at night.  She reports she [facility] and that [facility] rather than [employer].  She reports [employer] delivered 2 months supply of compliance packs.  She reports she has been busy trying to have her teeth "fixed" and was told all her teeth needed to be removed. She reports she has been meaning to call to set up an appointment with her PCP.   A/P:  Medication adherence: Alyssa [name] has improved her compliance with her morning medications but has stopped taking her evening medications due to concern regarding peripheral neuropathy possibly caused by atorvastatin. By skipping her evening compliance pack dose, she has also been missing her blood pressure pill, carvedilol.    We reviewed adverse side effects of statin medications.  Peripheral neuropathy is listed as <1% incidence in atorvastatin package insert.  I encouraged Alyssa [name] to discuss her peripheral neuropathy with her PCP and to ask for treatment recommendations about atorvastatin before stopping it on her own.  Alyssa [name] voiced understanding.    We reviewed twice daily dosing of carvedilol and how it is included in her evening compliance pack.  Alyssa [name] reviewed her compliance pack during our phone conversation and voiced understanding.  She will work  on remembering to take her evening medications.     I congratulated Alyssa [name] on continuing to use her compliance packs and her improved medication adherence.  She reports she will call to make an appointment with her PCP tomorrow.   Plan: I will follow-up with Alyssa [name] in the next 2-3 weeks regarding her atorvastatin and PCP recommendations and compliance with evening medications.   I will route my note to PCP to update them on peripheral neuropathy.   Ralene Bathe, PharmD, [place] [phone]

## 2017-03-26 DIAGNOSIS — K089 Disorder of teeth and supporting structures, unspecified: Secondary | ICD-10-CM | POA: Diagnosis not present

## 2017-03-26 DIAGNOSIS — I739 Peripheral vascular disease, unspecified: Secondary | ICD-10-CM | POA: Diagnosis not present

## 2017-03-26 DIAGNOSIS — I10 Essential (primary) hypertension: Secondary | ICD-10-CM | POA: Diagnosis not present

## 2017-03-26 DIAGNOSIS — E1139 Type 2 diabetes mellitus with other diabetic ophthalmic complication: Secondary | ICD-10-CM | POA: Diagnosis not present

## 2017-03-26 DIAGNOSIS — I2510 Atherosclerotic heart disease of native coronary artery without angina pectoris: Secondary | ICD-10-CM | POA: Diagnosis not present

## 2017-03-26 DIAGNOSIS — Z6828 Body mass index (BMI) 28.0-28.9, adult: Secondary | ICD-10-CM | POA: Diagnosis not present

## 2017-03-26 DIAGNOSIS — Z01818 Encounter for other preprocedural examination: Secondary | ICD-10-CM | POA: Diagnosis not present

## 2017-03-30 ENCOUNTER — Encounter: Payer: Self-pay | Admitting: *Deleted

## 2017-03-30 ENCOUNTER — Other Ambulatory Visit: Payer: Self-pay | Admitting: *Deleted

## 2017-03-30 NOTE — Patient Outreach (Addendum)
[place] [facility]) Care Management  03/30/2017  KYARAH ENAMORADO 03/07/1936 [id number]  [place] Monthly Outreach  Referral Date:02/06/2017 Referral Source:[facility] referral Reason for Referral:Continued Disease Management Education Insurance:Health Team Advantage  Outreach Attempt:  Successful telephone outreach to patient for monthly follow up.  HIPAA verified.  Patient is stating she is doing well.  Reports compliance with her morning medications 6 out of 7 days a week.  States she has seen her primary care provider last week and discussed possible neuropathy pain from statins.  Patient states physician has requested she continue her current statin medication and patient states since she has taking the medication on a nightly basis.  Patient unsure of last Hgb A1C and reports monitoring her blood sugars about twice a week.  States physician has requested she monitor her blood sugars at least daily and to record the readings to bring into office during appointments.  Patient stating she has trouble remembering to take her blood sugars.  Encouraged patient to monitor blood sugars daily and if needed seek the assistance of her husband or family.  Patient also unsure of her fasting blood sugar ranges when she does monitor her blood sugars.  Reports continuing to have dental extractions completed.  Appointments:  Patient reports seeing her primary care provider, Dr. Vanetta Shawl on 03/26/2017 at the request of her Dentist.  Reports her future appointment was scheduled for April 15, 2017.  Patient states she sees the Dentist on 03/31/2017 for continued dental extractions and denture evaluation.  Plan:  RN Health Coach will make next monthly outreach to patient in the month of January.  RN Health Coach to send patient 2019 Calendar for blood glucose monitoring and medication compliance and documentation.   [place] [phone] Linley [email or web address]

## 2017-04-13 ENCOUNTER — Other Ambulatory Visit: Payer: Self-pay | Admitting: Pharmacist

## 2017-04-13 ENCOUNTER — Ambulatory Visit: Payer: Self-pay | Admitting: Pharmacist

## 2017-04-13 NOTE — Patient Outreach (Signed)
[place] [name]) Care Management  04/13/2017  Alyssa [name] [birth year] [id number]   Successful call placed to [name] regarding medication adherence.  HIPAA identifiers verified.    Alyssa [name] reports she has not seen her PCP yet because she has been preoccupied by dental issues.  She complains of feeling numb from her "knees on down" as well as in her hands.  She also reports that her thyroid medication pills look different, and she feels sleepy and tired like she did before she was started on levothyroxine.  She thinks she is using [facility] than [name].   Patient reports she is doing well with her compliance packs and has not missed a dose in 2-3 weeks.  She states she is taking her evening medications consistently.    Care coordination call placed to Alyssa [name].   Per pharmacist, patient does not have refills left on levothyroxine.  Refill request was faxed to Alyssa [name] but no reply received.  Last filled 02/24/2017 for 28 day supply.  Once authorization is sent from PCP, pharmacy can deliver to patient this week.    Care coordination call placed to Alyssa [name].  Per records, levothyroxine refills authorized in October x 5 and sent to Alyssa [name].  Office staff will call Alyssa [name] again today to re-authorize refills.    Plan: I will follow-up with Alyssa [name] later this week to ensure she has received correct brand of levothyroxine.    Ralene Bathe, PharmD, [place] [phone]

## 2017-04-16 ENCOUNTER — Ambulatory Visit: Payer: Self-pay | Admitting: Pharmacist

## 2017-04-16 ENCOUNTER — Other Ambulatory Visit: Payer: Self-pay | Admitting: Pharmacy Technician

## 2017-04-16 ENCOUNTER — Other Ambulatory Visit: Payer: Self-pay | Admitting: Pharmacist

## 2017-04-16 NOTE — Patient Outreach (Signed)
Alyssa [name]) Care Management  04/16/2017  Alyssa [name] 1935/08/23 [id number]   Successful telephone call placed to Alyssa [name] today to ensure she has the correct levothyroxine medication. HIPAA identifiers verified.   Alyssa [name] reports she picked up her levothyroxine prescription with correct manufacturer and dose yesterday from [facility].  She reports she had had 2 doses and is starting to feel a small improvement in symptoms however still endorses low energy.  She does state she has a cold however which could be contributing to feeling tired.  She has an appointment today with Dr. Vanetta Shawl at 4:30.  I encouraged her to review her neuropathy symptoms with her provider as discussed during previous conversations with patient.    Alyssa [name] has been receiving Zetia (ezetimibe) from [employer] Patient Assistance Program through 2018.  She reports she still has a few pills left.  She is agreeable to re-apply for program in 2019. Zetia is prescribed by her cardiologist, Dr. Radford Pax.  Alyssa [name] reports she received her 2019 social security statement listing her new monthly income however could not find these documents during our conversation.  She will look for them and call me when she finds out her gross annual income.    Plan: I will route patient assistance letter to pharmacy technician, Alyssa [name], who will coordinate application process for Zetia through [employer] PAP.  She will assist with obtaining all pertinent documents from both patient and Dr. Fransico Him and submit application once completed.    Alyssa [name], PharmD, [place] [phone]

## 2017-04-16 NOTE — Patient Outreach (Signed)
[place] [facility]) Care Management  04/16/2017  Alyssa [name] 1935-09-16 [id number]  Received Patient Assistance referral from [facility] (Pharmacist) to help patient with [employer] application for [employer]. Mailed patient portion and provider portion out today.  Will follow up with patient and provider in the next 3 days to inform them of applications being mailed.  [name] [place], [place] Management [phone]

## 2017-04-20 ENCOUNTER — Other Ambulatory Visit: Payer: Self-pay | Admitting: Pharmacy Technician

## 2017-04-20 NOTE — Patient Outreach (Signed)
[place] [facility]) Care Management  04/20/2017  Alyssa [name] 19-Feb-1936 [id number]   Successful outreach call to [name]. HIPAA identifiers verified. Contacted patient to inform that I mailed [employer]ck application (Zetia) out 04/17/17. I requested that patient contact me when she mails it back into me. Will follow up with patient in 10 days if I have not been contacted by her.  [name] [place], [place] Management [phone]

## 2017-04-22 ENCOUNTER — Telehealth: Payer: Self-pay

## 2017-04-22 DIAGNOSIS — Z6829 Body mass index (BMI) 29.0-29.9, adult: Secondary | ICD-10-CM | POA: Diagnosis not present

## 2017-04-22 DIAGNOSIS — J208 Acute bronchitis due to other specified organisms: Secondary | ICD-10-CM | POA: Diagnosis not present

## 2017-04-22 DIAGNOSIS — E1139 Type 2 diabetes mellitus with other diabetic ophthalmic complication: Secondary | ICD-10-CM | POA: Diagnosis not present

## 2017-04-22 NOTE — Telephone Encounter (Signed)
I received an urgent request [employer] Pt assistance application from [employer] with [facility]. I have completed the form and Dr Radford Pax has signed. I have placed the application in the envelope that was provided addressed to [place] at [facility] and placed in the mail as requested.

## 2017-04-28 ENCOUNTER — Other Ambulatory Visit: Payer: Self-pay | Admitting: *Deleted

## 2017-04-28 ENCOUNTER — Encounter: Payer: Self-pay | Admitting: *Deleted

## 2017-04-28 NOTE — Patient Outreach (Addendum)
Alyssa [name]) Care Management  [place]  04/28/2017   Alyssa [name] 1935-05-24 [id number]   RN Health Coach Monthly Outreach   Referral Date: 02/06/2017 Referral Source:  [place] referral Reason for Referral:  Continued Disease Management Education Insurance:  Health Team Advantage   Outreach Attempt:  Successful telephone outreach to patient for monthly follow up.  HIPAA confirmed.  Patient stating she is not feeling well.  States she has had viral bronchitis for the last 4 weeks and doesn't feel like she is getting any better.  Patient also states she is still feeling tired and weak.  Reports she has been taking her synthroid since receiving the medication a few weeks ago, but feels some of her tiredness is coming from her being sick for the last 4 weeks.  Patient states she has missed several doses of her morning medications in the last week.  States her CBG meter has not worked in the last week and she is unsure of why.  RN Health Coach verbally coached patient on how to change the battery in her meter.  Patient able to locate the battery, does not have a new one available.  Encouraged patient to purchase a new battery and place in the meter to see if this will fix the meter.  Encouraged patient to take meter to next medical appointment or a pharmacy to ask for assistance trouble shooting meter if changing the battery did not fix the meter.  Patient reported when meter was working she was not taking and recording blood sugars daily.  Stating she just hasn't felt well in the last 4 weeks.  Reports she is still in the process of getting dental extractions.  Patient states she has not received Facilities manager mailed by [employer].  Encouraged patient to check her mail to verify if she has paperwork.  If she does to complete the application, return and notify [place] when application placed in the mail.  Instructed patient if she did not  receive the application to let [place] know or this RN Health Coach.  Encounter Medications:  Outpatient Encounter Medications as of 04/28/2017  Medication Sig Note  . amitriptyline (ELAVIL) 10 MG tablet Take 10 mg by mouth.   [name] aspirin 81 MG chewable tablet Take 81 mg by mouth daily.   [name] atorvastatin (LIPITOR) 80 MG tablet TAKE 1 TABLET BY MOUTH ONCE DAILY.   [name] azelastine (ASTELIN) 0.1 % nasal spray Place 2 sprays into both nostrils daily as needed for allergies.   . carvedilol (COREG) 3.125 MG tablet TAKE 1 TABLET BY MOUTH TWICE DAILY   . carvedilol (COREG) 3.125 MG tablet Take 3.125 mg by mouth.   . clopidogrel (PLAVIX) 75 MG tablet Take 1 tablet (75 mg total) by mouth daily.   . Coenzyme Q10 (COQ10 PO) Take 1 capsule by mouth daily. Reported on 10/11/2015   . escitalopram (LEXAPRO) 10 MG tablet    . fluticasone (FLONASE) 50 MCG/ACT nasal spray Place 2 sprays into both nostrils daily. 01/01/2017: Taking PRN  . furosemide (LASIX) 20 MG tablet TAKE 1 TABLET BY MOUTH ONCE DAILY.   [name] gabapentin (NEURONTIN) 600 MG tablet Take 600 mg by mouth.   [name] glimepiride (AMARYL) 4 MG tablet Take 4 mg by mouth.   . hydrOXYzine (VISTARIL) 25 MG capsule 25 mg every 6 (six) hours as needed for itching.    [name] ibuprofen (ADVIL,MOTRIN) 200 MG tablet Take 400 mg by mouth  every 6 (six) hours as needed for mild pain.   [name] insulin detemir (LEVEMIR) 100 UNIT/ML injection Inject 50 Units into the skin at bedtime.    [name] levocetirizine (XYZAL) 5 MG tablet Take 5 mg by mouth at bedtime.  01/01/2017: Taking PRN  . levothyroxine (SYNTHROID, LEVOTHROID) 125 MCG tablet Take 125 mcg by mouth daily.   [name] loratadine (CLARITIN) 10 MG tablet Take 10 mg by mouth daily.  01/01/2017: Taking PRN  . losartan (COZAAR) 50 MG tablet Take 1 tablet (50 mg total) by mouth daily.   . meclizine (ANTIVERT) 25 MG tablet Take 1 tablet (25 mg total) by mouth 3 (three) times daily as needed for dizziness. (Patient not taking: Reported on  12/18/2016)   . nitroGLYCERIN (NITROSTAT) 0.4 MG SL tablet Place 0.4 mg under the tongue every 5 (five) minutes as needed for chest pain. Reported on 10/04/2015 04/02/2014: .  . pantoprazole (PROTONIX) 40 MG tablet Take 40 mg by mouth.   . polyethylene glycol (MIRALAX / GLYCOLAX) packet Take 17 g by mouth daily as needed (constipation). Reported on 10/04/2015   . spironolactone (ALDACTONE) 50 MG tablet Take 25 mg by mouth daily.    . ticagrelor (BRILINTA) 90 MG TABS tablet Take 90 mg by mouth.   . triamcinolone cream (KENALOG) 0.1 %    . TRUEPLUS INSULIN SYRINGE 31G X 5/16" 0.5 ML MISC    . ZETIA 10 MG tablet TAKE 1 TABLET BY MOUTH EVERY DAY    No facility-administered encounter medications on file as of 04/28/2017.     Functional Status:  In your present state of health, do you have any difficulty performing the following activities: 02/10/2017 01/15/2017  Hearing? N N  Vision? Y Y  Comment has cataract on right eye deciding on cataract surgery  Difficulty concentrating or making decisions? N Y  Walking or climbing stairs? N Y  Comment - -  Dressing or bathing? N N  Doing errands, shopping? N Y  Comment - husband does the Proofreader and eating ? N N  Using the Toilet? N N  In the past six months, have you accidently leaked urine? Y N  Do you have problems with loss of bowel control? N N  Managing your Medications? [place] following, now has compliance packaging   Managing your Finances? N N  Housekeeping or managing your Housekeeping? [name]  Comment friend assist with house cleaning has hired Chartered certified accountant once weekly   Some recent data might be hidden    Fall/Depression Screening: Fall Risk  04/28/2017 02/10/2017 11/25/2016  Falls in the past year? Yes Yes Yes  Comment no falls in the past month per patient - -  Number falls in past yr: 1 1 1   Injury with Fall? No No No  Risk Factor Category  - - High Fall Risk  Risk for fall due to : History of  fall(s);Medication side effect History of fall(s) History of fall(s)  Risk for fall due to: Comment - - -  Follow up Education provided;Falls prevention discussed Education provided;Falls prevention discussed Falls prevention discussed;Falls evaluation completed   PHQ 2/9 Scores 02/10/2017 01/15/2017 11/25/2016 11/19/2016 08/19/2016 06/25/2016 05/08/2016  PHQ - 2 Score 3 4 2  0 2 6 2   PHQ- 9 Score 5 7 - - 5 12 6   Exception Documentation - - - - - - -    [facility] CM Care Plan Problem One     Most Recent  Value  Care Plan Problem One  Knowledge deficiet related to diabetes self care management   Role Documenting the Problem One  [place] for Problem One  Active  [facility] Long Term Goal   Patient will report decreasing A1C to range between 6-7 in the next 90 days  [facility] Long Term Goal Start Date  03/10/17  Interventions for Problem One Long Term Goal  educated on the importance of medication compliance,  Educated on the importance of blood glucose monitoring, reinforced the need to monitor blood sugar more than once weekly, reinforced diabetic dietary options, Reviewed and discussed current care plan with patient, discussed signs and symptoms of hypoglycemia and hyperglycemia,  encouraged patient to keep and attend medical appointments  [facility] CM Short Term Goal #1   Patient will report checking blood sugars at least 5 times a week and keeping log to take to primary appointments in the next 30 days  [facility]N CM Short Term Goal #1 Start Date  04/28/17  Interventions for Short Term Goal #1  Educated on the importance of checking blood sugars, reinforced the need for the physician to know adequate blood sugar ranges, verbally educated on where to prick fingers for blood sugar checks to decrease pain during checks, discussed signs and symptoms of hypoglycemia and hyperglycemia, discussed with patient need to monitor blood sugars while on insulin,  encouraged patient to utilize Calendar Booklet sent to patient to log  cbg readings and medication compliance,  verbally instructed patient on how to change the battery in her CBG meter  [facility] CM Short Term Goal #2   Patient will report taking her morning medications at least 7 times a week before 2pm in the next 30 days.  [facility] CM Short Term Goal #2 Start Date  04/28/17  Interventions for Short Term Goal #2  encouraged medication compliance, discussed timeliness of taking morning medicaitons.,  discussed importance of taking statin medication, discussed importance of discussing with physician prior to stopping any medication,  encouraged patient to log when compliant with taking medications  [facility] CM Short Term Goal #3  Patient will see primary care provider within the next 30 days.  [facility] CM Short Term Goal #3 Start Date  04/28/17  Interventions for Short Tern Goal #3  Encouraged patient to call primary care provider to schedule an appointment as soon as possible, encouraged patient to keep and attend scheduled medical appointments,        Appointments: Patient reports missing her appointment on 04/15/2017 with Dr. Vanetta Shawl.  She states she did see a nurse practitioner in his office last week, maybe 04/20/2017 for her cough and cold symptoms.  States she was given medications that does not seem to be working.  Encouraged patient to contact her primary care provider's office as soon as possible for follow up appointment as soon as possible with continued symptoms.  Also, encouraged patient to take CBG meter to appointment to have staff assist her with trouble shooting meter if changing the battery did not make meter work.  Patient stating she cancelled her dental appointment for 05/01/2017 due to her illness also.  Encouraged patient to reschedule the dental appointment as soon as she could.  Plan:   RN Health Coach will attempt another monthly outreach to patient in the month of February. RN Health Coach will route primary MD quarterly update.  [place] [phone] [email or web address]

## 2017-04-29 ENCOUNTER — Other Ambulatory Visit: Payer: Self-pay | Admitting: Pharmacy Technician

## 2017-04-29 NOTE — Patient Outreach (Signed)
[place] [facility]) Care Management  04/29/2017  MIA MILAN 07-24-1935 [id number]  Successful Outreach call to Mrs. Vivier. Patient has not received [employer]ck (Zetia) patient application as of yet. She stated that she would have her husband check the mail for it.   Patient stated she is having trouble checking her blood sugar. She stated a nurse (she thinks) came in and left a True Metrix meter and took her 2 she already had. Patient has replaced battery in the new meter but is still having trouble getting it to work. Will send this to Pharmacist Ralene Bathe for her to consult patient on how to use or how to replace.  [name] [place], [place] Management [phone]

## 2017-04-30 ENCOUNTER — Ambulatory Visit: Payer: Self-pay | Admitting: Pharmacist

## 2017-04-30 ENCOUNTER — Ambulatory Visit: Payer: Self-pay | Admitting: Pharmacy Technician

## 2017-04-30 ENCOUNTER — Other Ambulatory Visit: Payer: Self-pay | Admitting: Pharmacist

## 2017-04-30 DIAGNOSIS — Z6829 Body mass index (BMI) 29.0-29.9, adult: Secondary | ICD-10-CM | POA: Diagnosis not present

## 2017-04-30 DIAGNOSIS — R079 Chest pain, unspecified: Secondary | ICD-10-CM | POA: Diagnosis not present

## 2017-04-30 DIAGNOSIS — B9689 Other specified bacterial agents as the cause of diseases classified elsewhere: Secondary | ICD-10-CM | POA: Diagnosis not present

## 2017-04-30 DIAGNOSIS — J208 Acute bronchitis due to other specified organisms: Secondary | ICD-10-CM | POA: Diagnosis not present

## 2017-04-30 DIAGNOSIS — R05 Cough: Secondary | ICD-10-CM | POA: Diagnosis not present

## 2017-04-30 NOTE — Patient Outreach (Signed)
[place] [facility]) Care Management  04/30/2017  Alyssa [name] 1935-05-26 [id number]  Communication from [place], Etter Sjogren, regarding Alyssa [name].  Alyssa [name] reported that her glucometer was not functioning correctly and that she had not received the Merck application mailed from [facility] 2 weeks ago.    Unsuccessful telephone call to Alyssa [name] today to follow up on glucometer and Zetia PAP application.  No voicemail available to leave message.   Plan: I will try to reach Alyssa [name] telephonically tomorrow.   Ralene Bathe, PharmD, [place] [phone]

## 2017-05-01 ENCOUNTER — Other Ambulatory Visit: Payer: Self-pay | Admitting: *Deleted

## 2017-05-01 ENCOUNTER — Other Ambulatory Visit: Payer: Self-pay | Admitting: Pharmacy Technician

## 2017-05-01 ENCOUNTER — Other Ambulatory Visit: Payer: Self-pay | Admitting: Pharmacist

## 2017-05-01 ENCOUNTER — Ambulatory Visit: Payer: Self-pay | Admitting: Pharmacist

## 2017-05-01 NOTE — Patient Outreach (Signed)
[place] [facility]) Care Management  05/01/2017  ANABELLE BUNGERT 02-28-1936 [id number]   Incoming call   Received telephone voicemail message from [name], .identified self as husband of Keatyn Luck, message states patient having difficulty with checking blood sugar due to not having a working meter, he reports 2 people came in recently and took the old meters out that she had and can't get new meter working.   Will follow with  phone call to  Lenis Dickinson , [facility] Pharmacist  following patient.    [name], RN, [place] Management Coordinator  [phone]- Mobile [phone]- Toll Free Main Office

## 2017-05-01 NOTE — Patient Outreach (Signed)
[place] [facility]) Care Management  05/01/2017  Alyssa [name] December 09, 1935 [id number]   Received provider portion of [employer] (Zetia) application.  [name], [place] Management [phone]

## 2017-05-01 NOTE — Patient Outreach (Signed)
Alyssa [name] [facility]) Care Management  05/01/2017  Alyssa [name] Apr 30, 1935 [id number]  Successful call with Ms. Alyssa [name] today.  HIPAA identifiers verified.   Ms. Alyssa [name] reports that she thinks she has bronchitis and went to see her doctor yesterday regarding a cough lasting at least 4 weeks.  She reports that she had a chest xray done yesterday but was told it was normal.  She reports she was called in a prescription to help with her cough but that she does not think it is making any difference and does not remember the name of the medicine.    She reports that 2 people, maybe from her insurance, came to her house and replaced her working glucometers with a True Track meter which does not work.  She reports she brought it to her provider office but they did not help her.  She replaced the battery but this did not work either.  She thinks it has not been working for 1-2 weeks.  She reports she is still using her insulin but was told by her doctor that she probably needs to increase her insulin dose but they cannot do this until she is able to check her blood sugars.    She reports she has not received the [employer] application for Zetia which was mailed to her in early January.  She believes she knows where her financial documents are in order to fill out the application.  Ms. Alyssa [name] reports that she is taking her medications from the compliance pack and has not missed any doses in the last several weeks.    She reports that she has several weeks worth of compliance packs and is not running low on any of her medications.    Plan: I advised Ms. Alyssa [name] to take her glucometer to [facility] to ask a pharmacist to help look at her new meter. I will follow-up with Ms. Alyssa [name] on Monday.  If the pharmacy cannot fix the glucometer, I will quest a new prescription be sent to [facility] for a new meter (One Touch, Free Style, or Precision are preferred through [facility]).    I will schedule a home  visit with Ms. Alyssa [name] next Wednesday to deliver another Merck application.    Ralene Bathe, PharmD, [place] [phone]

## 2017-05-04 ENCOUNTER — Ambulatory Visit: Payer: Self-pay | Admitting: Pharmacist

## 2017-05-04 ENCOUNTER — Other Ambulatory Visit: Payer: Self-pay | Admitting: Pharmacist

## 2017-05-04 DIAGNOSIS — J189 Pneumonia, unspecified organism: Secondary | ICD-10-CM | POA: Diagnosis not present

## 2017-05-04 NOTE — Patient Outreach (Addendum)
[place] [facility]) Care Management  05/04/2017  Alyssa [name] 12-27-1935 [id number]  Successful telephone call to Alyssa [name] today.  HIPAA identifiers verified.  Subjective: Patient reports she is still "coughing and coughing and coughing. I can't seem to get rid of this."   She states "I thnk I may have dementia as I'm forgetting things now."  Alyssa [name] reports that she found the manual for the true track glucometer, and it is now working properly.  She reports that she is able to check her blood sugars.  She reports her readings the last 3 nights have been 311, 161, and 248.  She checks her readings at night and administers her insulin before bed.   She wants to know if she will need to give herself 2 shots if her dose increases as her insulin syringes currently are 0.5cc and the max units is 50 which is her current dose.   Assessment:  Diabetes: Not at goal per CBG readings and last hemoglobin A1C 8.8 (10/'18).  Patient is able to check her blood sugars again as glucometer now working after reading manual.  Patient due for new A1C reading and will call to make a f/u appointment with her provider. We reviewed that she can get a different insulin syringe size that can accommodate higher insulin doses if needed.  We reviewed importance of not skipping meals and taking insulin on a consistent schedule.    Coughing: Per review of notes, CXR on 04/30/17 reads: new 42mm nodular density in RUL, possibly infectious or inflammatory.  Acute bacterial bronchitis.  Patient now reports that no one has called her about the results and no antibiotics have been started yet.  She states she will call her primary care provider, Dr. Vanetta Shawl, after our phone call to request an update and to schedule a follow-up appointment.  I will also follow-up with provider office to ensure appropriate follow-up for patient.   Medication assistance: Patient unable to locate Merck PAP application mailed to her earlier  this month.  I will deliver one to her this week to expedite process.   Plan: I will call Dr. Vanetta Shawl regarding CXR results and need for antibiotics.  Home visit scheduled with Alyssa [name] on Wednesday, January 23, at 2:00 PM  Ralene Bathe, PharmD, [place] [phone]    Addendum: Spoke with Alyssa [name] with [facility].  Azithromycin called into [facility] [employer] on 05/03/2017.  Representative will call patient today from office to review CXR.     Spoke with [employer]. Antibiotic mailed to patient.  Pharmacist will call patient to let her her know estimated delivery.   Ralene Bathe, PharmD, [place] [phone]

## 2017-05-06 ENCOUNTER — Other Ambulatory Visit: Payer: Self-pay | Admitting: Pharmacist

## 2017-05-06 ENCOUNTER — Ambulatory Visit: Payer: Self-pay | Admitting: Pharmacist

## 2017-05-06 NOTE — Patient Outreach (Addendum)
[place] [facility]) Care Management  05/06/2017  SAYLOR MURRY [birth year] [id number]  Successful home visit with Ms. 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   Ms. Machi reports that she was told by her provider that she may have "pneumonia."  She reports she received "2 shots" on Monday from [facility] and started an oral antibiotic, azithromycin, on Tuesday.  She reports that she might be feeling a little bit better.   I advised her to contact her provider if she has not had any further improvement by tomorrow.    Ms. Salem reports she found the mailed Merck application from [facility] yesterday but has not filled it out yet.  I assisted her with completing the application.  She was able to find social security documents to verify income (spouse receives SSI $1,314.50 + pension $360, patient receives SSI $775.50).  We will mail this to [employer].    Patient reports she is not missing any doses.  She had not yet taken today's morning dose.  I assisted her with taking the medications during our visit.    Patient reports her glucometer is working now that she has her manual.    I reviewed patient's medications and found several discrepancies.  It appears that previously discontinued medications were inadvertently added to patient's list from [facility].  I removed medications that patient is no longer taking, including duplicate carvedilol, ticagrelor, amitriptyline, gabapentin, glimepiride, pantoprazole and triamcinolone cream.   Plan: [facility] pharmacy technician will follow-up with Ms. Valone regarding patient assistance in 2-4 weeks.  I will follow-up with Ms. Criswell for medication adherence within the next month.     Ralene Bathe, PharmD, [place] [phone]

## 2017-05-07 ENCOUNTER — Other Ambulatory Visit: Payer: Self-pay | Admitting: Pharmacy Technician

## 2017-05-07 NOTE — Patient Outreach (Signed)
[place] [facility]) Care Management  05/07/2017  Alyssa [name] 1936/03/16 [id number]   Mailed out completed patient assistance application to [employer].  [name], [place] Management [phone]

## 2017-05-19 ENCOUNTER — Other Ambulatory Visit: Payer: Self-pay | Admitting: Cardiology

## 2017-05-27 ENCOUNTER — Other Ambulatory Visit: Payer: Self-pay | Admitting: Pharmacy Technician

## 2017-05-27 ENCOUNTER — Encounter (HOSPITAL_COMMUNITY): Payer: Self-pay

## 2017-05-27 ENCOUNTER — Ambulatory Visit: Payer: Self-pay | Admitting: Family

## 2017-05-27 NOTE — Patient Outreach (Addendum)
[place] [facility]) Care Management  05/27/2017  CHELLA CHAPDELAINE [birth year] [id number]   Successful Outreach call to [employer] Patient assistance. Spoke to Shanon Brow whom confirmed attestation letter was mailed out to patient on 05/23/2017 for her to sign and return.  [name] [place], [place] Management [phone]

## 2017-05-27 NOTE — Patient Outreach (Signed)
[place] [facility]) Care Management  05/27/2017  Alyssa [name] 1935-12-04 [id number]   Successful Outreach call to [name] in reference to [employer] patient assistance. HIPAA identifiers verified. Patient stated she has not received attestation letter from [employer] that was mailed out to her on 05/23/17 as of yet. Requested that patient contact me when received and mailed back in the company.  [name] [place], [place] Management [phone]

## 2017-05-29 ENCOUNTER — Ambulatory Visit (INDEPENDENT_AMBULATORY_CARE_PROVIDER_SITE_OTHER): Payer: PPO | Admitting: Family

## 2017-05-29 ENCOUNTER — Encounter: Payer: Self-pay | Admitting: Family

## 2017-05-29 ENCOUNTER — Ambulatory Visit (HOSPITAL_COMMUNITY)
Admission: RE | Admit: 2017-05-29 | Discharge: 2017-05-29 | Disposition: A | Discharge status: Home / Self Care | Payer: PPO | Source: Ambulatory Visit | Attending: Vascular Surgery | Admitting: Vascular Surgery

## 2017-05-29 ENCOUNTER — Other Ambulatory Visit: Payer: Self-pay

## 2017-05-29 VITALS — BP 152/70 | HR 70 | Temp 97.2°F | Resp 16 | Ht 62.0 in | Wt 161.0 lb

## 2017-05-29 DIAGNOSIS — E1165 Type 2 diabetes mellitus with hyperglycemia: Secondary | ICD-10-CM | POA: Diagnosis not present

## 2017-05-29 DIAGNOSIS — E1151 Type 2 diabetes mellitus with diabetic peripheral angiopathy without gangrene: Secondary | ICD-10-CM | POA: Diagnosis not present

## 2017-05-29 DIAGNOSIS — I779 Disorder of arteries and arterioles, unspecified: Secondary | ICD-10-CM | POA: Diagnosis not present

## 2017-05-29 DIAGNOSIS — Z87891 Personal history of nicotine dependence: Secondary | ICD-10-CM

## 2017-05-29 DIAGNOSIS — IMO0002 Reserved for concepts with insufficient information to code with codable children: Secondary | ICD-10-CM

## 2017-05-29 DIAGNOSIS — I739 Peripheral vascular disease, unspecified: Secondary | ICD-10-CM

## 2017-05-29 NOTE — Patient Instructions (Signed)

## 2017-05-29 NOTE — Progress Notes (Signed)
[facility] [place]   CC: Follow up peripheral artery occlusive disease  History of Present Illness Alyssa [name] is a 82 y.o. female whom Dr. Scot Dock saw in consultation on 01/26/2015 with peripheral vascular disease. Based on her exam and noninvasive studies she had evidence of infrainguinal arterial occlusive disease bilaterally. However she had stable claudication with no rest pain and no nonhealing ulcers. Dr. Scot Dock recommended a one-year follow up visit.  She has some mild bilateral lower extremity claudication mostly involving her calves. She denies any rest pain. She gets some cramps at night. She denies any nonhealing ulcers.  She has a stationary bike, is no longer using; used to use it 30 minutes daily after one of her two MI's.  States no barriers to using her stationary bike.   Her lower legs feel numb, mostly at night.   She denies any known history of stroke or TIA.   Dr. Scot Dock last evaluated pt on 05-31-16. At that time she had stable bilateral infrainguinal arterial occlusive disease. Her ABIs were slightly improved. She had an ABI 56% on the right with a toe pressure 46 mmHg. On the left ABI 74% with a toe pressure of 25. Dr. Scot Dock encouraged her to stay as active as possible. She is not a smoker. She is on aspirin and is on Plavix. Given her leg swelling and evidence of some chronic venous insufficiency, Dr. Scot Dock discussed the importance of leg elevation.  Follow up Doppler study in one year.  She no longer has swelling in her legs.    Diabetic: Yes, last A1C result on file was 9.1 from 2015, states her home glucose checks at HS are about 160's, states she does not check very often Tobacco use: former smoker, quit in 1971, smoked x 16 years  Pt meds include: Statin :Yes Betablocker: Yes ASA: Yes Other anticoagulants/antiplatelets: Plavix   Past Medical History:  Diagnosis Date  . Anemia   . CAD (coronary artery disease)    a.  2013 NSTEMI s/p DES to LAD. b. 04/03/14: NSTEMI s/p DES to OM1, DES to dRCA.  [name] Chronic diastolic CHF (congestive heart failure) ([place])    a. 2D ECHO 04/02/14 with hypokinesis in the basal inferior and inferoseptal walls. Focal and moderate concentric LVH. Overall LVEF 60-65%. G1DD. Elevated EDLV filling pressures. Mild TR.  [name] GERD (gastroesophageal reflux disease)   . History of GI bleed    a. 2013: adm for ABL anemia. 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  [name] HLD (hyperlipidemia)   . HTN (hypertension)   . Hypothyroid   . Peripheral vascular disease ([place])   . Refusal of blood transfusions as patient is Jehovah's Witness 09-29-12  . Type II diabetes mellitus ([place])     Social History Social History   Tobacco Use  . Smoking status: Former Smoker    Packs/day: 1.50    Years: 16.00    Pack years: 24.00    Types: Cigarettes    Last attempt to quit: 03/15/1970    Years since quitting: 47.2  . Smokeless tobacco: Never Used  Substance Use Topics  . Alcohol use: Yes    Alcohol/week: 1.8 - 2.4 oz    Types: 3 - 4 Standard drinks or equivalent per week    Comment: 04/03/2014 "glass of wine or a beer 2-3 times/month"  . Drug use: No    Family History Family History  Problem Relation Age of Onset  . Arthritis Father   . Heart disease Mother   . Varicose Veins Mother   . Hypertension Unknown     Past Surgical History:  Procedure Laterality Date  . CESAREAN SECTION  ~ 1961; 1966   plus 3 NVD  . COLONOSCOPY  02/27/2012   Procedure: COLONOSCOPY;  Surgeon: Lear Ng, MD;  Location: [facility] ENDOSCOPY;  Service: Endoscopy;  Laterality: N/A;  . CORONARY ANGIOPLASTY WITH STENT PLACEMENT  05/2011; 04/03/2014  . ESOPHAGOGASTRODUODENOSCOPY  02/26/2012   Procedure: ESOPHAGOGASTRODUODENOSCOPY (EGD);  Surgeon: Lear Ng, MD;  Location: [facility] ENDOSCOPY;  Service: Endoscopy;  Laterality:  N/A;  . LEFT HEART CATHETERIZATION WITH CORONARY ANGIOGRAM N/A 06/12/2011   Procedure: LEFT HEART CATHETERIZATION WITH CORONARY ANGIOGRAM;  Surgeon: Josue Hector, MD;  Location: [facility] CATH LAB;  Service: Cardiovascular;  Laterality: N/A;  . LEFT HEART CATHETERIZATION WITH CORONARY ANGIOGRAM N/A 04/03/2014   Procedure: LEFT HEART CATHETERIZATION WITH CORONARY ANGIOGRAM;  Surgeon: Jettie Booze, MD;  Location: [facility] CATH LAB;  Service: Cardiovascular;  Laterality: N/A;  . TONSILLECTOMY    . VAGINAL HYSTERECTOMY      No Known Allergies  Current Outpatient Medications  Medication Sig Dispense Refill  . aspirin 81 MG chewable tablet Take 81 mg by mouth daily.    [name] atorvastatin (LIPITOR) 80 MG tablet TAKE 1 TABLET BY MOUTH ONCE DAILY. 30 tablet 11  . azelastine (ASTELIN) 0.1 % nasal spray Place 2 sprays into both nostrils daily as needed for allergies.    . carvedilol (COREG) 3.125 MG tablet Take 1 tablet (3.125 mg total) by mouth 2 (two) times daily. Please make overdue yearly appt with Dr. Radford Pax. 2nd attempt 30 tablet 0  . clopidogrel (PLAVIX) 75 MG tablet Take 1 tablet (75 mg total) by mouth daily. Please make overdue yearly appt with Dr. Radford Pax. 2nd attempt 15 tablet 4  . Coenzyme Q10 (COQ10 PO) Take 1 capsule by mouth daily. Reported on 10/11/2015    . escitalopram (LEXAPRO) 10 MG tablet     . fluticasone (FLONASE) 50 MCG/ACT nasal spray Place 2 sprays into both nostrils daily.    . furosemide (LASIX) 20 MG tablet TAKE 1 TABLET BY MOUTH ONCE DAILY. 30 tablet 11  . hydrOXYzine (VISTARIL) 25 MG capsule 25 mg every 6 (six) hours as needed for itching.     [name] ibuprofen (ADVIL,MOTRIN) 200 MG tablet Take 400 mg by mouth every 6 (six) hours as needed for mild pain.    [name] insulin detemir (LEVEMIR) 100 UNIT/ML injection Inject 50 Units into the skin at bedtime.     [name] levocetirizine (XYZAL) 5 MG tablet Take 5 mg by mouth at bedtime.     [name] levothyroxine (SYNTHROID, LEVOTHROID) 125 MCG tablet Take 125  mcg by mouth daily.    [name] loratadine (CLARITIN) 10 MG tablet Take 10 mg by mouth daily.     [name] losartan (COZAAR) 50 MG tablet Take 1 tablet (50 mg total) by mouth daily. 90 tablet 3  . meclizine (ANTIVERT) 25 MG tablet Take 1 tablet (25 mg total) by mouth 3 (three) times daily as needed for dizziness. 30 tablet 0  . nitroGLYCERIN (NITROSTAT) 0.4 MG SL tablet Place 0.4 mg under the tongue every 5 (five) minutes as needed for chest pain. Reported on 10/04/2015    . polyethylene glycol (MIRALAX / GLYCOLAX) packet Take 17 g by mouth daily as needed (constipation). Reported on 10/04/2015    . spironolactone (ALDACTONE) 50 MG tablet Take  25 mg by mouth daily.     Karen Chafe INSULIN SYRINGE 31G X 5/16" 0.5 ML MISC     . ZETIA 10 MG tablet TAKE 1 TABLET BY MOUTH EVERY DAY 30 tablet 6   No current facility-administered medications for this visit.     ROS: See HPI for pertinent positives and negatives.   Physical Examination  Vitals:   05/29/17 1552 05/29/17 1557  BP: (!) 164/80 (!) 152/70  Pulse: 70   Resp: 16   Temp: (!) 97.2 F (36.2 C)   TempSrc: Oral   SpO2: 98%   Weight: 161 lb (73 kg)   Height: 5\' 2"  (1.575 m)    Body mass index is 29.45 kg/m.  General: A&O x 3, WDWN, female. Gait: steady, using cane HENT: No gross abnormalities.  Eyes: PERRLA. Pulmonary: Respirations are non labored, CTAB, good air movement in all fields  Cardiac: regular rhythm, no detected murmur.         Carotid Bruits Right Left   Negative Negative   Radial pulses are 2+ palpable bilaterally   Adominal aortic pulse is not palpable                         VASCULAR EXAM: Extremities without ischemic changes, without Gangrene; without open wounds.                                                                                                          LE Pulses Right Left       FEMORAL   palpable  2+ palpable        POPLITEAL  2+ palpable   not palpable       POSTERIOR TIBIAL  not palpable   not  palpable        DORSALIS PEDIS      ANTERIOR TIBIAL not palpable  not palpable    Abdomen: soft, NT, no palpable masses. Skin: no rashes, no cellulitis, no ulcers noted. Musculoskeletal: no muscle wasting or atrophy.  Neurologic: A&O X 3; appropriate affect, Sensation is normal; MOTOR FUNCTION:  moving all extremities equally, motor strength 5/5 throughout. Speech is fluent/normal. CN 2-12 intact. Psychiatric: Thought content is normal, mood appropriate for clinical situation.     ASSESSMENT: Alyssa [name] is a 82 y.o. female who has stable bilateral infrainguinal arterial occlusive disease symptomatically, but she does not seem to walk enough to elicit claudication symptoms, although she does keep busy with cooking and housekeeping.   She has diabetic neuropathy with numb feeling in both lower legs, although light touch sensation in feet does not seem diminished. There are no signs of ischemia in her feet or legs.   Her DM seems uncontrolled based on the one home glucose check last evening of 164. No A1C results on file since 2015 when it was 9.1.  Her atherosclerotic risk factors include uncontrolled DM, CAD with hx of 2 MI's, 16 year smoking hx (quit in 1971), CAD, dyslipidemia, hypertension, and lack of regular walking or exercise.   Perfusion  in her legs can improve with regular exercise of her legs, this in turn will improve her DM, serum lipids, encourage weight loss which will also help her DM, improve cardiac and pulmonary function.     DATA  ABI (Date: 05/29/2017):  R:   ABI: 0.34 (was 0.56 on 05-21-16),   PT: mono  DP: mono  TBI:  Unable to obtain due to pt movement  L:   ABI: 0.49 (was 0.74),   PT: mono  DP: mono  TBI: Unable to obtain due to pt movement   Decline in bilateral LE arterial perfusion, severe disease bilaterally, all monophasic waveforms.    PLAN:  Resume using her stationary bike, start at 5 minutes daily, eventually increase to 30  minutes daily.  And also walk 30 minutes daily, weather permitting.   I advised her to work closely with her PCP to get her blood sugar under as good control as possible.   Based on the patient's vascular studies and examination, pt will return to clinic in 6 months with ABI's. I advised her to notify us if she develops concerns re the circulation in her feet or legs.   I discussed in depth with the patient the nature of atherosclerosis, and emphasized the importance of maximal medical management including strict control of blood pressure, blood glucose, and lipid levels, obtaining regular exercise, and continued cessation of smoking.  The patient is aware that without maximal medical management the underlying atherosclerotic disease process will progress, limiting the benefit of any interventions.  The patient was given information about PAD including signs, symptoms, treatment, what symptoms should prompt the patient to seek immediate medical care, and risk reduction measures to take.  Clemon Chambers, RN, MSN, FNP-C [facility] [employer] Phone: [phone]  Clinic MD: Donzetta Matters  05/29/17 4:05 PM

## 2017-06-01 ENCOUNTER — Other Ambulatory Visit: Payer: Self-pay | Admitting: Pharmacist

## 2017-06-01 ENCOUNTER — Ambulatory Visit: Payer: Self-pay | Admitting: Pharmacist

## 2017-06-01 NOTE — Patient Outreach (Signed)
[place] [facility]) Care Management  06/01/2017  Alyssa [name] April 17, 1935 [id number]  Outreach call placed to Alyssa [name] to follow up on medication assistance and medication adherence.    Alyssa [name] reports she is doing much better after having PNA last month.   She reports that she received the [employer] letter in the mail last week and is planning on filling it out and returning it today (including entire application).  Alyssa [name] reports she is having trouble again with her glucometer and has not used it in weeks.   Alyssa [name] reports she is doing well with her compliance packaging and has not missed any medication doses.  She continues to be sent a month supply from [employer].   Alyssa [name] reports she has self-adjusted her Levemir and has increased from 50 -->  60 units QHS.    Alyssa [name] reports her co-pay for Levemir has increased and she feels she is paying too much money for it.    Assessment:  I encouraged Alyssa [name] to bring glucometer to a local pharmacy with the instruction manual for assistance.  If pharmacist unable to assist, we can request a new prescription be sent to [facility] for replacement glucometer.  Alyssa [name] reports understanding.   3 way phone call placed to [facility] regarding [place] however Alyssa [name] has not been getting her Levemir filled at [facility].  Pharmacist has old prescription on file and will need an updated prescription to fill for Alyssa [name].  Currently, one vial of insulin should last 20 days if using 50 units.  Alyssa [name] should be able to receive a 90 day supply (4 vials) for 2 copays as [facility] has cost-savings with 3 month supply fills. Alyssa [name] is agreeable to this plan and reports that she can afford this.     Call placed to Alyssa [name] office's to leave a message regarding Alyssa [name]'s need for Levemir RX sent to [facility], non-compliance with checking CBGs, and that Alyssa [name] is self-adjusting insulin.  Office closed today therefore I  will call back again tomorrow.    Plan: I will follow-up with Alyssa [name] office again tomorrow and will outreach Alyssa [name] later this week if I have not heard back from her beforehand.   Alyssa [name], PharmD, [place] [phone]

## 2017-06-02 ENCOUNTER — Ambulatory Visit: Payer: Self-pay | Admitting: Pharmacist

## 2017-06-02 ENCOUNTER — Other Ambulatory Visit: Payer: Self-pay | Admitting: Pharmacist

## 2017-06-02 NOTE — Patient Outreach (Signed)
[place] [facility]) Care Management  06/02/2017  Alyssa [name] June 07, 1935 [id number]  Care coordination call placed to patient's PCP, Dr. Vanetta Shawl regarding 1) request for a new prescription for Levemir be sent to [facility] for 90 day supply and 2) patient self-adjusting insulin.  RN, Dorian Pod, requested that I fax a copy of my note from 06/01/2017.  She will contact patient regarding a follow-up appointment with Dr. Vanetta Shawl and send new Levemir RX.    Plan: I will follow-up with Alyssa [name] later this week unless I hear back from her beforehand.   Ralene Bathe, PharmD, [place] [phone]

## 2017-06-03 ENCOUNTER — Other Ambulatory Visit: Payer: Self-pay | Admitting: *Deleted

## 2017-06-03 ENCOUNTER — Encounter: Payer: Self-pay | Admitting: *Deleted

## 2017-06-03 DIAGNOSIS — F0280 Dementia in other diseases classified elsewhere without behavioral disturbance: Secondary | ICD-10-CM | POA: Diagnosis not present

## 2017-06-03 DIAGNOSIS — Z6829 Body mass index (BMI) 29.0-29.9, adult: Secondary | ICD-10-CM | POA: Diagnosis not present

## 2017-06-03 DIAGNOSIS — E1139 Type 2 diabetes mellitus with other diabetic ophthalmic complication: Secondary | ICD-10-CM | POA: Diagnosis not present

## 2017-06-03 NOTE — Patient Outreach (Signed)
[place] [facility]) Care Management  06/03/2017  Alyssa [name] [birth year] [id number]   RN Health Coach Monthly Outreach  Referral Date:02/06/2017 Referral Source:THN Community referral Reason for Referral:Continued Disease Management Education Insurance:Health Team Advantage   Outreach Attempt:  Successful telephone outreach to patient for monthly follow up.  HIPAA verified with patient.  Patient stating she is starting to feel better after being treated with antibiotics for pneumonia.  Verbalizes she is still a little weak but slowly recovering.  Continues to report difficulties with CBG meter not working.  States it worked for a few days and then stopped working.  States she has an medical appointment today and plans to take meter to appointment for assistance.  Patient also stating she has insulin pens (Levemir) that she has been shown how to use, but she can't remember how to prepare the pen for self administration.  Reports she has increased her Levemir dose to 60 Units nightly and currently is using her last insulin vail.  Patient encouraged to take the insulin pen with needles with her to her medical appointment today for further instruction.  Also encouraged her to have her husband learn preparation technique to assist her with insulin pen preparation at home for self administration.  Encouraged patient to utilize her pharmacy as well for education on CBG meter and insulin pen.  Instructed patient to notify this [place] or [facility] Pharmacist if she continued to have difficulties with CBG meter or insulin pen after medical appointment today.  Requested patient to request prescription for new CBG meter from primary care provider today if meter could not be fixed in the office.  Patient reporting she has completed the [employer] and placed in the mail on yesterday.  Verbalizes she has been compliant with taking her medications, morning and evening, for the  last few weeks.  Appointments:  Patient reports having a scheduled medical appointment with Dr. Vanetta Shawl today at 1330.  Patient encouraged to keep and attend this medical appointment.  Plan:   RN Health Coach will make next monthly outreach to patient in the month of March.   [place] [phone] [email or web address]

## 2017-06-04 ENCOUNTER — Ambulatory Visit: Payer: Self-pay | Admitting: Pharmacist

## 2017-06-04 ENCOUNTER — Other Ambulatory Visit: Payer: Self-pay | Admitting: Pharmacist

## 2017-06-04 NOTE — Patient Outreach (Signed)
Alyssa [name]) Care Management  06/04/2017  Alyssa [name] April 22, 1935 [id number]  Successful outreach call to Alyssa [name]. HIPAA identifiers verified. Alyssa [name] reports she is feeling better since she was diagnosed with pneumonia last month.  She reports she still has not been able to use her TrueMetrix glucometer.  She also reports she is out of test strips for the glucometer.  She reports she had an office visit with Dr. Vanetta Shawl for diabetes follow-up this week and was told to increase the dose of levemir every day based on her blood sugars but cannot do this until her glucometer is working.  She reports she has ~1/2 vial of levemir solution left and found a few levemir insulin pens in her fridge  that are still within date and pen needles that she will use up.  She reports that Dr. Vanetta Shawl reviewed with her how to use the insulin pens at the last office visit.    Medication adherence: Patient unable to check her CBGs or adjust her insulin as she does not have a functioning glucometer.  Her insurance plan, [employer]TA, prefers One Touch or Free Style glucometers while patient is currently using a True-metrix glucometer which will cost more through insurance.    Call placed to Dr. Raeford Razor office to request a new glucometer and strips for One Touch or Free Style be sent to patient's pharmacy, [facility].    Call placed to [facility] for follow-up on insulin prescription that PCP called in for Levemir. Per pharmacist, prescription called in but for wrong day supply.  Test claim for 4 vials (80 day supply) approved for $90 (2 copays).  Test claim for One Touch glucometer and strips also approved for $0.  Pharmacist will call office directly for new glucometer and testing supplies and to clarify Levemir day supply.    Plan: I await call back from Dr. Dierdre Highman office and will follow-up with Pharmacy regarding insulin and glucometer.   Ralene Bathe, PharmD, [place] [phone]

## 2017-06-05 ENCOUNTER — Other Ambulatory Visit: Payer: Self-pay | Admitting: Pharmacist

## 2017-06-05 NOTE — Patient Outreach (Signed)
[place] [facility]) Care Management  06/05/2017  Alyssa [name] Jul 30, 1935 [id number]  Call placed to [facility].  One touch glucometer, test strips, and lancets received from Dr. Raeford Razor office.  Glucometer ordered and will arrive Monday to pharmacy.  It will then be mailed to patient, arriving earliest by Tuesday, February 26th. Pharmacist requested new levemir prescription for patient from nurse for 4 vials / 80 day supply but has not been received yet.    Call placed to [name] to update on expected arrival of glucometer next week.  Patient voiced understanding.   Plan: I will follow-up with [name] next week regarding glucometer to ensure it has arrived and patient understands how to use it.   Ralene Bathe, PharmD, [place] [phone]

## 2017-06-07 DIAGNOSIS — I10 Essential (primary) hypertension: Secondary | ICD-10-CM | POA: Diagnosis not present

## 2017-06-07 DIAGNOSIS — E039 Hypothyroidism, unspecified: Secondary | ICD-10-CM | POA: Diagnosis not present

## 2017-06-07 DIAGNOSIS — E1143 Type 2 diabetes mellitus with diabetic autonomic (poly)neuropathy: Secondary | ICD-10-CM | POA: Diagnosis not present

## 2017-06-07 DIAGNOSIS — F0390 Unspecified dementia without behavioral disturbance: Secondary | ICD-10-CM | POA: Diagnosis not present

## 2017-06-07 DIAGNOSIS — F329 Major depressive disorder, single episode, unspecified: Secondary | ICD-10-CM | POA: Diagnosis not present

## 2017-06-07 DIAGNOSIS — I519 Heart disease, unspecified: Secondary | ICD-10-CM | POA: Diagnosis not present

## 2017-06-07 DIAGNOSIS — E1165 Type 2 diabetes mellitus with hyperglycemia: Secondary | ICD-10-CM | POA: Diagnosis not present

## 2017-06-07 DIAGNOSIS — I350 Nonrheumatic aortic (valve) stenosis: Secondary | ICD-10-CM | POA: Diagnosis not present

## 2017-06-07 DIAGNOSIS — I2510 Atherosclerotic heart disease of native coronary artery without angina pectoris: Secondary | ICD-10-CM | POA: Diagnosis not present

## 2017-06-07 DIAGNOSIS — Z794 Long term (current) use of insulin: Secondary | ICD-10-CM | POA: Diagnosis not present

## 2017-06-07 DIAGNOSIS — E1151 Type 2 diabetes mellitus with diabetic peripheral angiopathy without gangrene: Secondary | ICD-10-CM | POA: Diagnosis not present

## 2017-06-10 ENCOUNTER — Other Ambulatory Visit: Payer: Self-pay | Admitting: Pharmacist

## 2017-06-10 ENCOUNTER — Ambulatory Visit: Payer: Self-pay | Admitting: Pharmacist

## 2017-06-10 NOTE — Patient Outreach (Signed)
[place] [facility]) Care Management  06/10/2017  Alyssa [name] 1935/11/21 [id number]  12:52PM Outreach call placed to [name] to follow-up on glucometer.  Patient reports she received new glucometer, One Touch, with strips and lancets, in the mail but she has not tried using it yet.  She states she will open it later today.  She is familiar with One Touch brand as she has used it in the past.  Patient reports she has returned the [employer] application.  She requests that I call her at another time as this is not a good time for her to talk.   12:59PM Call placed to [facility].  New prescription for Levemir 90 day supply vials has been called in and is on file for when patient needs a refill.    Plan: I will reach out to patient next Monday, March 4th, regarding glucometer.    Ralene Bathe, PharmD, [place] [phone]

## 2017-06-15 ENCOUNTER — Ambulatory Visit: Payer: Self-pay | Admitting: Pharmacist

## 2017-06-15 ENCOUNTER — Other Ambulatory Visit: Payer: Self-pay | Admitting: Pharmacist

## 2017-06-15 DIAGNOSIS — E039 Hypothyroidism, unspecified: Secondary | ICD-10-CM | POA: Diagnosis not present

## 2017-06-15 DIAGNOSIS — E1139 Type 2 diabetes mellitus with other diabetic ophthalmic complication: Secondary | ICD-10-CM | POA: Diagnosis not present

## 2017-06-15 DIAGNOSIS — E663 Overweight: Secondary | ICD-10-CM | POA: Diagnosis not present

## 2017-06-15 DIAGNOSIS — F0280 Dementia in other diseases classified elsewhere without behavioral disturbance: Secondary | ICD-10-CM | POA: Diagnosis not present

## 2017-06-15 DIAGNOSIS — Z6829 Body mass index (BMI) 29.0-29.9, adult: Secondary | ICD-10-CM | POA: Diagnosis not present

## 2017-06-15 NOTE — Patient Outreach (Signed)
[place] [facility]) Care Management  06/15/2017  ALEXSANDRIA KIVETT 05/28/1935 [id number]  Successful outreach phone call placed to Ms. Pretlow. HIPAA identifiers verified. Patient reports that she has been using the One Touch glucometer and understands how to use it.  She reports she has not checked her blood sugar yet today and does not remember what it was over the weekend.    She states that she ran out of her thyroid medication, which is not included in her compliance packs of medications, a few days ago but has had trouble filling it at a local pharmacy with the correct manufacturer.  She would like to have it filled at [place] because her daughter can pick it up for her.  She does not recall when her next appointment with Dr. Vanetta Shawl is although she previously told me she had an appointment today, March 4th, to review all her medications.   Call placed to Dr. Raeford Razor office to clarify appointment for patient.  Patient has appointment scheduled today at 2:30 PM.  Office will call Ms. Kegg to remind her of appointment today and will confirm pharmacy to send synthroid prescription.    Plan: I will follow-up with patient in 2 weeks regarding diabetes blood sugar testing and medications.   Ralene Bathe, PharmD, [place] [phone]

## 2017-06-19 ENCOUNTER — Other Ambulatory Visit: Payer: Self-pay | Admitting: Pharmacy Technician

## 2017-06-19 NOTE — Patient Outreach (Signed)
[place] [facility]) Care Management  06/19/2017  Alyssa [name] 05/29/1935 [id number]   [place] [facility]) and spoke to Dawn who stated they have not received the approval/order from [employer].  Contacted [employer] patient assistance and spoke to [place] whom confirmed patient was approved on 03/1 and the authorization was sent to [facility] on 03/4 however it takes 5-7 business days for them to receive.  Will follow up with [facility] on Wednesday 03/13  [name]. [place], [place] Management [phone]

## 2017-06-24 ENCOUNTER — Other Ambulatory Visit: Payer: Self-pay | Admitting: Pharmacy Technician

## 2017-06-24 NOTE — Patient Outreach (Signed)
[place] [facility]) Care Management  06/24/2017  Alyssa [name] 10/09/1935 [id number]   Contacted [place] pharmacy to check the status of patient Zetia order. Representative Ubaldo Glassing stated patient should receive medication on Monday the 18th.   Will contact patient on Tuesday the 19th to verify delivery.  [name] [place], [place] Management [phone]

## 2017-06-26 ENCOUNTER — Other Ambulatory Visit: Payer: Self-pay | Admitting: *Deleted

## 2017-06-26 ENCOUNTER — Encounter: Payer: Self-pay | Admitting: *Deleted

## 2017-06-26 NOTE — Patient Outreach (Addendum)
[place] [facility]) Care Management  06/26/2017  Alyssa [name] May 05, 1935 [id number]   RN Health Coach Monthly Outreach  Referral Date:02/06/2017 Referral Source:THN Community referral Reason for Referral:Continued Disease Management Education Insurance:Health Team Advantage   Outreach Attempt:  Outreach attempt #1 to patient for monthly follow up. Person answered and stated patient was sleeping.  Requested call patient back later.  Plan:  RN Health Coach will make another outreach attempt to patient within one business day if no return call back from patient.  [place] [phone] [email or web address]

## 2017-06-26 NOTE — Patient Outreach (Signed)
[place] [facility]) Care Management  06/26/2017  KIERRE DEINES [birth year] [id number]   RN Health Coach Monthly Outreach  Referral Date:02/06/2017 Referral Source:THN Community referral Reason for Referral:Continued Disease Management Education Insurance:Health Team Advantage   Outreach Attempt:  Successful telephone outreach to patient for monthly follow up.  HIPAA verified with patient.  Patient reporting sleeping a lot and feeling tired lately.  She feels this is related to her pharmacy changing the brand of her thyroid medication.  Patient stating she is going to check with [facility] in [place] to see if the have the brand she if familiar with since this is the pharmacy where she previously retrieved this medication in the recent past.  Patient also reporting she has obtained a new One Touch CBG meter with testing supplies.  States she has not checked her blood sugar recently and does not know when the last time she has taken her blood sugar.  Reports compliance with her Levemir but also verbalizes she was instructed to dose her Levemir based on her blood sugars, that she has not been monitoring.  Patient encouraged to monitor her blood sugars at least twice a day and encouraged to have a conversation with her husband and children to seek their assistance with remembering to check her blood sugars.  Patient stated her understanding and states she will speak with her family to have them assist her in remembering to check her blood sugars.  Patient stating she has been compliant with her daily medications.  Discussed with patient [place] services to assist with medication and CBG meter compliance, patient declining [place] Nurse at this time.  Appointments:  Patient last saw her primary care provider on 06/15/2017 and states she believes her follow up appointment is scheduled in April 2019.  Patient encouraged to give primary office a call to  confirm future appointment.  Verbalizes she has been diagnosed with some Dementia and a Neurologist referral has been made.  Neurology appointment is 4/18/209.  Plan:  RN Health Coach will make next monthly outreach to patient in the month of April.   [place] [phone] Antonae [email or web address]

## 2017-06-29 ENCOUNTER — Other Ambulatory Visit: Payer: Self-pay | Admitting: Pharmacist

## 2017-06-29 NOTE — Patient Outreach (Signed)
[place] [facility]) Care Management  06/29/2017  Alyssa [name] 1935/07/22 [id number]  Outreach attempt to Ms. Gravelle to follow up on Zetia patient assistance and diabetes management.  Patient requested that I call her later this week.  Plan: I will follow up with patient on Wednesday, March 20.    Ralene Bathe, PharmD, [place] [phone]

## 2017-06-30 ENCOUNTER — Ambulatory Visit: Payer: Self-pay | Admitting: Pharmacy Technician

## 2017-07-01 ENCOUNTER — Telehealth: Payer: Self-pay | Admitting: Cardiology

## 2017-07-01 ENCOUNTER — Other Ambulatory Visit: Payer: Self-pay | Admitting: Cardiology

## 2017-07-01 ENCOUNTER — Ambulatory Visit: Payer: Self-pay | Admitting: Pharmacist

## 2017-07-01 ENCOUNTER — Other Ambulatory Visit: Payer: Self-pay | Admitting: Pharmacist

## 2017-07-01 NOTE — Telephone Encounter (Signed)
Unable to reach patient, phone line is busy

## 2017-07-01 NOTE — Telephone Encounter (Signed)
Okay to refill? Please advise. Thanks, MI 

## 2017-07-01 NOTE — Patient Outreach (Signed)
[facility]) Care Management  07/01/2017  TERRIYAH WESTRA 10-07-1935 [id number]  Successful call placed with Ms. Loyola. HIPAA identifiers verified.   Patient reports she received Zetia from patient assistance program earlier this week and has started taking them.  She understands that this will continue to be delivered through the manufacturer through 04/13/2018.    Patient reports she is still having trouble with her levothyroxine.  She reports that she had it filled at [facility] but the pills made her stay awake all night so she stopped taking them altogether ~2-3 days ago.    Patient reports having trouble remembering how to use her glucometer and states she stopped checking her CBGs several weeks ago.  She states her memory is declining and she is having lots of issues with dementia.    Care coordination call placed to [facility].  Patient is out of refills.  Pharmacist confirmed previous levothyroxine brand that patient was taking (Mylan) and will [facility] from [facility].  Once transferred, pharmacist will coordinate delivery to Ms. Halsted directly.   Care coordination call placed to Dr. Raeford Razor to relay concern about memory decline and insulin administration and glucometer usage.  Per nurse, patient had office visit 2 weeks ago to review how to use glucometer and was not able to successfully check CBG on her own.  She has a follow-up appointment with Dr. Vanetta Shawl on 07/16/17 and an appointment with [facility] on 07/30/17.    Call placed to Ms. Hartshorne's daughter, [name], to discuss patient.  HIPAA compliant voicemail left.   Plan: I will follow-up with daughter again later this week.  Home visit scheduled next Monday to review glucometer and insulin.    Ralene Bathe, PharmD, [place] [phone]

## 2017-07-01 NOTE — Telephone Encounter (Signed)
New message     *STAT* If patient is at the pharmacy, call can be transferred to refill team.   1. Which medications need to be refilled? (please list name of each medication and dose if known) levothyroxine (SYNTHROID, LEVOTHROID) 125 MCG tablet  2. Which pharmacy/location (including street and city if local pharmacy) is medication to be sent to?[place], [place]  3. Do they need a 30 day or 90 day supply? [place]

## 2017-07-02 ENCOUNTER — Other Ambulatory Visit: Payer: Self-pay | Admitting: Cardiology

## 2017-07-02 ENCOUNTER — Other Ambulatory Visit: Payer: Self-pay | Admitting: Pharmacist

## 2017-07-02 ENCOUNTER — Ambulatory Visit: Payer: Self-pay | Admitting: Pharmacist

## 2017-07-02 NOTE — Patient Outreach (Signed)
[place] [facility]) Care Management  07/02/2017  WHITNEY HILLEGASS [birth year] [id number]  Successful call placed to Ms. Vining's daughter, [name].  HIPAA identifiers verified.  Daughter is aware of patient's memory decline.  We discussed patient's inability to check her blood sugar and administer insulin due to memory issues.  Daughter will communicate with her siblings and form a plan to assist Alyssa [name] with insulin.  She will also start to join Ms. Greenhaw on her office visit appointments.  She is agreeable to meet with me at patient's home next Monday afternoon.   Plan: Home visit on Monday, March 25th, at 1:00 PM.   Ralene Bathe, PharmD, [place] [phone]

## 2017-07-02 NOTE — Telephone Encounter (Signed)
Returned call to patient, explained to patient that synthroid would have to be refilled by primary MD. Patient stated she has already taken care of that and thankful for the call.

## 2017-07-06 ENCOUNTER — Other Ambulatory Visit: Payer: Self-pay | Admitting: Pharmacist

## 2017-07-06 NOTE — Patient Outreach (Signed)
[place] [facility]) Care Management  07/06/2017  Alyssa [name] [birth year] [id number]  Subjective:  Successful home visit with Alyssa [name] and her daughter, Alyssa [name].  HIPAA identifiers verified. Alyssa [name] has several previous medication compliance packages remaining in her home in addition to the current 4 weeks that were shipped to her last week.  She states that she thinks she is doing pretty well with remembering to take her medications including insulin.  Levothyroxine has been included in her compliance packages instead of filled in a separate bottle and patient requests that this be changed back to pill bottle for next month.  She reports she has not checked her CBGs for several days as she cannot find her lancets and the machine "isn't working."    Objective:  CBG during visit: 206  Medications Reviewed Today    Reviewed by Leona Singleton, RN (Registered Nurse) on 06/26/17 at 1456  Med List Status: <None>  Medication Order Taking? Sig Documenting Provider Last Dose Status Informant  aspirin 81 MG chewable tablet [id number]  Take 81 mg by mouth daily. [provider]  Active Self  atorvastatin (LIPITOR) 80 MG tablet [id number]  TAKE 1 TABLET BY MOUTH ONCE DAILY. Sueanne Margarita, MD  Active   azelastine (ASTELIN) 0.1 % nasal spray [id number]  Place 2 sprays into both nostrils daily as needed for allergies. [provider]  Active Self  carvedilol (COREG) 3.125 MG tablet [id number]  Take 1 tablet (3.125 mg total) by mouth 2 (two) times daily. Please make overdue yearly appt with Dr. Radford Pax. 2nd attempt Sueanne Margarita, MD  Active   clopidogrel (PLAVIX) 75 MG tablet [id number]  Take 1 tablet (75 mg total) by mouth daily. Please make overdue yearly appt with Dr. Radford Pax. 2nd attempt Sueanne Margarita, MD  Active   Coenzyme Q10 (COQ10 PO) [id number]  Take 1 capsule by mouth daily. Reported on 10/11/2015 [provider]  Active Self           Med Note Larwance Sachs A    Tue Oct 09, 2015  3:03 PM)    escitalopram (LEXAPRO) 10 MG tablet [id number]   [provider]  Active   fluticasone (FLONASE) 50 MCG/ACT nasal spray [id number]  Place 2 sprays into both nostrils daily. [provider]  Active Self           Med Note Iva Lento, Buell Parcel E   Thu Jan 01, 2017  1:01 PM) Taking PRN  furosemide (LASIX) 20 MG tablet [id number]  TAKE 1 TABLET BY MOUTH ONCE DAILY. Sueanne Margarita, MD  Active   hydrOXYzine (VISTARIL) 25 MG capsule [id number]  25 mg every 6 (six) hours as needed for itching.  [provider]  Active Self  ibuprofen (ADVIL,MOTRIN) 200 MG tablet [id number]  Take 400 mg by mouth every 6 (six) hours as needed for mild pain. [provider]  Active Self  insulin detemir (LEVEMIR) 100 UNIT/ML injection [id number]  Inject 50 Units into the skin at bedtime.  [provider]  Active Self           Med Note Shelby Mattocks, [facility] D   Wed Jun 03, 2017 11:16 AM) Patient reports she is taking 60 units at bedtime  levocetirizine (XYZAL) 5 MG tablet [id number]  Take 5 mg by mouth at bedtime.  [provider]  Active Self           Med Note Iva Lento, Rance Muir Jan 01, 2017  1:03  PM) Taking PRN  levothyroxine (SYNTHROID, LEVOTHROID) 125 MCG tablet [id number]  Take 125 mcg by mouth daily. [provider]  Active Self           Med Note Arvella Nigh, Lilia Pro A   Tue Oct 09, 2015  3:03 PM)    loratadine (CLARITIN) 10 MG tablet [id number]  Take 10 mg by mouth daily.  [provider]  Active Self           Med Note Iva Lento, Lacrystal Barbe E   Thu Jan 01, 2017  1:01 PM) Taking PRN  losartan (COZAAR) 50 MG tablet [id number]  Take 1 tablet (50 mg total) by mouth daily. Charlie Pitter, PA-C  Active Self           Med Note Corbin Ade   Wed Oct 22, 2016  9:11 PM)    meclizine (ANTIVERT) 25 MG tablet [id number]  Take 1 tablet (25 mg total) by mouth 3 (three) times daily as needed for dizziness. Julianne Rice, MD  Active Self   nitroGLYCERIN (NITROSTAT) 0.4 MG SL tablet [id number]  Place 0.4 mg under the tongue every 5 (five) minutes as needed for chest pain. Reported on 10/04/2015 [provider]  Active Self           Med Note Augustin Coupe, [facility]   Sun Apr 02, 2014  2:30 PM) .  polyethylene glycol (MIRALAX / GLYCOLAX) packet [id number]  Take 17 g by mouth daily as needed (constipation). Reported on 10/04/2015 [provider]  Active Self  spironolactone (ALDACTONE) 50 MG tablet [id number]  Take 25 mg by mouth daily.  [provider]  Active Self           Med Note Iva Lento, Rance Muir Jan 01, 2017  1:03 PM)    TRUEPLUS INSULIN SYRINGE 31G X 5/16" 0.5 ML MISC [id number]   [provider]  Active   ZETIA 10 MG tablet [id number]  TAKE 1 TABLET BY MOUTH EVERY DAY Sueanne Margarita, MD  Active Self           Med Note [name] A   Tue Sep 02, 2016  2:27 PM)           Assessment: Medication adherence: Patient's memory loss has increased over the last few months which is affecting her medication compliance.  Patient has missed greater than 50% of her morning medications in the last 4 weeks based on review of compliance packages. Patient has declined an alarm system in the past to help with adherence and now needs more family support with managing her medications.   I reviewed how to use the compliance packages with patient and with daughter and suggested keeping paper prompts near her coffee machine or fridge to remind patient about medications.   Call placed to [place] to request that levothyroxine be kept separate from compliance package per patient request.  Daughter voiced understanding of compliance packs and has pharmacy number to call if needed.    All OTC products, prescription PRN medications, and diabetic testing supplies were sorted and organized into separate plastic bins.  Daughter will assist with keeping these bins managed for patient.   We reviewed how to test  blood sugar with patient during visit.  Daughter able to assist as she is familiar with how to check blood sugar.    Patient declined needing assistance with insulin administration.  She takes her insulin at night and does not want to practice this afternoon.  Daughter will monitor patient's administration later this week to ensure it is done properly.   Daughter will start attending medical office appointments with patient in the future.  She is appreciative of [facility] services.  I provided my phone number if she needs to reach me in the future.   Plan: [place] will sign-off at this time.  I will send MD discipline closure letter and alert [place].    Ralene Bathe, PharmD, [place] [phone]

## 2017-07-14 ENCOUNTER — Ambulatory Visit: Payer: Self-pay | Admitting: *Deleted

## 2017-07-15 ENCOUNTER — Other Ambulatory Visit: Payer: Self-pay | Admitting: *Deleted

## 2017-07-15 NOTE — Patient Outreach (Signed)
[place] [facility]) Care Management  07/15/2017  Alyssa [name] 06-21-1935 [id number]   RN Health Coach Monthly Outreach  Referral Date:02/06/2017 Referral Source:THN Community referral Reason for Referral:Continued Disease Management Education Insurance:Health Team Advantage   Outreach Attempt:  Outreach attempt #1 to patient for monthly follow up.  Patient answered and stated she was still in the bed.  Requested a call back.  Plan:   RN Health Coach will make another outreach attempt to patient within one business day if no return call back from patient.  [place] [phone] Ion [email or web address]

## 2017-07-16 ENCOUNTER — Other Ambulatory Visit: Payer: Self-pay | Admitting: *Deleted

## 2017-07-16 NOTE — Patient Outreach (Signed)
[place] [facility]) Care Management  07/16/2017  Alyssa [name] Aug 23, 1935 [id number]   RN Health Coach Monthly Outreach  Referral Date:02/06/2017 Referral Source:THN Community referral Reason for Referral:Continued Disease Management Education Insurance:Health Team Advantage   Outreach Attempt:  Outreach attempt #2 to patient for monthly follow up. Husband answered and stated patient was on the way out to a doctor's appointment.  Plan:  RN Health Coach will send unsuccessful outreach letter to patient.  RN Health Coach will make final telephone outreach attempt to patient within the next 10 business days.  [place] [phone] Kohei [email or web address]

## 2017-07-17 DIAGNOSIS — Z6826 Body mass index (BMI) 26.0-26.9, adult: Secondary | ICD-10-CM | POA: Diagnosis not present

## 2017-07-17 DIAGNOSIS — F0280 Dementia in other diseases classified elsewhere without behavioral disturbance: Secondary | ICD-10-CM | POA: Diagnosis not present

## 2017-07-17 DIAGNOSIS — E1129 Type 2 diabetes mellitus with other diabetic kidney complication: Secondary | ICD-10-CM | POA: Diagnosis not present

## 2017-07-20 ENCOUNTER — Telehealth: Payer: Self-pay | Admitting: Pharmacist

## 2017-07-20 NOTE — Patient Outreach (Signed)
[facility]) Care Management  07/20/2017  Alyssa [name] 27-Oct-1935 [id number]   Incoming call received from Alyssa [name]'s dentist regarding patient's medications.  Patient has upcoming dental procedure on 08/05/2017 and dentist would like aspirin and clopidogrel stopped 10 days prior to procedure.  I advised dental office to reach out to Alyssa [name]'s daughter, Alyssa [name], regarding medication changes, and I will also reach out to daughter.   Unsuccessful call to Alyssa [name] today. I left a HIPAA compliant voicemail on her home phone.    Plan: I will follow-up with Alyssa [name] tomorrow regarding medication therapy with aspirin and clopidogrel.   Ralene Bathe, PharmD, [place] [phone]

## 2017-07-21 ENCOUNTER — Other Ambulatory Visit: Payer: Self-pay | Admitting: Pharmacist

## 2017-07-21 ENCOUNTER — Ambulatory Visit: Payer: Self-pay | Admitting: Pharmacist

## 2017-07-21 NOTE — Patient Outreach (Signed)
[facility]) Care Management  07/21/2017  JASIE MELESKI March 31, 1936 [id number]  Unsuccessful call placed to Ms. Hernan's daughter, [name], regarding medication changes for upcoming dental procedure on April 24th 2019. I left a HIPAA compliant voicemail requesting a return call.   Care coordination call placed to [facility].  Per pharmacist, clopidogrel included in last compliance package is a round, pink tablet with markings E  And 34 on each side of pill.   Successful call placed to Ms. Staff.  HIPAA identifiers verified.  I relayed message about stopping aspirin and clopidogrel starting Sunday, April 14th per dentist recommendations.   I provided description of clopidogrel to patient and recommended she discard this pill after punching out morning pills starting this Sunday and omit OTC aspirin starting on Sunday.   Patient voiced understanding.  I also relayed message that I was trying to reach her daughter as well and requested that patient have daughter call me if she speaks with her.   Plan: I will follow-up with patient next Monday to remind her about stopping aspirin and clopidogrel as she has memory issues.  I will also continue to reach out to daughter later this week.   Ralene Bathe, PharmD, [place] [phone]

## 2017-07-23 ENCOUNTER — Other Ambulatory Visit: Payer: Self-pay | Admitting: Pharmacist

## 2017-07-23 ENCOUNTER — Ambulatory Visit: Payer: Self-pay | Admitting: Pharmacist

## 2017-07-23 NOTE — Patient Outreach (Signed)
[facility]) Care Management  07/23/2017  Alyssa [name] July 31, 1935 [id number]  Unsuccessful call placed to Alyssa [name]'s daughter, Alyssa [name], to relay message about patient stopping clopidogrel and aspirin prior to dental extractions on 08/05/2017.  HIPAA compliant voicemail left requesting a return call.    Call placed to dentist office, Alyssa [name].  I spoke with staff who also report leaving multiple messages for daughter to return call but have not been able to reach her yet.  The dentist office will attempt to contact the son as well.  I relayed that I have left messages for daughter 3x unsuccessfully and reviewed with patient which medicine to discard however I am doubtful patient will remember or follow through on this therefore highly recommend delaying extractions until a family member can verify patient has stopped these medications prior to procedure.  Staff voiced understanding.   Plan: I will outreach Alyssa [name] next week as scheduled then sign-off again.   Alyssa [name], PharmD, [place] [phone]

## 2017-07-24 ENCOUNTER — Other Ambulatory Visit: Payer: Self-pay | Admitting: Pharmacist

## 2017-07-24 DIAGNOSIS — R159 Full incontinence of feces: Secondary | ICD-10-CM | POA: Diagnosis not present

## 2017-07-24 DIAGNOSIS — Z6828 Body mass index (BMI) 28.0-28.9, adult: Secondary | ICD-10-CM | POA: Diagnosis not present

## 2017-07-24 NOTE — Patient Outreach (Signed)
[facility]) Care Management  07/24/2017  VIOLA KINNICK May 15, 1935 [id number]   Incoming call and voicemail received from Ms. Gibby's daughter today.  Daughter reports via voicemail that she is aware her mother needs to discontinue aspirin and clopidogrel for dental procedure and will be assisting her with medication maintenance.    Unsuccessful call back to Ms. Shew's daughter.  I left a HIPAA compliant voicemail.    Plan: I will outreach on Monday as scheduled to Ms. Rosamilia and then close pharmacy case.   Ralene Bathe, PharmD, [place] [phone]

## 2017-07-27 ENCOUNTER — Other Ambulatory Visit: Payer: Self-pay | Admitting: Pharmacist

## 2017-07-27 ENCOUNTER — Encounter: Payer: Self-pay | Admitting: Gastroenterology

## 2017-07-27 NOTE — Patient Outreach (Signed)
[place] [facility]) Care Management  07/27/2017  KATHYA WILZ 03/09/1936 [id number]   Successful outreach call to Ms. Counce today. HIPAA identifiers verified. Ms. Bellot reports she has stopped taking clopidogrel and aspirin starting last week because of a dentist appointment this week.  I reminded Ms. Aven that her dentist appointment is next Wednesday, not this week. Ms. Bartram voiced understanding.  She reported that the clopidogrel was a round, pink tablet with writing on each side that she could not remember.  I reviewed with her the markings on clopidogrel.   [facility] pharmacy will close case as patient's family verified they are assisting with temporary discontinuing of antiplatelet agents prior to dental procedure to reduce bleeding risk and patient reports she has stopped taking clopidogrel and aspirin.  I am happy to assist again in the future.   Ralene Bathe, PharmD, [place] [phone]

## 2017-07-29 ENCOUNTER — Other Ambulatory Visit: Payer: Self-pay | Admitting: *Deleted

## 2017-07-29 NOTE — Patient Outreach (Signed)
Alyssa [name]) Care Management  07/29/2017  Alyssa [name] 1936/04/14 [id number]   RN Health Coach Monthly Outreach  Referral Date:02/06/2017 Referral Source:THN Community referral Reason for Referral:Continued Disease Management Education Insurance:Health Team Advantage   Outreach Attempt:  Outreach attempt #3 to patient for monthly follow up.  HIPAA verified with patient.  Patient stated she was not awake enough to have conversation and requested to give RN Health Coach a call back.  RN Health Coach requested permission to contact patient's daughter, patient did not approve to have daughter contacted.  Plan:  RN Health Coach will close case if no return call back by end of business day today per protocol and inability to complete monthly assessment.  [place] [phone] [email or web address]

## 2017-07-30 ENCOUNTER — Telehealth: Payer: Self-pay | Admitting: Neurology

## 2017-07-30 ENCOUNTER — Ambulatory Visit: Payer: PPO | Admitting: Neurology

## 2017-07-30 NOTE — Telephone Encounter (Signed)
This patient did not show for a new patient appointment today. 

## 2017-07-31 ENCOUNTER — Encounter: Payer: Self-pay | Admitting: Neurology

## 2017-08-03 ENCOUNTER — Other Ambulatory Visit: Payer: Self-pay | Admitting: Cardiology

## 2017-08-04 ENCOUNTER — Other Ambulatory Visit: Payer: Self-pay | Admitting: *Deleted

## 2017-08-04 ENCOUNTER — Encounter: Payer: Self-pay | Admitting: *Deleted

## 2017-08-04 NOTE — Patient Outreach (Signed)
[facility]) Care Management  [place]  08/04/2017   CORSICA FRANSON 20-Nov-1935 [id number]   RN Health Coach Monthly Outreach   Referral Date: 02/06/2017 Referral Source:  [place] referral Reason for Referral:  Continued Disease Management Education Insurance:  Health Team Advantage   Outreach Attempt:  Attempted telephone outreach to patient.  Patient answered and stated she was just sitting down to eat lunch and requested to return call.  RN Health Coach made telephone outreach to daughter, [name] (Release of Information on file), initially no answer and HIPAA compliant voice message left.  Received telephone call back from daughter, [name].  HIPAA verified with daughter [name].  [name] states patient has been very forgetful and has had a hard time keeping up with appointments.  Relates patient missed consultation appointment with Neurologist last week and she is unsure when appointment is rescheduled for.  [name] also states patient is due to have teeth extracted tomorrow and has been holding her Plavix, but states patient has been holding Plavix longer than she should have.  [name] reports she has had a difficult time trying to help care for her mother, work full time and raise her granddaughter.  Reports her mother has been a little resistant to assistance, stating she does "not need a babysitter".  Daughter states "I am unsure what kind of assistance my mother can receive".  [facility] CSW services discussed with daughter and daughter verbally agrees to [facility] CSW referral to assist with caregiver burnout and resources.  Requested daughter discuss CSW referral with patient.  Daughter, [name] is not sure how often patient monitors her blood sugars or how compliant she is with her insulin.  Verbalizes the prepackaged medication system is working better for the patient.  [name] is concerned about patient sleeping in the daytime and not sleeping at night and wonders if this is related  to medication.  Encouraged daughter to discuss concerns with primary care provider or Neurologist.  Encounter Medications:  Outpatient Encounter Medications as of 08/04/2017  Medication Sig Note  . aspirin 81 MG chewable tablet Take 81 mg by mouth daily.   [name] atorvastatin (LIPITOR) 80 MG tablet TAKE 1 TABLET BY MOUTH ONCE DAILY.   [name] azelastine (ASTELIN) 0.1 % nasal spray Place 2 sprays into both nostrils daily as needed for allergies.   . carvedilol (COREG) 3.125 MG tablet Take 1 tablet (3.125 mg total) by mouth 2 (two) times daily. Please keep upcoming appt for further refills   . clopidogrel (PLAVIX) 75 MG tablet Take 1 tablet (75 mg total) by mouth daily. Please make overdue yearly appt with Dr. Radford Pax. 2nd attempt   . Coenzyme Q10 (COQ10 PO) Take 1 capsule by mouth daily. Reported on 10/11/2015   . escitalopram (LEXAPRO) 10 MG tablet    . fluticasone (FLONASE) 50 MCG/ACT nasal spray Place 2 sprays into both nostrils daily. 01/01/2017: Taking PRN  . furosemide (LASIX) 20 MG tablet TAKE 1 TABLET BY MOUTH ONCE DAILY.   . hydrOXYzine (VISTARIL) 25 MG capsule 25 mg every 6 (six) hours as needed for itching.    [name] ibuprofen (ADVIL,MOTRIN) 200 MG tablet Take 400 mg by mouth every 6 (six) hours as needed for mild pain.   [name] insulin detemir (LEVEMIR) 100 UNIT/ML injection Inject 50 Units into the skin at bedtime.  06/03/2017: Patient reports she is taking 60 units at bedtime  . levocetirizine (XYZAL) 5 MG tablet Take 5 mg by mouth at bedtime.  01/01/2017: Taking PRN  .  levothyroxine (SYNTHROID, LEVOTHROID) 125 MCG tablet Take 125 mcg by mouth daily.   [name] loratadine (CLARITIN) 10 MG tablet Take 10 mg by mouth daily.  01/01/2017: Taking PRN  . losartan (COZAAR) 50 MG tablet Take 1 tablet (50 mg total) by mouth daily.   . meclizine (ANTIVERT) 25 MG tablet Take 1 tablet (25 mg total) by mouth 3 (three) times daily as needed for dizziness.   . metFORMIN (GLUCOPHAGE) 500 MG tablet Take by mouth 2 (two) times daily  with a meal.   . nitroGLYCERIN (NITROSTAT) 0.4 MG SL tablet Place 0.4 mg under the tongue every 5 (five) minutes as needed for chest pain. Reported on 10/04/2015 04/02/2014: .  . polyethylene glycol (MIRALAX / GLYCOLAX) packet Take 17 g by mouth daily as needed (constipation). Reported on 10/04/2015   . spironolactone (ALDACTONE) 50 MG tablet Take 0.5 tablets (25 mg total) by mouth daily. Please keep upcoming appt for future refills. Thank you   . TRUEPLUS INSULIN SYRINGE 31G X 5/16" 0.5 ML MISC    . ZETIA 10 MG tablet TAKE 1 TABLET BY MOUTH EVERY DAY    No facility-administered encounter medications on file as of 08/04/2017.     Functional Status:  In your present state of health, do you have any difficulty performing the following activities: 02/10/2017 01/15/2017  Hearing? N N  Vision? Y Y  Comment has cataract on right eye deciding on cataract surgery  Difficulty concentrating or making decisions? N Y  Walking or climbing stairs? N Y  Comment - -  Dressing or bathing? N N  Doing errands, shopping? N Y  Comment - husband does the Proofreader and eating ? N N  Using the Toilet? N N  In the past six months, have you accidently leaked urine? Y N  Do you have problems with loss of bowel control? N N  Managing your Medications? [place] following, now has compliance packaging   Managing your Finances? N N  Housekeeping or managing your Housekeeping? [name]  Comment friend assist with house cleaning has hired Chartered certified accountant once weekly   Some recent data might be hidden    Fall/Depression Screening: Fall Risk  08/04/2017 06/03/2017 04/28/2017  Falls in the past year? (No Data) Yes Yes  Comment unable to speak with patient to assess denies falls in the past month no falls in the past month per patient  Number falls in past yr: - 1 1  Injury with Fall? - No No  Risk Factor Category  - - -  Risk for fall due to : - History of fall(s);Medication side effect  History of fall(s);Medication side effect  Risk for fall due to: Comment - - -  Follow up - Falls prevention discussed;Education provided;Falls evaluation completed Education provided;Falls prevention discussed   PHQ 2/9 Scores 02/10/2017 01/15/2017 11/25/2016 11/19/2016 08/19/2016 06/25/2016 05/08/2016  PHQ - 2 Score 3 4 2  0 2 6 2   PHQ- 9 Score 5 7 - - 5 12 6   Exception Documentation - - - - - - -    THN CM Care Plan Problem One     Most Recent Value  Care Plan Problem One  Knowledge deficiet related to diabetes self care management   Role Documenting the Problem One  [place] for Problem One  Active  THN Long Term Goal   Daughter will attend medical appointments with patient in the next 90 days.  THN Long Term Goal Start Date  08/04/17  THN Long Term Goal Met Date  08/04/17  Interventions for Problem One Long Term Goal  Current care plan and goals discussed with daughter, sending 2019 Calendar Booklet to assist keeping track of medical appointments and CBG readings, [facility] CSW referral to assist with caregiver resources, encouraged daughter to seek assistance from her siblings with mother's care, updated daughter on upcoming scheduled appointments listed in [place], encouraged daughter to discuss sleeping issues with primary care provider and neurologist  [facility] CM Short Term Goal #1   Patient will report checking blood sugars at least 5 times a week and keeping log to take to primary appointments in the next 30 days  THN CM Short Term Goal #1 Start Date  08/04/17  Interventions for Short Term Goal #1  Previously educated patient on the importance of checking blood sugars, encouraged daughter to remind patient to check blood sugars and take insulin and other medications, encouraged daughter to discuss with primary care provider medications and possible medication adjustments, encouraged daugther to utilize Calendar Booklet to record CBG readings, educated daughter on the  importance of monitoring blood sugars while on insulin, discussed with daughter medicaiton compliance  THN CM Short Term Goal #2   Patient will report less feelings of sleepiness and tiredness in the next 30 days.  THN CM Short Term Goal #2 Start Date  08/04/17  Interventions for Short Term Goal #2  Encouraged daughter to discuss her concerns of patient sleeping most of the daytime and awake at night with patient's primary care provider, encouraged daughter to attempt to reschedule missed neurology consultation for sooner appointment she can attend with patient, encouraged daughter to attempt to get patient on a better morning routine/schedule     Appointments:  Daughter has not been attending medical appointments with patient, states she is unaware of when patient's appointments are.  Verbally states she is going to have to start attending appointments and marking them on a calendar to track appointments.  Verbalizes patient missed consultation appointment with Neurologist and has frequently missed medical appointments due to forgetfulness.  Daughter encouraged to contact primary care provider to see when next scheduled appointment is and attend appointment to discuss medications, medication options and concerns.  Informed daughter next scheduled Neurologist appointment in the system was in late June.  Encouraged her to contact Neurologist to see if appointment could be rescheduled for sooner time.  Plan: RN Health Coach will send patient 2019 Calendar booklet to help patient and family organize and manage medical appointments. RN Health Coach will send primary care provider Quarterly Update. RN Health Coach will place [facility] CSW referral to assist with caregiver resources and burnout. RN Health Coach will attempt next monthly telephone outreach in the month of May.  [place] [phone] [email or web address]

## 2017-08-06 ENCOUNTER — Encounter: Payer: Self-pay | Admitting: Cardiology

## 2017-08-06 ENCOUNTER — Ambulatory Visit: Payer: PPO | Admitting: Podiatry

## 2017-08-06 ENCOUNTER — Telehealth: Payer: Self-pay | Admitting: *Deleted

## 2017-08-06 NOTE — Telephone Encounter (Signed)
Received fax request from [facility] for Metformin 500 mg tablets. previously prescribed by Dr. Vanetta Shawl who is no longer on staff @ [facility].  I called pt's daughter Alyssa [name] (HIPPA contact) and left message that I needed to speak to her regarding her mother's medication. Per chart review, pt has not been seen @ this office. I called the pharmacy and notified them of this information. The staff member searched information in data base for Dr. Vanetta Shawl and found that she is practicing in [place], [place]. She updated the information and apologized for the inconvenience. I called pt's daughter back and left additional message stating that I no longer had a question regarding her mother's medication. [facility] [employer] will request the refill from the appropriate medical office.

## 2017-08-10 ENCOUNTER — Ambulatory Visit: Payer: PPO | Admitting: Podiatry

## 2017-08-12 ENCOUNTER — Other Ambulatory Visit: Payer: Self-pay | Admitting: *Deleted

## 2017-08-12 NOTE — Patient Outreach (Addendum)
[facility]) Care Management  08/12/2017  AEMILIA DEDRICK 09-23-1935 [id number]   CSW made phone contact with patient and was able confirm identity and introduce self and THN role.  Pt reports she lives with her husband and that she is recovering from dental work.  She asked that CSW call her daughter for further conversation about needs. CSW has attempted to reach daughter as well and will try again later this week.   Eduard Clos, MSW, [place] Worker  [place] [phone]

## 2017-08-18 ENCOUNTER — Other Ambulatory Visit: Payer: Self-pay | Admitting: *Deleted

## 2017-08-18 NOTE — Patient Outreach (Signed)
[place] [facility]) Care Management  08/18/2017  Alyssa [name] [birth year] [id number]   CSW was able to reach pt's daughter who reports her mother's main issue is dementia. She indicates that they have not had assessment or conversation with PCP about this and that her mother has not had any workup, treatment or RX begun.  CSW encouraged daughter to start with PCP to plan an office visit where this can be discussed/assessed for better diagnosing/treatment and planning.  Daughter requests that CSW mail resources to them for review.  CSW will update [facility] team and PCP of above and daughter will reach out to [facility] CSW if further needs, questions or support is needed.   CSW will mail pertinent resources to them for review and plan case closure at this time.   Eduard Clos, MSW, [place] Worker  [place] [phone]

## 2017-08-18 NOTE — Patient Outreach (Signed)
[place] [facility]) Care Management  08/18/2017  ANECIA NUSBAUM 08/24/1935 [id number]  BSW mailed patient's daughter community resources as requested by CSW, Eduard Clos.  Daneen Schick, BSW, CDP Social Worker Cell # [phone] [email or web address]

## 2017-08-24 ENCOUNTER — Other Ambulatory Visit: Payer: Self-pay | Admitting: Cardiology

## 2017-08-25 ENCOUNTER — Other Ambulatory Visit: Payer: Self-pay | Admitting: Cardiology

## 2017-08-25 ENCOUNTER — Ambulatory Visit: Payer: Self-pay | Admitting: Cardiology

## 2017-08-25 NOTE — Telephone Encounter (Signed)
Patient no-showed her appointment today but is requesting refills.

## 2017-09-03 ENCOUNTER — Other Ambulatory Visit: Payer: Self-pay | Admitting: *Deleted

## 2017-09-03 NOTE — Patient Outreach (Addendum)
[facility] [facility]) Care Management  09/03/2017  Alyssa [name] 11-17-1935 [id number]   RN Health Coach Monthly Outreach  Referral Date:02/06/2017 Referral Source:[facility]N Community referral Reason for Referral:Continued Disease Management Education Insurance:Health Team Advantage   Outreach Attempt:  Outreach attempt #1 to patient for monthly follow up. Patient answered and stated she was on her way out the door to go grocery shopping.  Requesting a call back.  Plan:  RN Health Coach will send unsuccessful outreach letter to patient.  RN Health Coach will make another outreach attempt to patient within 3-4 business days if no return call back from patient.  [place] [phone] [name]

## 2017-09-09 ENCOUNTER — Ambulatory Visit: Payer: Self-pay | Admitting: Gastroenterology

## 2017-09-10 ENCOUNTER — Ambulatory Visit: Payer: Self-pay | Admitting: *Deleted

## 2017-09-11 ENCOUNTER — Encounter: Payer: Self-pay | Admitting: *Deleted

## 2017-09-11 ENCOUNTER — Other Ambulatory Visit: Payer: Self-pay | Admitting: *Deleted

## 2017-09-11 NOTE — Patient Outreach (Signed)
[place] [facility]) Care Management  09/11/2017  ANIYA JOLICOEUR 1935-10-18 [id number]   RN Health Coach Monthly Outreach  Referral Date:02/06/2017 Referral Source:THN Community referral Reason for Referral:Continued Disease Management Education Insurance:Health Team Advantage   Outreach Attempt:  Successful telephone outreach to patient for monthly follow up.  HIPAA verified with patient.  Patient stating she has been in the process of having dental work done.  Reports she has taken her synthroid this morning but has not taken her morning medications as of yet.  Reports waking up around 12 noon today and it is 4 pm.  States she has eaten but not taken her blood sugar.  Patient is stating she does not remember the last time she has taken her blood sugar and does not remember the last time she has taken her insulin.  Patient did pause while on the telephone and take her other morning medications. Patient also stating she does not remember the last time she seen her primary care provider.  Gave verbal permission for me to contact her daughter.  Successful telephone outreach to patient's daughter, [name].  HIPAA reverified with [name].  Spoke with daughter and informed her of conversation with patient and patient stating she does not remember the last time she has checked her blood sugar or last time she has taken her insulin.  Discussed with daughter the dangers of taking insulin without monitoring blood sugars and the dangers of not taking her insulin.  Encouraged and discussed with daughter the possibility of patient needing someone to assist her with monitoring blood sugars and taking insulin.  Discussed possibility of daughter hiring home health aides to assist with this process if family is not available to assist.  Also discussed with daughter that patient had not taken her morning medications prior to 4 pm today.  Attempted outreach to Dr. Vanetta Shawl, nurse was not available, message  left with [place] requesting telephone call back to update physician on situation.  Appointments:  Patient and daughter both do not remember last appointment with primary care provider.  Encouraged daughter to call provider and schedule an appointment as soon as possible to discuss patient's memory issues and also encouraged daughter to attend the medical appointment with the patient.  Plan: RN Health Coach will await return call back from Dr. Raeford Razor office. RN Health Coach will make next monthly telephone outreach to patient in the month of June.  [place] [phone] [email or web address]

## 2017-09-15 ENCOUNTER — Other Ambulatory Visit: Payer: Self-pay | Admitting: *Deleted

## 2017-09-15 NOTE — Patient Outreach (Signed)
[place] [facility]) Care Management  09/15/2017  Alyssa [name] 1936-01-22 [id number]   RN Health Coach Monthly Outreach  Referral Date:02/06/2017 Referral Source:THN Community referral Reason for Referral:Continued Disease Management Education Insurance:Health Team Advantage   Outreach Attempt:  Received return call from Olin Hauser, Dr. Raeford Razor nurse on 09/14/2017.  RN Health Coach updated Olin Hauser on conversation with patient and daughter last week concerning unknown timing of last dose of insulin and last time monitoring blood sugar.  Discussed with [place] conversation with daughter concerning making appointment with Dr. Vanetta Shawl, discussing patient's increasing memory problems and possible need for home health aide to assist with monitoring medication administration.  Discussed with Olin Hauser need for physician to encourage more family involvement or home health assistance with medication reminders and possible change administration time of insulin to morning time when someone is available to remind or assist patient.  Olin Hauser stated she would discuss with Dr. Vanetta Shawl.  Plan:  RN Health Coach will make next monthly telephone outreach to patient in the month of June.  [place] [phone] [email or web address]

## 2017-09-21 ENCOUNTER — Other Ambulatory Visit: Payer: Self-pay | Admitting: Cardiology

## 2017-09-29 ENCOUNTER — Encounter: Payer: Self-pay | Admitting: *Deleted

## 2017-09-29 ENCOUNTER — Other Ambulatory Visit: Payer: Self-pay | Admitting: *Deleted

## 2017-09-29 NOTE — Patient Outreach (Addendum)
[facility]) Care Management  09/29/2017  Alyssa [name] 07-08-1935 [id number]   [place] Monthly Outreach  Referral Date:02/06/2017 Referral Source:[facility] referral Reason for Referral:Continued Disease Management Education Insurance:[employer]   Outreach Attempt: Successful telephone outreach to patient for monthly follow up.  HIPAA verified with patient.  Patient increasingly more forgetful.  States she does not know the last time she has taken her blood sugar, her insulin, nor her morning medications other than her Synthroid.  Requested patient take her medications while on the telephone with me.  Patient placed me on hold to find medications and came back and stated she could only find her "cholesterol medicine".  Informed patient her medicines are coming prepackaged from the pharmacy, patient stated that's right and found her prepackaged medications.  Patient took morning medications for today this afternoon around 4:30 pm.  Patient also expressing that she is really depressed and would like to speak with a therapist.  Depression screening completed and patient scoring very high.  Patient unsure of previous therapist name.  Verbally agrees for [place] [facility] referral for depression assistance.  Patient gave permission for Health Coach to speak with daughter, Alyssa [name].  RN Health Coach reached out to patient's daughter, Alyssa [name].  HIPAA verified with daughter.  Informed daughter of patient unsure of last time she taken her medications, insulin and blood sugar and that patient had forgotten her medications came prepackaged.  Daughter stating she will stop by patient's house today.  Endorses patient and husband are having issues causing more stress for patient and depression.  Daughter does not remember therapist name but states she will try to arrange an appointment.  Alyssa [name] states she has been discussing options with her siblings to assist patient with  her care.  Appointments:  Patient unsure of her last appointment with Dr. Vanetta Shawl nor when her next appointment is.  Daughter stating patient has appointment on 10/16/2017 with Dr. Vanetta Shawl which she plans to attend also.  Daughter updated and reminded of upcoming rescheduled missed appointment with Neurology, June 25 at 2 pm with Dr. Jannifer Franklin.  Plan: RN Health Coach will make next monthly outreach to patient in the month of July.  [place] [phone] [name]

## 2017-10-01 ENCOUNTER — Encounter: Payer: Self-pay | Admitting: *Deleted

## 2017-10-01 ENCOUNTER — Other Ambulatory Visit: Payer: Self-pay | Admitting: *Deleted

## 2017-10-01 NOTE — Patient Outreach (Signed)
[facility]) Care Management  10/01/2017  Alyssa [name] 31-Dec-1935 [id number]   CSW attempted to reach pt today for initial phone outreach. CSW was unsuccessful in reaching pt by phone.  CSW will send pt an unsuccessful outreach letter and attempt to reach pt again in the next 1-2 business days.   Eduard Clos, MSW, [place] Worker  [place] [phone]

## 2017-10-02 ENCOUNTER — Other Ambulatory Visit: Payer: Self-pay | Admitting: *Deleted

## 2017-10-02 ENCOUNTER — Ambulatory Visit: Payer: Self-pay | Admitting: *Deleted

## 2017-10-02 NOTE — Patient Outreach (Signed)
[facility]) Care Management  10/02/2017  Alyssa [name] 04-14-1936 [id number]   CSW contacted and reached pt today by phone. CSW confirmed identity and introduced self and reason for call. Per patient she is interested in getting reconnected with therapy but reports, "I was almost asleep and need to call you back".  CSW advised by [facility] staff that pt has some dementia and cannot recall her therapists name. CSW was advised to call daughter, [name]. CSW will plan to call pt again as well as outreach daughter for further info/support.   Eduard Clos, MSW, [place] Worker  [place] [phone]

## 2017-10-06 ENCOUNTER — Encounter: Payer: Self-pay | Admitting: Neurology

## 2017-10-06 ENCOUNTER — Other Ambulatory Visit: Payer: Self-pay

## 2017-10-06 ENCOUNTER — Ambulatory Visit: Payer: PPO | Admitting: Neurology

## 2017-10-06 ENCOUNTER — Other Ambulatory Visit: Payer: Self-pay | Admitting: *Deleted

## 2017-10-06 VITALS — BP 121/68 | HR 70 | Ht 61.0 in | Wt 153.0 lb

## 2017-10-06 DIAGNOSIS — R413 Other amnesia: Secondary | ICD-10-CM | POA: Insufficient documentation

## 2017-10-06 DIAGNOSIS — E538 Deficiency of other specified B group vitamins: Secondary | ICD-10-CM | POA: Diagnosis not present

## 2017-10-06 HISTORY — DX: Other amnesia: R41.3

## 2017-10-06 NOTE — Progress Notes (Signed)
Reason for visit: Memory disturbance  Referring physician: Dr. Jalene Mullet is a 82 y.o. female  History of present illness:  Alyssa [name] is a 82 year old right-handed white female with a history of a gradually progressive memory disturbance.  The patient has had troubles with her memory for at least one year, she comes in with her husband and her son.  The patient has also been using a cane for ambulation around the same time.  The patient has had increasing problems with short-term memory, difficulty with word finding, she will lose her train of thought with talking.  She has difficulty remembering names for people.  She has not been driving much, her husband has been concerned about safety with driving.  The patient does the finances some, and she has difficulty keeping up with her medications and appointments.  The patient is able to cook without difficulty.  She does report that she has some difficulty with insomnia, she denies any significant fatigue during the day.  She reports no numbness or weakness of the extremities, she does have vertigo at times and she has had an occasional fall associated with this.  The patient does have some incontinence of the bladder on occasion.  She reports no definite family history of memory problems.  She has undergone MRI of the brain within the last year that shows mild to moderate levels of small vessel ischemic changes and diffuse cortical atrophy is seen.  The patient is not on any medications for memory currently.  Past Medical History:  Diagnosis Date  . Anemia   . CAD (coronary artery disease)    a. 2013 NSTEMI s/p DES to LAD. b. 04/03/14: NSTEMI s/p DES to OM1, DES to dRCA.  [name] Chronic diastolic CHF (congestive heart failure) ([place])    a. 2D ECHO 04/02/14 with hypokinesis in the basal inferior and inferoseptal walls. Focal and moderate concentric LVH. Overall LVEF 60-65%. G1DD. Elevated EDLV filling pressures. Mild TR.  . Diverticulosis     . GERD (gastroesophageal reflux disease)   . History of GI bleed    a. 2013: adm for ABL anemia. 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  [name] HLD (hyperlipidemia)   . HTN (hypertension)   . Hypothyroid   . Peripheral vascular disease ([place])   . Refusal of blood transfusions as patient is Jehovah's Witness 09-29-12  . Serrated polyp of colon 2013  . Type II diabetes mellitus ([place])     Past Surgical History:  Procedure Laterality Date  . CESAREAN SECTION  ~ 1961; 1966   plus 3 NVD  . COLONOSCOPY  02/27/2012   Procedure: COLONOSCOPY;  Surgeon: Lear Ng, MD;  Location: [facility] ENDOSCOPY;  Service: Endoscopy;  Laterality: N/A;  . CORONARY ANGIOPLASTY WITH STENT PLACEMENT  05/2011; 04/03/2014  . ESOPHAGOGASTRODUODENOSCOPY  02/26/2012   Procedure: ESOPHAGOGASTRODUODENOSCOPY (EGD);  Surgeon: Lear Ng, MD;  Location: [facility] ENDOSCOPY;  Service: Endoscopy;  Laterality: N/A;  . LEFT HEART CATHETERIZATION WITH CORONARY ANGIOGRAM N/A 06/12/2011   Procedure: LEFT HEART CATHETERIZATION WITH CORONARY ANGIOGRAM;  Surgeon: Josue Hector, MD;  Location: [facility] CATH LAB;  Service: Cardiovascular;  Laterality: N/A;  . LEFT HEART CATHETERIZATION WITH CORONARY ANGIOGRAM N/A 04/03/2014   Procedure: LEFT HEART CATHETERIZATION WITH CORONARY ANGIOGRAM;  Surgeon: Jettie Booze, MD;  Location: [facility] C[facility] LAB;  Service: Cardiovascular;  Laterality: N/A;  . TONSILLECTOMY    . VAGINAL  HYSTERECTOMY      Family History  Problem Relation Age of Onset  . Arthritis Father   . Heart disease Mother   . Varicose Veins Mother   . Hypertension Unknown     Social history:  reports that she quit smoking about 47 years ago. Her smoking use included cigarettes. She has a 24.00 pack-year smoking history. She has never used smokeless tobacco. She reports that she drinks about 1.8 - 2.4 oz of alcohol per week. She  reports that she does not use drugs.  Medications:  Prior to Admission medications   Medication Sig Start Date End Date Taking? Authorizing Provider  aspirin 81 MG chewable tablet Take 81 mg by mouth daily.   Yes [provider]  atorvastatin (LIPITOR) 80 MG tablet TAKE 1 TABLET BY MOUTH ONCE DAILY. 12/25/16  Yes Turner, Eber Hong, MD  azelastine (ASTELIN) 0.1 % nasal spray Place 2 sprays into both nostrils daily as needed for allergies. 07/23/16  Yes [provider]  carvedilol (COREG) 3.125 MG tablet Take 1 tablet (3.125 mg total) by mouth 2 (two) times daily. Please keep upcoming appt in July before anymore refills. Thank you 09/22/17  Yes Turner, Eber Hong, MD  clopidogrel (PLAVIX) 75 MG tablet Take 1 tablet (75 mg total) by mouth daily. Please keep upcoming appt in July before anymore refills. Thank you 09/22/17  Yes Turner, Eber Hong, MD  Coenzyme Q10 (COQ10 PO) Take 1 capsule by mouth daily. Reported on 10/11/2015   Yes [provider]  escitalopram (LEXAPRO) 10 MG tablet  02/23/17  Yes [provider]  fluticasone (FLONASE) 50 MCG/ACT nasal spray Place 2 sprays into both nostrils daily.   Yes [provider]  furosemide (LASIX) 20 MG tablet TAKE 1 TABLET BY MOUTH ONCE DAILY. 12/25/16  Yes Turner, Eber Hong, MD  hydrOXYzine (VISTARIL) 25 MG capsule 25 mg every 6 (six) hours as needed for itching.  02/18/16  Yes [provider]  ibuprofen (ADVIL,MOTRIN) 200 MG tablet Take 400 mg by mouth every 6 (six) hours as needed for mild pain.   Yes [provider]  insulin detemir (LEVEMIR) 100 UNIT/ML injection Inject 50 Units into the skin at bedtime.    Yes [provider]  levocetirizine (XYZAL) 5 MG tablet Take 5 mg by mouth at bedtime.  07/24/16  Yes [provider]  levothyroxine (SYNTHROID, LEVOTHROID) 125 MCG tablet Take 125 mcg by mouth daily. 07/05/15  Yes [provider]  loratadine (CLARITIN) 10 MG tablet Take 10 mg  by mouth daily.    Yes [provider]  losartan (COZAAR) 50 MG tablet Take 1 tablet (50 mg total) by mouth daily. 04/11/14  Yes Dunn, Nedra Hai, PA-C  meclizine (ANTIVERT) 25 MG tablet Take 1 tablet (25 mg total) by mouth 3 (three) times daily as needed for dizziness. 10/22/16  Yes Julianne Rice, MD  metFORMIN (GLUCOPHAGE) 500 MG tablet Take by mouth 2 (two) times daily with a meal.   Yes [provider]  nitroGLYCERIN (NITROSTAT) 0.4 MG SL tablet Place 0.4 mg under the tongue every 5 (five) minutes as needed for chest pain. Reported on 10/04/2015   Yes [provider]  polyethylene glycol (MIRALAX / GLYCOLAX) packet Take 17 g by mouth daily as needed (constipation). Reported on 10/04/2015   Yes [provider]  spironolactone (ALDACTONE) 50 MG tablet Take 0.5 tablets (25 mg total) by mouth daily. Please keep upcoming appt for future refills. Thank you 07/02/17  Yes Turner,  Eber Hong, MD  TRUEPLUS INSULIN SYRINGE 31G X 5/16" 0.5 ML MISC  12/31/16  Yes [provider]  ZETIA 10 MG tablet TAKE 1 TABLET BY MOUTH EVERY DAY 03/05/15  Yes Sueanne Margarita, MD      Allergies  Allergen Reactions  . Tetanus Toxoid, Adsorbed Swelling and Rash    ROS:  Out of a complete 14 system review of symptoms, the patient complains only of the following symptoms, and all other reviewed systems are negative.  Memory loss, confusion, headache, weakness, dizziness Depression Not enough sleep, insomnia   Blood pressure 121/68, pulse 70, height 5\' 1"  (1.549 m), weight 153 lb (69.4 kg), SpO2 98 %.  Physical Exam  General: The patient is alert and cooperative at the time of the examination.  The patient has moderately obese.  Eyes: Pupils are equal, round, and reactive to light. Discs are flat bilaterally.  Neck: The neck is supple, no carotid bruits are noted.  Respiratory: The respiratory examination is clear.  Cardiovascular: The cardiovascular examination reveals a  regular rate and rhythm, no obvious murmurs or rubs are noted.  Skin: Extremities are with trace edema at the ankles bilaterally.  Neurologic Exam  Mental status: The patient is alert and oriented x 3 at the time of the examination. The Mini-Mental status examination done today shows a total score 24/30.  Cranial nerves: Facial symmetry is present. There is good sensation of the face to pinprick and soft touch bilaterally. The strength of the facial muscles and the muscles to head turning and shoulder shrug are normal bilaterally. Speech is well enunciated, no aphasia or dysarthria is noted. Extraocular movements are full. Visual fields are full. The tongue is midline, and the patient has symmetric elevation of the soft palate. No obvious hearing deficits are noted.  Motor: The motor testing reveals 5 over 5 strength of all 4 extremities. Good symmetric motor tone is noted throughout.  Sensory: Sensory testing is intact to pinprick, soft touch, vibration sensation, and position sense on all 4 extremities, with exception of a significant decrease in vibration sensation on the right lower extremity. No evidence of extinction is noted.  Coordination: Cerebellar testing reveals good finger-nose-finger and heel-to-shin bilaterally.  Gait and station: Gait is slightly wide-based, the patient uses a cane for ambulation.  Tandem gait is unsteady.  Romberg is positive. No drift is seen.  Reflexes: Deep tendon reflexes are symmetric, but are depressed bilaterally. Toes are downgoing bilaterally.   MRI brain 10/22/16:  IMPRESSION: 1. No acute intracranial process. 2. Old lacunar infarcts and, moderate chronic small vessel ischemic disease. 3. Chronic RIGHT maxillary sinusitis.  * MRI scan images were reviewed online. I agree with the written report.    Assessment/Plan:  1.  Progressive memory disturbance  2.  Gait disturbance  MRI of the brain does show some degree of small vessel ischemic  changes, and significant cortical atrophy.  I discussed the possibility of starting a medication for memory, the patient does not wish to consider this option at this time.  She will have blood work done today, we will follow the memory troubles over time.  Jill Alexanders MD 10/06/2017 2:14 PM  [facility] [address] [place], [place] [address]  Phone [phone] Fax [phone]

## 2017-10-07 ENCOUNTER — Telehealth: Payer: Self-pay | Admitting: *Deleted

## 2017-10-07 LAB — VITAMIN B12: VITAMIN B 12: 1127 pg/mL (ref 232–1245)

## 2017-10-07 LAB — RPR: RPR: NONREACTIVE

## 2017-10-07 LAB — SEDIMENTATION RATE: SED RATE: 14 mm/h (ref 0–40)

## 2017-10-07 NOTE — Patient Outreach (Signed)
[facility]) Care Management  10/07/2017  SHAMYIA GRANDPRE 07-27-1935 [id number]   CSW contacted pt and was advised they were on the way out the door. CSW will call back later in the week to assist pt with coordination of appointment with therapist.   Eduard Clos, MSW, [place] Worker  [place] [phone]

## 2017-10-07 NOTE — Telephone Encounter (Signed)
Called and spoke with patient about unremarkable labs per CW,MD note. Also called son, Wille Glaser (on [place]) and relayed results.  Advised husband not on DPR. They would need to fill new one out if they want [place] to be able to speak with him as well.

## 2017-10-07 NOTE — Telephone Encounter (Signed)
-----   Message from Kathrynn Ducking, MD sent at 10/07/2017  7:18 AM EDT -----  The blood work results are unremarkable. Please call the patient.  ----- Message ----- From: Lavone Neri Lab Results In Sent: 10/07/2017   5:38 AM To: Kathrynn Ducking, MD

## 2017-10-09 ENCOUNTER — Other Ambulatory Visit: Payer: Self-pay | Admitting: *Deleted

## 2017-10-09 NOTE — Patient Outreach (Signed)
[name]) Care Management  10/09/2017  Alyssa [name] 07-08-1935 [id number]   CSW spoke with patient by phone today. CSW provided pt with specifics related to appointment arranged for her with Alyssa Amsler, LCSW, for 11/10/2017 at 4pm. Pt was able to write down the time, date, address and phone # for therapist. Pt last saw this provider in August of last year and is pleased to be able to get back with her. Pt shared with CSW some recent struggles with her husband. She is hopeful this will be helpful.  Pt appreciative of CSW support and assistance. CSW will plan a f/u call - reminder to pt prior to 11/10/2017.   Eduard Clos, MSW, [place] Worker  [place] [phone]

## 2017-10-16 DIAGNOSIS — I739 Peripheral vascular disease, unspecified: Secondary | ICD-10-CM | POA: Diagnosis not present

## 2017-10-16 DIAGNOSIS — E1139 Type 2 diabetes mellitus with other diabetic ophthalmic complication: Secondary | ICD-10-CM | POA: Diagnosis not present

## 2017-10-16 DIAGNOSIS — I2510 Atherosclerotic heart disease of native coronary artery without angina pectoris: Secondary | ICD-10-CM | POA: Diagnosis not present

## 2017-10-16 DIAGNOSIS — Z6837 Body mass index (BMI) 37.0-37.9, adult: Secondary | ICD-10-CM | POA: Diagnosis not present

## 2017-10-16 DIAGNOSIS — Z1339 Encounter for screening examination for other mental health and behavioral disorders: Secondary | ICD-10-CM | POA: Diagnosis not present

## 2017-10-16 DIAGNOSIS — F0280 Dementia in other diseases classified elsewhere without behavioral disturbance: Secondary | ICD-10-CM | POA: Diagnosis not present

## 2017-10-19 ENCOUNTER — Other Ambulatory Visit: Payer: Self-pay | Admitting: Cardiology

## 2017-10-20 ENCOUNTER — Encounter: Payer: Self-pay | Admitting: *Deleted

## 2017-10-20 ENCOUNTER — Other Ambulatory Visit: Payer: Self-pay | Admitting: *Deleted

## 2017-10-20 NOTE — Patient Outreach (Signed)
[facility]) Care Management  [place]  10/20/2017   Alyssa [name] 07-02-1935 [id number]     Referral Date: 02/06/2017 Referral Source:  [place] referral Reason for Referral:  Continued Disease Management Education Insurance:  Health Team Advantage   Outreach Attempt:  Successful telephone outreach to patient for monthly follow up.  Patient stated she was getting ready for meeting later and requested I speak with her daughter, [name].  Telephone outreach to daughter, [name].  HIPAA verified with [name].  [name] reports patient continue to be depressed and is feeling overwhelmed with a lot of calls and questions concerning her medical condition.  States patient is requesting, [facility] staff contact daughter, [name] for monthly calls/follow up.  Daughter does stated patient has attended appointments with both her medical physician and Neurologist, with her son and daughter accompanying her to the appointments.  Daughter verbalizes she is assisting her mother with her medications and states from the prepackaged medications, patient has been taking the medications.  Still voices some concern for monitoring blood sugars and taking insulin, and how to assist patient with monitoring and administering.  Daughter states patient still sleeping to late afternoon and not sleeping at night.  Encouraged patient to discuss patient's sleep habits with physicians.  Encouraged and discussed with daughter checking blood sugars at dinner time or bedtime; discussed consistency with insulin administration.  Daughter reports latest Hgb A1C was 11 at last medical visit.  Appointments:  Patient and daughter attended medical appointment with Dr. Vanetta Shawl on 10/16/2017 and has follow up appointment on 8/27.  Patient and son attended Neurology appointment on 10/06/2017.  Patient has therapy appointment on 11/10/2017.  Encouraged to keep and attend her medical appointments.  Encounter Medications:  Outpatient  Encounter Medications as of 10/20/2017  Medication Sig Note  . aspirin 81 MG chewable tablet Take 81 mg by mouth daily.   [name] atorvastatin (LIPITOR) 80 MG tablet Take 1 tablet (80 mg total) by mouth daily. Please keep upcoming appt with Dr. Radford Pax before anymore refills. Thank you   . azelastine (ASTELIN) 0.1 % nasal spray Place 2 sprays into both nostrils daily as needed for allergies.   . carvedilol (COREG) 3.125 MG tablet Take 1 tablet (3.125 mg total) by mouth 2 (two) times daily. Please keep upcoming appt in July before anymore refills. Thank you   . clopidogrel (PLAVIX) 75 MG tablet Take 1 tablet (75 mg total) by mouth daily. Please keep upcoming appt in July before anymore refills. Thank you   . Coenzyme Q10 (COQ10 PO) Take 1 capsule by mouth daily. Reported on 10/11/2015   . escitalopram (LEXAPRO) 10 MG tablet    . fluticasone (FLONASE) 50 MCG/ACT nasal spray Place 2 sprays into both nostrils daily. 01/01/2017: Taking PRN  . furosemide (LASIX) 20 MG tablet Take 1 tablet (20 mg total) by mouth daily. Please keep upcoming appt with Dr. Radford Pax before anymore refills. Thank you   . hydrOXYzine (VISTARIL) 25 MG capsule 25 mg every 6 (six) hours as needed for itching.    [name] ibuprofen (ADVIL,MOTRIN) 200 MG tablet Take 400 mg by mouth every 6 (six) hours as needed for mild pain.   [name] insulin detemir (LEVEMIR) 100 UNIT/ML injection Inject 50 Units into the skin at bedtime.  06/03/2017: Patient reports she is taking 60 units at bedtime  . levocetirizine (XYZAL) 5 MG tablet Take 5 mg by mouth at bedtime.  01/01/2017: Taking PRN  . levothyroxine (SYNTHROID, LEVOTHROID) 125 MCG tablet Take 125  mcg by mouth daily.   [name] loratadine (CLARITIN) 10 MG tablet Take 10 mg by mouth daily.  01/01/2017: Taking PRN  . losartan (COZAAR) 50 MG tablet Take 1 tablet (50 mg total) by mouth daily.   . meclizine (ANTIVERT) 25 MG tablet Take 1 tablet (25 mg total) by mouth 3 (three) times daily as needed for dizziness.   . metFORMIN  (GLUCOPHAGE) 500 MG tablet Take by mouth 2 (two) times daily with a meal.   . nitroGLYCERIN (NITROSTAT) 0.4 MG SL tablet Place 0.4 mg under the tongue every 5 (five) minutes as needed for chest pain. Reported on 10/04/2015 04/02/2014: .  . polyethylene glycol (MIRALAX / GLYCOLAX) packet Take 17 g by mouth daily as needed (constipation). Reported on 10/04/2015   . spironolactone (ALDACTONE) 50 MG tablet Take 0.5 tablets (25 mg total) by mouth daily. Please keep upcoming appt for future refills. Thank you   . TRUEPLUS INSULIN SYRINGE 31G X 5/16" 0.5 ML MISC    . ZETIA 10 MG tablet TAKE 1 TABLET BY MOUTH EVERY DAY   . [DISCONTINUED] atorvastatin (LIPITOR) 80 MG tablet TAKE 1 TABLET BY MOUTH ONCE DAILY.   . [DISCONTINUED] furosemide (LASIX) 20 MG tablet TAKE 1 TABLET BY MOUTH ONCE DAILY.    No facility-administered encounter medications on file as of 10/20/2017.     Functional Status:  In your present state of health, do you have any difficulty performing the following activities: 02/10/2017 01/15/2017  Hearing? N N  Vision? Y Y  Comment has cataract on right eye deciding on cataract surgery  Difficulty concentrating or making decisions? N Y  Walking or climbing stairs? N Y  Dressing or bathing? N N  Doing errands, shopping? N Y  Comment - husband does the Proofreader and eating ? N N  Using the Toilet? N N  In the past six months, have you accidently leaked urine? Y N  Do you have problems with loss of bowel control? N N  Managing your Medications? [place] following, now has compliance packaging   Managing your Finances? N N  Housekeeping or managing your Housekeeping? [name]  Comment friend assist with house cleaning has hired Chartered certified accountant once weekly   Some recent data might be hidden    Fall/Depression Screening: Fall Risk  10/20/2017 10/06/2017 09/29/2017  Falls in the past year? Yes Yes (No Data)  Comment - - denies any recent falls  Number falls in past  yr: 2 or more 2 or more -  Injury with Fall? No No -  Risk Factor Category  High Fall Risk - -  Risk for fall due to : History of fall(s);Mental status change;Medication side effect;Impaired balance/gait - -  Risk for fall due to: Comment - - -  Follow up Falls evaluation completed;Education provided;Falls prevention discussed - -   PHQ 2/9 Scores 09/29/2017 02/10/2017 01/15/2017 11/25/2016 11/19/2016 08/19/2016 06/25/2016  PHQ - 2 Score _0 0 2 6  PHQ- 9 Score _1 - - 5 12  Exception Documentation - - - - - - -   THN CM Care Plan Problem One     Most Recent Value  Care Plan Problem One  Knowledge deficiet related to diabetes self care management   Role Documenting the Problem One  [place] for Problem One  Active  THN Long Term Goal   Daughter will report a reduction of patient's  Hgb A1C by 1 point in the next 90 days.  THN Long Term Goal Start Date  10/20/17  THN Long Term Goal Met Date  10/20/17  Interventions for Problem One Long Term Goal  Current care plan and goals reviewed and discussed with daughter, discussed medications and medication compliance with daughter, encouraged daughter to discuss with her siblings private pay home health aide assistance to help with medication compliance, congrtulated daughter on her and her brother attending appointments with patient, encouraged daughter to continue to assist with medications and medical management at home with patient, discussed appropriate times for monitoring blood sugars and administoring insulin, developed outreach plan with daughter to contact her instead of patient to minimize patient's interactions per patient's request  THN CM Short Term Goal #1   Patient and daughter will report checking and recording blood sugars daily in the next 30 days.  THN CM Short Term Goal #1 Start Date  10/20/17  Interventions for Short Term Goal #1  discussed with daughter importance of monitoring blood sugars at least daily, at  dinnertime or bedtime prior to insulin administration, encouraged daughter to seek assistance from her siblings or home health aide to help manage patient's condition  THN CM Short Term Goal #2   Patient and daughter will report taking insulin daily in the next 30 days.  THN CM Short Term Goal #2 Start Date  10/20/17  Interventions for Short Term Goal #2  Discussed with daughter importance of taking insulin, discussed current Hgb A1C increasing related to noncompliance with insulin., discussed time consistency with insulin administration  THN CM Short Term Goal #3  Patient will attend therapy appointment for depression on 11/10/2017  [facility] CM Short Term Goal #3 Start Date  10/20/17  [facility] CM Short Term Goal #3 Met Date  10/20/17  Interventions for Short Tern Goal #3  [facility] Social Worker following to assist with arranging therapist, encouraged daughter to make sure patient attended therapy appointment, discussed with daughter how depression can be increasing noncompliance and other medical issues      Plan: RN Health Coach will make next monthly telephone outreach to patient's daughter in the month of August. [place] will send primary care provider Quarterly Update.   [place] [phone] [email or web address]

## 2017-10-27 ENCOUNTER — Encounter

## 2017-10-27 ENCOUNTER — Ambulatory Visit: Payer: PPO | Admitting: Cardiology

## 2017-10-27 ENCOUNTER — Encounter: Payer: Self-pay | Admitting: Cardiology

## 2017-10-27 ENCOUNTER — Encounter (INDEPENDENT_AMBULATORY_CARE_PROVIDER_SITE_OTHER): Payer: Self-pay

## 2017-10-27 VITALS — BP 120/64 | HR 65 | Ht 61.0 in | Wt 151.4 lb

## 2017-10-27 DIAGNOSIS — E7800 Pure hypercholesterolemia, unspecified: Secondary | ICD-10-CM | POA: Diagnosis not present

## 2017-10-27 DIAGNOSIS — I739 Peripheral vascular disease, unspecified: Secondary | ICD-10-CM

## 2017-10-27 DIAGNOSIS — I2510 Atherosclerotic heart disease of native coronary artery without angina pectoris: Secondary | ICD-10-CM | POA: Diagnosis not present

## 2017-10-27 DIAGNOSIS — I5032 Chronic diastolic (congestive) heart failure: Secondary | ICD-10-CM

## 2017-10-27 DIAGNOSIS — I10 Essential (primary) hypertension: Secondary | ICD-10-CM | POA: Diagnosis not present

## 2017-10-27 NOTE — Addendum Note (Signed)
Addended by: Aris Georgia, Navika Hoopes L on: 10/27/2017 03:01 PM   Modules accepted: Orders

## 2017-10-27 NOTE — Progress Notes (Signed)
Cardiology Office Note:    Date:  10/27/2017   ID:  Alyssa [name], DOB 09-Jul-1935, MRN [id number]  PCP:  Isaias Sakai, DO  Cardiologist:  No primary care provider on file.    Referring MD: Alonna Buckler*   Chief Complaint  Patient presents with  . Coronary Artery Disease  . Hypertension  . Hyperlipidemia    History of Present Illness:    Alyssa [name] is a 82 y.o. female with a hx of CAD (DES to LAD 2013, NSTEMI s/p DES to OM1 and DES to RCA 03/2014) , HTN, chronic diastolic CHF, GERD, hypothyroidism, DM and PVD.  Her PVD is followed by Dr. Doren Custard with noninvasive studies consistent with infrainguinal arterial occlusive disease bilaterally.  She has stable claudication with no rest pain and no nonhealing ulcers and therefore is on medical management.  She is here today for followup and is doing well.  She denies any chest pain or pressure, SOB, DOE, PND, orthopnea, LE edema, dizziness, palpitations or syncope.  She has had some problems with balance recently but she says it is more unsteady gait and not dizziness.  She is compliant with her meds and is tolerating meds with no SE.    Past Medical History:  Diagnosis Date  . Anemia   . CAD (coronary artery disease)    a. 2013 NSTEMI s/p DES to LAD. b. 04/03/14: NSTEMI s/p DES to OM1, DES to dRCA.  [name] Chronic diastolic CHF (congestive heart failure) ([place])    a. 2D ECHO 04/02/14 with hypokinesis in the basal inferior and inferoseptal walls. Focal and moderate concentric LVH. Overall LVEF 60-65%. G1DD. Elevated EDLV filling pressures. Mild TR.  . Diverticulosis   . GERD (gastroesophageal reflux disease)   . History of GI bleed    a. 2013: adm for ABL anemia. 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  [name] HLD (hyperlipidemia)   . HTN (hypertension)   . Hypothyroid   . Memory disorder 10/06/2017  . Peripheral vascular  disease ([place])   . Refusal of blood transfusions as patient is Jehovah's Witness 09-29-12  . Serrated polyp of colon 2013  . Type II diabetes mellitus ([place])     Past Surgical History:  Procedure Laterality Date  . CESAREAN SECTION  ~ 1961; 1966   plus 3 NVD  . COLONOSCOPY  02/27/2012   Procedure: COLONOSCOPY;  Surgeon: Lear Ng, MD;  Location[facility] ENDOSCOPY;  Service: Endoscopy;  Laterality: N/A;  . CORONARY ANGIOPLASTY WITH STENT PLACEMENT  05/2011; 04/03/2014  . ESOPHAGOGASTRODUODENOSCOPY  02/26/2012   Procedure: ESOPHAGOGASTRODUODENOSCOPY (EGD);  Surgeon: Lear Ng, MD;  Location: [facility] ENDOSCOPY;  Service: Endoscopy;  Laterality: N/A;  . LEFT HEART CATHETERIZATION WITH CORONARY ANGIOGRAM N/A 06/12/2011   Procedure: LEFT HEART CATHETERIZATION WITH CORONARY ANGIOGRAM;  Surgeon: Josue Hector, MD;  Location[facility] CATH LAB;  Service: Cardiovascular;  Laterality: N/A;  . LEFT HEART CATHETERIZATION WITH CORONARY ANGIOGRAM N/A 04/03/2014   Procedure: LEFT HEART CATHETERIZATION WITH CORONARY ANGIOGRAM;  Surgeon: Jettie Booze, MD;  Location: [facility] CATH LAB;  Service: Cardiovascular;  Laterality: N/A;  . TONSILLECTOMY    . VAGINAL HYSTERECTOMY      Current Medications: Current Meds  Medication Sig  . aspirin 81 MG chewable tablet Take 81 mg by mouth daily.  [name] atorvastatin (LIPITOR) 80 MG tablet Take 1 tablet (80 mg total) by mouth daily. Please keep upcoming appt with  Dr. Radford Pax before anymore refills. Thank you  . carvedilol (COREG) 3.125 MG tablet Take 1 tablet (3.125 mg total) by mouth 2 (two) times daily. Please keep upcoming appt in July before anymore refills. Thank you  . clopidogrel (PLAVIX) 75 MG tablet Take 1 tablet (75 mg total) by mouth daily. Please keep upcoming appt in July before anymore refills. Thank you  . Coenzyme Q10 (COQ10 PO) Take 1 capsule by mouth daily. Reported on 10/11/2015  . escitalopram (LEXAPRO) 10 MG tablet   . hydrOXYzine (VISTARIL) 25 MG  capsule 25 mg every 6 (six) hours as needed for itching.   [name] ibuprofen (ADVIL,MOTRIN) 200 MG tablet Take 400 mg by mouth every 6 (six) hours as needed for mild pain.  [name] insulin detemir (LEVEMIR) 100 UNIT/ML injection Inject 50 Units into the skin at bedtime.   [name] levocetirizine (XYZAL) 5 MG tablet Take 5 mg by mouth at bedtime.   [name] levothyroxine (SYNTHROID, LEVOTHROID) 125 MCG tablet Take 125 mcg by mouth daily.  . metFORMIN (GLUCOPHAGE) 500 MG tablet Take by mouth 2 (two) times daily with a meal.  . nitroGLYCERIN (NITROSTAT) 0.4 MG SL tablet Place 0.4 mg under the tongue every 5 (five) minutes as needed for chest pain. Reported on 10/04/2015  . polyethylene glycol (MIRALAX / GLYCOLAX) packet Take 17 g by mouth daily as needed (constipation). Reported on 10/04/2015  . spironolactone (ALDACTONE) 50 MG tablet Take 0.5 tablets (25 mg total) by mouth daily. Please keep upcoming appt for future refills. Thank you  . TRUEPLUS INSULIN SYRINGE 31G X 5/16" 0.5 ML MISC   . ZETIA 10 MG tablet TAKE 1 TABLET BY MOUTH EVERY DAY     Allergies:   Tetanus toxoid, adsorbed   Social History   Socioeconomic History  . Marital status: Married    Spouse name: Not on file  . Number of children: Not on file  . Years of education: Not on file  . Highest education level: Not on file  Occupational History  . Not on file  Social Needs  . Financial resource strain: Not on file  . Food insecurity:    Worry: Not on file    Inability: Not on file  . Transportation needs:    Medical: Not on file    Non-medical: Not on file  Tobacco Use  . Smoking status: Former Smoker    Packs/day: 1.50    Years: 16.00    Pack years: 24.00    Types: Cigarettes    Last attempt to quit: 03/15/1970    Years since quitting: 47.6  . Smokeless tobacco: Never Used  Substance and Sexual Activity  . Alcohol use: Yes    Alcohol/week: 1.8 - 2.4 oz    Types: 3 - 4 Standard drinks or equivalent per week    Comment: 04/03/2014 "glass of  wine or a beer 2-3 times/month"  . Drug use: No  . Sexual activity: Not Currently  Lifestyle  . Physical activity:    Days per week: Not on file    Minutes per session: Not on file  . Stress: Not on file  Relationships  . Social connections:    Talks on phone: Not on file    Gets together: Not on file    Attends religious service: Not on file    Active member of club or organization: Not on file    Attends meetings of clubs or organizations: Not on file    Relationship status: Not on file  Other Topics  Concern  . Not on file  Social History Narrative   Lives with husband   Caffeine use: Coffee daily   Right handed     Family History: The patient's family history includes Arthritis in her father; Heart disease in her mother; Hypertension in her unknown relative; Varicose Veins in her mother.  ROS:   Please see the history of present illness.    ROS  All other systems reviewed and negative.   EKGs/Labs/Other Studies Reviewed:    The following studies were reviewed today: none  EKG:  EKG is  ordered today.  The ekg ordered today demonstrates normal sinus rhythm at 65 bpm with low voltage QRS and nonspecific T wave abnormality  Recent Labs: 11/18/2016: BUN 12; Creatinine, Ser 0.72; Hemoglobin 13.5; Platelets 224; Potassium 4.2; Sodium 140   Recent Lipid Panel    Component Value Date/Time   CHOL 130 09/24/2015 1135   TRIG 202 (H) 09/24/2015 1135   HDL 43 (L) 09/24/2015 1135   CHOLHDL 3.0 09/24/2015 1135   VLDL 40 (H) 09/24/2015 1135   LDLCALC 47 09/24/2015 1135   LDLDIRECT 99.0 06/02/2014 1203    Physical Exam:    VS:  BP 120/64 (BP Location: Left Arm, Patient Position: Sitting, Cuff Size: Normal)   Pulse 65   Ht 5\' 1"  (1.549 m)   Wt 151 lb 6.4 oz (68.7 kg)   BMI 28.61 kg/m     Wt Readings from Last 3 Encounters:  10/27/17 151 lb 6.4 oz (68.7 kg)  10/06/17 153 lb (69.4 kg)  05/29/17 161 lb (73 kg)     GEN:  Well nourished, well developed in no acute  distress HEENT: Normal NECK: No JVD; No carotid bruits LYMPHATICS: No lymphadenopathy CARDIAC: RRR, no murmurs, rubs, gallops RESPIRATORY:  Clear to auscultation without rales, wheezing or rhonchi  ABDOMEN: Soft, non-tender, non-distended MUSCULOSKELETAL:  No edema; No deformity  SKIN: Warm and dry NEUROLOGIC:  Alert and oriented x 3 PSYCHIATRIC:  Normal affect   ASSESSMENT:    1. Coronary artery disease involving native coronary artery of native heart without angina pectoris   2. Essential hypertension   3. Peripheral vascular disease ([place])   4. Chronic diastolic CHF (congestive heart failure) ([place])   5. Pure hypercholesterolemia    PLAN:    In order of problems listed above:  1.  ASCAD - s/p DES to LAD 2013, NSTEMI s/p DES to OM1 and DES to RCA 03/2014.  She denies any anginal symptoms.  She will continue on aspirin 81 mg daily, Plavix 75 mg daily, carvedilol 3.125 mg twice daily and statin.  2.  Hypertension -her BP is well controlled on exam today.  She will continue on losartan 50 mg daily, carvedilol 3.125 mg twice daily, spironolactone 25 mg daily.  3.  PVD - followed by Dr. Doren Custard with noninvasive studies consistent with infrainguinal arterial occlusive disease bilaterally.  She has stable claudication with no rest pain and no nonhealing ulcers and therefore is on medical management.    4.  Chronic diastolic heart failure - she appears euvolemic on exam today.  Her weight is stable and she is actually lost 10 pounds since February.  She will continue on spironolactone 25 mg daily and Lasix 20 mg daily.  I will check a bmet.  5.  Hyperlipidemia -her LDL goal is less than 70.  Her LDL was 127 on 07/08/2016.  I will repeat an FLP and ALT.  She will continue on Zetia 10 mg daily and  atorvastatin 80 mg daily.   Medication Adjustments/Labs and Tests Ordered: Current medicines are reviewed at length with the patient today.  Concerns regarding medicines are outlined above.  No  orders of the defined types were placed in this encounter.  No orders of the defined types were placed in this encounter.   Signed, Fransico Him, MD  10/27/2017 2:42 PM    [facility]

## 2017-10-27 NOTE — Patient Instructions (Signed)
Medication Instructions:  Your physician recommends that you continue on your current medications as directed. Please refer to the Current Medication list given to you today.  Labwork: Your physician recommends that you return for lab work in: 1 week for fasting lipid and liver panel, and BMET.    Testing/Procedures: NONE  Follow-Up: Your physician wants you to follow-up in: 1 year with Dr. Radford Pax. You will receive a reminder letter in the mail two months in advance. If you don't receive a letter, please call our office to schedule the follow-up appointment.   If you need a refill on your cardiac medications before your next appointment, please call your pharmacy.

## 2017-11-04 ENCOUNTER — Other Ambulatory Visit: Payer: Self-pay

## 2017-11-04 DIAGNOSIS — I779 Disorder of arteries and arterioles, unspecified: Secondary | ICD-10-CM

## 2017-11-04 DIAGNOSIS — I739 Peripheral vascular disease, unspecified: Secondary | ICD-10-CM

## 2017-11-06 ENCOUNTER — Other Ambulatory Visit: Payer: Self-pay | Admitting: *Deleted

## 2017-11-06 NOTE — Patient Outreach (Signed)
[facility]) Care Management  11/06/2017  Alyssa [name] 1935-06-25 [id number]   CSW spoke with pt today who reports she is doing fair; her vertigo has flared and she is in the bed.  Pt confirms plans to see Marya Amsler, LCSW, counselor next week and is reminded to cancel if she cannot go. She also is encouraged to call her PCP about her vertigo if needed.  Pt denies further concerns or needs at this time. CSW has offered to follow up by phone later in the week after her appointment with Ms Kelton Pillar on 11/10/2017.   Eduard Clos, MSW, [place] Worker  [place] [phone]

## 2017-11-10 ENCOUNTER — Ambulatory Visit: Payer: PPO | Admitting: Licensed Clinical Social Worker

## 2017-11-10 ENCOUNTER — Ambulatory Visit: Payer: Self-pay | Admitting: Licensed Clinical Social Worker

## 2017-11-11 ENCOUNTER — Ambulatory Visit: Payer: Self-pay | Admitting: *Deleted

## 2017-11-12 ENCOUNTER — Other Ambulatory Visit: Payer: Self-pay | Admitting: *Deleted

## 2017-11-13 ENCOUNTER — Ambulatory Visit: Payer: Self-pay | Admitting: *Deleted

## 2017-11-16 ENCOUNTER — Other Ambulatory Visit: Payer: Self-pay | Admitting: *Deleted

## 2017-11-16 NOTE — Patient Outreach (Signed)
[facility]) Care Management  11/16/2017  Alyssa [name] 1935-05-28 [id number]   CSW made contact with pt today by phone. She admits to having some memory issues; "I've got some dementia". She reports her daughter [name] has been coming over each night to give her insulin shot. She was unable to attend the mental health therapy appointment arranged by CSW because she was sick and does not recall if she cancelled. Pt admits to depression as well; "marital problems with [place] not getting along". Pt gave CSW permission to call her daughter to discuss rescheduling the appointment with Marya Amsler, [facility].  CSW contacted pt's daughter, who states her mother was formally diagnosed by a Neurologist with dementia and that the pt "refused to take any  Medication" for the dementia. Per daughter, they will be seeing her PCP on 11/23/17. CSW encouraged daughter to address the dementia, RX concerns with PCP.  CSW provided contact # to [name] and will plan a f/u call after the PCP visit.    Eduard Clos, MSW, [place] Worker  [place] [phone]

## 2017-11-17 ENCOUNTER — Other Ambulatory Visit: Payer: Self-pay | Admitting: Cardiology

## 2017-11-19 NOTE — Telephone Encounter (Signed)
error 

## 2017-11-23 ENCOUNTER — Other Ambulatory Visit: Payer: Self-pay | Admitting: *Deleted

## 2017-11-23 ENCOUNTER — Encounter: Payer: Self-pay | Admitting: *Deleted

## 2017-11-23 DIAGNOSIS — J320 Chronic maxillary sinusitis: Secondary | ICD-10-CM | POA: Diagnosis not present

## 2017-11-23 DIAGNOSIS — H9202 Otalgia, left ear: Secondary | ICD-10-CM | POA: Diagnosis not present

## 2017-11-23 DIAGNOSIS — E1139 Type 2 diabetes mellitus with other diabetic ophthalmic complication: Secondary | ICD-10-CM | POA: Diagnosis not present

## 2017-11-23 DIAGNOSIS — F0280 Dementia in other diseases classified elsewhere without behavioral disturbance: Secondary | ICD-10-CM | POA: Diagnosis not present

## 2017-11-23 DIAGNOSIS — Z6827 Body mass index (BMI) 27.0-27.9, adult: Secondary | ICD-10-CM | POA: Diagnosis not present

## 2017-11-23 DIAGNOSIS — F329 Major depressive disorder, single episode, unspecified: Secondary | ICD-10-CM | POA: Diagnosis not present

## 2017-11-23 NOTE — Patient Outreach (Signed)
[name]) Care Management  11/23/2017  Alyssa [name] [birth year] [id number]   RN Health Coach Monthly Outreach  Referral Date:02/06/2017 Referral Source:[facility] [place] referral Reason for Referral:Continued Disease Management Education Insurance:Health Team Advantage   Outreach Attempt:  Successful telephone outreach to patient's daughter, [name] for monthly follow up (Release of Information on file).  HIPAA verified with daughter, [name].  [name] stating patient with continued increasing confusion and depression.  Daughter reports patient missed scheduled Psychiatrist appointment, but she has the information to reschedule appointment.  Daughter describes going to patient's home daily to check her blood sugar and administer insulin every night.  [name] reports blood sugars (non fasting) ave ranged between 60-120's; unable to do fasting blood sugars.  Stated patient missed her psychiatrist appointment and needs to reschedule.  Encouraged daughter to reschedule appointment to assist with depression.  Daughter reporting patient is now agreeable to have home health care assistance come to there home and assise her with her daily care.  Appointments:  Patient attended medical appointment today and has scheduled follow up appointment on 12/24/2017.  Daughter encouraged to reschedule Therapy appointment as soon as possible.  Plan:  RN Health Coach to make next monthly telephone outreach to patient's daughter in the month of September.   [place] [phone] [email or web address]

## 2017-11-24 ENCOUNTER — Other Ambulatory Visit: Payer: Self-pay | Admitting: *Deleted

## 2017-11-24 NOTE — Patient Outreach (Addendum)
[place] [facility]) Care Management  11/24/2017  Alyssa [name] [birth year] [id number]   CSW made contact with pt by phone today. She states she is feeling "little better" and feels depression is as well.  CSW inquired about her PCP visit; no concerns or RX changes.  Pt reports she has a mental health appointment set for later this month and is a planning to attend.  Pt denies any further CSW needs or concerns; CSW offered to touch base in 2-3 weeks for updates.   CSW wished pt a happy birthday; she will turn 58 on the 18th. "I  Don't usually celebrate my birthday".  Pt appreciative.   Eduard Clos, MSW, [place] Worker  [place] [phone]

## 2017-12-01 ENCOUNTER — Ambulatory Visit: Payer: Self-pay | Admitting: Family

## 2017-12-01 ENCOUNTER — Encounter (HOSPITAL_COMMUNITY): Payer: Self-pay

## 2017-12-02 ENCOUNTER — Ambulatory Visit: Payer: Self-pay | Admitting: Family

## 2017-12-02 ENCOUNTER — Encounter (HOSPITAL_COMMUNITY): Payer: Self-pay

## 2017-12-11 ENCOUNTER — Other Ambulatory Visit: Payer: Self-pay | Admitting: *Deleted

## 2017-12-11 NOTE — Patient Outreach (Signed)
[place] [facility]) Care Management  12/11/2017  ARNECIA ECTOR 1935-10-30 [id number]   CSW spoke with pt who is concerned about her reading abilities; says she has an appointment with an Therapist, art. She has not rescheduled her mental health appointment but states she plans to and has the number. "I have been doing some painting and have found that I am good at it and enjoy it".  CSW commended her for finding things she enjoys and also can be therapeutic and help with coping.   Pt agrees to CSW closing case at this time. Pt advised to contact us if needs arise. CSW will make PCP aware of plans as well as [facility] team.   Eduard Clos, MSW, [place] Worker  [place] [phone]

## 2017-12-21 ENCOUNTER — Other Ambulatory Visit: Payer: Self-pay | Admitting: Cardiology

## 2017-12-21 DIAGNOSIS — Z9181 History of falling: Secondary | ICD-10-CM | POA: Diagnosis not present

## 2017-12-21 DIAGNOSIS — H903 Sensorineural hearing loss, bilateral: Secondary | ICD-10-CM | POA: Diagnosis not present

## 2017-12-21 DIAGNOSIS — R2681 Unsteadiness on feet: Secondary | ICD-10-CM | POA: Diagnosis not present

## 2017-12-21 DIAGNOSIS — J342 Deviated nasal septum: Secondary | ICD-10-CM | POA: Diagnosis not present

## 2017-12-21 DIAGNOSIS — R42 Dizziness and giddiness: Secondary | ICD-10-CM | POA: Diagnosis not present

## 2017-12-22 NOTE — Telephone Encounter (Signed)
Pt's pharmacy is requesting a refill on furosemide, this medication was D/C, LOV Dr.Turner stated for pt to continue this medication. Please clarify if pt is still suppose to be taking this medication and if so please sent in new Rx for furosemide. Please address

## 2017-12-22 NOTE — Telephone Encounter (Signed)
Pt should be on Furosemide.  Ok to fill.

## 2017-12-24 DIAGNOSIS — R2681 Unsteadiness on feet: Secondary | ICD-10-CM | POA: Diagnosis not present

## 2017-12-24 DIAGNOSIS — R42 Dizziness and giddiness: Secondary | ICD-10-CM | POA: Diagnosis not present

## 2017-12-28 ENCOUNTER — Other Ambulatory Visit: Payer: Self-pay | Admitting: *Deleted

## 2017-12-28 ENCOUNTER — Encounter: Payer: Self-pay | Admitting: *Deleted

## 2017-12-28 NOTE — Patient Outreach (Signed)
[place] [facility]) Care Management  12/28/2017  Alyssa [name] [birth year] [id number]   RN Health Coach Monthly Outreach  Referral Date:02/06/2017 Referral Source:[facility] Community referral Reason for Referral:Continued Disease Management Education Insurance:Health Team Advantage   Outreach Attempt:  Successful telephone outreach to patient's daughter for monthly follow up.  HIPAA verified with daughter, Alyssa [name], Release of Information on file.  Daughter reporting patient doing some better, still very forgetful and is having vision problems.  Believes she has an eye appointment in October.  Alyssa [name] reports she continues to go to patient's house nightly to check blood sugars and administer insulin.  Reports blood sugars vary but typically range 200-250's after eating.  Alyssa [name] states patient is attending weekly painting classes and enjoying them.  Continues to be depressed and has rescheduled her therapy appointment.  She also states the family is trying to arrange for home health assistance and is awaiting a phone call from [facility] to set up.  Alyssa [name] stating patient is also participating in Outpatient Physical Therapy to help with mobility and gait.  Denies any recent falls.  Appointments:  Stating patient has scheduled appointment with primary care provider Dr. Vanetta Shawl on next Monday, January 04, 2018.  Also has scheduled therapy and eye appointments, she is not sure of exact dates at this time.  Plan: RN Health Coach will make next telephone outreach to patient in the month of October.  [place] [phone] [email or web address]

## 2017-12-29 ENCOUNTER — Ambulatory Visit: Payer: PPO | Admitting: Family

## 2017-12-29 ENCOUNTER — Ambulatory Visit (HOSPITAL_COMMUNITY): Payer: PPO | Attending: Vascular Surgery

## 2017-12-29 ENCOUNTER — Encounter: Payer: Self-pay | Admitting: Family

## 2018-01-01 DIAGNOSIS — H9193 Unspecified hearing loss, bilateral: Secondary | ICD-10-CM | POA: Diagnosis not present

## 2018-01-04 DIAGNOSIS — Z6828 Body mass index (BMI) 28.0-28.9, adult: Secondary | ICD-10-CM | POA: Diagnosis not present

## 2018-01-04 DIAGNOSIS — Z0000 Encounter for general adult medical examination without abnormal findings: Secondary | ICD-10-CM | POA: Diagnosis not present

## 2018-01-04 DIAGNOSIS — E785 Hyperlipidemia, unspecified: Secondary | ICD-10-CM | POA: Diagnosis not present

## 2018-01-04 DIAGNOSIS — E669 Obesity, unspecified: Secondary | ICD-10-CM | POA: Diagnosis not present

## 2018-01-04 DIAGNOSIS — Z136 Encounter for screening for cardiovascular disorders: Secondary | ICD-10-CM | POA: Diagnosis not present

## 2018-01-04 DIAGNOSIS — Z23 Encounter for immunization: Secondary | ICD-10-CM | POA: Diagnosis not present

## 2018-01-22 ENCOUNTER — Other Ambulatory Visit: Payer: Self-pay | Admitting: *Deleted

## 2018-01-22 NOTE — Patient Outreach (Signed)
[facility]) Care Management  01/22/2018  Alyssa [name] [birth year] [id number]   [place] Monthly Outreach  Referral Date:02/06/2017 Referral Source:[facility] Community referral Reason for Referral:Continued Disease Management Education Insurance:Health Team Advantage   Outreach Attempt:  Outreach attempt #1 to patient's daughter for monthly follow up. No answer. RN Health Coach left HIPAA compliant voicemail message along with contact information.  Plan:  RN Health Coach will make another outreach attempt within the month of October.  [name] [phone] [email or web address]

## 2018-02-08 ENCOUNTER — Other Ambulatory Visit: Payer: Self-pay | Admitting: *Deleted

## 2018-02-08 NOTE — Patient Outreach (Signed)
[name]) Care Management  02/08/2018  Alyssa [name] [birth year] [id number]   [name] Monthly Outreach  Referral Date:02/06/2017 Referral Source:[facility] Community referral Reason for Referral:Continued Disease Management Education Insurance:Health Team Advantage   Outreach Attempt:  Outreach attempt #2 to patient's daughter per patient request for monthly follow up. No answer. RN Health Coach left HIPAA compliant voicemail message along with contact information.  Plan:  RN Health Coach will make another outreach attempt within the month of November.  [place] Coach [phone] [name]

## 2018-02-09 DIAGNOSIS — M26622 Arthralgia of left temporomandibular joint: Secondary | ICD-10-CM | POA: Insufficient documentation

## 2018-02-09 DIAGNOSIS — H9202 Otalgia, left ear: Secondary | ICD-10-CM | POA: Insufficient documentation

## 2018-02-26 ENCOUNTER — Encounter: Payer: Self-pay | Admitting: *Deleted

## 2018-02-26 ENCOUNTER — Other Ambulatory Visit: Payer: Self-pay | Admitting: *Deleted

## 2018-02-26 NOTE — Patient Outreach (Signed)
[place] [facility]) Care Management  [place]  02/26/2018   Alyssa [name] [birth year] [id number]    [place] Monthly Outreach   Referral Date: 02/06/2017 Referral Source:  [place] referral Reason for Referral:  Continued Disease Management Education Insurance:  Health Team Advantage    Outreach Attempt:  Successful telephone outreach to patient's daughter for monthly follow up.  HIPAA verified with daughter, [name] (Release of Information on file).  Daughter states patient is doing some better, more sociable and willing to accept assistance.  Does report she continues to go to patient's house nightly to check her blood sugar and administer her insulin injection.  States her blood sugars have been ranging 260-390's, typically right after patient has eaten dinner.  Encouraged daughter to discuss blood sugar ranges with physician.  Daughter reports patient is taking her daily medication most days, still missing a few doses.  Still awaiting home health care assistance to be arranged. Encouraged daughter to contact home health agency for follow up.  Encounter Medications:  Outpatient Encounter Medications as of 02/26/2018  Medication Sig Note  . aspirin 81 MG chewable tablet Take 81 mg by mouth daily.   [name] atorvastatin (LIPITOR) 80 MG tablet TAKE 1 TABLET BY MOUTH ONCE DAILY.   . carvedilol (COREG) 3.125 MG tablet TAKE 1 TABLET BY MOUTH TWICE DAILY   . clopidogrel (PLAVIX) 75 MG tablet TAKE 1 TABLET BY MOUTH ONCE DAILY.   [name] Coenzyme Q10 (COQ10 PO) Take 1 capsule by mouth daily. Reported on 10/11/2015   . escitalopram (LEXAPRO) 10 MG tablet    . furosemide (LASIX) 20 MG tablet TAKE 1 TABLET BY MOUTH ONCE DAILY.   . hydrOXYzine (VISTARIL) 25 MG capsule 25 mg every 6 (six) hours as needed for itching.    [name] ibuprofen (ADVIL,MOTRIN) 200 MG tablet Take 400 mg by mouth every 6 (six) hours as needed for mild pain.   [name] insulin detemir (LEVEMIR) 100 UNIT/ML injection  Inject 50 Units into the skin at bedtime.  06/03/2017: Patient reports she is taking 60 units at bedtime  . levocetirizine (XYZAL) 5 MG tablet Take 5 mg by mouth at bedtime.  01/01/2017: Taking PRN  . levothyroxine (SYNTHROID, LEVOTHROID) 125 MCG tablet Take 125 mcg by mouth daily.   . metFORMIN (GLUCOPHAGE) 500 MG tablet Take by mouth 2 (two) times daily with a meal.   . nitroGLYCERIN (NITROSTAT) 0.4 MG SL tablet Place 0.4 mg under the tongue every 5 (five) minutes as needed for chest pain. Reported on 10/04/2015 04/02/2014: .  . polyethylene glycol (MIRALAX / GLYCOLAX) packet Take 17 g by mouth daily as needed (constipation). Reported on 10/04/2015   . spironolactone (ALDACTONE) 50 MG tablet Take 0.5 tablets (25 mg total) by mouth daily. Please keep upcoming appt for future refills. Thank you   . TRUEPLUS INSULIN SYRINGE 31G X 5/16" 0.5 ML MISC    . ZETIA 10 MG tablet TAKE 1 TABLET BY MOUTH EVERY DAY    No facility-administered encounter medications on file as of 02/26/2018.     Functional Status:  In your present state of health, do you have any difficulty performing the following activities: 12/28/2017  Hearing? N  Vision? Y  Comment right eye catarct and having trouble with vision, awaiting eye appointment in October  Difficulty concentrating or making decisions? Y  Comment very forgetfull, dementia  Walking or climbing stairs? N  Dressing or bathing? N  Doing errands, shopping? N  Preparing Food and eating ?  Y  Using the Toilet? N  In the past six months, have you accidently leaked urine? Y  Do you have problems with loss of bowel control? N  Managing your Medications? Y  Managing your Finances? Y  Housekeeping or managing your Housekeeping? Y  Some recent data might be hidden    Fall/Depression Screening: Fall Risk  02/26/2018 12/28/2017 10/20/2017  Falls in the past year? 1 Yes Yes  Comment No falls in the last 4 months per daughter no falls in the past few months per daughter -   Number falls in past yr: 1 2 or more 2 or more  Injury with Fall? 0 No No  Risk Factor Category  High Risk (2 or more Points) High Fall Risk High Fall Risk  Risk for fall due to : Impaired balance/gait;Impaired mobility;Impaired vision;Medication side effect;Mental status change;History of fall(s) - History of fall(s);Mental status change;Medication side effect;Impaired balance/gait  Risk for fall due to: Comment - - -  Follow up Education provided;Falls prevention discussed Falls evaluation completed;Education provided;Falls prevention discussed Falls evaluation completed;Education provided;Falls prevention discussed   PHQ 2/9 Scores 12/28/2017 09/29/2017 02/10/2017 01/15/2017 11/25/2016 11/19/2016 08/19/2016  PHQ - 2 Score - 6 3 4 2  0 2  PHQ- 9 Score - 18 5 7  - - 5  Exception Documentation Other- indicate reason in comment box - - - - - -  Not completed did not speak with patient, spoke with daughter [name] - - - - - -   [facility] CM Care Plan Problem One     Most Recent Value  Care Plan Problem One  Knowledge deficiet related to diabetes self care management   Role Documenting the Problem One  [place] for Problem One  Active  [facility] Long Term Goal   Daughter will report a reduction of patient's Hgb A1C by 1 point in the next 90 days.  [facility] Long Term Goal Start Date  02/26/18  Interventions for Problem One Long Term Goal  Encouraged daughter to attend next medical appointment to follow up on current Hgb A1C, applauded daughter for visiting patient daily to check blood sugar and administer insulin injection, reviewed current glucose ranges and encouraged daughter to speak with physician concerning ranges, encouraged medication compliance, discussed missed mental health appointment and sent [facility] Social Worker a message to followup with patient and daughter to reschedule mental health appointment  [facility] CM Short Term Goal #1   Daughter will report home health care has been arranged in the next 30  days.  [facility] CM Short Term Goal #1 Start Date  02/26/18  Interventions for Short Term Goal #1  Discussed home health assistance to assist with medication compliance and regulation of blood sugar monitoring, encouraged daughter to contact home health agency or physician to follow up with home health orders, discussed daughter needing assistance to manage patient's health  [facility] CM Short Term Goal #2   Daughter will report rescheduling missed eye appointment  [facility] CM Short Term Goal #2 Start Date  02/26/18  Interventions for Short Term Goal #2  Discussed with daughter missed eye exam appointment, encouraged daughter to reschedule appointment as soon as possible, discussed importance of eye exams in diabetics     Appointments:  Attended primary care appointment on 01/04/2018 and daughter not sure of follow up appointment.  Encouraged daughter to verify next PCP appointment and try to attend appointment with patient.  States patient missed her eye exam 2 days ago.  Encouraged daughter to  reschedule eye examination as soon as possible.  Also stating patient has not rescheduled missed mental health appointment from months ago.  RN Health Coach sent in basket message to [facility] Worker for assistance in arranging another mental health appointment.  Plan: RN Health Coach will send Quarterly Update to primary care provider. RN Health Coach will make next telephone outreach in the month of January.  [place] [phone] [email or web address] [email or web address]

## 2018-03-01 ENCOUNTER — Other Ambulatory Visit: Payer: Self-pay | Admitting: *Deleted

## 2018-03-01 NOTE — Patient Outreach (Signed)
[place]) Care Management  03/01/2018  Alyssa [name] 10/20/1935 [id number]   CSW received request from [place] requesting we assist with coordinating pt needs for mental health counseling. CSW contacted pt and confirmed her identity. CSW explained reason for call and she admits and desires to get back in with counseling. "I really liked Marya Amsler". Pt gave CSW permission to call her daughter, [name], to further discuss and assist.  CSW contacted [name] who also confirmed pt's identity. CSW provided her with contact info for [employer] as well as the website for linking. CSW is not active with this pt at this time and encouraged them to call me if further needs arise.    Eduard Clos, MSW, [place] Worker  [place] [phone]

## 2018-03-15 ENCOUNTER — Encounter: Payer: Self-pay | Admitting: Pharmacy Technician

## 2018-03-15 ENCOUNTER — Other Ambulatory Visit: Payer: Self-pay | Admitting: Pharmacy Technician

## 2018-03-15 NOTE — Patient Outreach (Signed)
[place] [facility]) Care Management  03/15/2018  Alyssa [name] July 05, 1935 [id number]   Successful call to patients daughter, [name], HIPAA identifiers verified. [name] states that she would like to start the 2020 patient assistance for mom's Zetia. Prepared patient portion to be mailed and provider portion to be mailed to Dr. Radford Pax.  Will follow up with patients daughter in 5-7 business days to confirm app has been received.  [name] Chana Bode [place] [facility] Certified Pharmacy Technician [place] Management Direct [phone]

## 2018-04-01 DIAGNOSIS — E119 Type 2 diabetes mellitus without complications: Secondary | ICD-10-CM | POA: Diagnosis not present

## 2018-04-01 DIAGNOSIS — E1139 Type 2 diabetes mellitus with other diabetic ophthalmic complication: Secondary | ICD-10-CM | POA: Diagnosis not present

## 2018-04-01 DIAGNOSIS — E039 Hypothyroidism, unspecified: Secondary | ICD-10-CM | POA: Diagnosis not present

## 2018-04-01 DIAGNOSIS — Z6829 Body mass index (BMI) 29.0-29.9, adult: Secondary | ICD-10-CM | POA: Diagnosis not present

## 2018-04-12 ENCOUNTER — Other Ambulatory Visit: Payer: Self-pay | Admitting: Pharmacy Technician

## 2018-04-12 NOTE — Patient Outreach (Signed)
[place] [facility]) Care Management  04/12/2018  Alyssa [name] 01/12/1936 [id number]    Successful call placed to patients daughter, Alyssa [name] regarding patient assistance application(s) for Zetia , HIPAA identifiers verified. Alyssa [name] states that her mom has not shown her the patient assistance application for [employer]. She has requested that I mail again but to her address so that she can make sure it's filled out. Will prepare application to be mailed.  Will follow up with Debra in 5-7 business days to confirm application has been received.   [name] Chana Bode [place] Certified Pharmacy Technician [place] Management Direct Dial:[phone]

## 2018-04-20 DIAGNOSIS — J019 Acute sinusitis, unspecified: Secondary | ICD-10-CM | POA: Diagnosis not present

## 2018-04-23 ENCOUNTER — Other Ambulatory Visit: Payer: Self-pay | Admitting: Pharmacy Technician

## 2018-04-23 NOTE — Patient Outreach (Signed)
[place] [facility]) Care Management  04/23/2018  Alyssa [name] [birth year] [id number]    Unsuccessful call #1 placed to patients daughter, [name] regarding patient assistance application(s) for Zetia , HIPAA compliant voicemail left.   Will make 2nd call attempt in 2-3 business days if call has not been returned.   [name] Chana Bode [place] Certified Pharmacy Technician [place] Management Direct Dial:[phone]

## 2018-04-27 ENCOUNTER — Ambulatory Visit: Payer: Self-pay | Admitting: Pharmacy Technician

## 2018-04-30 ENCOUNTER — Encounter: Payer: Self-pay | Admitting: *Deleted

## 2018-04-30 ENCOUNTER — Other Ambulatory Visit: Payer: Self-pay | Admitting: *Deleted

## 2018-04-30 NOTE — Patient Outreach (Signed)
[place] [facility]) Care Management  [place]  05/03/2018   KHADIJAH MASTRIANNI 1936/02/16 [id number]   [place] Every other Month Outreach   Referral Date: 02/06/2017 Referral Source:  [place] referral Reason for Referral:  Continued Disease Management Education Insurance:  Health Team Advantage   Outreach Attempt:  Successful telephone outreach to patient's daughter, [name] for follow up.  HIPAA verified with daughter, Release of Information on file.  Daughter stating patient is doing ok.  Continues to sleep late in the morning and some issues with taking her medications timely.  Daughter reports she continues to visit patient nightly around dinnertime to check blood sugar and administer insulin.  Last Hgb Aic was 8.6 on 04/01/2018.  Daughter states patient blood sugars continues to be 245-300's at the time she checks prior to insulin administration, but typically after patient has eaten dinner.  Concerned patient not eating throughout the day, as usually meals (dinner) is provided by family.  Interested in meals on wheels.  Discussed [place] Work referral and daughter verbally agrees.  Daughter continues to discuss with other family the need to have home health aide to help manage patient's care; concern is if they can afford to privately pay for this service.   Encounter Medications:  Outpatient Encounter Medications as of 04/30/2018  Medication Sig Note  . aspirin 81 MG chewable tablet Take 81 mg by mouth daily.   [name] atorvastatin (LIPITOR) 80 MG tablet TAKE 1 TABLET BY MOUTH ONCE DAILY.   . carvedilol (COREG) 3.125 MG tablet TAKE 1 TABLET BY MOUTH TWICE DAILY   . clopidogrel (PLAVIX) 75 MG tablet TAKE 1 TABLET BY MOUTH ONCE DAILY.   [name] Coenzyme Q10 (COQ10 PO) Take 1 capsule by mouth daily. Reported on 10/11/2015   . escitalopram (LEXAPRO) 10 MG tablet    . furosemide (LASIX) 20 MG tablet TAKE 1 TABLET BY MOUTH ONCE DAILY.   . hydrOXYzine (VISTARIL) 25 MG  capsule 25 mg every 6 (six) hours as needed for itching.    [name] ibuprofen (ADVIL,MOTRIN) 200 MG tablet Take 400 mg by mouth every 6 (six) hours as needed for mild pain.   [name] insulin detemir (LEVEMIR) 100 UNIT/ML injection Inject 50 Units into the skin at bedtime.  06/03/2017: Patient reports she is taking 60 units at bedtime  . levocetirizine (XYZAL) 5 MG tablet Take 5 mg by mouth at bedtime.  01/01/2017: Taking PRN  . levothyroxine (SYNTHROID, LEVOTHROID) 125 MCG tablet Take 125 mcg by mouth daily.   . metFORMIN (GLUCOPHAGE) 500 MG tablet Take by mouth 2 (two) times daily with a meal.   . nitroGLYCERIN (NITROSTAT) 0.4 MG SL tablet Place 0.4 mg under the tongue every 5 (five) minutes as needed for chest pain. Reported on 10/04/2015 04/02/2014: .  . polyethylene glycol (MIRALAX / GLYCOLAX) packet Take 17 g by mouth daily as needed (constipation). Reported on 10/04/2015   . spironolactone (ALDACTONE) 50 MG tablet Take 0.5 tablets (25 mg total) by mouth daily. Please keep upcoming appt for future refills. Thank you   . TRUEPLUS INSULIN SYRINGE 31G X 5/16" 0.5 ML MISC    . ZETIA 10 MG tablet TAKE 1 TABLET BY MOUTH EVERY DAY    No facility-administered encounter medications on file as of 04/30/2018.     Functional Status:  In your present state of health, do you have any difficulty performing the following activities: 12/28/2017  Hearing? N  Vision? Y  Comment right eye catarct and having trouble  with vision, awaiting eye appointment in October  Difficulty concentrating or making decisions? Y  Comment very forgetfull, dementia  Walking or climbing stairs? N  Dressing or bathing? N  Doing errands, shopping? N  Preparing Food and eating ? Y  Using the Toilet? N  In the past six months, have you accidently leaked urine? Y  Do you have problems with loss of bowel control? N  Managing your Medications? Y  Managing your Finances? Y  Housekeeping or managing your Housekeeping? Y  Some recent data might be  hidden    Fall/Depression Screening: Fall Risk  04/30/2018 02/26/2018 12/28/2017  Falls in the past year? 1 1 Yes  Comment no falls in the past 6 months per daughter No falls in the last 4 months per daughter no falls in the past few months per daughter  Number falls in past yr: 1 1 2  or more  Injury with Fall? 0 0 No  Risk Factor Category  High Risk (2 or more Points) High Risk (2 or more Points) High Fall Risk  Risk for fall due to : Medication side effect;History of fall(s);Impaired balance/gait;Impaired mobility;Impaired vision;Mental status change Impaired balance/gait;Impaired mobility;Impaired vision;Medication side effect;Mental status change;History of fall(s) -  Risk for fall due to: Comment - - -  Follow up Falls evaluation completed;Education provided;Falls prevention discussed Education provided;Falls prevention discussed Falls evaluation completed;Education provided;Falls prevention discussed   PHQ 2/9 Scores 12/28/2017 09/29/2017 02/10/2017 01/15/2017 11/25/2016 11/19/2016 08/19/2016  PHQ - 2 Score - 6 3 4 2  0 2  PHQ- 9 Score - 18 5 7  - - 5  Exception Documentation Other- indicate reason in comment box - - - - - -  Not completed did not speak with patient, spoke with daughter [name] - - - - - -   [facility] CM Care Plan Problem One     Most Recent Value  Care Plan Problem One  Knowledge deficiet related to diabetes self care management   Role Documenting the Problem One  [place] for Problem One  Active  [facility] Long Term Goal   Daughter will report a reduction of patient's Hgb A1C by 0.3 points in the next 90 days.  (8.6)  [facility] Long Term Goal Start Date  04/30/18  [facility] Long Term Goal Met Date  04/30/18  Interventions for Problem One Long Term Goal  Congratulated daughter on continued dedication of going to patient's house every night to administer insulin and monitor blood sugar, reviewed and discussed current care plan and goals with daughter, discussed healthier meal and drink  options for patient, encouraged continued discussion with patient, daughter and other siblings concerning home health aide assistance with medication compliance and household management, [facility] CSW referral placed for Meals on Wheels and possible home health assistance  [facility] CM Short Term Goal #1   Daughter will report home health care has been arranged in the next 30 days.  [facility] CM Short Term Goal #1 Start Date  02/26/18  [facility] CM Short Term Goal #2   Daughter will report rescheduling missed eye appointment  [facility] CM Short Term Goal #2 Start Date  04/30/18  Interventions for Short Term Goal #2  Discussed importance of diabetic eye examinations at least yearly with daughter, encouraged daughter to reschedule patient's eye examination, encouraged daughter to seek assistance from her siblings to make sure patient has transportation to new eye appointment     Appointments:  Per daughter patient is in the process of changing primary care  providers, as Dr. Vanetta Shawl has changed practices.  Last saw Tobie Lords PA at the practice on 04/20/2018.  States patient has upcoming dental and therapist appointments.  Encouraged daughter to reschedule eye examination.  Plan: RN Health Coach will place [facility] Social Work referral for assistance with Meals on Wheels and possible resources for home health aide. RN Health Coach will send primary care provider Quarterly Update. RN Health Coach will make next telephone outreach to daughter within the month of March.  [place] [phone] [email or web address]

## 2018-05-04 NOTE — Progress Notes (Deleted)
[facility]  PATIENT: Alyssa [name] DOB: 1935-09-22   REASON FOR VISIT: Follow-up for memory disturbance  HISTORY FROM:    HISTORY OF PRESENT ILLNESS: 6/25/19KW Alyssa [name] is a 83 year old right-handed white female with a history of a gradually progressive memory disturbance.  The patient has had troubles with her memory for at least one year, she comes in with her husband and her son.  The patient has also been using a cane for ambulation around the same time.  The patient has had increasing problems with short-term memory, difficulty with word finding, she will lose her train of thought with talking.  She has difficulty remembering names for people.  She has not been driving much, her husband has been concerned about safety with driving.  The patient does the finances some, and she has difficulty keeping up with her medications and appointments.  The patient is able to cook without difficulty.  She does report that she has some difficulty with insomnia, she denies any significant fatigue during the day.  She reports no numbness or weakness of the extremities, she does have vertigo at times and she has had an occasional fall associated with this.  The patient does have some incontinence of the bladder on occasion.  She reports no definite family history of memory problems.  She has undergone MRI of the brain within the last year that shows mild to moderate levels of small vessel ischemic changes and diffuse cortical atrophy is seen.  The patient is not on any medications for memory currently. REVIEW OF SYSTEMS: Full 14 system review of systems performed and notable only for those listed, all others are neg:  Constitutional: neg  Cardiovascular: neg Ear/Nose/Throat: neg  Skin: neg Eyes: neg Respiratory: neg Gastroitestinal: neg  Hematology/Lymphatic: neg  Endocrine: neg Musculoskeletal:neg Allergy/Immunology: neg Neurological: neg Psychiatric: neg Sleep :  neg   ALLERGIES: Allergies  Allergen Reactions  . Tetanus Toxoid, Adsorbed Swelling and Rash    HOME MEDICATIONS: Outpatient Medications Prior to Visit  Medication Sig Dispense Refill  . aspirin 81 MG chewable tablet Take 81 mg by mouth daily.    [name] atorvastatin (LIPITOR) 80 MG tablet TAKE 1 TABLET BY MOUTH ONCE DAILY. 90 tablet 3  . carvedilol (COREG) 3.125 MG tablet TAKE 1 TABLET BY MOUTH TWICE DAILY 180 tablet 3  . clopidogrel (PLAVIX) 75 MG tablet TAKE 1 TABLET BY MOUTH ONCE DAILY. 90 tablet 3  . Coenzyme Q10 (COQ10 PO) Take 1 capsule by mouth daily. Reported on 10/11/2015    . escitalopram (LEXAPRO) 10 MG tablet     . furosemide (LASIX) 20 MG tablet TAKE 1 TABLET BY MOUTH ONCE DAILY. 28 tablet 0  . hydrOXYzine (VISTARIL) 25 MG capsule 25 mg every 6 (six) hours as needed for itching.     [name] ibuprofen (ADVIL,MOTRIN) 200 MG tablet Take 400 mg by mouth every 6 (six) hours as needed for mild pain.    [name] insulin detemir (LEVEMIR) 100 UNIT/ML injection Inject 50 Units into the skin at bedtime.     [name] levocetirizine (XYZAL) 5 MG tablet Take 5 mg by mouth at bedtime.     [name] levothyroxine (SYNTHROID, LEVOTHROID) 125 MCG tablet Take 125 mcg by mouth daily.    . metFORMIN (GLUCOPHAGE) 500 MG tablet Take by mouth 2 (two) times daily with a meal.    . nitroGLYCERIN (NITROSTAT) 0.4 MG SL tablet Place 0.4 mg under the tongue every 5 (five) minutes as needed for chest pain. Reported on  10/04/2015    . polyethylene glycol (MIRALAX / GLYCOLAX) packet Take 17 g by mouth daily as needed (constipation). Reported on 10/04/2015    . spironolactone (ALDACTONE) 50 MG tablet Take 0.5 tablets (25 mg total) by mouth daily. Please keep upcoming appt for future refills. Thank you 15 tablet 1  . TRUEPLUS INSULIN SYRINGE 31G X 5/16" 0.5 ML MISC     . ZETIA 10 MG tablet TAKE 1 TABLET BY MOUTH EVERY DAY 30 tablet 6   No facility-administered medications prior to visit.     PAST MEDICAL HISTORY: Past Medical History:   Diagnosis Date  . Anemia   . CAD (coronary artery disease)    a. 2013 NSTEMI s/p DES to LAD. b. 04/03/14: NSTEMI s/p DES to OM1, DES to dRCA.  [name] Chronic diastolic CHF (congestive heart failure) ([place])    a. 2D ECHO 04/02/14 with hypokinesis in the basal inferior and inferoseptal walls. Focal and moderate concentric LVH. Overall LVEF 60-65%. G1DD. Elevated EDLV filling pressures. Mild TR.  . Diverticulosis   . GERD (gastroesophageal reflux disease)   . History of GI bleed    a. 2013: adm for ABL anemia. 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  [name] HLD (hyperlipidemia)   . HTN (hypertension)   . Hypothyroid   . Memory disorder 10/06/2017  . Peripheral vascular disease ([place])   . Refusal of blood transfusions as patient is Jehovah's Witness 09-29-12  . Serrated polyp of colon 2013  . Type II diabetes mellitus ([place])     PAST SURGICAL HISTORY: Past Surgical History:  Procedure Laterality Date  . CESAREAN SECTION  ~ 1961; 1966   plus 3 NVD  . COLONOSCOPY  02/27/2012   Procedure: COLONOSCOPY;  Surgeon: Lear Ng, MD;  Location: [facility] ENDOSCOPY;  Service: Endoscopy;  Laterality: N/A;  . CORONARY ANGIOPLASTY WITH STENT PLACEMENT  05/2011; 04/03/2014  . ESOPHAGOGASTRODUODENOSCOPY  02/26/2012   Procedure: ESOPHAGOGASTRODUODENOSCOPY (EGD);  Surgeon: Lear Ng, MD;  Location[facility] ENDOSCOPY;  Service: Endoscopy;  Laterality: N/A;  . LEFT HEART CATHETERIZATION WITH CORONARY ANGIOGRAM N/A 06/12/2011   Procedure: LEFT HEART CATHETERIZATION WITH CORONARY ANGIOGRAM;  Surgeon: Josue Hector, MD;  Location: [facility] CATH LAB;  Service: Cardiovascular;  Laterality: N/A;  . LEFT HEART CATHETERIZATION WITH CORONARY ANGIOGRAM N/A 04/03/2014   Procedure: LEFT HEART CATHETERIZATION WITH CORONARY ANGIOGRAM;  Surgeon: Jettie Booze, MD;  Location: [facility] C[facility] LAB;  Service: Cardiovascular;  Laterality: N/A;   . TONSILLECTOMY    . VAGINAL HYSTERECTOMY      FAMILY HISTORY: Family History  Problem Relation Age of Onset  . Arthritis Father   . Heart disease Mother   . Varicose Veins Mother   . Hypertension Other     SOCIAL HISTORY: Social History   Socioeconomic History  . Marital status: Married    Spouse name: Not on file  . Number of children: Not on file  . Years of education: Not on file  . Highest education level: Not on file  Occupational History  . Not on file  Social Needs  . Financial resource strain: Not on file  . Food insecurity:    Worry: Not on file    Inability: Not on file  . Transportation needs:    Medical: Not on file    Non-medical: Not on file  Tobacco Use  . Smoking status: Former Smoker    Packs/day: 1.50  Years: 16.00    Pack years: 24.00    Types: Cigarettes    Last attempt to quit: 03/15/1970    Years since quitting: 48.1  . Smokeless tobacco: Never Used  Substance and Sexual Activity  . Alcohol use: Yes    Alcohol/week: 3.0 - 4.0 standard drinks    Types: 3 - 4 Standard drinks or equivalent per week    Comment: 04/03/2014 "glass of wine or a beer 2-3 times/month"  . Drug use: No  . Sexual activity: Not Currently  Lifestyle  . Physical activity:    Days per week: Not on file    Minutes per session: Not on file  . Stress: Not on file  Relationships  . Social connections:    Talks on phone: Not on file    Gets together: Not on file    Attends religious service: Not on file    Active member of club or organization: Not on file    Attends meetings of clubs or organizations: Not on file    Relationship status: Not on file  . Intimate partner violence:    Fear of current or ex partner: Not on file    Emotionally abused: Not on file    Physically abused: Not on file    Forced sexual activity: Not on file  Other Topics Concern  . Not on file  Social History Narrative   Lives with husband   Caffeine use: Coffee daily   Right handed      PHYSICAL EXAM  There were no vitals filed for this visit. There is no height or weight on file to calculate BMI.  Generalized: Well developed, in no acute distress  Head: normocephalic and atraumatic,. Oropharynx benign  Neck: Supple, no carotid bruits  Cardiac: Regular rate rhythm, no murmur  Musculoskeletal: No deformity   Neurological examination   Mentation: Alert oriented to time, place, history taking. Attention span and concentration appropriate. Recent and remote memory intact.  Follows all commands speech and language fluent.   Cranial nerve II-XII: Fundoscopic exam reveals sharp disc margins.Pupils were equal round reactive to light extraocular movements were full, visual field were full on confrontational test. Facial sensation and strength were normal. hearing was intact to finger rubbing bilaterally. Uvula tongue midline. head turning and shoulder shrug were normal and symmetric.Tongue protrusion into cheek strength was normal. Motor: normal bulk and tone, full strength in the BUE, BLE, fine finger movements normal, no pronator drift. No focal weakness Sensory: normal and symmetric to light touch, pinprick, and  Vibration, proprioception  Coordination: finger-nose-finger, heel-to-shin bilaterally, no dysmetria Reflexes: Brachioradialis 2/2, biceps 2/2, triceps 2/2, patellar 2/2, Achilles 2/2, plantar responses were flexor bilaterally. Gait and Station: Rising up from seated position without assistance, normal stance,  moderate stride, good arm swing, smooth turning, able to perform tiptoe, and heel walking without difficulty. Tandem gait is steady  DIAGNOSTIC DATA (LABS, IMAGING, TESTING) - I reviewed patient records, labs, notes, testing and imaging myself where available.  Lab Results  Component Value Date   WBC 8.6 11/18/2016   HGB 13.5 11/18/2016   HCT 41.8 11/18/2016   MCV 82.8 11/18/2016   PLT 224 11/18/2016      Component Value Date/Time   NA 140  11/18/2016 1527   K 4.2 11/18/2016 1527   CL 106 11/18/2016 1527   CO2 25 11/18/2016 1527   GLUCOSE 126 (H) 11/18/2016 1527   BUN 12 11/18/2016 1527   CREATININE 0.72 11/18/2016 1527   CREATININE 0.66  09/24/2015 1135   CALCIUM 9.3 11/18/2016 1527   PROT 6.5 10/22/2016 1829   ALBUMIN 3.7 10/22/2016 1829   AST 28 10/22/2016 1829   ALT 36 10/22/2016 1829   ALKPHOS 83 10/22/2016 1829   BILITOT 0.6 10/22/2016 1829   GFRNONAA >60 11/18/2016 1527   GFRAA >60 11/18/2016 1527   Lab Results  Component Value Date   CHOL 130 09/24/2015   HDL 43 (L) 09/24/2015   LDLCALC 47 09/24/2015   LDLDIRECT 99.0 06/02/2014   TRIG 202 (H) 09/24/2015   CHOLHDL 3.0 09/24/2015   Lab Results  Component Value Date   HGBA1C 9.1 (H) 04/01/2014   Lab Results  Component Value Date   VITAMINB12 1,127 10/06/2017   Lab Results  Component Value Date   TSH 7.250 (H) 04/01/2014    ***  ASSESSMENT AND PLAN  83 y.o. year old female  has a past medical history of Anemia, CAD (coronary artery disease), Chronic diastolic CHF (congestive heart failure) ([place]), Diverticulosis, GERD (gastroesophageal reflux disease), History of GI bleed, HLD (hyperlipidemia), HTN (hypertension), Hypothyroid, Memory disorder (10/06/2017), Peripheral vascular disease ([place]), Refusal of blood transfusions as patient is Jehovah's Witness (09-29-12), Serrated polyp of colon (2013), and Type II diabetes mellitus ([place]). here with ***  Progressive memory disturbance  2.  Gait disturbance  MRI of the brain does show some degree of small vessel ischemic changes, and significant cortical atrophy.  I discussed the possibility of starting a medication for memory, the patient does not wish to consider this option at this time.  She will have blood work done today, we will follow the memory troubles over time.  Dennie Bible, [facility], [facility], APRN  [facility] [address], [place], [place] [address] [phone]

## 2018-05-05 ENCOUNTER — Telehealth: Payer: Self-pay

## 2018-05-05 ENCOUNTER — Ambulatory Visit: Payer: PPO | Admitting: Nurse Practitioner

## 2018-05-05 NOTE — Telephone Encounter (Signed)
Patient was a no call/no show for their appointment today.   

## 2018-05-06 ENCOUNTER — Other Ambulatory Visit: Payer: Self-pay

## 2018-05-06 ENCOUNTER — Encounter: Payer: Self-pay | Admitting: Nurse Practitioner

## 2018-05-06 NOTE — Patient Outreach (Signed)
[facility]) Care Management  05/06/2018  Alyssa [name] 12-29-1935 [id number]  BSW received referral from [place] regarding interest in meals on wheel's and caregiver resources. BSW attempted to contact the patient's daughter on today's date to conduct a community resource consult. Unfortunately, today's call was unsuccessful. BSW left  a HIPAA compliant voice message requesting a return call.   Plan: BSW will mail the patient an unsuccessful outreach letter. BSW will attempt outreach again within the next four business days.  Daneen Schick, BSW, CDP [facility] [phone]

## 2018-05-12 ENCOUNTER — Other Ambulatory Visit: Payer: Self-pay

## 2018-05-12 ENCOUNTER — Ambulatory Visit: Payer: Self-pay

## 2018-05-12 NOTE — Patient Outreach (Addendum)
[facility]) Care Management  05/12/2018  Alyssa [name] 18-May-1935 [id number]  Second unsuccessful outreach to the patient's daughter. BSW left a HIPAA compliant voice message requesting a return call. BSW will attempt a third and final outreach within the next four business days.  Daneen Schick, BSW, CDP [place] [facility] Social Worker [phone]  ADDENDUM  BSW received return call from [name], patient HIPAA identifiers confirmed. BSW introduced self and the reason for today's outreach, indicating the patient had been referred for assistance with meals on wheels and caregiver resources. It is reported the patient has dementia and lives with her spouse who still drives. The patient has 5 children who check on her often and assist with medication administration. It is reported the patient is diabetic and often does not eat as she should. The family is interested in meals on wheels to provide nutational support when they are at work.  BSW explained the meals on wheels program to [name] and the possibility of a waiting list. It is reported the patient is not in a crisis at this time and permission is given to this BSW for a meals on wheels referral. BSW explained that although the patients spouse meets the age requirement, considering he still drives he may not qualify for the program. BSW furhter explained that there is sometimes funding to provide meals for a primary caregiver and that this BSW would also request he receive meals under this program. [name] stated understanding.  [name] reports her father is a veteran and she plans to look into [place] benefits regarding caregiver resources. BSW encouraged [name] to outreach the [place] soon considering it is a lengthy process. It is reported the patient does not qualify for Medicaid. BSW discussed the in home caregiver program provided by [employer] ([facility]). BSW  explained that this program also has a wait list but once the patient receives services she would have a caregiver in the home a few days a week to assist with ADL's. At this time [name] does not wish to have the patient referred to this program. It is reported [name] will plan to speak with the patient this week to see if she is open to having a caregiver in the home.  Plan: BSW has sent a referral to [place] of meals on wheels. BSW will plan to outreach [name] in the next two weeks to confirm referral status and follow up on patient caregiver needs.  Daneen Schick, BSW, CDP [facility] [phone]

## 2018-05-14 ENCOUNTER — Ambulatory Visit: Payer: Self-pay

## 2018-05-18 ENCOUNTER — Ambulatory Visit: Payer: PPO | Admitting: Licensed Clinical Social Worker

## 2018-05-21 ENCOUNTER — Ambulatory Visit: Payer: Self-pay

## 2018-05-21 ENCOUNTER — Other Ambulatory Visit: Payer: Self-pay

## 2018-05-21 NOTE — Patient Outreach (Signed)
[facility]) Care Management  05/21/2018  Alyssa [name] 1935-08-30 [id number]   Medication Adherence call to [name] patient did not answer. HTA has a disconnected number and on Epic's telephone patient did not answer.   [facility] Management Direct Dial [phone]  Fax [phone] [email or web address]

## 2018-05-21 NOTE — Patient Outreach (Signed)
[facility]) Care Management  05/21/2018  ALEEYA VEITCH 12-18-1935 [id number]  Unsuccessful outreach to the patients daughter to follow up on status of mobile meals referral. BSW left a HIPAA compliant voice message requesting a return call. BSW will attempt contact within the next three business days.  Daneen Schick, BSW, CDP [facility] [phone]

## 2018-05-24 ENCOUNTER — Other Ambulatory Visit: Payer: Self-pay

## 2018-05-24 NOTE — Patient Outreach (Signed)
[facility]) Care Management  05/24/2018  DESERA GRAFFEO June 07, 1935 [id number]  Care coordination call placed to Nemiah Commander mobile meal director of [employer]. BSW left Ms. Donzetta Matters a voice message requesting a return call to confirm the patients referral has been received.  Daneen Schick, BSW, CDP [facility] [phone]

## 2018-05-25 ENCOUNTER — Other Ambulatory Visit: Payer: Self-pay

## 2018-05-25 ENCOUNTER — Ambulatory Visit: Payer: Self-pay

## 2018-05-25 NOTE — Patient Outreach (Signed)
[facility]) Care Management  05/25/2018  Alyssa [name] [birth year] [id number]  BSW spoke with patients daughter, Alyssa [name], to inform of patients status on the mobile meals wait list. BSW explained that there may be an opportunity for the patient to visit the [facility] with her spouse for the congregate lunch program while awaiting mobile meals. [name] declined this option stating the patients sleep habits are "erratic" and she often sleeps until 1:00 pm. [name] denies any other social work needs at this time. BSW will close discipline. BSW explained that if further social work needs arise to notify the patients [place] who will refer the patient to a Education officer, museum. [name] stated understanding.  Daneen Schick, BSW, CDP [facility] [phone]

## 2018-06-10 ENCOUNTER — Ambulatory Visit (INDEPENDENT_AMBULATORY_CARE_PROVIDER_SITE_OTHER): Payer: PPO | Admitting: Licensed Clinical Social Worker

## 2018-06-10 DIAGNOSIS — F331 Major depressive disorder, recurrent, moderate: Secondary | ICD-10-CM

## 2018-06-11 ENCOUNTER — Other Ambulatory Visit: Payer: Self-pay

## 2018-06-11 ENCOUNTER — Encounter (HOSPITAL_COMMUNITY): Payer: Self-pay | Admitting: Emergency Medicine

## 2018-06-11 ENCOUNTER — Emergency Department (HOSPITAL_COMMUNITY)
Admission: EM | Admit: 2018-06-11 | Discharge: 2018-06-11 | Disposition: A | Discharge status: Home / Self Care | Payer: PPO | Attending: Emergency Medicine | Admitting: Emergency Medicine

## 2018-06-11 ENCOUNTER — Emergency Department (HOSPITAL_COMMUNITY): Payer: PPO

## 2018-06-11 DIAGNOSIS — I5032 Chronic diastolic (congestive) heart failure: Secondary | ICD-10-CM | POA: Diagnosis not present

## 2018-06-11 DIAGNOSIS — E119 Type 2 diabetes mellitus without complications: Secondary | ICD-10-CM | POA: Insufficient documentation

## 2018-06-11 DIAGNOSIS — R079 Chest pain, unspecified: Secondary | ICD-10-CM | POA: Diagnosis not present

## 2018-06-11 DIAGNOSIS — Z79899 Other long term (current) drug therapy: Secondary | ICD-10-CM | POA: Diagnosis not present

## 2018-06-11 DIAGNOSIS — Z7902 Long term (current) use of antithrombotics/antiplatelets: Secondary | ICD-10-CM | POA: Diagnosis not present

## 2018-06-11 DIAGNOSIS — R0789 Other chest pain: Secondary | ICD-10-CM | POA: Diagnosis not present

## 2018-06-11 DIAGNOSIS — I110 Hypertensive heart disease with heart failure: Secondary | ICD-10-CM | POA: Diagnosis not present

## 2018-06-11 DIAGNOSIS — Z7982 Long term (current) use of aspirin: Secondary | ICD-10-CM | POA: Diagnosis not present

## 2018-06-11 DIAGNOSIS — I252 Old myocardial infarction: Secondary | ICD-10-CM | POA: Insufficient documentation

## 2018-06-11 DIAGNOSIS — I2510 Atherosclerotic heart disease of native coronary artery without angina pectoris: Secondary | ICD-10-CM | POA: Diagnosis not present

## 2018-06-11 DIAGNOSIS — E039 Hypothyroidism, unspecified: Secondary | ICD-10-CM | POA: Diagnosis not present

## 2018-06-11 DIAGNOSIS — I10 Essential (primary) hypertension: Secondary | ICD-10-CM | POA: Diagnosis not present

## 2018-06-11 DIAGNOSIS — Z87891 Personal history of nicotine dependence: Secondary | ICD-10-CM | POA: Diagnosis not present

## 2018-06-11 DIAGNOSIS — Z794 Long term (current) use of insulin: Secondary | ICD-10-CM | POA: Diagnosis not present

## 2018-06-11 DIAGNOSIS — R5383 Other fatigue: Secondary | ICD-10-CM | POA: Diagnosis not present

## 2018-06-11 LAB — COMPREHENSIVE METABOLIC PANEL
ALT: 25 U/L (ref 0–44)
ANION GAP: 10 (ref 5–15)
AST: 21 U/L (ref 15–41)
Albumin: 3.8 g/dL (ref 3.5–5.0)
Alkaline Phosphatase: 72 U/L (ref 38–126)
BUN: 14 mg/dL (ref 8–23)
CALCIUM: 8.9 mg/dL (ref 8.9–10.3)
CO2: 22 mmol/L (ref 22–32)
Chloride: 104 mmol/L (ref 98–111)
Creatinine, Ser: 1.13 mg/dL — ABNORMAL HIGH (ref 0.44–1.00)
GFR calc Af Amer: 53 mL/min — ABNORMAL LOW (ref >60)
GFR calc non Af Amer: 46 mL/min — ABNORMAL LOW (ref >60)
Glucose, Bld: 314 mg/dL — ABNORMAL HIGH (ref 70–99)
Potassium: 3.8 mmol/L (ref 3.5–5.1)
Sodium: 136 mmol/L (ref 135–145)
Total Bilirubin: 0.5 mg/dL (ref 0.3–1.2)
Total Protein: 7 g/dL (ref 6.5–8.1)

## 2018-06-11 LAB — CBC
HCT: 42.1 % (ref 36.0–46.0)
Hemoglobin: 13 g/dL (ref 12.0–15.0)
MCH: 26.1 pg (ref 26.0–34.0)
MCHC: 30.9 g/dL (ref 30.0–36.0)
MCV: 84.4 fL (ref 80.0–100.0)
Platelets: 265 10*3/uL (ref 150–400)
RBC: 4.99 MIL/uL (ref 3.87–5.11)
RDW: 13.8 % (ref 11.5–15.5)
WBC: 8.4 10*3/uL (ref 4.0–10.5)
nRBC: 0 % (ref 0.0–0.2)

## 2018-06-11 LAB — I-STAT TROPONIN, ED
Troponin i, poc: 0 ng/mL (ref 0.00–0.08)
Troponin i, poc: 0 ng/mL (ref 0.00–0.08)

## 2018-06-11 LAB — BRAIN NATRIURETIC PEPTIDE: B Natriuretic Peptide: 49.2 pg/mL (ref 0.0–100.0)

## 2018-06-11 MED ORDER — ACETAMINOPHEN 500 MG PO TABS
1000.0000 mg | ORAL_TABLET | Freq: Once | ORAL | Status: AC
  Administered 2018-06-11: 1000 mg via ORAL
  Filled 2018-06-11: qty 2

## 2018-06-11 NOTE — ED Triage Notes (Signed)
Pt coming from home by EMS with chest pain. Says that a dull pain on the left side under her breast began around 0030. Pt took 1 nitro and 324 of Aspirin with relieved the chest pain. No pain currently. Hx of 2 MI's in past (recent in 2018), and 2 stents placed. BP 172/82, HR 96, CBG 292.

## 2018-06-11 NOTE — ED Notes (Signed)
Patient verbalizes understanding of discharge instructions. Opportunity for questioning and answers were provided. Armband removed by staff, pt discharged from ED by wheelchair   

## 2018-06-11 NOTE — ED Provider Notes (Signed)
[facility] EMERGENCY DEPARTMENT Provider Note  CSN: [id number] Arrival date & time: 06/11/18 0141  Chief Complaint(s) Chest Pain  HPI Alyssa [name] is a 83 y.o. female   The history is provided by the patient.  Chest Pain  Pain location:  Substernal area Pain quality: sharp   Pain radiates to:  Does not radiate Pain severity:  Severe Onset quality:  Sudden Duration:  5 minutes Timing:  Constant Progression:  Resolved Chronicity:  New Context: at rest   Relieved by: self resolved. Worsened by:  Nothing Associated symptoms: fatigue   Associated symptoms: no abdominal pain, no anxiety, no back pain, no cough, no diaphoresis, no nausea, no shortness of breath and no vomiting    Patient reports that this pain was completely different than her prior heart attack pains.   Past Medical History Past Medical History:  Diagnosis Date  . Anemia   . CAD (coronary artery disease)    a. 2013 NSTEMI s/p DES to LAD. b. 04/03/14: NSTEMI s/p DES to OM1, DES to dRCA.  [name] Chronic diastolic CHF (congestive heart failure) ([place])    a. 2D ECHO 04/02/14 with hypokinesis in the basal inferior and inferoseptal walls. Focal and moderate concentric LVH. Overall LVEF 60-65%. G1DD. Elevated EDLV filling pressures. Mild TR.  . Diverticulosis   . GERD (gastroesophageal reflux disease)   . History of GI bleed    a. 2013: adm for ABL anemia. 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  [name] HLD (hyperlipidemia)   . HTN (hypertension)   . Hypothyroid   . Memory disorder 10/06/2017  . Peripheral vascular disease ([place])   . Refusal of blood transfusions as patient is Jehovah's Witness 09-29-12  . Serrated polyp of colon 2013  . Type II diabetes mellitus [facility])    Patient Active Problem List   Diagnosis Date Noted  . Memory disorder 10/06/2017  . Insulin dependent diabetes mellitus ([place])  11/19/2016  . Chest pain 11/18/2016  . Diabetes mellitus without complication ([place]) 16/60/6301  . Dysthymia 06/20/2014  . Hypertensive heart disease 04/11/2014  . Anemia   . History of GI bleed   . CAD (coronary artery disease)   . HLD (hyperlipidemia)   . Peripheral vascular disease ([place])   . Chronic diastolic CHF (congestive heart failure) ([place])   . Essential hypertension   . NSTEMI (non-ST elevated myocardial infarction) ([place]) 04/01/2014  . Refusal of blood transfusions as patient is Jehovah's Witness 09/29/2012  . Blood loss anemia 02/26/2012  . Melena 02/25/2012  . GERD (gastroesophageal reflux disease) 11/11/2011  . DM type 2 (diabetes mellitus, type 2) ([place]) 05/26/2011  . Hypothyroid    Home Medication(s) Prior to Admission medications   Medication Sig Start Date End Date Taking? Authorizing Provider  aspirin 81 MG chewable tablet Take 81 mg by mouth daily.    [provider]  atorvastatin (LIPITOR) 80 MG tablet TAKE 1 TABLET BY MOUTH ONCE DAILY. Patient not taking: Reported on 06/11/2018 12/22/17   Sueanne Margarita, MD  carvedilol (COREG) 3.125 MG tablet TAKE 1 TABLET BY MOUTH TWICE DAILY Patient taking differently: Take 3.125 mg by mouth.  11/17/17   Sueanne Margarita, MD  clopidogrel (PLAVIX) 75 MG tablet TAKE 1 TABLET BY MOUTH ONCE DAILY. 11/17/17   Sueanne Margarita, MD  Coenzyme Q10 (COQ10 PO) Take 1 capsule by mouth daily. Reported on 10/11/2015    [provider]  escitalopram (LEXAPRO) 10 MG tablet  02/23/17   [provider]  furosemide (LASIX) 20 MG tablet TAKE 1 TABLET BY MOUTH ONCE DAILY. 12/22/17   Sueanne Margarita, MD  hydrOXYzine (VISTARIL) 25 MG capsule 25 mg every 6 (six) hours as needed for itching.  02/18/16   [provider]  ibuprofen (ADVIL,MOTRIN) 200 MG tablet Take 400 mg by mouth every 6 (six) hours as needed for mild pain.    [provider]  insulin detemir (LEVEMIR) 100 UNIT/ML injection Inject 50 Units into the skin  at bedtime.     [provider]  LEVEMIR FLEXTOUCH 100 UNIT/ML Pen INJECT 50 UNITS SUBCUTANEOUS EVERY DAY AT BEDTIME 06/05/18   [provider]  levocetirizine (XYZAL) 5 MG tablet Take 5 mg by mouth at bedtime as needed for allergies.  07/24/16   [provider]  levothyroxine (SYNTHROID, LEVOTHROID) 125 MCG tablet Take 125 mcg by mouth daily. 07/05/15   [provider]  metFORMIN (GLUCOPHAGE) 500 MG tablet Take by mouth 2 (two) times daily with a meal.    [provider]  nitroGLYCERIN (NITROSTAT) 0.4 MG SL tablet Place 0.4 mg under the tongue every 5 (five) minutes as needed for chest pain. Reported on 10/04/2015    [provider]  polyethylene glycol (MIRALAX / GLYCOLAX) packet Take 17 g by mouth daily as needed (constipation). Reported on 10/04/2015    [provider]  spironolactone (ALDACTONE) 50 MG tablet Take 0.5 tablets (25 mg total) by mouth daily. Please keep upcoming appt for future refills. Thank you 07/02/17   Sueanne Margarita, MD  TRUEPLUS INSULIN SYRINGE 31G X 5/16" 0.5 ML MISC  12/31/16   [provider]  ZETIA 10 MG tablet TAKE 1 TABLET BY MOUTH EVERY DAY 03/05/15   Sueanne Margarita, MD                                                                                                                                    Past Surgical History Past Surgical History:  Procedure Laterality Date  . CESAREAN SECTION  ~ 1961; 1966   plus 3 NVD  . COLONOSCOPY  02/27/2012   Procedure: COLONOSCOPY;  Surgeon: Lear Ng, MD;  Location[facility] ENDOSCOPY;  Service: Endoscopy;  Laterality: N/A;  . CORONARY ANGIOPLASTY WITH STENT PLACEMENT  05/2011; 04/03/2014  . ESOPHAGOGASTRODUODENOSCOPY  02/26/2012   Procedure: ESOPHAGOGASTRODUODENOSCOPY (EGD);  Surgeon: Lear Ng, MD;  Location: [facility] ENDOSCOPY;  Service: Endoscopy;  Laterality: N/A;  . LEFT HEART CATHETERIZATION WITH CORONARY ANGIOGRAM N/A 06/12/2011   Procedure: LEFT  HEART CATHETERIZATION WITH CORONARY ANGIOGRAM;  Surgeon: Josue Hector, MD;  Location: [facility] CATH LAB;  Service: Cardiovascular;  Laterality: N/A;  . LEFT HEART CATHETERIZATION WITH CORONARY ANGIOGRAM N/A 04/03/2014   Procedure: LEFT HEART CATHETERIZATION WITH CORONARY ANGIOGRAM;  Surgeon: Jettie Booze, MD;  Location: [facility] C[facility] LAB;  Service: Cardiovascular;  Laterality: N/A;  .  TONSILLECTOMY    . VAGINAL HYSTERECTOMY     Family History Family History  Problem Relation Age of Onset  . Arthritis Father   . Heart disease Mother   . Varicose Veins Mother   . Hypertension Other     Social History Social History   Tobacco Use  . Smoking status: Former Smoker    Packs/day: 1.50    Years: 16.00    Pack years: 24.00    Types: Cigarettes    Last attempt to quit: 03/15/1970    Years since quitting: 48.2  . Smokeless tobacco: Never Used  Substance Use Topics  . Alcohol use: Yes    Alcohol/week: 3.0 - 4.0 standard drinks    Types: 3 - 4 Standard drinks or equivalent per week    Comment: 04/03/2014 "glass of wine or a beer 2-3 times/month"  . Drug use: No   Allergies Tetanus toxoid, adsorbed  Review of Systems Review of Systems  Constitutional: Positive for fatigue. Negative for diaphoresis.  Respiratory: Negative for cough and shortness of breath.   Cardiovascular: Positive for chest pain.  Gastrointestinal: Negative for abdominal pain, nausea and vomiting.  Musculoskeletal: Negative for back pain.   All other systems are reviewed and are negative for acute change except as noted in the HPI  Physical Exam Vital Signs  I have reviewed the triage vital signs BP (!) 151/64 (BP Location: Right Arm) Comment: Simultaneous filing. User may not have seen previous data.  Pulse 78 Comment: Simultaneous filing. User may not have seen previous data.  Temp 98 F (36.7 C) (Oral)   Resp 17 Comment: Simultaneous filing. User may not have seen previous data.  Ht 5\' 1"  (1.549 m)   Wt 72.6  kg   SpO2 98% Comment: Simultaneous filing. User may not have seen previous data.  BMI 30.23 kg/m   Physical Exam Vitals signs reviewed.  Constitutional:      General: She is not in acute distress.    Appearance: She is well-developed. She is not diaphoretic.  HENT:     Head: Normocephalic and atraumatic.     Nose: Nose normal.     Mouth/Throat:     Dentition: Abnormal dentition.  Eyes:     General: No scleral icterus.       Right eye: No discharge.        Left eye: No discharge.     Conjunctiva/sclera: Conjunctivae normal.     Pupils: Pupils are equal, round, and reactive to light.  Neck:     Musculoskeletal: Normal range of motion and neck supple.  Cardiovascular:     Rate and Rhythm: Normal rate and regular rhythm.     Heart sounds: No murmur. No friction rub. No gallop.   Pulmonary:     Effort: Pulmonary effort is normal. No respiratory distress.     Breath sounds: Normal breath sounds. No stridor. No rales.  Abdominal:     General: There is no distension.     Palpations: Abdomen is soft.     Tenderness: There is no abdominal tenderness.  Musculoskeletal:        General: No tenderness.  Skin:    General: Skin is warm and dry.     Findings: No erythema or rash.  Neurological:     Mental Status: She is alert and oriented to person, place, and time.     ED Results and Treatments Labs (all labs ordered are listed, but only abnormal results are displayed) Labs Reviewed  COMPREHENSIVE  METABOLIC PANEL - Abnormal; Notable for the following components:      Result Value   Glucose, Bld 314 (*)    Creatinine, Ser 1.13 (*)    GFR calc non Af Amer 46 (*)    GFR calc Af Amer 53 (*)    All other components within normal limits  CBC  BRAIN NATRIURETIC PEPTIDE  I-STAT TROPONIN, ED  I-STAT TROPONIN, ED                                                                                                                         EKG  EKG Interpretation  Date/Time:  Friday  June 11 2018 01:57:38 EST Ventricular Rate:  78 PR Interval:    QRS Duration: 95 QT Interval:  396 QTC Calculation: 452 R Axis:   43 Text Interpretation:  Sinus rhythm Low voltage, extremity and precordial leads No significant change since last tracing Confirmed by Addison Lank [phone]) on 06/11/2018 2:48:52 AM      Radiology Dg Chest 2 View  Result Date: 06/11/2018 CLINICAL DATA:  LEFT chest pain. EXAM: CHEST - 2 VIEW COMPARISON:  Chest radiograph April 30, 2017 FINDINGS: Cardiac silhouette is mildly enlarged and unchanged. Coronary artery calcification and/or stent. Mildly calcified aortic arch. Increased lung volumes without pleural effusion or focal consolidation. No pneumothorax. Scoliosis. IMPRESSION: 1. Mild cardiomegaly.  No acute pulmonary process. 2.  Aortic Atherosclerosis (ICD10-I70.0). Electronically Signed   By: Elon Alas M.D.   On: 06/11/2018 02:46   Pertinent labs & imaging results that were available during my care of the patient were reviewed by me and considered in my medical decision making (see chart for details).  Medications Ordered in ED Medications  acetaminophen (TYLENOL) tablet 1,000 mg (1,000 mg Oral Given 06/11/18 0411)                                                                                                                                    Procedures Procedures  (including critical care time)  Medical Decision Making / ED Course I have reviewed the nursing notes for this encounter and the patient's prior records (if available in EHR or on provided paperwork).    Atypical chest pain.  Now asymptomatic.  EKG without acute ischemic changes or evidence of pericarditis.  Initial troponin negative.  Low suspicion for ACS but given prior history will obtain a second troponin.   Low  suspicion for pulmonary embolism.  Presentation not classic for aortic dissection or esophageal perforation.  Chest x-ray without evidence suggestive of  pneumonia, pneumothorax, pneumomediastinum.  No abnormal contour of the mediastinum to suggest dissection. No evidence of acute injuries.  Delta troponin negative.  The patient appears reasonably screened and/or stabilized for discharge and I doubt any other medical condition or other [facility] requiring further screening, evaluation, or treatment in the ED at this time prior to discharge.  The patient is safe for discharge with strict return precautions.   Final Clinical Impression(s) / ED Diagnoses Final diagnoses:  Chest pain  Atypical chest pain    Disposition: Discharge  Condition: Good  I have discussed the results, Dx and Tx plan with the patient who expressed understanding and agree(s) with the plan. Discharge instructions discussed at great length. The patient was given strict return precautions who verbalized understanding of the instructions. No further questions at time of discharge.    ED Discharge Orders    None       Follow Up: Isaias Sakai, DO [place] [address] [phone]  Schedule an appointment as soon as possible for a visit  As needed  Sueanne Margarita, MD [address] [place] [address] [phone]  Schedule an appointment as soon as possible for a visit  As needed     This chart was dictated using voice recognition software.  Despite best efforts to proofread,  errors can occur which can change the documentation meaning.   Fatima Blank, MD 06/11/18 [phone]

## 2018-07-01 ENCOUNTER — Other Ambulatory Visit: Payer: Self-pay | Admitting: *Deleted

## 2018-07-01 ENCOUNTER — Encounter: Payer: Self-pay | Admitting: *Deleted

## 2018-07-01 NOTE — Patient Outreach (Signed)
[facility]) Care Management  [place]  07/01/2018   MARTESHA NIEDERMEIER 09-Dec-1935 [id number]   [place] Every other Month Outreach   Referral Date: 02/06/2017 Referral Source:  [place] referral Reason for Referral:  Continued Disease Management Education Insurance:  [employer]    Outreach Attempt:   Successful telephone outreach to daughter, [name] for follow up.  HIPAA verified with daughter, [name].  Daughter stating patient is doing ok.  States she went to the emergency room 06/11/2018 for chest pain.  Daughter stating patient was by herself and daughter was notified to come get her by staff later that night.  Reports dementia and forgetfulness is getting worse and she, daughter, is looking forward to her Neurologist appointment next week in hopes of talking her into taking medication for dementia.  Daughter continues to visit nightly to check blood sugars and administer insulins.  Reports blood sugars continue to run 250-300's after dinner when daughter arrives.  Does state patient has had some swelling and discoloration of her lower extremities; but patient missed appointment with primary care provider last Thursday.  Denies any swelling at this time.  Daughter stating she will try to reschedule and attend appointment.  Reports they are in the process of getting Meals on Wheels arranged.  Encounter Medications:  Outpatient Encounter Medications as of 07/01/2018  Medication Sig Note  . aspirin 81 MG chewable tablet Take 81 mg by mouth daily.   . carvedilol (COREG) 3.125 MG tablet TAKE 1 TABLET BY MOUTH TWICE DAILY (Patient taking differently: Take 3.125 mg by mouth. )   . clopidogrel (PLAVIX) 75 MG tablet TAKE 1 TABLET BY MOUTH ONCE DAILY.   [name] Coenzyme Q10 (COQ10 PO) Take 1 capsule by mouth daily. Reported on 10/11/2015   . escitalopram (LEXAPRO) 10 MG tablet    . furosemide (LASIX) 20 MG tablet TAKE 1 TABLET BY MOUTH ONCE DAILY.   .  hydrOXYzine (VISTARIL) 25 MG capsule 25 mg every 6 (six) hours as needed for itching.    [name] ibuprofen (ADVIL,MOTRIN) 200 MG tablet Take 400 mg by mouth every 6 (six) hours as needed for mild pain.   [name] insulin detemir (LEVEMIR) 100 UNIT/ML injection Inject 50 Units into the skin at bedtime.    [name] LEVEMIR FLEXTOUCH 100 UNIT/ML Pen INJECT 50 UNITS SUBCUTANEOUS EVERY DAY AT BEDTIME   . levocetirizine (XYZAL) 5 MG tablet Take 5 mg by mouth at bedtime as needed for allergies.    [name] levothyroxine (SYNTHROID, LEVOTHROID) 125 MCG tablet Take 125 mcg by mouth daily.   . metFORMIN (GLUCOPHAGE) 500 MG tablet Take by mouth 2 (two) times daily with a meal.   . nitroGLYCERIN (NITROSTAT) 0.4 MG SL tablet Place 0.4 mg under the tongue every 5 (five) minutes as needed for chest pain. Reported on 10/04/2015 04/02/2014: .  . polyethylene glycol (MIRALAX / GLYCOLAX) packet Take 17 g by mouth daily as needed (constipation). Reported on 10/04/2015   . spironolactone (ALDACTONE) 50 MG tablet Take 0.5 tablets (25 mg total) by mouth daily. Please keep upcoming appt for future refills. Thank you   . TRUEPLUS INSULIN SYRINGE 31G X 5/16" 0.5 ML MISC    . ZETIA 10 MG tablet TAKE 1 TABLET BY MOUTH EVERY DAY   . atorvastatin (LIPITOR) 80 MG tablet TAKE 1 TABLET BY MOUTH ONCE DAILY. (Patient not taking: Reported on 06/11/2018)    No facility-administered encounter medications on file as of 07/01/2018.     Functional Status:  In your present state of health, do you have any difficulty performing the following activities: 12/28/2017  Hearing? N  Vision? Y  Comment right eye catarct and having trouble with vision, awaiting eye appointment in October  Difficulty concentrating or making decisions? Y  Comment very forgetfull, dementia  Walking or climbing stairs? N  Dressing or bathing? N  Doing errands, shopping? N  Preparing Food and eating ? Y  Using the Toilet? N  In the past six months, have you accidently leaked urine? Y  Do  you have problems with loss of bowel control? N  Managing your Medications? Y  Managing your Finances? Y  Housekeeping or managing your Housekeeping? Y  Some recent data might be hidden    Fall/Depression Screening: Fall Risk  07/01/2018 04/30/2018 02/26/2018  Falls in the past year? 1 1 1   Comment no falls in the past 8 months per daughter no falls in the past 6 months per daughter No falls in the last 4 months per daughter  Number falls in past yr: 1 1 1   Injury with Fall? 0 0 0  Risk Factor Category  High Risk (2 or more Points) High Risk (2 or more Points) High Risk (2 or more Points)  Risk for fall due to : Medication side effect;Impaired balance/gait;Mental status change;Impaired mobility;Impaired vision;History of fall(s) Medication side effect;History of fall(s);Impaired balance/gait;Impaired mobility;Impaired vision;Mental status change Impaired balance/gait;Impaired mobility;Impaired vision;Medication side effect;Mental status change;History of fall(s)  Risk for fall due to: Comment - - -  Follow up Falls evaluation completed;Education provided;Falls prevention discussed Falls evaluation completed;Education provided;Falls prevention discussed Education provided;Falls prevention discussed   PHQ 2/9 Scores 12/28/2017 09/29/2017 02/10/2017 01/15/2017 11/25/2016 11/19/2016 08/19/2016  PHQ - 2 Score - 6 3 4 2  0 2  PHQ- 9 Score - 18 5 7  - - 5  Exception Documentation Other- indicate reason in comment box - - - - - -  Not completed did not speak with patient, spoke with daughter [name] - - - - - -   [facility] CM Care Plan Problem One     Most Recent Value  Care Plan Problem One  Knowledge deficiet related to diabetes self care management   Role Documenting the Problem One  [place] for Problem One  Active  [facility] Long Term Goal   Daughter will report a reduction of patient's Hgb A1C by 0.3 points in the next 90 days.  (8.6)  [facility] Long Term Goal Start Date  07/01/18  Interventions for  Problem One Long Term Goal  Reviewed medications and encouraged continued assistance with medication compliance for patient, encouraged daughter to attend neurologist appointment with patient on 07/06/2018, discussed continued elevated evening blood sugars, encouraged daughter to continue to administer insulin injections for patient in the evenings, encouraged to continue to discuss with patient and family possibility of providing home aid to assist with cooking, cleanign, and medication monitoring  [facility] CM Short Term Goal #1   Daughter will report rescheduling primary care missed appointment in the next 60 days.  [facility] CM Short Term Goal #1 Start Date  07/01/18  Interventions for Short Term Goal #1  Encouraged daughter to reschedule missed primary care appointment from last week, encouraged daughter to attend rescheduled appointment with patient, discussed reminding patient and husband of scheduled appointments the morning of the appointment,   [facility] CM Short Term Goal #2   Daughter will report rescheduling missed eye appointment  [facility] CM Short Term Goal #2 Start Date  07/01/18  Interventions for Short Term Goal #2  Discussed importance of yearly eye examinatins for diabetics, encouraged daughter to reschedule missed appointment     Appointments:  Last primary care provider appointment was 04/20/2018 and daughter needs to reschedule appointment.  Has scheduled counseling appointment with neurology on 4/12020.  Scheduled Neurology appointment on 07/06/2018.  Plan: RN Health Coach will send primary care provider quarterly outreach. RN Health Coach will make next telephone outreach to patient within the month of May.  [place] [phone] [email or web address]

## 2018-07-05 ENCOUNTER — Telehealth: Payer: Self-pay

## 2018-07-05 NOTE — Telephone Encounter (Signed)
Spoke with the patient's son Jon(DPR verified) and he gave verbal consent to doing a telephone visit with Judson Roch.   No new concerns at this time. No changes to her medications, surgical or medical history.

## 2018-07-06 ENCOUNTER — Other Ambulatory Visit: Payer: Self-pay

## 2018-07-06 ENCOUNTER — Encounter: Payer: Self-pay | Admitting: Neurology

## 2018-07-06 ENCOUNTER — Telehealth (INDEPENDENT_AMBULATORY_CARE_PROVIDER_SITE_OTHER): Payer: PPO | Admitting: Neurology

## 2018-07-06 ENCOUNTER — Encounter

## 2018-07-06 ENCOUNTER — Ambulatory Visit: Payer: PPO | Admitting: Nurse Practitioner

## 2018-07-06 DIAGNOSIS — R413 Other amnesia: Secondary | ICD-10-CM | POA: Diagnosis not present

## 2018-07-06 NOTE — Progress Notes (Signed)
Virtual Visit via Telephone Note  I connected with Alyssa [name] on 07/06/18 at  3:15 PM EDT by telephone and verified that I am speaking with the correct person using two identifiers.   I discussed the limitations, risks, security and privacy concerns of performing an evaluation and management service by telephone and the availability of in person appointments. I also discussed with the patient that there may be a patient responsible charge related to this service. The patient expressed understanding and agreed to proceed.   History of Present Illness: July 06, 2018 SS via telephone: Alyssa [name] is an 83 year old female who presents for follow-up for memory disturbance.  She had MRI of the brain in July 2018 showing moderate chronic small vessel ischemic disease and significant cortical atrophy.  At her last appointment she deferred starting memory medications, lab work evaluation was unremarkable.   I talked with Alyssa [name] and her son, Alyssa [name].  She lives at home with her husband, has 5 children, some live nearby.  She reports she does her own ADLs, does her own cooking.  In June 2019 her MMSE was 24/30.  She reports she no longer drives, she lost her license after she was unable to take the driving.  She reports she does manage her medications however her son reports she has some difficulty remembering to take her medicines, using a drug company to prepackage her medications.  She reports that she has a good appetite.  She reports that she is not very active, she likes to read.  Her son reports she does have some underlying depression, sometimes sleeps a lot during the day.  She and her husband are able to manage her finances.  She reports that her memory is stable, she has not noticed a change in her memory.  She does report she has some problems with her short-term memory, remembering people's names.  Her son reports they have seen a gradual decline in her memory, noticed that she will repeat the same  stories during the same visit.  They have noticed that she will lose things, misplace things.  She also has trouble remembering dates.  She denies any falls, reports some gait instability, uses a cane when she goes out.  When I called, she did remember that she had an appointment with the neurologist today.  She denies any new problems or concerns.  10/06/2017 Dr .Jannifer Franklin  Alyssa [name] is a 83 year old right-handed white female with a history of a gradually progressive memory disturbance.  The patient has had troubles with her memory for at least one year, she comes in with her husband and her son. The patient has also been using a cane for ambulation around the same time.  The patient has had increasing problems with short-term memory, difficulty with word finding, she will lose her train of thought with talking.  She has difficulty remembering names for people.  She has not been driving much, her husband has been concerned about safety with driving.  The patient does the finances some, and she has difficulty keeping up with her medications and appointments.  The patient is able to cook without difficulty.  She does report that she has some difficulty with insomnia, she denies any significant fatigue during the day.  She reports no numbness or weakness of the extremities, she does have vertigo at times and she has had an occasional fall associated with this.  The patient does have some incontinence of the bladder on occasion.  She reports  no definite family history of memory problems.  She has undergone MRI of the brain within the last year that shows mild to moderate levels of small vessel ischemic changes and diffuse cortical atrophy is seen.  The patient is not on any medications for memory currently.   Observations/Objective: Pleasant conversation, some difficulty remembering details, fairly good historian, history aligns with son  Assessment and Plan: 1.  Memory disturbance  Overall it appears that her  memory is fairly stable, has had a gradual decline according to her son.  She will repeat stories, loose/misplaced things.  in June 2019 her MMSE was 24/30.  At this time we will defer memory medications, we could consider this at the next visit, per son's request. We discussed the importance of her remaining as active as possible.  I advised that if her symptoms worsen or if she notes any new symptoms they should let us know.  Follow Up Instructions: She will follow-up in 6 months or sooner if needed for a memory test, revisit, discuss starting memory medicines   I discussed the assessment and treatment plan with the patient. The patient was provided an opportunity to ask questions and all were answered. The patient agreed with the plan and demonstrated an understanding of the instructions.   The patient was advised to call back or seek an in-person evaluation if the symptoms worsen or if the condition fails to improve as anticipated.  I provided 30 minutes of non-face-to-face time during this encounter.  This service was provided via telemedicine after obtaining patient consent The patient was located at home The provider was located at [facility] Office Referring provider established pt Names of people participating in the telemedicine service and their role:  -Patient:Alyssa [name], Alyssa [name]  -Provider: Butler Denmark, Laqueta Jean, DNP  -Nurse: Maryelizabeth Kaufmann, arranging telehealth services    Evangeline Dakin, [facility] [address] [place], [place] [address] [phone]

## 2018-07-06 NOTE — Progress Notes (Signed)
I have read the note, and I agree with the clinical assessment and plan.  Frances Joynt K Jahanna Raether   

## 2018-07-14 ENCOUNTER — Ambulatory Visit: Payer: PPO | Admitting: Licensed Clinical Social Worker

## 2018-07-22 DIAGNOSIS — E782 Mixed hyperlipidemia: Secondary | ICD-10-CM | POA: Diagnosis not present

## 2018-07-22 DIAGNOSIS — E039 Hypothyroidism, unspecified: Secondary | ICD-10-CM | POA: Diagnosis not present

## 2018-07-22 DIAGNOSIS — M75121 Complete rotator cuff tear or rupture of right shoulder, not specified as traumatic: Secondary | ICD-10-CM | POA: Diagnosis not present

## 2018-07-22 DIAGNOSIS — I2510 Atherosclerotic heart disease of native coronary artery without angina pectoris: Secondary | ICD-10-CM | POA: Diagnosis not present

## 2018-07-22 DIAGNOSIS — I10 Essential (primary) hypertension: Secondary | ICD-10-CM | POA: Diagnosis not present

## 2018-07-22 DIAGNOSIS — E1139 Type 2 diabetes mellitus with other diabetic ophthalmic complication: Secondary | ICD-10-CM | POA: Diagnosis not present

## 2018-07-22 DIAGNOSIS — M25511 Pain in right shoulder: Secondary | ICD-10-CM | POA: Diagnosis not present

## 2018-07-31 DIAGNOSIS — E1151 Type 2 diabetes mellitus with diabetic peripheral angiopathy without gangrene: Secondary | ICD-10-CM | POA: Diagnosis not present

## 2018-07-31 DIAGNOSIS — Z7984 Long term (current) use of oral hypoglycemic drugs: Secondary | ICD-10-CM | POA: Diagnosis not present

## 2018-07-31 DIAGNOSIS — Z87891 Personal history of nicotine dependence: Secondary | ICD-10-CM | POA: Diagnosis not present

## 2018-07-31 DIAGNOSIS — I110 Hypertensive heart disease with heart failure: Secondary | ICD-10-CM | POA: Diagnosis not present

## 2018-07-31 DIAGNOSIS — R10813 Right lower quadrant abdominal tenderness: Secondary | ICD-10-CM | POA: Diagnosis not present

## 2018-07-31 DIAGNOSIS — Z955 Presence of coronary angioplasty implant and graft: Secondary | ICD-10-CM | POA: Diagnosis not present

## 2018-07-31 DIAGNOSIS — I5032 Chronic diastolic (congestive) heart failure: Secondary | ICD-10-CM | POA: Diagnosis not present

## 2018-07-31 DIAGNOSIS — R197 Diarrhea, unspecified: Secondary | ICD-10-CM | POA: Diagnosis not present

## 2018-07-31 DIAGNOSIS — R42 Dizziness and giddiness: Secondary | ICD-10-CM | POA: Diagnosis not present

## 2018-07-31 DIAGNOSIS — Z90710 Acquired absence of both cervix and uterus: Secondary | ICD-10-CM | POA: Diagnosis not present

## 2018-07-31 DIAGNOSIS — I252 Old myocardial infarction: Secondary | ICD-10-CM | POA: Diagnosis not present

## 2018-07-31 DIAGNOSIS — Z7982 Long term (current) use of aspirin: Secondary | ICD-10-CM | POA: Diagnosis not present

## 2018-07-31 DIAGNOSIS — I2510 Atherosclerotic heart disease of native coronary artery without angina pectoris: Secondary | ICD-10-CM | POA: Diagnosis not present

## 2018-08-20 ENCOUNTER — Other Ambulatory Visit: Payer: Self-pay | Admitting: Pharmacy Technician

## 2018-08-20 NOTE — Patient Outreach (Signed)
[place] [facility]) Care Management  08/20/2018  MINELA BRIDGEWATER 1935/06/22 [id number]   Per last conversation with daughter, she had mailed back in patient portion of [employer] application for [place]. Still have not received documents at this point.   Will remove myself from care team.  [name]. Chana Bode Gilbert Certified Pharmacy Technician [place] Management Direct [phone]

## 2018-08-27 ENCOUNTER — Encounter: Payer: Self-pay | Admitting: *Deleted

## 2018-08-27 ENCOUNTER — Other Ambulatory Visit: Payer: Self-pay | Admitting: *Deleted

## 2018-08-27 NOTE — Patient Outreach (Signed)
[place] [facility]) Care Management  08/27/2018  JAMARIYAH JOHANNSEN Apr 19, 1935 [id number]   [place] Every other Month Outreach  Referral Date:02/06/2017 Referral Source:[facility] Community referral Reason for Referral:Continued Disease Management Education Insurance:[employer]   Outreach Attempt:  Successful telephone outreach to patient's daughter, [name] (per patient's previous request).  HIPAA verified with daughter.  Debra reporting patient still having a hard time with depression and memory.  States she herself has been laid off due to the Pandemic and it has allowed her to spend a little more time with the patient and attempt to better assist and over the patient's affairs.  Reports she continues to take patient's blood sugar in the evening just before administering daily insulin injections.  Verbalizes blood sugars continue to range 250-300's post dinner.  [name] stating patient continues to sleep most of the day and wakes up late afternoon.  Continues to receive prepackaged medications, but reports patient picks certain medications out of the packages to take.  Encouraged daughter to continue to support patient and encouraged her to take all her medications.  Also, encouraged daughter to speak with patient's physicians and therapist to request they discuss importance of medication compliance with patient.  Appointments:  Daughter reports patient has appointment with Dr. Laqueta Due on 08/31/2018 to establish new primary care.  Also has an appointment with therapist, Marya Amsler on 09/01/2018.  Encouraged daughter to attend appointments and discuss with providers lack of medication compliance in hopes they will encouraged patient to take medications.  Plan: RN Health Coach will make next telephone outreach to patient in the month of July. RN Health Coach will await sending quarterly update while patient is establishing new primary care provider.  [place] [phone] [email or web address]

## 2018-08-31 DIAGNOSIS — I10 Essential (primary) hypertension: Secondary | ICD-10-CM | POA: Diagnosis not present

## 2018-08-31 DIAGNOSIS — Z794 Long term (current) use of insulin: Secondary | ICD-10-CM | POA: Diagnosis not present

## 2018-08-31 DIAGNOSIS — Z79899 Other long term (current) drug therapy: Secondary | ICD-10-CM | POA: Diagnosis not present

## 2018-08-31 DIAGNOSIS — E1165 Type 2 diabetes mellitus with hyperglycemia: Secondary | ICD-10-CM | POA: Diagnosis not present

## 2018-08-31 DIAGNOSIS — H9202 Otalgia, left ear: Secondary | ICD-10-CM | POA: Diagnosis not present

## 2018-08-31 DIAGNOSIS — E785 Hyperlipidemia, unspecified: Secondary | ICD-10-CM | POA: Diagnosis not present

## 2018-08-31 DIAGNOSIS — M25562 Pain in left knee: Secondary | ICD-10-CM | POA: Diagnosis not present

## 2018-08-31 DIAGNOSIS — E039 Hypothyroidism, unspecified: Secondary | ICD-10-CM | POA: Diagnosis not present

## 2018-08-31 DIAGNOSIS — R079 Chest pain, unspecified: Secondary | ICD-10-CM | POA: Diagnosis not present

## 2018-08-31 DIAGNOSIS — M25561 Pain in right knee: Secondary | ICD-10-CM | POA: Diagnosis not present

## 2018-09-01 ENCOUNTER — Ambulatory Visit (INDEPENDENT_AMBULATORY_CARE_PROVIDER_SITE_OTHER): Payer: PPO | Admitting: Licensed Clinical Social Worker

## 2018-09-01 DIAGNOSIS — F324 Major depressive disorder, single episode, in partial remission: Secondary | ICD-10-CM | POA: Diagnosis not present

## 2018-09-03 DIAGNOSIS — M25762 Osteophyte, left knee: Secondary | ICD-10-CM | POA: Diagnosis not present

## 2018-09-03 DIAGNOSIS — M25562 Pain in left knee: Secondary | ICD-10-CM | POA: Diagnosis not present

## 2018-09-03 DIAGNOSIS — M25761 Osteophyte, right knee: Secondary | ICD-10-CM | POA: Diagnosis not present

## 2018-09-03 DIAGNOSIS — M25862 Other specified joint disorders, left knee: Secondary | ICD-10-CM | POA: Diagnosis not present

## 2018-09-03 DIAGNOSIS — M25861 Other specified joint disorders, right knee: Secondary | ICD-10-CM | POA: Diagnosis not present

## 2018-09-03 DIAGNOSIS — M25561 Pain in right knee: Secondary | ICD-10-CM | POA: Diagnosis not present

## 2018-09-14 ENCOUNTER — Ambulatory Visit (INDEPENDENT_AMBULATORY_CARE_PROVIDER_SITE_OTHER): Payer: PPO | Admitting: Licensed Clinical Social Worker

## 2018-09-14 DIAGNOSIS — F324 Major depressive disorder, single episode, in partial remission: Secondary | ICD-10-CM

## 2018-09-15 DIAGNOSIS — E1165 Type 2 diabetes mellitus with hyperglycemia: Secondary | ICD-10-CM | POA: Diagnosis not present

## 2018-09-15 DIAGNOSIS — E785 Hyperlipidemia, unspecified: Secondary | ICD-10-CM | POA: Diagnosis not present

## 2018-09-15 DIAGNOSIS — E039 Hypothyroidism, unspecified: Secondary | ICD-10-CM | POA: Diagnosis not present

## 2018-09-15 DIAGNOSIS — I998 Other disorder of circulatory system: Secondary | ICD-10-CM | POA: Diagnosis not present

## 2018-09-15 DIAGNOSIS — M79604 Pain in right leg: Secondary | ICD-10-CM | POA: Diagnosis not present

## 2018-09-15 DIAGNOSIS — I10 Essential (primary) hypertension: Secondary | ICD-10-CM | POA: Diagnosis not present

## 2018-09-15 DIAGNOSIS — M79605 Pain in left leg: Secondary | ICD-10-CM | POA: Diagnosis not present

## 2018-09-15 DIAGNOSIS — Z9114 Patient's other noncompliance with medication regimen: Secondary | ICD-10-CM | POA: Diagnosis not present

## 2018-09-15 DIAGNOSIS — Z6831 Body mass index (BMI) 31.0-31.9, adult: Secondary | ICD-10-CM | POA: Diagnosis not present

## 2018-09-20 DIAGNOSIS — E669 Obesity, unspecified: Secondary | ICD-10-CM | POA: Diagnosis not present

## 2018-09-20 DIAGNOSIS — M25552 Pain in left hip: Secondary | ICD-10-CM | POA: Diagnosis not present

## 2018-09-20 DIAGNOSIS — Z6832 Body mass index (BMI) 32.0-32.9, adult: Secondary | ICD-10-CM | POA: Diagnosis not present

## 2018-09-30 DIAGNOSIS — E119 Type 2 diabetes mellitus without complications: Secondary | ICD-10-CM | POA: Diagnosis not present

## 2018-09-30 DIAGNOSIS — I998 Other disorder of circulatory system: Secondary | ICD-10-CM | POA: Diagnosis not present

## 2018-09-30 DIAGNOSIS — M25552 Pain in left hip: Secondary | ICD-10-CM | POA: Diagnosis not present

## 2018-09-30 DIAGNOSIS — M79604 Pain in right leg: Secondary | ICD-10-CM | POA: Diagnosis not present

## 2018-09-30 DIAGNOSIS — I739 Peripheral vascular disease, unspecified: Secondary | ICD-10-CM | POA: Diagnosis not present

## 2018-09-30 DIAGNOSIS — M79605 Pain in left leg: Secondary | ICD-10-CM | POA: Diagnosis not present

## 2018-10-05 ENCOUNTER — Other Ambulatory Visit: Payer: Self-pay | Admitting: *Deleted

## 2018-10-05 NOTE — Patient Outreach (Signed)
[place] [facility]) Care Management  10/05/2018  Alyssa [name] [birth year] [id number]   Case Closure/Transition to [facility] Health/[facility]I  Referral Date:02/06/2017 Referral Source:[facility] Community referral Reason for Referral:Continued Disease Management Education Insurance:[employer]   Outreach:  Patient case has been transitioned to [facility] Health/CCI for further Care Management assistance.  Plan: RN Health Coach will close case at this time. RN Health Coach will send primary care provider Care Management Case Closure Letter. RN Health Coach will make patient inactive with [facility] Care Management at this time.  [place] Coach [phone] [name]

## 2018-10-12 DIAGNOSIS — I10 Essential (primary) hypertension: Secondary | ICD-10-CM | POA: Diagnosis not present

## 2018-10-12 DIAGNOSIS — E1165 Type 2 diabetes mellitus with hyperglycemia: Secondary | ICD-10-CM | POA: Diagnosis not present

## 2018-10-12 DIAGNOSIS — F329 Major depressive disorder, single episode, unspecified: Secondary | ICD-10-CM | POA: Diagnosis not present

## 2018-10-12 DIAGNOSIS — I739 Peripheral vascular disease, unspecified: Secondary | ICD-10-CM | POA: Diagnosis not present

## 2018-10-12 DIAGNOSIS — E039 Hypothyroidism, unspecified: Secondary | ICD-10-CM | POA: Diagnosis not present

## 2018-10-12 DIAGNOSIS — E785 Hyperlipidemia, unspecified: Secondary | ICD-10-CM | POA: Diagnosis not present

## 2018-10-12 DIAGNOSIS — Z6832 Body mass index (BMI) 32.0-32.9, adult: Secondary | ICD-10-CM | POA: Diagnosis not present

## 2018-10-14 ENCOUNTER — Other Ambulatory Visit: Payer: Self-pay

## 2018-10-14 ENCOUNTER — Telehealth (HOSPITAL_COMMUNITY): Payer: Self-pay

## 2018-10-14 DIAGNOSIS — I779 Disorder of arteries and arterioles, unspecified: Secondary | ICD-10-CM

## 2018-10-14 NOTE — Telephone Encounter (Signed)
The above patient or their representative was contacted and gave the following answers to these questions:         Do you have any of the following symptoms? No  Fever                    Cough                   Shortness of breath  Do  you have any of the following other symptoms?    muscle pain         vomiting,        diarrhea        rash         weakness        red eye        abdominal pain         bruising          bruising or bleeding              joint pain           severe headache    Have you been in contact with someone who was or has been sick in the past 2 weeks? No  Yes                 Unsure                         Unable to assess   Does the person that you were in contact with have any of the following symptoms?   Cough         shortness of breath           muscle pain         vomiting,            diarrhea            rash            weakness           fever            red eye           abdominal pain           bruising  or  bleeding                joint pain                severe headache               Have you  or someone you have been in contact with traveled internationally in th last month?  No        If yes, which countries?   Have you  or someone you have been in contact with traveled outside [place] in th last month? No         If yes, which state and city?   COMMENTS OR ACTION PLAN FOR THIS PATIENT:          

## 2018-10-18 ENCOUNTER — Other Ambulatory Visit: Payer: Self-pay

## 2018-10-18 ENCOUNTER — Ambulatory Visit (HOSPITAL_COMMUNITY)
Admission: RE | Admit: 2018-10-18 | Discharge: 2018-10-18 | Disposition: A | Discharge status: Home / Self Care | Payer: PPO | Source: Ambulatory Visit | Attending: Family | Admitting: Family

## 2018-10-18 DIAGNOSIS — I779 Disorder of arteries and arterioles, unspecified: Secondary | ICD-10-CM | POA: Insufficient documentation

## 2018-10-19 ENCOUNTER — Telehealth: Payer: Self-pay | Admitting: *Deleted

## 2018-10-19 NOTE — Telephone Encounter (Signed)
Virtual Visit Pre-Appointment Phone Call  Today, I spoke with Alyssa [name] daughter Alyssa [name] and performed the following actions:  1. I explained that we are currently trying to limit exposure to the COVID-19 virus by seeing patients at home rather than in the office.  I explained that the visits are best done by video, but can be done by telephone.  I asked the patient if a virtual visit that the patient would like to try instead of coming into the office. Alyssa [name] agreed to proceed with the virtual visit scheduled with Alyssa [name] on 09/20/18.        2. I confirmed the BEST phone number to call the day of the visit and- I included this in appointment notes.  3. I asked if the patient had access to (through a family member/friend) a smartphone with video capability to be used for her visit?"  The patient said yes -       4. I confirmed consent by  a. sending through [place] or by email the [place] as written at the end of this message or  b. verbally as listed below. i. This visit is being performed in the setting of COVID-19. ii. All virtual visits are billed to your insurance company just like a normal visit would be.   iii. We'd like you to understand that the technology does not allow for your provider to perform an examination, and thus may limit your provider's ability to fully assess your condition.  iv. If your provider identifies any concerns that need to be evaluated in person, we will make arrangements to do so.   v. Finally, though the technology is pretty good, we cannot assure that it will always work on either your or our end, and in the setting of a video visit, we may have to convert it to a phone-only visit.  In either situation, we cannot ensure that we have a secure connection.   vi. Are you willing to proceed?"  STAFF: Did the patient verbally acknowledge consent to telehealth visit? Document YES/NO here: YES  2. I  advised the patient to be prepared - I asked that the patient, on the day of her visit, record any information possible with the equipment at her home, such as blood pressure, pulse, oxygen saturation, and your weight and write them all down. I asked the patient to have a pen and paper handy nearby the day of the visit as well.  3. If the patient was scheduled for a video visit, I informed the patient that the visit with the doctor would start with a text to the smartphone # given to [place] by the patient.         If the patient was scheduled for a telephone call, I informed the patient that the visit with the doctor would start with a call to the telephone # given to [place] by the patient.  4. I Informed patient they will receive a phone call 15 minutes prior to their appointment time from a [place] or nurse to review medications, allergies, etc. to prepare for the visit.    TELEPHONE CALL NOTE  Alyssa [name] has been deemed a candidate for a follow-up tele-health visit to limit community exposure during the Covid-19 pandemic. I spoke with the patient via phone to ensure availability of phone/video source, confirm preferred email & phone number, and discuss instructions and expectations.  I reminded Alyssa [name]  All to be prepared with any vital sign and/or heart rhythm information that could potentially be obtained via home monitoring, at the time of her visit. I reminded Alyssa [name] to expect a phone call prior to her visit.  Alyssa [name], NT 10/19/2018 9:51 AM     FULL LENGTH CONSENT FOR TELE-HEALTH VISIT   I hereby voluntarily request, consent and authorize [facility] and its employed or contracted physicians, physician assistants, nurse practitioners or other licensed health care professionals (the Practitioner), to provide me with telemedicine health care services (the "Services") as deemed necessary by the treating Practitioner. I acknowledge and consent to receive the Services by the  Practitioner via telemedicine. I understand that the telemedicine visit will involve communicating with the Practitioner through live audiovisual communication technology and the disclosure of certain medical information by electronic transmission. I acknowledge that I have been given the opportunity to request an in-person assessment or other available alternative prior to the telemedicine visit and am voluntarily participating in the telemedicine visit.  I understand that I have the right to withhold or withdraw my consent to the use of telemedicine in the course of my care at any time, without affecting my right to future care or treatment, and that the Practitioner or I may terminate the telemedicine visit at any time. I understand that I have the right to inspect all information obtained and/or recorded in the course of the telemedicine visit and may receive copies of available information for a reasonable fee.  I understand that some of the potential risks of receiving the Services via telemedicine include:  [name] Delay or interruption in medical evaluation due to technological equipment failure or disruption; . Information transmitted may not be sufficient (e.g. poor resolution of images) to allow for appropriate medical decision making by the Practitioner; and/or  . In rare instances, security protocols could fail, causing a breach of personal health information.  Furthermore, I acknowledge that it is my responsibility to provide information about my medical history, conditions and care that is complete and accurate to the best of my ability. I acknowledge that Practitioner's advice, recommendations, and/or decision may be based on factors not within their control, such as incomplete or inaccurate data provided by me or distortions of diagnostic images or specimens that may result from electronic transmissions. I understand that the practice of medicine is not an exact science and that Practitioner makes  no warranties or guarantees regarding treatment outcomes. I acknowledge that I will receive a copy of this consent concurrently upon execution via email to the email address I last provided but may also request a printed copy by calling the office of [place].    I understand that my insurance will be billed for this visit.   I have read or had this consent read to me. . I understand the contents of this consent, which adequately explains the benefits and risks of the Services being provided via telemedicine.  . I have been provided ample opportunity to ask questions regarding this consent and the Services and have had my questions answered to my satisfaction. . I give my informed consent for the services to be provided through the use of telemedicine in my medical care  By participating in this telemedicine visit I agree to the above.

## 2018-10-20 ENCOUNTER — Encounter: Payer: Self-pay | Admitting: Family

## 2018-10-20 ENCOUNTER — Other Ambulatory Visit: Payer: Self-pay

## 2018-10-20 ENCOUNTER — Ambulatory Visit (INDEPENDENT_AMBULATORY_CARE_PROVIDER_SITE_OTHER): Payer: PPO | Admitting: Family

## 2018-10-20 VITALS — BP 120/74 | HR 74 | Temp 97.8°F | Ht 61.0 in | Wt 165.0 lb

## 2018-10-20 DIAGNOSIS — I998 Other disorder of circulatory system: Secondary | ICD-10-CM | POA: Diagnosis not present

## 2018-10-20 DIAGNOSIS — E1151 Type 2 diabetes mellitus with diabetic peripheral angiopathy without gangrene: Secondary | ICD-10-CM

## 2018-10-20 DIAGNOSIS — Z87891 Personal history of nicotine dependence: Secondary | ICD-10-CM | POA: Diagnosis not present

## 2018-10-20 DIAGNOSIS — I779 Disorder of arteries and arterioles, unspecified: Secondary | ICD-10-CM | POA: Diagnosis not present

## 2018-10-20 DIAGNOSIS — I70229 Atherosclerosis of native arteries of extremities with rest pain, unspecified extremity: Secondary | ICD-10-CM

## 2018-10-20 NOTE — Progress Notes (Signed)
Virtual Visit via Telephone Note  I connected with Alyssa [name] on 10/20/2018 using the [employer] by telephone and verified that I was speaking with the correct person using two identifiers. Patient was located at her home and accompanied by her daughter [name]. I am located at the [facility] office.   The limitations of evaluation and management by telemedicine and the availability of in person appointments have been previously discussed with the patient and are documented in the patients chart. The patient expressed understanding and consented to proceed.  PCP: Isaias Sakai, DO  Chief Complaint: worsening bilateral leg pain, peripheral artery occlusive disease   History of Present Illness: Alyssa [name] is a 83 y.o. female whom Dr. Scot Dock saw in consultation on 01/26/2015 with peripheral vascular disease. Based on her exam and noninvasive studies she had evidence of infrainguinal arterial occlusive disease bilaterally. However she had stable claudication with no rest pain and no nonhealing ulcers. Dr. Scot Dock recommended a one-year follow up visit.  She has a stationary bike, is no longer using; used to use it 30 minutes daily after one of her two MI's.  States no barriers to using her stationary bike.   Her legs hurt if she stands more than 5 minutes.  She denies any known back problems.  She denies any non healing wounds.  She wakes up at night with her feet feeling numb.  Her legs do not hurt when she is sitting.  She hangs her legs over her bed and couch to help her feet and legs feel better.  She denies any known history of stroke or TIA.   Dr. Scot Dock last evaluated pt on 05-31-16. At that time she had stable bilateral infrainguinal arterial occlusive disease. Her ABIs were slightly improved. She had an ABI 56% on the right with a toe pressure 46 mmHg. On the left ABI 74% with a toe pressure of 25. Dr. Scot Dock encouraged her to stay as active as possible. She is not a  smoker. She was on aspirin and is on Plavix. Given her leg swelling and evidence of some chronic venous insufficiency, Dr. Scot Dock discussed the importance of leg elevation.  She was to follow up Doppler study in one year.  She reports mild swelling in her legs.    Diabetic: Yes, last A1C result on file was 9.1 from 2015, states her home glucose checks at HS are about 160's, states she does not check very often Tobacco use: former smoker, quit in 1971, smoked x 16 years  Pt meds include: Statin :Yes Betablocker: Yes ASA: no, she does not remember that she needs to take Other anticoagulants/antiplatelets: Plavix   Past Medical History:  Diagnosis Date  . Anemia   . CAD (coronary artery disease)    a. 2013 NSTEMI s/p DES to LAD. b. 04/03/14: NSTEMI s/p DES to OM1, DES to dRCA.  [name] Chronic diastolic CHF (congestive heart failure) ([place])    a. 2D ECHO 04/02/14 with hypokinesis in the basal inferior and inferoseptal walls. Focal and moderate concentric LVH. Overall LVEF 60-65%. G1DD. Elevated EDLV filling pressures. Mild TR.  . Diverticulosis   . GERD (gastroesophageal reflux disease)   . History of GI bleed    a. 2013: adm for ABL anemia. 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  [name] HLD (hyperlipidemia)   . HTN (hypertension)   . Hypothyroid   . Memory disorder 10/06/2017  .  Peripheral vascular disease ([place])   . Refusal of blood transfusions as patient is Jehovah's Witness 09-29-12  . Serrated polyp of colon 2013  . Type II diabetes mellitus ([place])     Past Surgical History:  Procedure Laterality Date  . CESAREAN SECTION  ~ 1961; 1966   plus 3 NVD  . COLONOSCOPY  02/27/2012   Procedure: COLONOSCOPY;  Surgeon: Lear Ng, MD;  Location: [facility] ENDOSCOPY;  Service: Endoscopy;  Laterality: N/A;  . CORONARY ANGIOPLASTY WITH STENT PLACEMENT  05/2011; 04/03/2014  .  ESOPHAGOGASTRODUODENOSCOPY  02/26/2012   Procedure: ESOPHAGOGASTRODUODENOSCOPY (EGD);  Surgeon: Lear Ng, MD;  Location: [facility] ENDOSCOPY;  Service: Endoscopy;  Laterality: N/A;  . LEFT HEART CATHETERIZATION WITH CORONARY ANGIOGRAM N/A 06/12/2011   Procedure: LEFT HEART CATHETERIZATION WITH CORONARY ANGIOGRAM;  Surgeon: Josue Hector, MD;  Location: [facility] CATH LAB;  Service: Cardiovascular;  Laterality: N/A;  . LEFT HEART CATHETERIZATION WITH CORONARY ANGIOGRAM N/A 04/03/2014   Procedure: LEFT HEART CATHETERIZATION WITH CORONARY ANGIOGRAM;  Surgeon: Jettie Booze, MD;  Location: [facility] CATH LAB;  Service: Cardiovascular;  Laterality: N/A;  . TONSILLECTOMY    . VAGINAL HYSTERECTOMY      Current Meds  Medication Sig  . aspirin 81 MG chewable tablet Take 81 mg by mouth daily.  [name] atorvastatin (LIPITOR) 80 MG tablet TAKE 1 TABLET BY MOUTH ONCE DAILY.  . carvedilol (COREG) 3.125 MG tablet TAKE 1 TABLET BY MOUTH TWICE DAILY (Patient taking differently: Take 3.125 mg by mouth. )  . clopidogrel (PLAVIX) 75 MG tablet TAKE 1 TABLET BY MOUTH ONCE DAILY.  [name] Coenzyme Q10 (COQ10 PO) Take 1 capsule by mouth daily. Reported on 10/11/2015  . escitalopram (LEXAPRO) 10 MG tablet   . furosemide (LASIX) 20 MG tablet TAKE 1 TABLET BY MOUTH ONCE DAILY.  . hydrOXYzine (VISTARIL) 25 MG capsule 25 mg every 6 (six) hours as needed for itching.   [name] ibuprofen (ADVIL,MOTRIN) 200 MG tablet Take 400 mg by mouth every 6 (six) hours as needed for mild pain.  [name] insulin detemir (LEVEMIR) 100 UNIT/ML injection Inject 50 Units into the skin at bedtime.   [name] LEVEMIR FLEXTOUCH 100 UNIT/ML Pen INJECT 50 UNITS SUBCUTANEOUS EVERY DAY AT BEDTIME  . levocetirizine (XYZAL) 5 MG tablet Take 5 mg by mouth at bedtime as needed for allergies.   [name] levothyroxine (SYNTHROID, LEVOTHROID) 125 MCG tablet Take 125 mcg by mouth daily.  . metFORMIN (GLUCOPHAGE) 500 MG tablet Take by mouth 2 (two) times daily with a meal.  . nitroGLYCERIN  (NITROSTAT) 0.4 MG SL tablet Place 0.4 mg under the tongue every 5 (five) minutes as needed for chest pain. Reported on 10/04/2015  . polyethylene glycol (MIRALAX / GLYCOLAX) packet Take 17 g by mouth daily as needed (constipation). Reported on 10/04/2015  . spironolactone (ALDACTONE) 50 MG tablet Take 0.5 tablets (25 mg total) by mouth daily. Please keep upcoming appt for future refills. Thank you  . TRUEPLUS INSULIN SYRINGE 31G X 5/16" 0.5 ML MISC   . ZETIA 10 MG tablet TAKE 1 TABLET BY MOUTH EVERY DAY    12 system ROS was negative unless otherwise noted in HPI   Observations/Objective:  ABI (Date: 10-18-18): ABI Findings: +---------+------------------+-----+----------+--------+ Right    Rt Pressure (mmHg)IndexWaveform  Comment  +---------+------------------+-----+----------+--------+ Brachial 176                                       +---------+------------------+-----+----------+--------+  ATA      66                0.38 monophasic         +---------+------------------+-----+----------+--------+ PTA      66                0.38 monophasic         +---------+------------------+-----+----------+--------+ Great Toe28                0.16                    +---------+------------------+-----+----------+--------+  +---------+------------------+-----+----------+-------+ Left     Lt Pressure (mmHg)IndexWaveform  Comment +---------+------------------+-----+----------+-------+ Brachial 176                                      +---------+------------------+-----+----------+-------+ ATA      49                0.28 monophasic        +---------+------------------+-----+----------+-------+ PTA      36                0.20 monophasic        +---------+------------------+-----+----------+-------+ Great Toe22                0.12                   +---------+------------------+-----+----------+-------+   +-------+-----------+-----------+------------+------------+ ABI/TBIToday's ABIToday's TBIPrevious ABIPrevious TBI +-------+-----------+-----------+------------+------------+ Right  0.38       0.16       0.34                     +-------+-----------+-----------+------------+------------+ Left   0.28       0.12       0.49                     +-------+-----------+-----------+------------+------------+  Summary: Right: Resting right ankle-brachial index indicates severe right lower extremity arterial disease. The right toe-brachial index is abnormal. RT great toe pressure = 28 mmHg.  Left: Resting left ankle-brachial index indicates critical left limb ischemia. The left toe-brachial index is abnormal. LT Great toe pressure = 22 mmHg.   Assessment and Plan: She has bilateral leg pain after standing more than 5 minutes. She feels that this is worsening. She denies any sores on her feet or legs.  She hangs her legs over her bed and couch to help her feet and legs feel better. When I last examined her in February 2019 her femoral pulses were palpable and her right popliteal pulse was palpable.   ABI's from 10-18-18 show stable severe disease in the right and critical disease in the left.  Monophasic waveforms bilaterally. Toe pressure of 28 on the right, 22 on the left.   Follow Up Instructions:   Follow up in 1-2 weeks with bilateral LE arterial duplex and see Dr. Scot Dock.    I discussed the assessment and treatment plan with the patient. The patient was provided an opportunity to ask questions and all were answered. The patient agreed with the plan and demonstrated an understanding of the instructions.   The patient was advised to call back or seek an in-person evaluation if the symptoms worsen or if the condition fails to improve as anticipated.  I spent 15 minutes with the patient and her daughter via telephone encounter.  Gabrielle Dare [facility] [place] Office: [phone]  10/20/2018, 4:51 PM

## 2018-10-22 ENCOUNTER — Ambulatory Visit: Payer: PPO | Admitting: *Deleted

## 2018-10-28 ENCOUNTER — Other Ambulatory Visit: Payer: Self-pay

## 2018-10-28 DIAGNOSIS — I779 Disorder of arteries and arterioles, unspecified: Secondary | ICD-10-CM

## 2018-10-28 DIAGNOSIS — I70229 Atherosclerosis of native arteries of extremities with rest pain, unspecified extremity: Secondary | ICD-10-CM

## 2018-10-28 DIAGNOSIS — I998 Other disorder of circulatory system: Secondary | ICD-10-CM

## 2018-11-02 ENCOUNTER — Telehealth (HOSPITAL_COMMUNITY): Payer: Self-pay | Admitting: *Deleted

## 2018-11-02 NOTE — Telephone Encounter (Signed)
The above patient or their representative was contacted and gave the following answers to these questions:         Do you have any of the following symptoms?n  Fever                    Cough                   Shortness of breath  Do  you have any of the following other symptoms? n   muscle pain         vomiting,        diarrhea        rash         weakness        red eye        abdominal pain         bruising          bruising or bleeding              joint pain           severe headache    Have you been in contact with someone who was or has been sick in the past 2 weeks?n  Yes                 Unsure                         Unable to assess   Does the person that you were in contact with have any of the following symptoms?   Cough         shortness of breath           muscle pain         vomiting,            diarrhea            rash            weakness           fever            red eye           abdominal pain           bruising  or  bleeding                joint pain                severe headache               Have you  or someone you have been in contact with traveled internationally in th last month?   n      If yes, which countries?   Have you  or someone you have been in contact with traveled outside [place] in th last month?   n      If yes, which state and city?   COMMENTS OR ACTION PLAN FOR THIS PATIENT:         

## 2018-11-03 ENCOUNTER — Ambulatory Visit (INDEPENDENT_AMBULATORY_CARE_PROVIDER_SITE_OTHER): Payer: PPO | Admitting: Vascular Surgery

## 2018-11-03 ENCOUNTER — Encounter: Payer: Self-pay | Admitting: Vascular Surgery

## 2018-11-03 ENCOUNTER — Ambulatory Visit (HOSPITAL_COMMUNITY)
Admission: RE | Admit: 2018-11-03 | Discharge: 2018-11-03 | Disposition: A | Discharge status: Home / Self Care | Payer: PPO | Source: Ambulatory Visit | Attending: Vascular Surgery | Admitting: Vascular Surgery

## 2018-11-03 ENCOUNTER — Other Ambulatory Visit: Payer: Self-pay

## 2018-11-03 VITALS — BP 138/60 | HR 64 | Temp 97.9°F | Resp 20 | Ht 61.0 in | Wt 154.0 lb

## 2018-11-03 DIAGNOSIS — I998 Other disorder of circulatory system: Secondary | ICD-10-CM | POA: Diagnosis not present

## 2018-11-03 DIAGNOSIS — I739 Peripheral vascular disease, unspecified: Secondary | ICD-10-CM | POA: Diagnosis not present

## 2018-11-03 DIAGNOSIS — I779 Disorder of arteries and arterioles, unspecified: Secondary | ICD-10-CM | POA: Diagnosis not present

## 2018-11-03 DIAGNOSIS — I70229 Atherosclerosis of native arteries of extremities with rest pain, unspecified extremity: Secondary | ICD-10-CM

## 2018-11-03 NOTE — Progress Notes (Signed)
Patient name: Alyssa [name] MRN: [id number] DOB: [birth year] Sex: female  REASON FOR VISIT:   Follow-up of peripheral vascular disease.  HPI:   Alyssa [name] is a pleasant 83 y.o. female Seen in the past with peripheral vascular disease.  I last saw her on 05/21/2016.  Thus this was 2-1/2 years ago.  At that time, she had evidence of infrainguinal arterial occlusive disease bilaterally.  However she had stable claudication without rest pain or nonhealing ulcers and I recommended conservative treatment.  The patient had a virtual visit with the nurse practitioner on 10/20/2018.  She was complaining of worsening bilateral leg pain.  For this reason it was recommended that she come in for noninvasive studies and a follow-up visit with me.  On my history, the patient describes knee pain on the left.  She does get some left calf claudication but her activity is fairly limited.  She denies any history of rest pain or nonhealing ulcers.  She said no history of DVT.  Risk factors for peripheral vascular disease include diabetes and a remote history of tobacco use.  She quit in the 1970s.  She denies any history of hypertension or hypercholesterolemia.  She does have a history of coronary artery disease.  Past Medical History:  Diagnosis Date  . Anemia   . CAD (coronary artery disease)    a. 2013 NSTEMI s/p DES to LAD. b. 04/03/14: NSTEMI s/p DES to OM1, DES to dRCA.  [name] Chronic diastolic CHF (congestive heart failure) ([place])    a. 2D ECHO 04/02/14 with hypokinesis in the basal inferior and inferoseptal walls. Focal and moderate concentric LVH. Overall LVEF 60-65%. G1DD. Elevated EDLV filling pressures. Mild TR.  . Diverticulosis   . GERD (gastroesophageal reflux disease)   . History of GI bleed    a. 2013: adm for ABL anemia. 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  [name] HLD (hyperlipidemia)   . HTN  (hypertension)   . Hypothyroid   . Memory disorder 10/06/2017  . Peripheral vascular disease ([place])   . Refusal of blood transfusions as patient is Jehovah's Witness 09-29-12  . Serrated polyp of colon 2013  . Type II diabetes mellitus ([facility])     Family History  Problem Relation Age of Onset  . Arthritis Father   . Heart disease Mother   . Varicose Veins Mother   . Hypertension Other     SOCIAL HISTORY: Social History   Tobacco Use  . Smoking status: Former Smoker    Packs/day: 1.50    Years: 16.00    Pack years: 24.00    Types: Cigarettes    Quit date: 03/15/1970    Years since quitting: 48.6  . Smokeless tobacco: Never Used  Substance Use Topics  . Alcohol use: Yes    Alcohol/week: 3.0 - 4.0 standard drinks    Types: 3 - 4 Standard drinks or equivalent per week    Comment: 04/03/2014 "glass of wine or a beer 2-3 times/month"    Allergies  Allergen Reactions  . Tetanus Toxoid, Adsorbed Swelling and Rash    Site of injection swells    Current Outpatient Medications  Medication Sig Dispense Refill  . aspirin 81 MG chewable tablet Take 81 mg by mouth daily.    [name] atorvastatin (LIPITOR) 80 MG tablet TAKE 1 TABLET BY MOUTH ONCE DAILY. 90 tablet 3  . carvedilol (COREG) 3.125 MG tablet TAKE 1  TABLET BY MOUTH TWICE DAILY (Patient taking differently: Take 3.125 mg by mouth. ) 180 tablet 3  . clopidogrel (PLAVIX) 75 MG tablet TAKE 1 TABLET BY MOUTH ONCE DAILY. 90 tablet 3  . escitalopram (LEXAPRO) 10 MG tablet     . furosemide (LASIX) 20 MG tablet TAKE 1 TABLET BY MOUTH ONCE DAILY. 28 tablet 0  . ibuprofen (ADVIL,MOTRIN) 200 MG tablet Take 400 mg by mouth every 6 (six) hours as needed for mild pain.    [name] insulin detemir (LEVEMIR) 100 UNIT/ML injection Inject 60 Units into the skin at bedtime.     [name] levocetirizine (XYZAL) 5 MG tablet Take 5 mg by mouth at bedtime as needed for allergies.     [name] levothyroxine (SYNTHROID, LEVOTHROID) 125 MCG tablet Take 125 mcg by mouth daily.    [name]  losartan (COZAAR) 100 MG tablet     . metFORMIN (GLUCOPHAGE) 500 MG tablet Take by mouth 2 (two) times daily with a meal.    . nitroGLYCERIN (NITROSTAT) 0.4 MG SL tablet Place 0.4 mg under the tongue every 5 (five) minutes as needed for chest pain. Reported on 10/04/2015    . polyethylene glycol (MIRALAX / GLYCOLAX) packet Take 17 g by mouth daily as needed (constipation). Reported on 10/04/2015    . TRUEPLUS INSULIN SYRINGE 31G X 5/16" 0.5 ML MISC     . ZETIA 10 MG tablet TAKE 1 TABLET BY MOUTH EVERY DAY 30 tablet 6   No current facility-administered medications for this visit.     REVIEW OF SYSTEMS:  [X]  denotes positive finding, [ ]  denotes negative finding Cardiac  Comments:  Chest pain or chest pressure:    Shortness of breath upon exertion:    Short of breath when lying flat:    Irregular heart rhythm:        Vascular    Pain in calf, thigh, or hip brought on by ambulation:    Pain in feet at night that wakes you up from your sleep:     Blood clot in your veins:    Leg swelling:         Pulmonary    Oxygen at home:    Productive cough:     Wheezing:         Neurologic    Sudden weakness in arms or legs:     Sudden numbness in arms or legs:     Sudden onset of difficulty speaking or slurred speech:    Temporary loss of vision in one eye:     Problems with dizziness:         Gastrointestinal    Blood in stool:     Vomited blood:         Genitourinary    Burning when urinating:     Blood in urine:        Psychiatric    Major depression:         Hematologic    Bleeding problems:    Problems with blood clotting too easily:        Skin    Rashes or ulcers:        Constitutional    Fever or chills:     PHYSICAL EXAM:   Vitals:   11/03/18 1130  BP: 138/60  Pulse: 64  Resp: 20  Temp: 97.9 F (36.6 C)  SpO2: 96%  Weight: 154 lb (69.9 kg)  Height: 5\' 1"  (1.549 m)    GENERAL: The patient is a well-nourished female,  in no acute distress. The vital signs  are documented above. CARDIAC: There is a regular rate and rhythm.  VASCULAR: I do not detect carotid bruits. She has palpable femoral pulses.  I cannot palpate pedal pulses. She does not have any significant lower extremity swelling. She does have some hyperpigmentation bilaterally. PULMONARY: There is good air exchange bilaterally without wheezing or rales. ABDOMEN: Soft and non-tender with normal pitched bowel sounds.  MUSCULOSKELETAL: There are no major deformities or cyanosis. NEUROLOGIC: No focal weakness or paresthesias are detected. SKIN: There are no ulcers or rashes noted. PSYCHIATRIC: The patient has a normal affect.  DATA:    ARTERIAL DUPLEX: I have independently interpreted her duplex scan today.  On the right side the mid superficial femoral artery is occluded.  There is biphasic flow proximal to this and monophasic flow distal to this.  There is a monophasic anterior tibial, posterior tibial, and peroneal signal on the right.  ABIs were not obtained.  On the left side there is a biphasic common femoral waveform with monophasic flow distally.  The superficial femoral artery appears to be occluded.  There is a monophasic posterior tibial signal only on the left.  MEDICAL ISSUES:   PERIPHERAL VASCULAR DISEASE: Based on her exam she does have evidence of infrainguinal arterial occlusive disease bilaterally.  This likely explains her calf symptoms.  She also has some knee pain which is more likely related to arthritis.  We have discussed the importance of walking and the development of collaterals.  We have also discussed the importance of nutrition.  I have recommended a follow-up visit in 9 months and I have ordered ABIs at that time.  If her symptoms progress before then not then I think we could consider an arteriogram.  Currently as her symptoms are tolerable I would not recommend arteriography.  In addition she has a rash under her lower abdomen which currently is a  contraindication to an arteriogram regardless.  CHRONIC VENOUS INSUFFICIENCY: She does have some hyperpigmentation bilaterally consistent with chronic venous insufficiency.  She does state that her legs feel little bit better when she elevates her legs sometimes.  Given her underlying peripheral vascular disease however I have explained that she really should not elevate her legs too high as this could potentially cause rest pain.  I will plan on seeing her back in 9 months.  She knows to call sooner if she has problems.  Deitra [facility] [phone]

## 2018-11-24 ENCOUNTER — Telehealth: Payer: Self-pay

## 2018-11-24 NOTE — Telephone Encounter (Signed)
ERROR - see other note

## 2018-11-24 NOTE — Telephone Encounter (Signed)
Daughter called and said that her mom is having worsening pain and swelling in her left leg. She said that her left lower leg is now really red and swelling significantly increased She said that walking has become painful as well. All of these are a change from when she was in a few weeks ago.   Appt made for pt to come in and be looked at tomorrow.   York Cerise, CMA

## 2018-11-25 ENCOUNTER — Other Ambulatory Visit: Payer: Self-pay

## 2018-11-25 ENCOUNTER — Ambulatory Visit (INDEPENDENT_AMBULATORY_CARE_PROVIDER_SITE_OTHER): Payer: PPO

## 2018-11-25 ENCOUNTER — Encounter: Payer: Self-pay | Admitting: Family

## 2018-11-25 ENCOUNTER — Other Ambulatory Visit: Payer: Self-pay | Admitting: *Deleted

## 2018-11-25 ENCOUNTER — Encounter: Payer: Self-pay | Admitting: *Deleted

## 2018-11-25 ENCOUNTER — Encounter (HOSPITAL_COMMUNITY): Payer: Self-pay | Admitting: Emergency Medicine

## 2018-11-25 ENCOUNTER — Ambulatory Visit (INDEPENDENT_AMBULATORY_CARE_PROVIDER_SITE_OTHER): Payer: PPO | Admitting: Family

## 2018-11-25 ENCOUNTER — Ambulatory Visit (HOSPITAL_COMMUNITY)
Admission: EM | Admit: 2018-11-25 | Discharge: 2018-11-25 | Disposition: A | Discharge status: Home / Self Care | Payer: PPO | Attending: Family Medicine | Admitting: Family Medicine

## 2018-11-25 VITALS — BP 128/64 | HR 74 | Temp 96.9°F | Resp 14 | Ht 60.0 in | Wt 158.0 lb

## 2018-11-25 DIAGNOSIS — M25532 Pain in left wrist: Secondary | ICD-10-CM | POA: Diagnosis not present

## 2018-11-25 DIAGNOSIS — I998 Other disorder of circulatory system: Secondary | ICD-10-CM

## 2018-11-25 DIAGNOSIS — S63502A Unspecified sprain of left wrist, initial encounter: Secondary | ICD-10-CM

## 2018-11-25 DIAGNOSIS — Z87891 Personal history of nicotine dependence: Secondary | ICD-10-CM | POA: Diagnosis not present

## 2018-11-25 DIAGNOSIS — W19XXXA Unspecified fall, initial encounter: Secondary | ICD-10-CM

## 2018-11-25 DIAGNOSIS — I999 Unspecified disorder of circulatory system: Secondary | ICD-10-CM | POA: Diagnosis not present

## 2018-11-25 DIAGNOSIS — I779 Disorder of arteries and arterioles, unspecified: Secondary | ICD-10-CM

## 2018-11-25 DIAGNOSIS — S99922A Unspecified injury of left foot, initial encounter: Secondary | ICD-10-CM | POA: Diagnosis not present

## 2018-11-25 DIAGNOSIS — W010XXA Fall on same level from slipping, tripping and stumbling without subsequent striking against object, initial encounter: Secondary | ICD-10-CM

## 2018-11-25 DIAGNOSIS — M79672 Pain in left foot: Secondary | ICD-10-CM | POA: Diagnosis not present

## 2018-11-25 DIAGNOSIS — S93602A Unspecified sprain of left foot, initial encounter: Secondary | ICD-10-CM

## 2018-11-25 DIAGNOSIS — L539 Erythematous condition, unspecified: Secondary | ICD-10-CM | POA: Diagnosis not present

## 2018-11-25 DIAGNOSIS — S6992XA Unspecified injury of left wrist, hand and finger(s), initial encounter: Secondary | ICD-10-CM | POA: Diagnosis not present

## 2018-11-25 NOTE — ED Triage Notes (Signed)
Walking on carpeted floor, lost balance and fell.  Fell on left side.  Left wrist pain pain, bruise to left hand, pain to left ankle and left foot.  Daughter is with patient .  2 rings removed from patient and handed to daughter.  Patient has some memory issues -the reason for daughter being present.  Memory is not new.  Patient did not hit her head.  Left radial pulse 2 +.  Left foot is swollen, left ankle is swollen.

## 2018-11-25 NOTE — Patient Instructions (Signed)
Peripheral Vascular Disease  Peripheral vascular disease (PVD) is a disease of the blood vessels that are not part of your heart and brain. A simple term for PVD is poor circulation. In most cases, PVD narrows the blood vessels that carry blood from your heart to the rest of your body. This can reduce the supply of blood to your arms, legs, and internal organs, like your stomach or kidneys. However, PVD most often affects a person's lower legs and feet. Without treatment, PVD tends to get worse. PVD can also lead to acute ischemic limb. This is when an arm or leg suddenly cannot get enough blood. This is a medical emergency. Follow these instructions at home: Lifestyle  Do not use any products that contain nicotine or tobacco, such as cigarettes and e-cigarettes. If you need help quitting, ask your doctor.  Lose weight if you are overweight. Or, stay at a healthy weight as told by your doctor.  Eat a diet that is low in fat and cholesterol. If you need help, ask your doctor.  Exercise regularly. Ask your doctor for activities that are right for you. General instructions  Take over-the-counter and prescription medicines only as told by your doctor.  Take good care of your feet: ? Wear comfortable shoes that fit well. ? Check your feet often for any cuts or sores.  Keep all follow-up visits as told by your doctor This is important. Contact a doctor if:  You have cramps in your legs when you walk.  You have leg pain when you are at rest.  You have coldness in a leg or foot.  Your skin changes.  You are unable to get or have an erection (erectile dysfunction).  You have cuts or sores on your feet that do not heal. Get help right away if:  Your arm or leg turns cold, numb, and blue.  Your arms or legs become red, warm, swollen, painful, or numb.  You have chest pain.  You have trouble breathing.  You suddenly have weakness in your face, arm, or leg.  You become very  confused or you cannot speak.  You suddenly have a very bad headache.  You suddenly cannot see. Summary  Peripheral vascular disease (PVD) is a disease of the blood vessels.  A simple term for PVD is poor circulation. Without treatment, PVD tends to get worse.  Treatment may include exercise, low fat and low cholesterol diet, and quitting smoking. This information is not intended to replace advice given to you by your health care provider. Make sure you discuss any questions you have with your health care provider. Document Released: 06/25/2009 Document Revised: 03/13/2017 Document Reviewed: 05/08/2016 Elsevier Patient Education  2020 [employer]  

## 2018-11-25 NOTE — Progress Notes (Signed)
[facility] [place]   CC: Follow up peripheral artery occlusive disease  History of Present Illness Alyssa [name] is a 83 y.o. female who returns today after daughter called yestereday and said that her mom is having worsening pain and swelling in her left foot. She said that her left foot is now really red, swelling at ball of left foot. She said that walking has become painful as well. All of these are a change from when she was in a few weeks ago.  Dr. Scot Dock last evaluated pt on 10-20-18. At that time based on her exam she did have evidence of infrainguinal arterial occlusive disease bilaterally.  This likely explained her calf symptoms.  She also had some knee pain which was more likely related to arthritis.  Dr. Scot Dock discussed the importance of walking and the development of collaterals and the importance of nutrition.  Dr. Scot Dock recommended a follow-up visit in 9 months with ABIs   If her symptoms progress before then, then we could consider an arteriogram. At that time her symptoms were tolerable and Dr. Scot Dock did not recommend arteriography.  In addition she had a rash under her lower abdomen which currently is a contraindication to an arteriogram regardless. She had some hyperpigmentation bilaterally consistent with chronic venous insufficiency.  Her legs felt a little bit better when she elevated her legs sometimes.  Given her underlying peripheral vascular disease however, Dr. Scot Dock explained that she really should not elevate her legs too high as this could potentially cause rest pain.   Diabetic: Yes, last A1C result on file was 9.1 from 2015, states her home glucose checks at HS are about 160's, states she does not check very often Tobacco RDE:YCXKGY smoker, quitin 1971, smoked x 16years  Pt meds include: Statin :Yes Betablocker:Yes ASA:no, she does not remember that she needs to take Other anticoagulants/antiplatelets:Plavix   Past Medical  History:  Diagnosis Date  . Anemia   . CAD (coronary artery disease)    a. 2013 NSTEMI s/p DES to LAD. b. 04/03/14: NSTEMI s/p DES to OM1, DES to dRCA.  [name] Chronic diastolic CHF (congestive heart failure) ([place])    a. 2D ECHO 04/02/14 with hypokinesis in the basal inferior and inferoseptal walls. Focal and moderate concentric LVH. Overall LVEF 60-65%. G1DD. Elevated EDLV filling pressures. Mild TR.  . Diverticulosis   . GERD (gastroesophageal reflux disease)   . History of GI bleed    a. 2013: adm for ABL anemia. 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  [name] HLD (hyperlipidemia)   . HTN (hypertension)   . Hypothyroid   . Memory disorder 10/06/2017  . Peripheral vascular disease ([place])   . Refusal of blood transfusions as patient is Jehovah's Witness 09-29-12  . Serrated polyp of colon 2013  . Type II diabetes mellitus ([place])     Social History Social History   Tobacco Use  . Smoking status: Former Smoker    Packs/day: 1.50    Years: 16.00    Pack years: 24.00    Types: Cigarettes    Quit date: 03/15/1970    Years since quitting: 48.7  . Smokeless tobacco: Never Used  Substance Use Topics  . Alcohol use: Yes    Alcohol/week: 3.0 - 4.0 standard drinks    Types: 3 - 4 Standard drinks or equivalent per week    Comment: 04/03/2014 "glass of wine or a beer  2-3 times/month"  . Drug use: No    Family History Family History  Problem Relation Age of Onset  . Arthritis Father   . Heart disease Mother   . Varicose Veins Mother   . Hypertension Other     Past Surgical History:  Procedure Laterality Date  . CESAREAN SECTION  ~ 1961; 1966   plus 3 NVD  . COLONOSCOPY  02/27/2012   Procedure: COLONOSCOPY;  Surgeon: Lear Ng, MD;  Location: [facility] ENDOSCOPY;  Service: Endoscopy;  Laterality: N/A;  . CORONARY ANGIOPLASTY WITH STENT PLACEMENT  05/2011; 04/03/2014  .  ESOPHAGOGASTRODUODENOSCOPY  02/26/2012   Procedure: ESOPHAGOGASTRODUODENOSCOPY (EGD);  Surgeon: Lear Ng, MD;  Location: [facility] ENDOSCOPY;  Service: Endoscopy;  Laterality: N/A;  . LEFT HEART CATHETERIZATION WITH CORONARY ANGIOGRAM N/A 06/12/2011   Procedure: LEFT HEART CATHETERIZATION WITH CORONARY ANGIOGRAM;  Surgeon: Josue Hector, MD;  Location: [facility] CATH LAB;  Service: Cardiovascular;  Laterality: N/A;  . LEFT HEART CATHETERIZATION WITH CORONARY ANGIOGRAM N/A 04/03/2014   Procedure: LEFT HEART CATHETERIZATION WITH CORONARY ANGIOGRAM;  Surgeon: Jettie Booze, MD;  Location: [facility] CATH LAB;  Service: Cardiovascular;  Laterality: N/A;  . TONSILLECTOMY    . VAGINAL HYSTERECTOMY      Allergies  Allergen Reactions  . Tetanus Toxoid, Adsorbed Swelling and Rash    Site of injection swells    Current Outpatient Medications  Medication Sig Dispense Refill  . aspirin 81 MG chewable tablet Take 81 mg by mouth daily.    [name] atorvastatin (LIPITOR) 80 MG tablet TAKE 1 TABLET BY MOUTH ONCE DAILY. 90 tablet 3  . carvedilol (COREG) 3.125 MG tablet TAKE 1 TABLET BY MOUTH TWICE DAILY (Patient taking differently: Take 3.125 mg by mouth. ) 180 tablet 3  . clopidogrel (PLAVIX) 75 MG tablet TAKE 1 TABLET BY MOUTH ONCE DAILY. 90 tablet 3  . escitalopram (LEXAPRO) 10 MG tablet     . furosemide (LASIX) 20 MG tablet TAKE 1 TABLET BY MOUTH ONCE DAILY. 28 tablet 0  . ibuprofen (ADVIL,MOTRIN) 200 MG tablet Take 400 mg by mouth every 6 (six) hours as needed for mild pain.    [name] insulin detemir (LEVEMIR) 100 UNIT/ML injection Inject 60 Units into the skin at bedtime.     [name] levocetirizine (XYZAL) 5 MG tablet Take 5 mg by mouth at bedtime as needed for allergies.     [name] levothyroxine (SYNTHROID, LEVOTHROID) 125 MCG tablet Take 125 mcg by mouth daily.    [name] losartan (COZAAR) 100 MG tablet     . metFORMIN (GLUCOPHAGE) 500 MG tablet Take by mouth 2 (two) times daily with a meal.    . nitroGLYCERIN (NITROSTAT) 0.4 MG  SL tablet Place 0.4 mg under the tongue every 5 (five) minutes as needed for chest pain. Reported on 10/04/2015    . polyethylene glycol (MIRALAX / GLYCOLAX) packet Take 17 g by mouth daily as needed (constipation). Reported on 10/04/2015    . TRUEPLUS INSULIN SYRINGE 31G X 5/16" 0.5 ML MISC     . ZETIA 10 MG tablet TAKE 1 TABLET BY MOUTH EVERY DAY 30 tablet 6   No current facility-administered medications for this visit.     ROS: See HPI for pertinent positives and negatives.   Physical Examination  Vitals:   11/25/18 1211  BP: 128/64  Pulse: 74  Resp: 14  Temp: (!) 96.9 F (36.1 C)  TempSrc: Temporal  SpO2: 97%  Weight: 158 lb (71.7 kg)  Height: 5' (1.524 m)  Body mass index is 30.86 kg/m.  General: A&O x 3, WDWN, obese female. Gait: limp HEENT: No gross abnormalities.  Pulmonary: Respirations are non labored, fair air movement in all fields, no rales, rhonchi, or wheezes Cardiac: regular rhythm, no detected murmur.         Carotid Bruits Right Left   Negative Negative   Radial pulses are palpable bilaterally   Adominal aortic pulse is not palpable                         VASCULAR EXAM: Extremities with ischemic changes: dependent rubor in left forefoot, without Gangrene; without open wounds.                                                                                                          LE Pulses Right Left       FEMORAL  2+ palpable  2+ palpable        POPLITEAL  not palpable   not palpable       POSTERIOR TIBIAL  not palpable, + brisk Doppler signal   not palpable, + brisk Doppler signal        DORSALIS PEDIS      ANTERIOR TIBIAL not palpable, + Doppler signal  not palpable, no Doppler signal     PERONEAL + Doppler signal No Doppler signal   Abdomen: soft, NT, no palpable masses. Skin: no cellulitis, no ulcers noted. Yeast appearing rash under left breast.  Musculoskeletal: no muscle wasting or atrophy.  Neurologic: A&O X 3; appropriate affect,  Sensation is normal; MOTOR FUNCTION:  moving all extremities equally, motor strength 5/5 throughout. Speech is fluent/normal. CN 2-12 intact. Psychiatric: Thought content is normal, mood appropriate for clinical situation.    ASSESSMENT: Alyssa [name] is a 83 y.o. female who presents with: ischemic left foot rest pain that is worsening in the last month. Left forefoot dependent rubor.  Today I am unable to obtain Doppler signals at her left DP and peroneal, I do detect a brisk left PT Doppler signal.   Her ABI's on 11-03-18 were .38 on the right and .28 on the left, left TBI was 0.12 with a 22 toe pressure.   Serum creatinine 1.13 on 06-11-18.   DATA  Bilateral LE Arterial Duplex (11-03-18):  +-----------+--------+-----+--------+----------+--------+ RIGHT      PSV cm/sRatioStenosisWaveform  Comments +-----------+--------+-----+--------+----------+--------+ CFA Prox   133                  triphasic          +-----------+--------+-----+--------+----------+--------+ DFA        104                  biphasic           +-----------+--------+-----+--------+----------+--------+ SFA Prox   55                   biphasic           +-----------+--------+-----+--------+----------+--------+ SFA Mid    131  occluded                   +-----------+--------+-----+--------+----------+--------+ SFA Distal 12                   monophasicdampened +-----------+--------+-----+--------+----------+--------+ POP Distal 17                   monophasic         +-----------+--------+-----+--------+----------+--------+ ATA Distal 32                   monophasicdampened +-----------+--------+-----+--------+----------+--------+ PTA Distal 15                   monophasic         +-----------+--------+-----+--------+----------+--------+ PERO Distal14                   monophasic          +-----------+--------+-----+--------+----------+--------+  Occluded in the proximal to mid segment of the SFA with visualized collaterals with reconstitution in the distal femoal segment.  +-----------+--------+-----+--------+----------+---------------------+ LEFT       PSV cm/sRatioStenosisWaveform  Comments              +-----------+--------+-----+--------+----------+---------------------+ CFA Prox   46                   biphasic                        +-----------+--------+-----+--------+----------+---------------------+ DFA        147                  triphasic                       +-----------+--------+-----+--------+----------+---------------------+ SFA Prox   19 / 378                       collateral / occluded +-----------+--------+-----+--------+----------+---------------------+ SFA Mid    17 / 172                                             +-----------+--------+-----+--------+----------+---------------------+ SFA Distal 40                   monophasic                      +-----------+--------+-----+--------+----------+---------------------+ POP Mid    31 /187              monophasic                      +-----------+--------+-----+--------+----------+---------------------+ ATA Distal                                NV                    +-----------+--------+-----+--------+----------+---------------------+ PTA Distal 28                   monophasic                      +-----------+--------+-----+--------+----------+---------------------+ PERO Distal  NV                    +-----------+--------+-----+--------+----------+---------------------+ Summary: Right: No color flow or Doppler signal visualized in the proximal to mid segment of the femoral artery with visualized colaterals and reconstitution in the distal femoral artery. Low flow noted on distally. Left: No color flow  or Doppler signal visualized in the proximal femoral artery with collaterals visualized providing flow on distally. Segmental stenosis visualized throughout. Unable to visualize flow in the ATA and peroneal artery   ABI (Date: 11-03-18): ABI Findings: +---------+------------------+-----+----------+--------+ Right    Rt Pressure (mmHg)IndexWaveform  Comment  +---------+------------------+-----+----------+--------+ Brachial 176                                       +---------+------------------+-----+----------+--------+ ATA      66                0.38 monophasic         +---------+------------------+-----+----------+--------+ PTA      66                0.38 monophasic         +---------+------------------+-----+----------+--------+ Great Toe28                0.16                    +---------+------------------+-----+----------+--------+  +---------+------------------+-----+----------+-------+ Left     Lt Pressure (mmHg)IndexWaveform  Comment +---------+------------------+-----+----------+-------+ Brachial 176                                      +---------+------------------+-----+----------+-------+ ATA      49                0.28 monophasic        +---------+------------------+-----+----------+-------+ PTA      36                0.20 monophasic        +---------+------------------+-----+----------+-------+ Great Toe22                0.12                   +---------+------------------+-----+----------+-------+  +-------+-----------+-----------+------------+------------+ ABI/TBIToday's ABIToday's TBIPrevious ABIPrevious TBI +-------+-----------+-----------+------------+------------+ Right  0.38       0.16       0.34                     +-------+-----------+-----------+------------+------------+ Left   0.28       0.12       0.49                      +-------+-----------+-----------+------------+------------+  Summary: Right: Resting right ankle-brachial index indicates severe right lower extremity arterial disease. The right toe-brachial index is abnormal. RT great toe pressure = 28 mmHg.  Left: Resting left ankle-brachial index indicates critical left limb ischemia. The left toe-brachial index is abnormal. LT Great toe pressure = 22 mmHg.   PLAN:  Based on the patient's vascular studies and examination, and after discussing with Dr. Oneida Alar, pt will be scheduled for an arteriogram of lower extremities, possible intervention left LE for worsening ischemic pain of left foot.  Monistat OTC cream to yeast appearaing rash under left breast, discussed with daughter.  The patient was given information about PAD including signs, symptoms, treatment, what symptoms should prompt the patient to seek immediate medical care, and risk reduction measures to take.  Clemon Chambers, RN, MSN, FNP-C [facility] Phone: [phone]  Clinic MD: [facility]  11/25/18 12:36 PM

## 2018-11-25 NOTE — ED Provider Notes (Signed)
[place]    CSN: [id number] Arrival date & time: 11/25/18  1712     History   Chief Complaint Chief Complaint  Patient presents with  . Fall    HPI Alyssa [name] is a 83 y.o. female.   HPI  Patient was at a medical office today and tripped on rug exiting the building.  [place] towards the left.  Injured her left wrist and foot/ankle.  Here in a wheelchair for evaluation.  Did not hit head or lose consciousness.  Here with daughter. She has pre existing memory impairment that is unchanged. Denies neck pr back pain.  Skinned her left knee but can weight bear without difficulty.  Allergic to tetanus - apparently a local reaction.  Past Medical History:  Diagnosis Date  . Anemia   . CAD (coronary artery disease)    a. 2013 NSTEMI s/p DES to LAD. b. 04/03/14: NSTEMI s/p DES to OM1, DES to dRCA.  [name] Chronic diastolic CHF (congestive heart failure) ([place])    a. 2D ECHO 04/02/14 with hypokinesis in the basal inferior and inferoseptal walls. Focal and moderate concentric LVH. Overall LVEF 60-65%. G1DD. Elevated EDLV filling pressures. Mild TR.  . Diverticulosis   . GERD (gastroesophageal reflux disease)   . History of GI bleed    a. 2013: adm for ABL anemia. 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  [name] HLD (hyperlipidemia)   . HTN (hypertension)   . Hypothyroid   . Memory disorder 10/06/2017  . Peripheral vascular disease ([place])   . Refusal of blood transfusions as patient is Jehovah's Witness 09-29-12  . Serrated polyp of colon 2013  . Type II diabetes mellitus [facility])     Patient Active Problem List   Diagnosis Date Noted  . Memory disorder 10/06/2017  . Insulin dependent diabetes mellitus ([place]) 11/19/2016  . Chest pain 11/18/2016  . Diabetes mellitus without complication ([place]) 40/98/1191  . Dysthymia 06/20/2014  . Hypertensive heart disease 04/11/2014  . Anemia   .  History of GI bleed   . CAD (coronary artery disease)   . HLD (hyperlipidemia)   . Peripheral vascular disease ([place])   . Chronic diastolic CHF (congestive heart failure) ([place])   . Essential hypertension   . NSTEMI (non-ST elevated myocardial infarction) ([place]) 04/01/2014  . Refusal of blood transfusions as patient is Jehovah's Witness 09/29/2012  . Blood loss anemia 02/26/2012  . Melena 02/25/2012  . GERD (gastroesophageal reflux disease) 11/11/2011  . DM type 2 (diabetes mellitus, type 2) ([place]) 05/26/2011  . Hypothyroid     Past Surgical History:  Procedure Laterality Date  . CESAREAN SECTION  ~ 1961; 1966   plus 3 NVD  . COLONOSCOPY  02/27/2012   Procedure: COLONOSCOPY;  Surgeon: Lear Ng, MD;  Location: [facility] ENDOSCOPY;  Service: Endoscopy;  Laterality: N/A;  . CORONARY ANGIOPLASTY WITH STENT PLACEMENT  05/2011; 04/03/2014  . ESOPHAGOGASTRODUODENOSCOPY  02/26/2012   Procedure: ESOPHAGOGASTRODUODENOSCOPY (EGD);  Surgeon: Lear Ng, MD;  Location: [facility] ENDOSCOPY;  Service: Endoscopy;  Laterality: N/A;  . LEFT HEART CATHETERIZATION WITH CORONARY ANGIOGRAM N/A 06/12/2011   Procedure: LEFT HEART CATHETERIZATION WITH CORONARY ANGIOGRAM;  Surgeon: Josue Hector, MD;  Location: [facility] CATH LAB;  Service: Cardiovascular;  Laterality: N/A;  . LEFT HEART CATHETERIZATION WITH CORONARY ANGIOGRAM N/A 04/03/2014   Procedure: LEFT HEART CATHETERIZATION WITH CORONARY ANGIOGRAM;  Surgeon: Jettie Booze, MD;  Location: [facility] CATH  LAB;  Service: Cardiovascular;  Laterality: N/A;  . TONSILLECTOMY    . VAGINAL HYSTERECTOMY      OB History   No obstetric history on file.      Home Medications    Prior to Admission medications   Medication Sig Start Date End Date Taking? Authorizing Provider  ibuprofen (ADVIL,MOTRIN) 200 MG tablet Take 400 mg by mouth every 6 (six) hours as needed for mild pain.   Yes [provider]  naproxen sodium (ALEVE) 220 MG tablet Take 220 mg by  mouth.   Yes [provider]  aspirin 81 MG chewable tablet Take 81 mg by mouth daily.    [provider]  atorvastatin (LIPITOR) 80 MG tablet TAKE 1 TABLET BY MOUTH ONCE DAILY. 12/22/17   Sueanne Margarita, MD  carvedilol (COREG) 3.125 MG tablet TAKE 1 TABLET BY MOUTH TWICE DAILY Patient taking differently: Take 3.125 mg by mouth.  11/17/17   Sueanne Margarita, MD  clopidogrel (PLAVIX) 75 MG tablet TAKE 1 TABLET BY MOUTH ONCE DAILY. 11/17/17   Sueanne Margarita, MD  escitalopram (LEXAPRO) 10 MG tablet  02/23/17   [provider]  furosemide (LASIX) 20 MG tablet TAKE 1 TABLET BY MOUTH ONCE DAILY. 12/22/17   Sueanne Margarita, MD  insulin detemir (LEVEMIR) 100 UNIT/ML injection Inject 60 Units into the skin at bedtime.     [provider]  levocetirizine (XYZAL) 5 MG tablet Take 5 mg by mouth at bedtime as needed for allergies.  07/24/16   [provider]  levothyroxine (SYNTHROID, LEVOTHROID) 125 MCG tablet Take 125 mcg by mouth daily. 07/05/15   [provider]  losartan (COZAAR) 100 MG tablet  10/13/18   [provider]  losartan (COZAAR) 50 MG tablet  11/08/18   [provider]  metFORMIN (GLUCOPHAGE) 500 MG tablet Take by mouth 2 (two) times daily with a meal.    [provider]  nitroGLYCERIN (NITROSTAT) 0.4 MG SL tablet Place 0.4 mg under the tongue every 5 (five) minutes as needed for chest pain. Reported on 10/04/2015    [provider]  polyethylene glycol (MIRALAX / GLYCOLAX) packet Take 17 g by mouth daily as needed (constipation). Reported on 10/04/2015    [provider]  [place] X 5/16" 0.5 ML MISC  12/31/16   [provider]  ZETIA 10 MG tablet TAKE 1 TABLET BY MOUTH EVERY DAY 03/05/15   Sueanne Margarita, MD    Family History Family History  Problem Relation Age of Onset  . Arthritis Father   . Heart disease Mother   . Varicose Veins Mother   . Hypertension Other      Social History Social History   Tobacco Use  . Smoking status: Former Smoker    Packs/day: 1.50    Years: 16.00    Pack years: 24.00    Types: Cigarettes    Quit date: 03/15/1970    Years since quitting: 48.7  . Smokeless tobacco: Never Used  Substance Use Topics  . Alcohol use: Yes    Alcohol/week: 3.0 - 4.0 standard drinks    Types: 3 - 4 Standard drinks or equivalent per week    Comment: 04/03/2014 "glass of wine or a beer 2-3 times/month"  . Drug use: No     Allergies   Tetanus toxoid, adsorbed   Review of Systems Review of Systems  Constitutional: Negative for chills and fever.  HENT: Negative for ear pain and sore  throat.   Eyes: Negative for pain and visual disturbance.  Respiratory: Negative for cough and shortness of breath.   Cardiovascular: Negative for chest pain and palpitations.  Gastrointestinal: Negative for abdominal pain and vomiting.  Genitourinary: Negative for dysuria and hematuria.  Musculoskeletal: Positive for arthralgias and gait problem. Negative for back pain.  Skin: Negative for color change and rash.  Neurological: Negative for seizures and syncope.  Psychiatric/Behavioral: Positive for confusion.       Chronic memory impairment  All other systems reviewed and are negative.    Physical Exam Triage Vital Signs ED Triage Vitals  Enc Vitals Group     BP 11/25/18 1747 137/64     Pulse Rate 11/25/18 1747 63     Resp 11/25/18 1747 20     Temp 11/25/18 1747 (!) 97.4 F (36.3 C)     Temp src --      SpO2 11/25/18 1747 96 %     Weight --      Height --      Head Circumference --      Peak Flow --      Pain Score 11/25/18 1742 10     Pain Loc --      Pain Edu? --      Excl. in [place]? --    No data found.  Updated Vital Signs BP 137/64 (BP Location: Right Arm)   Pulse 63   Temp (!) 97.4 F (36.3 C)   Resp 20   SpO2 96%       Physical Exam Constitutional:      General: She is not in acute distress.    Appearance: She is  well-developed and normal weight.     Comments: Elderly woman in wheelchair.  Appears stated age.  Pleasant.  HENT:     Head: Normocephalic and atraumatic.  Eyes:     Conjunctiva/sclera: Conjunctivae normal.     Pupils: Pupils are equal, round, and reactive to light.  Neck:     Musculoskeletal: Normal range of motion and neck supple. No muscular tenderness.  Cardiovascular:     Rate and Rhythm: Normal rate and regular rhythm.     Heart sounds: Normal heart sounds.  Pulmonary:     Effort: Pulmonary effort is normal. No respiratory distress.     Breath sounds: Normal breath sounds.  Abdominal:     General: There is no distension.     Palpations: Abdomen is soft.  Musculoskeletal: Normal range of motion.     Comments: Left wrist is examined.  There is no ecchymosis on the dorsum of the wrist in the central carpal region.  Measures 3 cm across.  No soft tissue swelling.  Good range of motion.  Minimal tenderness to palpation of the carpal bones only.  No tenderness over the distal radius or ulna.  Metacarpals and fingers normal.  Left ankle has good range of motion with mild lateral swelling.  Tenderness to palpation over the distal metatarsals 234 in toes 234 with no swelling or discoloration noted.  Skin:    General: Skin is warm and dry.  Neurological:     Mental Status: She is alert.      UC Treatments / Results  Labs (all labs ordered are listed, but only abnormal results are displayed) Labs Reviewed - No data to display  EKG   Radiology Dg Wrist Complete Left  Result Date: 11/25/2018 CLINICAL DATA:  Fall.  Pain. EXAM: LEFT WRIST - COMPLETE 3+ VIEW COMPARISON:  None. FINDINGS: Osteopenia. Degenerate changes at the base of the thumb. No acute fracture or dislocation. Scaphoid intact. Nutrient foramina within the distal radius. IMPRESSION: No acute osseous abnormality. Electronically Signed   By: Abigail Miyamoto M.D.   On: 11/25/2018 18:55   Dg Foot Complete Left  Result Date:  11/25/2018 CLINICAL DATA:  [place] today with foot pain.  Unable to bear weight. EXAM: LEFT FOOT - COMPLETE 3+ VIEW COMPARISON:  None. FINDINGS: There is no evidence of fracture or dislocation. There is no evidence of arthropathy or other focal bone abnormality. Soft tissues are unremarkable. IMPRESSION: Negative. Electronically Signed   By: Clemence Stillings Chimes M.D.   On: 11/25/2018 18:56    Procedures Procedures (including critical care time)  Medications Ordered in UC Medications - No data to display  Initial Impression / Assessment and Plan / UC Course  I have reviewed the triage vital signs and the nursing notes.  Pertinent labs & imaging results that were available during my care of the patient were reviewed by me and considered in my medical decision making (see chart for details).     All x-rays are negative.  Discussed sprains. Final Clinical Impressions(s) / UC Diagnoses   Final diagnoses:  Fall, initial encounter  Sprain of left wrist, initial encounter  Foot sprain, left, initial encounter     Discharge Instructions     May use ice to take down pain and swelling Limit walking both foot and ankle are painful Tylenol as needed for pain Follow-up with your primary care doctor if any problems persist beyond Monday    ED Prescriptions    None     Controlled Substance Prescriptions [place] Controlled Substance Registry consulted? Not Applicable   Raylene Everts, MD 11/25/18 1904

## 2018-11-25 NOTE — Discharge Instructions (Addendum)
May use ice to take down pain and swelling Limit walking both foot and ankle are painful Tylenol as needed for pain Follow-up with your primary care doctor if any problems persist beyond Monday

## 2018-11-30 ENCOUNTER — Telehealth: Payer: Self-pay

## 2018-11-30 NOTE — Telephone Encounter (Signed)
Spoke to pt's daughter Phineas Douglas. Pt wanted to reschedule her PV lab procedure. It has been rescheduled. Covid test has been rescheduled. No further questions at this time.

## 2018-12-07 ENCOUNTER — Telehealth: Payer: Self-pay | Admitting: *Deleted

## 2018-12-07 ENCOUNTER — Other Ambulatory Visit: Admission: RE | Admit: 2018-12-07 | Payer: PPO | Source: Ambulatory Visit

## 2018-12-07 NOTE — Telephone Encounter (Signed)
[name], the patient's daughter, called today and cancelled this aortogram scheduled for 12-10-18. She states "that her mother is just not up to it and they will let us know when she is".  I put her scheduling sheets in Dr. Nicole Cella pending file.

## 2018-12-10 ENCOUNTER — Encounter (HOSPITAL_COMMUNITY): Admission: RE | Payer: Self-pay | Source: Home / Self Care

## 2018-12-10 ENCOUNTER — Ambulatory Visit (HOSPITAL_COMMUNITY): Admission: RE | Admit: 2018-12-10 | Payer: PPO | Source: Home / Self Care | Admitting: Vascular Surgery

## 2018-12-10 SURGERY — ABDOMINAL AORTOGRAM W/LOWER EXTREMITY
Anesthesia: LOCAL | Laterality: Bilateral

## 2018-12-13 ENCOUNTER — Telehealth: Payer: Self-pay | Admitting: Neurology

## 2018-12-13 NOTE — Telephone Encounter (Signed)
I called this patient and LVM regarding rescheduling her 10/5 appointment due to Judson Roch being out on Thurs. Afternoons. Requested patient call back to r/s.

## 2018-12-16 ENCOUNTER — Encounter: Payer: Self-pay | Admitting: Neurology

## 2018-12-16 DIAGNOSIS — L245 Irritant contact dermatitis due to other chemical products: Secondary | ICD-10-CM | POA: Diagnosis not present

## 2018-12-16 NOTE — Telephone Encounter (Signed)
Appt has been cancelled. I have sent a letter to patient to advise.

## 2019-01-06 ENCOUNTER — Ambulatory Visit: Payer: PPO | Admitting: Neurology

## 2019-01-24 DIAGNOSIS — E1165 Type 2 diabetes mellitus with hyperglycemia: Secondary | ICD-10-CM | POA: Diagnosis not present

## 2019-01-24 DIAGNOSIS — Z79899 Other long term (current) drug therapy: Secondary | ICD-10-CM | POA: Diagnosis not present

## 2019-01-24 DIAGNOSIS — I10 Essential (primary) hypertension: Secondary | ICD-10-CM | POA: Diagnosis not present

## 2019-01-24 DIAGNOSIS — E785 Hyperlipidemia, unspecified: Secondary | ICD-10-CM | POA: Diagnosis not present

## 2019-01-24 DIAGNOSIS — E039 Hypothyroidism, unspecified: Secondary | ICD-10-CM | POA: Diagnosis not present

## 2019-01-24 DIAGNOSIS — M79672 Pain in left foot: Secondary | ICD-10-CM | POA: Diagnosis not present

## 2019-01-24 DIAGNOSIS — Z1331 Encounter for screening for depression: Secondary | ICD-10-CM | POA: Diagnosis not present

## 2019-01-24 DIAGNOSIS — M19072 Primary osteoarthritis, left ankle and foot: Secondary | ICD-10-CM | POA: Diagnosis not present

## 2019-01-24 DIAGNOSIS — S93402A Sprain of unspecified ligament of left ankle, initial encounter: Secondary | ICD-10-CM | POA: Diagnosis not present

## 2019-01-24 DIAGNOSIS — Z6829 Body mass index (BMI) 29.0-29.9, adult: Secondary | ICD-10-CM | POA: Diagnosis not present

## 2019-01-24 DIAGNOSIS — I739 Peripheral vascular disease, unspecified: Secondary | ICD-10-CM | POA: Diagnosis not present

## 2019-01-24 DIAGNOSIS — M25572 Pain in left ankle and joints of left foot: Secondary | ICD-10-CM | POA: Diagnosis not present

## 2019-03-07 ENCOUNTER — Inpatient Hospital Stay (HOSPITAL_COMMUNITY)
Admission: EM | Admit: 2019-03-07 | Discharge: 2019-03-09 | DRG: 643 | Disposition: A | Discharge status: Home Health | Payer: PPO | Attending: Internal Medicine | Admitting: Internal Medicine

## 2019-03-07 ENCOUNTER — Inpatient Hospital Stay (HOSPITAL_COMMUNITY): Payer: PPO

## 2019-03-07 ENCOUNTER — Other Ambulatory Visit: Payer: Self-pay

## 2019-03-07 ENCOUNTER — Emergency Department (HOSPITAL_COMMUNITY): Payer: PPO

## 2019-03-07 DIAGNOSIS — R0789 Other chest pain: Secondary | ICD-10-CM | POA: Diagnosis not present

## 2019-03-07 DIAGNOSIS — T68XXXA Hypothermia, initial encounter: Secondary | ICD-10-CM

## 2019-03-07 DIAGNOSIS — F329 Major depressive disorder, single episode, unspecified: Secondary | ICD-10-CM | POA: Diagnosis present

## 2019-03-07 DIAGNOSIS — I2510 Atherosclerotic heart disease of native coronary artery without angina pectoris: Secondary | ICD-10-CM | POA: Diagnosis not present

## 2019-03-07 DIAGNOSIS — R079 Chest pain, unspecified: Secondary | ICD-10-CM | POA: Diagnosis not present

## 2019-03-07 DIAGNOSIS — E11649 Type 2 diabetes mellitus with hypoglycemia without coma: Secondary | ICD-10-CM | POA: Diagnosis not present

## 2019-03-07 DIAGNOSIS — Z955 Presence of coronary angioplasty implant and graft: Secondary | ICD-10-CM

## 2019-03-07 DIAGNOSIS — F0390 Unspecified dementia without behavioral disturbance: Secondary | ICD-10-CM | POA: Diagnosis present

## 2019-03-07 DIAGNOSIS — E876 Hypokalemia: Secondary | ICD-10-CM | POA: Diagnosis not present

## 2019-03-07 DIAGNOSIS — E2740 Unspecified adrenocortical insufficiency: Principal | ICD-10-CM | POA: Diagnosis present

## 2019-03-07 DIAGNOSIS — I5032 Chronic diastolic (congestive) heart failure: Secondary | ICD-10-CM | POA: Diagnosis present

## 2019-03-07 DIAGNOSIS — G9341 Metabolic encephalopathy: Secondary | ICD-10-CM | POA: Diagnosis present

## 2019-03-07 DIAGNOSIS — R296 Repeated falls: Secondary | ICD-10-CM | POA: Diagnosis present

## 2019-03-07 DIAGNOSIS — E1151 Type 2 diabetes mellitus with diabetic peripheral angiopathy without gangrene: Secondary | ICD-10-CM | POA: Diagnosis present

## 2019-03-07 DIAGNOSIS — J3489 Other specified disorders of nose and nasal sinuses: Secondary | ICD-10-CM | POA: Diagnosis not present

## 2019-03-07 DIAGNOSIS — I6782 Cerebral ischemia: Secondary | ICD-10-CM | POA: Diagnosis not present

## 2019-03-07 DIAGNOSIS — R680 Hypothermia, not associated with low environmental temperature: Secondary | ICD-10-CM | POA: Diagnosis not present

## 2019-03-07 DIAGNOSIS — Z87891 Personal history of nicotine dependence: Secondary | ICD-10-CM

## 2019-03-07 DIAGNOSIS — E785 Hyperlipidemia, unspecified: Secondary | ICD-10-CM | POA: Diagnosis present

## 2019-03-07 DIAGNOSIS — R4182 Altered mental status, unspecified: Secondary | ICD-10-CM | POA: Diagnosis not present

## 2019-03-07 DIAGNOSIS — Z794 Long term (current) use of insulin: Secondary | ICD-10-CM

## 2019-03-07 DIAGNOSIS — N390 Urinary tract infection, site not specified: Secondary | ICD-10-CM | POA: Diagnosis present

## 2019-03-07 DIAGNOSIS — Z79890 Hormone replacement therapy (postmenopausal): Secondary | ICD-10-CM | POA: Diagnosis not present

## 2019-03-07 DIAGNOSIS — I252 Old myocardial infarction: Secondary | ICD-10-CM | POA: Diagnosis not present

## 2019-03-07 DIAGNOSIS — Z7982 Long term (current) use of aspirin: Secondary | ICD-10-CM

## 2019-03-07 DIAGNOSIS — R404 Transient alteration of awareness: Secondary | ICD-10-CM | POA: Diagnosis not present

## 2019-03-07 DIAGNOSIS — E161 Other hypoglycemia: Secondary | ICD-10-CM | POA: Diagnosis not present

## 2019-03-07 DIAGNOSIS — R471 Dysarthria and anarthria: Secondary | ICD-10-CM | POA: Diagnosis not present

## 2019-03-07 DIAGNOSIS — G9389 Other specified disorders of brain: Secondary | ICD-10-CM | POA: Diagnosis not present

## 2019-03-07 DIAGNOSIS — R52 Pain, unspecified: Secondary | ICD-10-CM | POA: Diagnosis not present

## 2019-03-07 DIAGNOSIS — R441 Visual hallucinations: Secondary | ICD-10-CM | POA: Diagnosis not present

## 2019-03-07 DIAGNOSIS — Z8261 Family history of arthritis: Secondary | ICD-10-CM

## 2019-03-07 DIAGNOSIS — Z20828 Contact with and (suspected) exposure to other viral communicable diseases: Secondary | ICD-10-CM | POA: Diagnosis not present

## 2019-03-07 DIAGNOSIS — Z7902 Long term (current) use of antithrombotics/antiplatelets: Secondary | ICD-10-CM

## 2019-03-07 DIAGNOSIS — I110 Hypertensive heart disease with heart failure: Secondary | ICD-10-CM | POA: Diagnosis not present

## 2019-03-07 DIAGNOSIS — I6389 Other cerebral infarction: Secondary | ICD-10-CM | POA: Diagnosis not present

## 2019-03-07 DIAGNOSIS — R58 Hemorrhage, not elsewhere classified: Secondary | ICD-10-CM | POA: Diagnosis not present

## 2019-03-07 DIAGNOSIS — E039 Hypothyroidism, unspecified: Secondary | ICD-10-CM | POA: Diagnosis present

## 2019-03-07 DIAGNOSIS — Z79899 Other long term (current) drug therapy: Secondary | ICD-10-CM

## 2019-03-07 DIAGNOSIS — E162 Hypoglycemia, unspecified: Secondary | ICD-10-CM | POA: Diagnosis present

## 2019-03-07 DIAGNOSIS — Z8249 Family history of ischemic heart disease and other diseases of the circulatory system: Secondary | ICD-10-CM

## 2019-03-07 LAB — CBC WITH DIFFERENTIAL/PLATELET
Abs Immature Granulocytes: 0.02 10*3/uL (ref 0.00–0.07)
Basophils Absolute: 0 10*3/uL (ref 0.0–0.1)
Basophils Relative: 0 %
Eosinophils Absolute: 0 10*3/uL (ref 0.0–0.5)
Eosinophils Relative: 0 %
HCT: 41.3 % (ref 36.0–46.0)
Hemoglobin: 12.8 g/dL (ref 12.0–15.0)
Immature Granulocytes: 0 %
Lymphocytes Relative: 8 %
Lymphs Abs: 0.8 10*3/uL (ref 0.7–4.0)
MCH: 27.2 pg (ref 26.0–34.0)
MCHC: 31 g/dL (ref 30.0–36.0)
MCV: 87.9 fL (ref 80.0–100.0)
Monocytes Absolute: 0.4 10*3/uL (ref 0.1–1.0)
Monocytes Relative: 4 %
Neutro Abs: 8.3 10*3/uL — ABNORMAL HIGH (ref 1.7–7.7)
Neutrophils Relative %: 88 %
Platelets: 279 10*3/uL (ref 150–400)
RBC: 4.7 MIL/uL (ref 3.87–5.11)
RDW: 13.9 % (ref 11.5–15.5)
WBC: 9.5 10*3/uL (ref 4.0–10.5)
nRBC: 0 % (ref 0.0–0.2)

## 2019-03-07 LAB — URINALYSIS, ROUTINE W REFLEX MICROSCOPIC
Bilirubin Urine: NEGATIVE
Glucose, UA: 50 mg/dL — AB
Ketones, ur: NEGATIVE mg/dL
Nitrite: NEGATIVE
Protein, ur: NEGATIVE mg/dL
Specific Gravity, Urine: 1.012 (ref 1.005–1.030)
pH: 5 (ref 5.0–8.0)

## 2019-03-07 LAB — COMPREHENSIVE METABOLIC PANEL
ALT: 28 U/L (ref 0–44)
AST: 43 U/L — ABNORMAL HIGH (ref 15–41)
Albumin: 3.6 g/dL (ref 3.5–5.0)
Alkaline Phosphatase: 83 U/L (ref 38–126)
Anion gap: 11 (ref 5–15)
BUN: 12 mg/dL (ref 8–23)
CO2: 27 mmol/L (ref 22–32)
Calcium: 9.2 mg/dL (ref 8.9–10.3)
Chloride: 106 mmol/L (ref 98–111)
Creatinine, Ser: 0.97 mg/dL (ref 0.44–1.00)
GFR calc Af Amer: >60 mL/min (ref >60)
GFR calc non Af Amer: 55 mL/min — ABNORMAL LOW (ref >60)
Glucose, Bld: 88 mg/dL (ref 70–99)
Potassium: 3.2 mmol/L — ABNORMAL LOW (ref 3.5–5.1)
Sodium: 144 mmol/L (ref 135–145)
Total Bilirubin: 1 mg/dL (ref 0.3–1.2)
Total Protein: 6.7 g/dL (ref 6.5–8.1)

## 2019-03-07 LAB — CBG MONITORING, ED
Glucose-Capillary: 97 mg/dL (ref 70–99)
Glucose-Capillary: 75 mg/dL (ref 70–99)
Glucose-Capillary: 71 mg/dL (ref 70–99)
Glucose-Capillary: 71 mg/dL (ref 70–99)
Glucose-Capillary: 45 mg/dL — ABNORMAL LOW (ref 70–99)
Glucose-Capillary: 174 mg/dL — ABNORMAL HIGH (ref 70–99)
Glucose-Capillary: 165 mg/dL — ABNORMAL HIGH (ref 70–99)
Glucose-Capillary: 147 mg/dL — ABNORMAL HIGH (ref 70–99)

## 2019-03-07 LAB — LACTIC ACID, PLASMA
Lactic Acid, Venous: 2.3 mmol/L (ref 0.5–1.9)
Lactic Acid, Venous: 1 mmol/L (ref 0.5–1.9)

## 2019-03-07 LAB — T4, FREE: Free T4: 1.3 ng/dL — ABNORMAL HIGH (ref 0.61–1.12)

## 2019-03-07 LAB — ACETAMINOPHEN LEVEL: Acetaminophen (Tylenol), Serum: <10 ug/mL — ABNORMAL LOW (ref 10–30)

## 2019-03-07 LAB — CK: Total CK: 540 U/L — ABNORMAL HIGH (ref 38–234)

## 2019-03-07 LAB — GLUCOSE, CAPILLARY: Glucose-Capillary: 206 mg/dL — ABNORMAL HIGH (ref 70–99)

## 2019-03-07 LAB — TSH: TSH: 1.195 u[IU]/mL (ref 0.350–4.500)

## 2019-03-07 LAB — SALICYLATE LEVEL: Salicylate Lvl: <7 mg/dL (ref 2.8–30.0)

## 2019-03-07 LAB — SARS CORONAVIRUS 2 (TAT 6-24 HRS): SARS Coronavirus 2: NEGATIVE

## 2019-03-07 MED ORDER — ACETAMINOPHEN 650 MG RE SUPP: 650.0000 mg | Freq: Four times a day (QID) | RECTAL | Status: DC | PRN

## 2019-03-07 MED ORDER — ESCITALOPRAM OXALATE 10 MG PO TABS
10.0000 mg | ORAL_TABLET | Freq: Every evening | ORAL | Status: DC
  Administered 2019-03-07: 20:00:00 10 mg via ORAL
  Filled 2019-03-07: qty 1

## 2019-03-07 MED ORDER — EZETIMIBE 10 MG PO TABS
10.0000 mg | ORAL_TABLET | Freq: Every evening | ORAL | Status: DC
  Administered 2019-03-07 – 2019-03-08 (×2): 10 mg via ORAL
  Filled 2019-03-07 (×2): qty 1

## 2019-03-07 MED ORDER — ENOXAPARIN SODIUM 40 MG/0.4ML ~~LOC~~ SOLN
40.0000 mg | SUBCUTANEOUS | Status: DC
  Administered 2019-03-07 – 2019-03-08 (×2): 40 mg via SUBCUTANEOUS
  Filled 2019-03-07 (×2): qty 0.4

## 2019-03-07 MED ORDER — LEVOTHYROXINE SODIUM 100 MCG PO TABS
100.0000 ug | ORAL_TABLET | Freq: Every day | ORAL | Status: DC
  Administered 2019-03-08: 06:00:00 100 ug via ORAL
  Filled 2019-03-07: qty 1

## 2019-03-07 MED ORDER — ONDANSETRON HCL 4 MG/2ML IJ SOLN: 4.0000 mg | Freq: Four times a day (QID) | INTRAMUSCULAR | Status: DC | PRN

## 2019-03-07 MED ORDER — LOSARTAN POTASSIUM 50 MG PO TABS: 50.0000 mg | ORAL_TABLET | Freq: Every day | ORAL | Status: DC

## 2019-03-07 MED ORDER — CARVEDILOL 3.125 MG PO TABS: 3.1250 mg | ORAL_TABLET | Freq: Two times a day (BID) | ORAL | Status: DC

## 2019-03-07 MED ORDER — ONDANSETRON HCL 4 MG PO TABS: 4.0000 mg | ORAL_TABLET | Freq: Four times a day (QID) | ORAL | Status: DC | PRN

## 2019-03-07 MED ORDER — LORATADINE 10 MG PO TABS
10.0000 mg | ORAL_TABLET | Freq: Every evening | ORAL | Status: DC | PRN
  Filled 2019-03-07: qty 1

## 2019-03-07 MED ORDER — LEVOCETIRIZINE DIHYDROCHLORIDE 5 MG PO TABS: 5.0000 mg | ORAL_TABLET | Freq: Every evening | ORAL | Status: DC | PRN

## 2019-03-07 MED ORDER — DEXTROSE 50 % IV SOLN
INTRAVENOUS | Status: AC
  Administered 2019-03-07: 15:00:00 50 mL
  Filled 2019-03-07: qty 50

## 2019-03-07 MED ORDER — SODIUM CHLORIDE 0.9 % IV BOLUS
500.0000 mL | Freq: Once | INTRAVENOUS | Status: AC
  Administered 2019-03-07: 500 mL via INTRAVENOUS

## 2019-03-07 MED ORDER — POTASSIUM CHLORIDE CRYS ER 20 MEQ PO TBCR
40.0000 meq | EXTENDED_RELEASE_TABLET | Freq: Once | ORAL | Status: AC
  Administered 2019-03-07: 12:00:00 40 meq via ORAL
  Filled 2019-03-07: qty 2

## 2019-03-07 MED ORDER — DONEPEZIL HCL 5 MG PO TABS
5.0000 mg | ORAL_TABLET | Freq: Every evening | ORAL | Status: DC
  Administered 2019-03-07 – 2019-03-08 (×2): 5 mg via ORAL
  Filled 2019-03-07 (×2): qty 1

## 2019-03-07 MED ORDER — CLOPIDOGREL BISULFATE 75 MG PO TABS
75.0000 mg | ORAL_TABLET | Freq: Every evening | ORAL | Status: DC
  Administered 2019-03-07 – 2019-03-08 (×2): 75 mg via ORAL
  Filled 2019-03-07 (×2): qty 1

## 2019-03-07 MED ORDER — ASPIRIN 81 MG PO CHEW
81.0000 mg | CHEWABLE_TABLET | Freq: Every day | ORAL | Status: DC
  Administered 2019-03-07 – 2019-03-09 (×3): 81 mg via ORAL
  Filled 2019-03-07 (×3): qty 1

## 2019-03-07 MED ORDER — SODIUM CHLORIDE 0.9 % IV SOLN
Freq: Once | INTRAVENOUS | Status: AC
  Administered 2019-03-07: 12:00:00 via INTRAVENOUS

## 2019-03-07 MED ORDER — ACETAMINOPHEN 325 MG PO TABS
650.0000 mg | ORAL_TABLET | Freq: Four times a day (QID) | ORAL | Status: DC | PRN
  Filled 2019-03-07: qty 2

## 2019-03-07 MED ORDER — ATORVASTATIN CALCIUM 80 MG PO TABS
80.0000 mg | ORAL_TABLET | Freq: Every evening | ORAL | Status: DC
  Administered 2019-03-07 – 2019-03-08 (×2): 80 mg via ORAL
  Filled 2019-03-07 (×2): qty 1

## 2019-03-07 NOTE — ED Notes (Addendum)
RN went into pt's room to help w/ going to the bathroom & check CBG. Pt was found confused, asking RN if the dog in the corner was real or not. Pt alert & oriented to self, place, disoriented to time and situation. CBG 165. NIH 1 for questions, unable to state which month it is. Pt asked if there was a purse hanging on the wall, and if the dog was real. Internal Medicine paged.

## 2019-03-07 NOTE — ED Triage Notes (Signed)
Daughter found patient at home at end of bed.  Blood sugar was 36.  EMS called patient was too cold for thermometer to read, patient was started on D10 IVF 250 bag with last blood sugar reported at 131.  Patient does not know what happenned.  Reported this has been happening lately and mulbitple falls/  Patient is on sliding scale insulin.,

## 2019-03-07 NOTE — ED Notes (Signed)
Internal Medicine called this RN and stated they were coming to see the pt.

## 2019-03-07 NOTE — ED Notes (Signed)
Dinner Tray Ordered @ 1830.  

## 2019-03-07 NOTE — ED Notes (Signed)
Dr. Koleen Distance paged to [phone] Wilmer Floor, RN paged by Levada Dy

## 2019-03-07 NOTE — ED Notes (Signed)
Pt taken off bair hugger, oral temp 98.4

## 2019-03-07 NOTE — ED Provider Notes (Signed)
[facility] EMERGENCY DEPARTMENT Provider Note   CSN: [id number] Arrival date & time: 03/07/19  [id number]     History   Chief Complaint Chief Complaint  Patient presents with   Cold Exposure   Hypoglycemia    HPI Alyssa [name] is a 83 y.o. female with pertinent past PMH of CHF (Echo 2015, LVH w/ EF 60-65%), Diverticulosis, GERD, HTN, HLD, Hypothyroid, PVD, T2DM, who presents to [facility] Ed with altered mental status and confusion. Patient was found at home at the end of the bed this morning by her daughter. EMS was called and patient was found to have a low blood glucose of 36. Patient states that she is confused regarding the events leading up the ED visit. She believes that she has head some falls over the last 48 hours that she believes is 2/2 to hypoglycemia. She is not sure but, she thinks she may have hit her head and is unsure whether or not she lost consciousness. She denies headache, change in vision, focal neurologic deficits. She usually manages her own medication, with the exception of her insulin. Her daughter checks her blood sugar daily and draws up the insulin. Patient states that she has been taking her medications as prescribed, including her thyroid medication. She denies chest pain, shortness of breath, dysuria, hematuria, problems with BM, or abdominal pain.     HPI  Past Medical History:  Diagnosis Date   Anemia    CAD (coronary artery disease)    a. 2013 NSTEMI s/p DES to LAD. b. 04/03/14: NSTEMI s/p DES to OM1, DES to dRCA.   Chronic diastolic CHF (congestive heart failure) ([place])    a. 2D ECHO 04/02/14 with hypokinesis in the basal inferior and inferoseptal walls. Focal and moderate concentric LVH. Overall LVEF 60-65%. G1DD. Elevated EDLV filling pressures. Mild TR.   Diverticulosis    GERD (gastroesophageal reflux disease)    History of GI bleed    a. 2013: adm for ABL anemia. EGD revealed only mild gastritis - colo confirmed AVM (likely source  of bleed) s/p APC, Sigmoid diverticulosis, small internal hemorrhoids.   HLD (hyperlipidemia)    HTN (hypertension)    Hypothyroid    Memory disorder 10/06/2017   Peripheral vascular disease ([place])    Refusal of blood transfusions as patient is Jehovah's Witness 09-29-12   Serrated polyp of colon 2013   Type II diabetes mellitus [facility])     Patient Active Problem List   Diagnosis Date Noted   Memory disorder 10/06/2017   Insulin dependent diabetes mellitus 11/19/2016   Chest pain 11/18/2016   Diabetes mellitus without complication ([place]) [id number]   Dysthymia 06/20/2014   Hypertensive heart disease 04/11/2014   Anemia    History of GI bleed    CAD (coronary artery disease)    HLD (hyperlipidemia)    Peripheral vascular disease ([facility])    Chronic diastolic CHF (congestive heart failure) ([place])    Essential hypertension    NSTEMI (non-ST elevated myocardial infarction) ([place]) 04/01/2014   Refusal of blood transfusions as patient is Jehovah's Witness 09/29/2012   Blood loss anemia 02/26/2012   Melena 02/25/2012   GERD (gastroesophageal reflux disease) 11/11/2011   DM type 2 (diabetes mellitus, type 2) ([place]) 05/26/2011   Hypothyroid     Past Surgical History:  Procedure Laterality Date   CESAREAN SECTION  ~ 1961; 1966   plus 3 NVD   COLONOSCOPY  02/27/2012   Procedure: COLONOSCOPY;  Surgeon: Lear Ng, MD;  Location:  [facility];  Service: Endoscopy;  Laterality: N/A;   CORONARY ANGIOPLASTY WITH STENT PLACEMENT  05/2011; 04/03/2014   ESOPHAGOGASTRODUODENOSCOPY  02/26/2012   Procedure: ESOPHAGOGASTRODUODENOSCOPY (EGD);  Surgeon: Lear Ng, MD;  Location[facility];  Service: Endoscopy;  Laterality: N/A;   LEFT HEART CATHETERIZATION WITH CORONARY ANGIOGRAM N/A 06/12/2011   Procedure: LEFT HEART CATHETERIZATION WITH CORONARY ANGIOGRAM;  Surgeon: Josue Hector, MD;  Location: [facility] CATH LAB;  Service: Cardiovascular;  Laterality: N/A;     LEFT HEART CATHETERIZATION WITH CORONARY ANGIOGRAM N/A 04/03/2014   Procedure: LEFT HEART CATHETERIZATION WITH CORONARY ANGIOGRAM;  Surgeon: Jettie Booze, MD;  Location: [facility] CATH LAB;  Service: Cardiovascular;  Laterality: N/A;   TONSILLECTOMY     VAGINAL HYSTERECTOMY       OB History   No obstetric history on file.      Home Medications    Prior to Admission medications   Medication Sig Start Date End Date Taking? Authorizing Provider  aspirin 81 MG chewable tablet Take 81 mg by mouth daily.   Yes [provider]  atorvastatin (LIPITOR) 80 MG tablet TAKE 1 TABLET BY MOUTH ONCE DAILY. Patient taking differently: Take 80 mg by mouth every evening.  12/22/17  Yes Turner, Eber Hong, MD  betamethasone dipropionate (DIPROLENE) 0.05 % ointment Apply 1 application topically 2 (two) times daily as needed. rash on fingers 12/16/18  Yes [provider]  carvedilol (COREG) 3.125 MG tablet TAKE 1 TABLET BY MOUTH TWICE DAILY Patient taking differently: Take 3.125 mg by mouth every evening.  11/17/17  Yes Turner, Eber Hong, MD  clopidogrel (PLAVIX) 75 MG tablet TAKE 1 TABLET BY MOUTH ONCE DAILY. Patient taking differently: Take 75 mg by mouth every evening.  11/17/17  Yes Turner, Traci R, MD  donepezil (ARICEPT) 5 MG tablet Take 5 mg by mouth every evening. 02/28/19  Yes [provider]  escitalopram (LEXAPRO) 10 MG tablet Take 10 mg by mouth every evening.  02/23/17  Yes [provider]  ibuprofen (ADVIL,MOTRIN) 200 MG tablet Take 400 mg by mouth every 6 (six) hours as needed for mild pain.   Yes [provider]  insulin detemir (LEVEMIR) 100 UNIT/ML injection Inject 64 Units into the skin at bedtime.    Yes [provider]  levocetirizine (XYZAL) 5 MG tablet Take 5 mg by mouth at bedtime as needed for allergies.  07/24/16  Yes [provider]  levothyroxine (SYNTHROID, LEVOTHROID) 125 MCG tablet Take 125 mcg by mouth every evening.   07/05/15  Yes [provider]  losartan (COZAAR) 50 MG tablet Take 50 mg by mouth daily.  11/08/18  Yes [provider]  metFORMIN (GLUCOPHAGE-XR) 500 MG 24 hr tablet Take 1,000 mg by mouth every evening. 02/28/19  Yes [provider]  naproxen sodium (ALEVE) 220 MG tablet Take 220 mg by mouth daily as needed (pain).    Yes [provider]  nitroGLYCERIN (NITROSTAT) 0.4 MG SL tablet Place 0.4 mg under the tongue every 5 (five) minutes as needed for chest pain. Reported on 10/04/2015   Yes [provider]  polyethylene glycol (MIRALAX / GLYCOLAX) packet Take 17 g by mouth daily as needed (constipation). Reported on 10/04/2015   Yes [provider]  ZETIA 10 MG tablet TAKE 1 TABLET BY MOUTH EVERY DAY Patient taking differently: Take 10 mg by mouth every evening.  03/05/15  Yes Turner, Eber Hong, MD  furosemide (LASIX) 20 MG tablet TAKE 1 TABLET BY MOUTH ONCE DAILY. Patient  not taking: Reported on 03/07/2019 12/22/17   Sueanne Margarita, MD  TRUEPLUS INSULIN SYRINGE 31G X 5/16" 0.5 ML MISC  12/31/16   [provider]    Family History Family History  Problem Relation Age of Onset   Arthritis Father    Heart disease Mother    Varicose Veins Mother    Hypertension Other     Social History Social History   Tobacco Use   Smoking status: Former Smoker    Packs/day: 1.50    Years: 16.00    Pack years: 24.00    Types: Cigarettes    Quit date: 03/15/1970    Years since quitting: 49.0   Smokeless tobacco: Never Used  Substance Use Topics   Alcohol use: Yes    Alcohol/week: 3.0 - 4.0 standard drinks    Types: 3 - 4 Standard drinks or equivalent per week    Comment: 04/03/2014 "glass of wine or a beer 2-3 times/month"   Drug use: No     Allergies   Tetanus toxoid, adsorbed   Review of Systems Review of Systems  Unable to perform ROS: Mental status change     Physical Exam Updated Vital Signs BP 123/72    Pulse 64     Temp 98.4 F (36.9 C) (Oral)    Resp 18    Ht 5\' 2"  (1.575 m)    Wt 65.8 kg    SpO2 97%    BMI 26.52 kg/m   Physical Exam HENT:     Head: Normocephalic and atraumatic.  Eyes:     General: No visual field deficit.    Extraocular Movements: Extraocular movements intact.     Pupils: Pupils are equal, round, and reactive to light.  Neck:     Musculoskeletal: Normal range of motion. Muscular tenderness (mild) present.  Cardiovascular:     Rate and Rhythm: Regular rhythm. Bradycardia present.     Pulses: Normal pulses.     Heart sounds: No murmur. No friction rub. No gallop.   Pulmonary:     Effort: Pulmonary effort is normal. No respiratory distress.     Breath sounds: Normal breath sounds.  Chest:     Chest wall: No tenderness.  Abdominal:     General: Abdomen is flat. Bowel sounds are normal.     Tenderness: There is no abdominal tenderness.  Musculoskeletal: Normal range of motion.        General: No swelling or tenderness.  Skin:    General: Skin is cool.     Findings: Ecchymosis (diffuse arms and legs) present.  Neurological:     General: No focal deficit present.     Mental Status: She is lethargic and confused.     Cranial Nerves: Dysarthria (mild (difficult to discern if at baseline)) present. No cranial nerve deficit or facial asymmetry.     Motor: No weakness.     Coordination: Coordination is intact.      ED Treatments / Results  Labs (all labs ordered are listed, but only abnormal results are displayed) Labs Reviewed  CBC WITH DIFFERENTIAL/PLATELET - Abnormal; Notable for the following components:      Result Value   Neutro Abs 8.3 (*)    All other components within normal limits  COMPREHENSIVE METABOLIC PANEL - Abnormal; Notable for the following components:   Potassium 3.2 (*)    AST 43 (*)    GFR calc non Af Amer 55 (*)    All other components within normal limits  T4,  FREE - Abnormal; Notable for the following components:   Free T4 1.30 (*)    All  other components within normal limits  CK - Abnormal; Notable for the following components:   Total CK 540 (*)    All other components within normal limits  LACTIC ACID, PLASMA - Abnormal; Notable for the following components:   Lactic Acid, Venous 2.3 (*)    All other components within normal limits  ACETAMINOPHEN LEVEL - Abnormal; Notable for the following components:   Acetaminophen (Tylenol), Serum <10 (*)    All other components within normal limits  CULTURE, BLOOD (ROUTINE X 2)  CULTURE, BLOOD (ROUTINE X 2)  SARS CORONAVIRUS 2 (TAT 6-24 HRS)  URINE CULTURE  TSH  LACTIC ACID, PLASMA  SALICYLATE LEVEL  URINALYSIS, ROUTINE W REFLEX MICROSCOPIC  CBG MONITORING, ED  CBG MONITORING, ED  CBG MONITORING, ED  CBG MONITORING, ED  CBG MONITORING, ED  CBG MONITORING, ED  CBG MONITORING, ED    EKG EKG Interpretation  Date/Time:  Monday March 07 2019 09:10:47 EST Ventricular Rate:  57 PR Interval:    QRS Duration: 103 QT Interval:  508 QTC Calculation: 495 R Axis:   21 Text Interpretation: Sinus rhythm Low voltage, precordial leads Borderline prolonged QT interval rate slower but otherwise similar to previous Confirmed by Theotis Burrow [phone]) on 03/07/2019 9:27:56 AM   Radiology Ct Head Wo Contrast  Result Date: 03/07/2019 CLINICAL DATA:  Hypoglycemia.  Recurrent falls. EXAM: CT HEAD WITHOUT CONTRAST TECHNIQUE: Contiguous axial images were obtained from the base of the skull through the vertex without intravenous contrast. COMPARISON:  Brain MRI 10/22/2016. FINDINGS: Brain: No evidence of acute infarction, hemorrhage, hydrocephalus, extra-axial collection or mass lesion/mass effect. Atrophy and chronic microvascular ischemic change are noted. Vascular: No hyperdense vessel or unexpected calcification. Skull: Intact.  No focal lesion. Sinuses/Orbits: Minimal mucosal thickening right maxillary sinus noted. Other: None. IMPRESSION: No acute abnormality. Atrophy and chronic  microvascular ischemic change. Electronically Signed   By: Inge Rise M.D.   On: 03/07/2019 11:00    Procedures Procedures (including critical care time)  Medications Ordered in ED Medications  potassium chloride SA (KLOR-CON) CR tablet 40 mEq (40 mEq Oral Given 03/07/19 1228)  sodium chloride 0.9 % bolus 500 mL (500 mLs Intravenous New Bag/Given 03/07/19 1220)  0.9 %  sodium chloride infusion ( Intravenous New Bag/Given 03/07/19 1223)     Initial Impression / Assessment and Plan / ED Course  I have reviewed the triage vital signs and the nursing notes.  Pertinent labs & imaging results that were available during my care of the patient were reviewed by me and considered in my medical decision making (see chart for details).  EKG: Sinus rhythm, low voltage, precordial leads, borderline prolonged QT interval.   CT head: pending No acute abnormality. Atrophy and chronic microvascular ischemic change.   Alyssa [name] is a 83 y.o. female who presented with sign and symptoms concerning for Rhabdomyolysis. On arrival she was confused, low diastolic BP ([id number]), hypoglycemia (30's), and hypothermic (32F). She has a recent history of multiple falls with unknown LOC or head trauma.  - No leukocytosis or anemia - Hypokalemic at 3.2 given 40 mEqs of KCL - TSH 1.195 - CT head negative - CK >500 (given 500 cc of NS and start fluids at 125 cc/hr   Final Clinical Impressions(s) / ED Diagnoses   Final diagnoses:  Hypokalemia  Altered mental status, unspecified altered mental status type  Hypothermia, initial encounter  Hypoglycemia  ED Discharge Orders    None       Marianna Payment, MD 03/07/19 [place], Wenda Overland, MD 03/13/19 2010

## 2019-03-07 NOTE — Progress Notes (Signed)
Paged for visual hallucinations. Presented to the bedside to see the patient. She is alert and oriented to person place and time but does not recall meeting myself or Dr. Madilyn Fireman earlier. We continues to endorse seeing a small black dog on top of the sharps container. It is not disturbing to her. No auditory hallucinations. She is otherwise hemodynamically stable and PE is unchanged compared to prior. Her visual hallucinations are likely secondary to delirium in combination with her neurodegenerative process (Alzhiemer's disease) but we will continue to rule out organic causes. No change in treatment plan.   Ina Homes, MD IMTS PGY3

## 2019-03-07 NOTE — ED Notes (Addendum)
Pt CBG 45, pt given 50% dextrose IV. RN unable to obtain labs per MD order due to glucose being 45.
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
## 2019-03-07 NOTE — ED Notes (Signed)
Lab called this RN for critical lactic acid of 2.3

## 2019-03-07 NOTE — ED Notes (Signed)
Pt given orange juice and crackers

## 2019-03-07 NOTE — ED Notes (Signed)
Patient transported to CT 

## 2019-03-07 NOTE — ED Notes (Signed)
Current CBG is 97, patient being place on purewick and warming blanket

## 2019-03-08 DIAGNOSIS — R58 Hemorrhage, not elsewhere classified: Secondary | ICD-10-CM

## 2019-03-08 DIAGNOSIS — E11649 Type 2 diabetes mellitus with hypoglycemia without coma: Secondary | ICD-10-CM

## 2019-03-08 DIAGNOSIS — G9341 Metabolic encephalopathy: Secondary | ICD-10-CM

## 2019-03-08 DIAGNOSIS — E162 Hypoglycemia, unspecified: Secondary | ICD-10-CM | POA: Diagnosis present

## 2019-03-08 DIAGNOSIS — E039 Hypothyroidism, unspecified: Secondary | ICD-10-CM

## 2019-03-08 DIAGNOSIS — Z79890 Hormone replacement therapy (postmenopausal): Secondary | ICD-10-CM

## 2019-03-08 DIAGNOSIS — I2510 Atherosclerotic heart disease of native coronary artery without angina pectoris: Secondary | ICD-10-CM

## 2019-03-08 DIAGNOSIS — E1151 Type 2 diabetes mellitus with diabetic peripheral angiopathy without gangrene: Secondary | ICD-10-CM

## 2019-03-08 DIAGNOSIS — F0390 Unspecified dementia without behavioral disturbance: Secondary | ICD-10-CM

## 2019-03-08 DIAGNOSIS — E785 Hyperlipidemia, unspecified: Secondary | ICD-10-CM

## 2019-03-08 DIAGNOSIS — Z955 Presence of coronary angioplasty implant and graft: Secondary | ICD-10-CM

## 2019-03-08 DIAGNOSIS — Z79899 Other long term (current) drug therapy: Secondary | ICD-10-CM

## 2019-03-08 DIAGNOSIS — Z7902 Long term (current) use of antithrombotics/antiplatelets: Secondary | ICD-10-CM

## 2019-03-08 DIAGNOSIS — T68XXXA Hypothermia, initial encounter: Secondary | ICD-10-CM

## 2019-03-08 DIAGNOSIS — I5032 Chronic diastolic (congestive) heart failure: Secondary | ICD-10-CM

## 2019-03-08 DIAGNOSIS — Z794 Long term (current) use of insulin: Secondary | ICD-10-CM

## 2019-03-08 DIAGNOSIS — I252 Old myocardial infarction: Secondary | ICD-10-CM

## 2019-03-08 DIAGNOSIS — Z7982 Long term (current) use of aspirin: Secondary | ICD-10-CM

## 2019-03-08 DIAGNOSIS — I110 Hypertensive heart disease with heart failure: Secondary | ICD-10-CM

## 2019-03-08 DIAGNOSIS — F329 Major depressive disorder, single episode, unspecified: Secondary | ICD-10-CM

## 2019-03-08 LAB — GLUCOSE, CAPILLARY
Glucose-Capillary: 122 mg/dL — ABNORMAL HIGH (ref 70–99)
Glucose-Capillary: 145 mg/dL — ABNORMAL HIGH (ref 70–99)
Glucose-Capillary: 150 mg/dL — ABNORMAL HIGH (ref 70–99)
Glucose-Capillary: 154 mg/dL — ABNORMAL HIGH (ref 70–99)
Glucose-Capillary: 171 mg/dL — ABNORMAL HIGH (ref 70–99)
Glucose-Capillary: 182 mg/dL — ABNORMAL HIGH (ref 70–99)
Glucose-Capillary: 193 mg/dL — ABNORMAL HIGH (ref 70–99)
Glucose-Capillary: 208 mg/dL — ABNORMAL HIGH (ref 70–99)
Glucose-Capillary: 221 mg/dL — ABNORMAL HIGH (ref 70–99)

## 2019-03-08 LAB — CORTISOL-AM, BLOOD: Cortisol - AM: 3.7 ug/dL — ABNORMAL LOW (ref 6.7–22.6)

## 2019-03-08 LAB — URINE CULTURE

## 2019-03-08 MED ORDER — RAMELTEON 8 MG PO TABS
8.0000 mg | ORAL_TABLET | Freq: Every day | ORAL | Status: DC
  Filled 2019-03-08 (×2): qty 1

## 2019-03-08 MED ORDER — COSYNTROPIN 0.25 MG IJ SOLR
0.2500 mg | Freq: Once | INTRAMUSCULAR | Status: AC
  Administered 2019-03-09: 11:00:00 0.25 mg via INTRAVENOUS
  Filled 2019-03-08: qty 0.25

## 2019-03-08 MED ORDER — SODIUM CHLORIDE 0.9 % IV SOLN
1.0000 g | INTRAVENOUS | Status: DC
  Administered 2019-03-08 – 2019-03-09 (×2): 1 g via INTRAVENOUS
  Filled 2019-03-08 (×2): qty 1

## 2019-03-08 MED ORDER — COSYNTROPIN 0.25 MG IJ SOLR: 0.2500 mg | Freq: Once | INTRAMUSCULAR | Status: DC

## 2019-03-08 MED ORDER — ESCITALOPRAM OXALATE 20 MG PO TABS
20.0000 mg | ORAL_TABLET | Freq: Every evening | ORAL | Status: DC
  Administered 2019-03-08: 18:00:00 20 mg via ORAL
  Filled 2019-03-08: qty 1

## 2019-03-08 NOTE — Progress Notes (Addendum)
Subjective: Patient doing well this AM. Alyssa [name] has a good appetite and has eaten well since getting here. Alyssa [name] does have some urgency and dysuria. Alyssa [name] tells [place] that Alyssa [name] does have some depression and several years ago was diagnosed with dementia. He husband subsequently told people Alyssa [name] wasn't sane. This has caused hardship and Alyssa [name] is nervous to increase her medications because of his comment. We discussed the diagnosis of depression and the option of changing medications. Alyssa [name] voices understanding and will think about further treatment for her depression.   Consults: None  Objective:  Vital signs in last 24 hours: Vitals:   03/07/19 1745 03/07/19 1815 03/07/19 2107 03/08/19 0419  BP: (!) 136/54 (!) 129/55 (!) 160/56 132/81  Pulse: 77 71 84 86  Resp: 19 20 16 18   Temp:   98.4 F (36.9 C) 97.7 F (36.5 C)  TempSrc:   Oral Oral  SpO2: 94% 95% 99% 96%  Weight:      Height:        Physical Exam  Constitutional: Alyssa [name] is well-developed, well-nourished, and in no distress.  HENT:  Head: Normocephalic.  ecchymoses on forehead  Cardiovascular: Normal rate, regular rhythm and normal heart sounds.  No murmur heard. Pulmonary/Chest: Effort normal and breath sounds normal. No respiratory distress.  Abdominal: Soft. Bowel sounds are normal. Alyssa [name] exhibits no distension.  Musculoskeletal: Normal range of motion.        General: No edema.  Neurological: Alyssa [name] is alert.  Oriented to self and year  Skin: Skin is warm and dry.  ecchymoses on bilateral posterior arms   Nursing note and vitals reviewed.   I/Os:  Intake/Output Summary (Last 24 hours) at 03/08/2019 1244 Last data filed at 03/08/2019 [id number] Gross per 24 hour  Intake 1840 ml  Output   Net 1840 ml   Labs: Results for orders placed or performed during the hospital encounter of 03/07/19 (from the past 24 hour(s))  Lactic acid, plasma     Status: None   Collection Time: 03/07/19  1:22 PM  Result Value Ref Range   Lactic Acid, Venous  1.0 0.5 - 1.9 mmol/L  POC CBG, ED     Status: Abnormal   Collection Time: 03/07/19  3:10 PM  Result Value Ref Range   Glucose-Capillary 45 (L) 70 - 99 mg/dL   Comment 1 Notify RN    Comment 2 Document in Chart   CBG monitoring, ED     Status: Abnormal   Collection Time: 03/07/19  3:58 PM  Result Value Ref Range   Glucose-Capillary 174 (H) 70 - 99 mg/dL  CBG monitoring, ED     Status: Abnormal   Collection Time: 03/07/19  4:52 PM  Result Value Ref Range   Glucose-Capillary 165 (H) 70 - 99 mg/dL  Urinalysis, Routine w reflex microscopic     Status: Abnormal   Collection Time: 03/07/19  5:30 PM  Result Value Ref Range   Color, Urine YELLOW YELLOW   APPearance HAZY (A) CLEAR   Specific Gravity, Urine 1.012 1.005 - 1.030   pH 5.0 5.0 - 8.0   Glucose, UA 50 (A) NEGATIVE mg/dL   Hgb urine dipstick SMALL (A) NEGATIVE   Bilirubin Urine NEGATIVE NEGATIVE   Ketones, ur NEGATIVE NEGATIVE mg/dL   Protein, ur NEGATIVE NEGATIVE mg/dL   Nitrite NEGATIVE NEGATIVE   Leukocytes,Ua MODERATE (A) NEGATIVE   RBC / HPF 0-5 0 - 5 RBC/hpf   WBC, UA 6-10 0 - 5 WBC/hpf   Bacteria, UA RARE (  A) NONE SEEN   Squamous Epithelial / LPF 0-5 0 - 5   Mucus PRESENT   CBG monitoring, ED     Status: Abnormal   Collection Time: 03/07/19  7:41 PM  Result Value Ref Range   Glucose-Capillary 147 (H) 70 - 99 mg/dL  Glucose, capillary     Status: Abnormal   Collection Time: 03/07/19  9:48 PM  Result Value Ref Range   Glucose-Capillary 206 (H) 70 - 99 mg/dL  Glucose, capillary     Status: Abnormal   Collection Time: 03/08/19 12:02 AM  Result Value Ref Range   Glucose-Capillary 193 (H) 70 - 99 mg/dL  Glucose, capillary     Status: Abnormal   Collection Time: 03/08/19  2:00 AM  Result Value Ref Range   Glucose-Capillary 171 (H) 70 - 99 mg/dL  Glucose, capillary     Status: Abnormal   Collection Time: 03/08/19  4:16 AM  Result Value Ref Range   Glucose-Capillary 154 (H) 70 - 99 mg/dL  Glucose, capillary      Status: Abnormal   Collection Time: 03/08/19  6:16 AM  Result Value Ref Range   Glucose-Capillary 145 (H) 70 - 99 mg/dL  Cortisol-am, blood     Status: Abnormal   Collection Time: 03/08/19  8:01 AM  Result Value Ref Range   Cortisol - AM 3.7 (L) 6.7 - 22.6 ug/dL  Glucose, capillary     Status: Abnormal   Collection Time: 03/08/19  8:22 AM  Result Value Ref Range   Glucose-Capillary 150 (H) 70 - 99 mg/dL  Glucose, capillary     Status: Abnormal   Collection Time: 03/08/19 10:29 AM  Result Value Ref Range   Glucose-Capillary 221 (H) 70 - 99 mg/dL  Glucose, capillary     Status: Abnormal   Collection Time: 03/08/19 12:02 PM  Result Value Ref Range   Glucose-Capillary 208 (H) 70 - 99 mg/dL    Assessment/Plan:  Assessment:   Alyssa [name] is a 83 y.o female with DM on insulin therapy, HFpEF, CAD, HTN, PVD, hypothyroidism, dementia and depression who presented to the ED with AMS, hypoglycemia to the 40s and hypothermia in the setting of a recent increase in fatigue, decreased po intake and multiple falls whose presentation is concerning for progressive neurological decline vs. Infection vs. Adrenal insufficiency.     Plan by Problem:   Active Problems:   Acute metabolic encephalopathy due to hypoglycemia -patient with history of DM on insulin therapy, HFpEF, CAD, HTN, PVD, hypothyroidism, dementia and depression with steady decline in the memory and increase in depression recently with associated increased sleep and decreased appetite with multiple episodes of falls over the last 3-4 months presenting with altered mental status, hypoglycemia and hypothermia concerning for receiving too much insulin in setting of decreased p.o. intake -admission blood work and imaging unremarkable; UA did show some bacteria and leukocytes concerning for UTI in setting of reported urgency episodes of incontinence -Because of the patient's symptoms may be multifactorial; the patient has had a steady decline in  memory and symptoms could be associated with decreased neurological function; the patient has underlying hypothyroidism which can present with such symptoms, however, TSH was normal and T4 was slightly elevated; these symptoms could be secondary to depression which, per a note from May 2020, was worsening for the patient at that time. -Specifically, hypothermia could be secondary to infection, adrenal insufficiency or hypoglycemia.  Hypoglycemia previously presumed most likely given findings and patient without infectious symptoms per daughter.  However, UA concerning for infection and morning cortisol level was low.  -Fatigue, anorexia, hypoglycemia, dementia and depression can all be explained by adrenal insufficiency.  -While this patient presented with an acute episode of hypoglycemia, it appears to be in the setting of progressive decline in functional status, decreased ability to complete ADLs on her own, increased memory impairment and depression which could be organic decline in neurologic status or it could all be explained by adrenal insufficiency.     Plan: -ACTH and ACTH stimulation test to further evaluate for adrenal insufficiency -Ceftriaxone for UTI -Continue CBG monitoring -Follow-up blood cultures -Follow-up urine cultures -Continue home dementia and depression meds   Type 2 Diabetes with hypoglycemia: -Patient on Levemir, 64 units according to her med history and Metformin 500 daily -Patient has had decreased p.o. recently and daughter has been titrating as best Alyssa [name] can, sometimes giving 30 to units instead of 64 -Currently not on insulin inpatient given symptomatic hypoglycemia on admission   Plan: CBG monitoring   Depression: -Per a May 2020 note, daughter states patient continues to struggle with depression; patient also presenting with increased sleepiness and decreased appetite  -May patient reported increased depression secondary to being called insane by her husband  after being diagnosed with dementia -Currently on Lexapro 10 mg daily; patient is amenable to additional medication to assist with depression   Plan: -Continue Lexapro 10 mg daily -will look into safe depression medications to add or switch to given her age and comorbidities  -? If adrenal insufficiency could be worsening   Hypothyroidism: -Patient with known hypothyroidism on levothyroxine on 125 mcg daily -TSH within normal limits 01.195.  T4 slightly elevated at 1.30. -Given increased T4, will decrease levothyroxine dose to 100 daily   CAD -History of 2 prior Mis with stents in all major vessels  -Continue aspirin 81 mg, Plavix 75 mg and HTN/HLD meds as below   Chronic HFpEF -last echo 04/02/2013 with LVEF of 60-65% -euvolemic on exam without sx of volume overload   -on lasix 20 mg daily, holding due to soft bp here   HTN -on losartan 50 mg daily and coreg 3.125 mg daily which we are holding secondary to soft bp here    HLD -Continue Lipitor 80 mg, Zetia 10 mg   Dispo: Admit patient to Inpatient with expected length of stay greater than 2 midnights.  Al Decant, MD 03/08/2019, 12:44 PM Pager: [phone]

## 2019-03-08 NOTE — Evaluation (Signed)
Occupational Therapy Evaluation Patient Details Name: Alyssa [name] MRN: [id number] DOB: 1935-05-04 Today's Date: 03/08/2019    History of Present Illness Alyssa [name] is a 83 y.o female with HFpEF, CAD, DM on insulin therapy, HTN, PVD, and hypothyroidism who presented to the ED with AMS. History was obtained via the patient, through chart review, and through collateral with her husband and daughter. Pt with hypoglycemia.     Clinical Impression   Pt admitted with above diagnoses, presenting with cognitive deficits and decreased activity tolerance and balance with BADL. Pt is poor historian at times, but states that family manages IADL. At time of eval, she is able to complete bed mobility at mod I and transfers and functional mobility at min guard- min A with RW. Pt presents with deficits in processing, problem solving, safety awareness, and topographical orientation. She needed several safety cues throughout session. Completed trail making in hallway, pt not able to find room despite verbal, visual, and gestural cues. Recommend HHOT with 24/7 assist, uif 24/7 assist cannot be provided may consider SNF. Will continue to follow per POC listed below.    Follow Up Recommendations  Home health OT;Supervision/Assistance - 24 hour    Equipment Recommendations  None recommended by OT    Recommendations for Other Services       Precautions / Restrictions Precautions Precautions: Fall Restrictions Weight Bearing Restrictions: No      Mobility Bed Mobility Overal bed mobility: Modified Independent             General bed mobility comments: use of rails  Transfers Overall transfer level: Needs assistance Equipment used: Rolling walker (2 wheeled) Transfers: Sit to/from Stand Sit to Stand: Min guard         General transfer comment: min guard to steady, cues for safety with hand placement    Balance Overall balance assessment: Needs assistance Sitting-balance support: No  upper extremity supported;Feet supported Sitting balance-[name]: Fair     Standing balance support: Bilateral upper extremity supported;During functional activity Standing balance-[name]: Poor Standing balance comment: Pt needs UE support for balance.  Needed min assist when pt didnt have UE support at sink.                            ADL either performed or assessed with clinical judgement   ADL Overall ADL's : Needs assistance/impaired Eating/Feeding: Set up;Sitting   Grooming: Minimal assistance;Standing   Upper Body Bathing: Sitting;Min guard   Lower Body Bathing: Min guard;Sitting/lateral leans;Sit to/from stand Lower Body Bathing Details (indicate cue type and reason): limited standing balance Upper Body Dressing : Min guard;Sitting   Lower Body Dressing: Min guard Lower Body Dressing Details (indicate cue type and reason): limited standing balance Toilet Transfer: Minimal assistance;Grab bars;RW;Ambulation   Toileting- Clothing Manipulation and Hygiene: Minimal assistance;Sitting/lateral lean;Sit to/from stand   Tub/ Shower Transfer: Minimal assistance;Shower seat;Ambulation;Rolling walker   Functional mobility during ADLs: Minimal assistance;Cueing for safety;Cueing for sequencing General ADL Comments: pt limited by decreased cognition, generalized weakness, and limited balance     Vision Baseline Vision/History: Wears glasses Wears Glasses: At all times Patient Visual Report: No change from baseline       Perception     Praxis      Pertinent Vitals/Pain Pain Assessment: No/denies pain     Hand Dominance Right   Extremity/Trunk Assessment Upper Extremity Assessment Upper Extremity Assessment: Generalized weakness   Lower Extremity Assessment Lower Extremity Assessment: Defer to  PT evaluation   Cervical / Trunk Assessment Cervical / Trunk Assessment: Normal   Communication Communication Communication: No difficulties    Cognition Arousal/Alertness: Awake/alert Behavior During Therapy: Restless Overall Cognitive Status: Impaired/Different from baseline Area of Impairment: Orientation;Memory;Following commands;Safety/judgement;Awareness;Problem solving                 Orientation Level: Disoriented to;Situation;Time   Memory: Decreased short-term memory Following Commands: Follows one step commands inconsistently;Follows one step commands with increased time Safety/Judgement: Decreased awareness of safety;Decreased awareness of deficits   Problem Solving: Slow processing;Decreased initiation;Difficulty sequencing;Requires verbal cues;Requires tactile cues General Comments: thought it was October, unsure of why in hospital; confabulates, could not complete trail making despite consistent cues. Short direct cues needed to process throughout   General Comments       Exercises     Shoulder Instructions      Home Living Family/patient expects to be discharged to:: Private residence Living Arrangements: Spouse/significant other Available Help at Discharge: Family;Available 24 hours/day Type of Home: House Home Access: Stairs to enter [employer] of Steps: 4 Entrance Stairs-Rails: Right Home Layout: One level     Bathroom Shower/Tub: Occupational psychologist: Standard Bathroom Accessibility: No   Home Equipment: Environmental consultant - 2 wheels;Cane - single point;Bedside commode          Prior Functioning/Environment Level of Independence: Needs assistance  Gait / Transfers Assistance Needed: used Rw and cane at times but not when she fell.   ADL's / Homemaking Assistance Needed: pt does her own bathing and dressing            OT Problem List: Decreased strength;Decreased knowledge of use of DME or AE;Decreased activity tolerance;Decreased cognition;Impaired balance (sitting and/or standing);Decreased safety awareness      OT Treatment/Interventions: Self-care/ADL  training;Therapeutic exercise;Patient/family education;Balance training;Energy conservation;Therapeutic activities;DME and/or AE instruction;Cognitive remediation/compensation    OT Goals(Current goals can be found in the care plan section) Acute Rehab OT Goals Patient Stated Goal: to go home OT Goal Formulation: With patient Time For Goal Achievement: 03/22/19 Potential to Achieve Goals: Good  OT Frequency: Min 2X/week   Barriers to D/C:            Co-evaluation              AM-PAC OT "6 Clicks" Daily Activity     Outcome Measure Help from another person eating meals?: A Little Help from another person taking care of personal grooming?: A Little Help from another person toileting, which includes using toliet, bedpan, or urinal?: A Little Help from another person bathing (including washing, rinsing, drying)?: A Little Help from another person to put on and taking off regular upper body clothing?: A Little Help from another person to put on and taking off regular lower body clothing?: A Little 6 Click Score: 18   End of Session Equipment Utilized During Treatment: Gait belt;Rolling walker Nurse Communication: Mobility status  Activity Tolerance: Patient tolerated treatment well Patient left: in chair;with call bell/phone within reach;with chair alarm set  OT Visit Diagnosis: Other abnormalities of gait and mobility (R26.89);Unsteadiness on feet (R26.81);Muscle weakness (generalized) (M62.81);Other symptoms and signs involving cognitive function;History of falling (Z91.81)                Time: 1005-1020 OT Time Calculation (min): 15 min Charges:  OT General Charges $OT Visit: 1 Visit OT Evaluation $OT Eval Low Complexity: 1 Low  Zenovia Jarred, MSOT, OTR/L [place] OT/ Acute Relief OT [facility] Office: [phone]  [facility]  Ladainian Therien 03/08/2019, 1:27 PM

## 2019-03-08 NOTE — TOC Initial Note (Signed)
Transition of Care [facility]) - Initial/Assessment Note    Patient Details  Name: Alyssa [name] MRN: [id number] Date of Birth: [birth year]  Transition of Care [facility]) CM/SW Contact:    Alexander Mt, [place] Phone Number: 03/08/2019, 11:28 AM  Clinical Narrative:                 CSW spoke with pt daughter [name] via telephone. Introduced self, role, and reason for call. Pt from home with her spouse; her daughter and son in law provide assistance to both as well. Confirmed home address and PCP which has switched to Dr. Ernestene Kiel at [facility]. CSW has updated this in system. Pt usually uses a cane and more recently a walker to ambulate.   CSW discussed recommendations for [facility] services and 24/7 supervision. Pt daughter amenable to pt returning home with these services and support of both pt husband and her/pt son in law. Pt son owns a [employer] agency named [employer]. Pt daughter open to email being sent to [email or web address] with Medicare ratings list of [place] agencies in [place]. Encouraged pt daughter to pick a top 3-4 agency list and CSW will f/u. Pt daughter also requesting f/u call as pt doctors able (requst relayed to Dr. Madilyn Fireman).   TOC team continues to follow.  Expected Discharge Plan: [place] Barriers to Discharge: Continued Medical Work up   Patient Goals and CMS Choice Patient states their goals for this hospitalization and ongoing recovery are:: to be able to bring her home and arrange more help there CMS Medicare.gov Compare Post Acute Care list provided to:: Patient Represenative (must comment)(pt daughter) Choice offered to / list presented to : Adult Children  Expected Discharge Plan and Services Expected Discharge Plan: [place] In-house Referral: Clinical Social Work Discharge Planning Services: CM Consult Post Acute Care Choice: [place] Living arrangements for the past 2 months: Single Family  Home HH Arranged: PT, Nurse's Aide  Prior Living Arrangements/Services Living arrangements for the past 2 months: Single Family Home Lives with:: Spouse Patient language and need for interpreter reviewed:: Yes Do you feel safe going back to the place where you live?: Yes      Need for Family Participation in Patient Care: Yes (Comment)(supervision/assistance) Care giver support system in place?: Yes (comment)(pt spouse/pt daughter and son in law) Current home services: DME Criminal Activity/Legal Involvement Pertinent to Current Situation/Hospitalization: No - Comment as needed  Permission Sought/Granted Permission sought to share information with : Family Supports, PCP Permission granted to share information with : No(pt oriented to self and place but fluctuates)  Share Information with NAME: [name]  Permission granted to share info w AGENCY: PCP/HH agencies  Permission granted to share info w Relationship: daughter  Permission granted to share info w Contact Information: [phone]  Emotional Assessment Appearance:: Other (Comment Required(telephonic assessment with pt daughter) Attitude/Demeanor/Rapport: (telephonic assessment with pt daughter) Affect (typically observed): (telephonic assessment with pt daughter) Orientation: : Oriented to Self, Oriented to Place, Fluctuating Orientation (Suspected and/or reported Sundowners) Alcohol / Substance Use: Not Applicable Psych Involvement: No (comment)  Admission diagnosis:  Hypokalemia [E87.6] Hypoglycemia [E16.2] Chest pain [R07.9] Hypothermia, initial encounter [T68.XXXA] Altered mental status, unspecified altered mental status type [R41.82] Patient Active Problem List   Diagnosis Date Noted  . Hypoglycemia 03/07/2019  . Memory disorder 10/06/2017  . Insulin dependent diabetes mellitus 11/19/2016  . Chest pain 11/18/2016  . Diabetes mellitus without complication ([place]) [id number]  .  Dysthymia 06/20/2014  .  Hypertensive heart disease 04/11/2014  . Anemia   . History of GI bleed   . CAD (coronary artery disease)   . HLD (hyperlipidemia)   . Peripheral vascular disease ([place])   . Chronic diastolic CHF (congestive heart failure) ([place])   . Essential hypertension   . NSTEMI (non-ST elevated myocardial infarction) ([place]) 04/01/2014  . Refusal of blood transfusions as patient is Jehovah's Witness 09/29/2012  . Blood loss anemia 02/26/2012  . Melena 02/25/2012  . GERD (gastroesophageal reflux disease) 11/11/2011  . DM type 2 (diabetes mellitus, type 2) ([place]) 05/26/2011  . Hypothyroid    PCP:  Isaias Sakai, DO Pharmacy:   [facility], [place] [address] [place] [address] Phone: [phone] Fax: [phone]  [id number] - [place], [place] [address] Phone: [phone] Fax: [phone]     Social Determinants of Health (SDOH) Interventions    Readmission Risk Interventions Readmission Risk Prevention Plan 03/08/2019  Post Dischage Appt Not Complete  Appt Comments await discharge timing  Medication Screening Complete  Transportation Screening Complete  Some recent data might be hidden

## 2019-03-08 NOTE — Evaluation (Signed)
Physical Therapy Evaluation Patient Details Name: Alyssa [name] MRN: [id number] DOB: 07-Dec-1935 Today's Date: 03/08/2019   History of Present Illness  Alyssa [name] is a 83 y.o female with HFpEF, CAD, DM on insulin therapy, HTN, PVD, and hypothyroidism who presented to the ED with AMS. History was obtained via the patient, through chart review, and through collateral with her husband and daughter. Pt with hypoglycemia.    Clinical Impression  Pt admitted with above diagnosis. Pt was able to ambulate with RW with cues. Will need 24 hour care due to confusion.  If she does not have 24 hour, may need SNF.  Pt currently with functional limitations due to the deficits listed below (see PT Problem List). Pt will benefit from skilled PT to increase their independence and safety with mobility to allow discharge to the venue listed below.      Follow Up Recommendations Home health PT;Supervision/Assistance - 24 hour    Equipment Recommendations  None recommended by PT    Recommendations for Other Services       Precautions / Restrictions Precautions Precautions: Fall Restrictions Weight Bearing Restrictions: No      Mobility  Bed Mobility Overal bed mobility: Independent                Transfers Overall transfer level: Needs assistance Equipment used: Rolling walker (2 wheeled) Transfers: Sit to/from Stand Sit to Stand: Min guard         General transfer comment: Cues only as pt not unsteady to standing and did not need cues for hand placement.   Ambulation/Gait Ambulation/Gait assistance: Min assist Gait Distance (Feet): 150 Feet Assistive device: Rolling walker (2 wheeled) Gait Pattern/deviations: Step-through pattern;Decreased stride length   Gait velocity interpretation: <1.31 ft/sec, indicative of household ambulator General Gait Details: cues needed to stay close to RW.  Decr safety at times needing someone present to use the RW correctly.   Stairs             Wheelchair Mobility    Modified Rankin (Stroke Patients Only)       Balance Overall balance assessment: Needs assistance Sitting-balance support: No upper extremity supported;Feet supported Sitting balance-[name]: Fair     Standing balance support: Bilateral upper extremity supported;During functional activity Standing balance-[name]: Poor Standing balance comment: Pt needs UE support for balance.  Needed min assist when pt didnt have UE support at sink.                              Pertinent Vitals/Pain Pain Assessment: No/denies pain    Home Living Family/patient expects to be discharged to:: Private residence Living Arrangements: Spouse/significant other Available Help at Discharge: Family;Available 24 hours/day Type of Home: House Home Access: Stairs to enter Entrance Stairs-Rails: Right Entrance Stairs-Number of Steps: 4 Home Layout: One level Home Equipment: Walker - 2 wheels;Cane - single point;Bedside commode      Prior Function Level of Independence: Needs assistance   Gait / Transfers Assistance Needed: used Rw and cane at times but not when she fell.    ADL's / Homemaking Assistance Needed: pt does her own bathing and dressing        Hand Dominance   Dominant Hand: Right    Extremity/Trunk Assessment   Upper Extremity Assessment Upper Extremity Assessment: Defer to OT evaluation    Lower Extremity Assessment Lower Extremity Assessment: Generalized weakness    Cervical / Trunk Assessment Cervical / Trunk  Assessment: Normal  Communication   Communication: No difficulties  Cognition Arousal/Alertness: Awake/alert Behavior During Therapy: Anxious Overall Cognitive Status: Impaired/Different from baseline Area of Impairment: Orientation;Memory;Following commands;Safety/judgement;Awareness                 Orientation Level: Disoriented to;Situation;Time   Memory: Decreased short-term memory Following Commands:  Follows one step commands inconsistently Safety/Judgement: Decreased awareness of safety;Decreased awareness of deficits     General Comments: thought it was October, unsure of why in hospital; confabulates, could not find room even with  cues.        General Comments      Exercises     Assessment/Plan    PT Assessment Patient needs continued PT services  PT Problem List Decreased activity tolerance;Decreased balance;Decreased mobility;Decreased knowledge of use of DME;Decreased safety awareness;Decreased knowledge of precautions       PT Treatment Interventions DME instruction;Gait training;Functional mobility training;Therapeutic activities;Therapeutic exercise;Balance training;Patient/family education    PT Goals (Current goals can be found in the Care Plan section)  Acute Rehab PT Goals Patient Stated Goal: to go home PT Goal Formulation: With patient Time For Goal Achievement: 03/22/19 Potential to Achieve Goals: Good    Frequency Min 3X/week   Barriers to discharge        Co-evaluation               AM-PAC PT "6 Clicks" Mobility  Outcome Measure Help needed turning from your back to your side while in a flat bed without using bedrails?: None Help needed moving from lying on your back to sitting on the side of a flat bed without using bedrails?: None Help needed moving to and from a bed to a chair (including a wheelchair)?: A Little Help needed standing up from a chair using your arms (e.g., wheelchair or bedside chair)?: A Little Help needed to walk in hospital room?: A Little Help needed climbing 3-5 steps with a railing? : A Little 6 Click Score: 20    End of Session Equipment Utilized During Treatment: Gait belt Activity Tolerance: Patient limited by fatigue Patient left: in chair;with call bell/phone within reach;with chair alarm set Nurse Communication: Mobility status PT Visit Diagnosis: Muscle weakness (generalized) (M62.81)    Time:  0950-1005 PT Time Calculation (min) (ACUTE ONLY): 15 min   Charges:   PT Evaluation $PT Eval Low Complexity: 1 Low          Ailyn Gladd W,PT Acute Rehabilitation Services Pager:  [phone]  Office:  [phone]    Denice Paradise 03/08/2019, 10:27 AM

## 2019-03-08 NOTE — Progress Notes (Signed)
Tele sitter order put in but not currently available on unit awaiting equipment. Patient got out of bed when this nurse left the room and was wandering the halls looking for the bathroom. This nurse assisted patient back to room and tech assisted her with the BR while this nurse paged Dr. Sheppard Coil and made aware. Patient currently refusing to taking anything by mouth.

## 2019-03-09 ENCOUNTER — Inpatient Hospital Stay (HOSPITAL_COMMUNITY): Payer: PPO

## 2019-03-09 DIAGNOSIS — N390 Urinary tract infection, site not specified: Secondary | ICD-10-CM

## 2019-03-09 DIAGNOSIS — Z887 Allergy status to serum and vaccine status: Secondary | ICD-10-CM

## 2019-03-09 DIAGNOSIS — E2740 Unspecified adrenocortical insufficiency: Secondary | ICD-10-CM | POA: Diagnosis present

## 2019-03-09 LAB — ACTH STIMULATION, 3 TIME POINTS
Cortisol, 30 Min: 7.1 ug/dL
Cortisol, 60 Min: 8.7 ug/dL
Cortisol, Base: 6.6 ug/dL

## 2019-03-09 LAB — GLUCOSE, CAPILLARY
Glucose-Capillary: 161 mg/dL — ABNORMAL HIGH (ref 70–99)
Glucose-Capillary: 165 mg/dL — ABNORMAL HIGH (ref 70–99)
Glucose-Capillary: 207 mg/dL — ABNORMAL HIGH (ref 70–99)
Glucose-Capillary: 194 mg/dL — ABNORMAL HIGH (ref 70–99)

## 2019-03-09 LAB — CORTISOL: Cortisol, Plasma: 26.2 ug/dL

## 2019-03-09 LAB — ACTH: C206 ACTH: 9.1 pg/mL (ref 7.2–63.3)

## 2019-03-09 MED ORDER — HYDROCORTISONE 5 MG PO TABS: 5.0000 mg | ORAL_TABLET | ORAL | 0 refills | Status: AC

## 2019-03-09 MED ORDER — LEVOTHYROXINE SODIUM 100 MCG PO TABS: 100.0000 ug | ORAL_TABLET | Freq: Every day | ORAL | 0 refills | Status: DC

## 2019-03-09 MED ORDER — GADOBUTROL 1 MMOL/ML IV SOLN
6.0000 mL | Freq: Once | INTRAVENOUS | Status: AC | PRN
  Administered 2019-03-09: 6 mL via INTRAVENOUS

## 2019-03-09 MED ORDER — ESCITALOPRAM OXALATE 20 MG PO TABS: 20.0000 mg | ORAL_TABLET | Freq: Every evening | ORAL | 0 refills | Status: AC
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
## 2019-03-09 NOTE — Progress Notes (Signed)
Physical Therapy Treatment Patient Details Name: ASHIANA STARLIN MRN: [id number] DOB: 07-01-1935 Today's Date: 03/09/2019    History of Present Illness Killian Shimazu is a 83 y.o female with HFpEF, CAD, DM on insulin therapy, HTN, PVD, and hypothyroidism who presented to the ED with AMS. History was obtained via the patient, through chart review, and through collateral with her husband and daughter. Pt with hypoglycemia.      PT Comments    Pt admitted with above diagnosis. Pt was able to ambulate 450 feet with RW with min guard assist.  Pt with fair safety with RW overall.  Still needs 24 hour care due to poor cognition.  Will continue to follow acutely.   Pt currently with functional limitations due to the deficits listed below (see PT Problem List). Pt will benefit from skilled PT to increase their independence and safety with mobility to allow discharge to the venue listed below.     Follow Up Recommendations  Home health PT;Supervision/Assistance - 24 hour     Equipment Recommendations  None recommended by PT    Recommendations for Other Services       Precautions / Restrictions Precautions Precautions: Fall Restrictions Weight Bearing Restrictions: No    Mobility  Bed Mobility               General bed mobility comments:  In chair on arrival  Transfers Overall transfer level: Needs assistance Equipment used: Rolling walker (2 wheeled) Transfers: Sit to/from Stand Sit to Stand: Min guard         General transfer comment: min guard to steady, cues for safety with hand placement  Ambulation/Gait Ambulation/Gait assistance: Min guard Gait Distance (Feet): 450 Feet Assistive device: Rolling walker (2 wheeled) Gait Pattern/deviations: Step-through pattern;Decreased stride length;Drifts right/left   Gait velocity interpretation: <1.31 ft/sec, indicative of household ambulator General Gait Details: cues needed to stay close to RW.  Decr safety at times needing  someone present to use the RW correctly but did do better than yesterday.    Stairs             Wheelchair Mobility    Modified Rankin (Stroke Patients Only)       Balance Overall balance assessment: Needs assistance Sitting-balance support: No upper extremity supported;Feet supported Sitting balance-[name]: Fair     Standing balance support: Bilateral upper extremity supported;During functional activity Standing balance-[name]: Poor Standing balance comment: Pt needs UE support for balance with challenges. Does better with use of RW.                             Cognition Arousal/Alertness: Awake/alert Behavior During Therapy: Restless Overall Cognitive Status: Impaired/Different from baseline Area of Impairment: Orientation;Memory;Following commands;Safety/judgement;Awareness;Problem solving                 Orientation Level: Disoriented to;Situation;Time   Memory: Decreased short-term memory Following Commands: Follows one step commands inconsistently;Follows one step commands with increased time Safety/Judgement: Decreased awareness of safety;Decreased awareness of deficits   Problem Solving: Slow processing;Decreased initiation;Difficulty sequencing;Requires verbal cues;Requires tactile cues General Comments: thought it was October, unsure of why in hospital; confabulates, could not complete trail making despite consistent cues. Short direct cues needed to process throughout      Exercises General Exercises - Lower Extremity Long Arc Quad: AROM;Both;10 reps;Seated    General Comments        Pertinent Vitals/Pain Pain Assessment: No/denies pain    Home  Living                      Prior Function            PT Goals (current goals can now be found in the care plan section) Acute Rehab PT Goals Patient Stated Goal: to go home Progress towards PT goals: Progressing toward goals    Frequency    Min  3X/week      PT Plan Current plan remains appropriate    Co-evaluation              AM-PAC PT "6 Clicks" Mobility   Outcome Measure  Help needed turning from your back to your side while in a flat bed without using bedrails?: None Help needed moving from lying on your back to sitting on the side of a flat bed without using bedrails?: None Help needed moving to and from a bed to a chair (including a wheelchair)?: A Little Help needed standing up from a chair using your arms (e.g., wheelchair or bedside chair)?: A Little Help needed to walk in hospital room?: A Little Help needed climbing 3-5 steps with a railing? : A Little 6 Click Score: 20    End of Session Equipment Utilized During Treatment: Gait belt Activity Tolerance: Patient limited by fatigue Patient left: in chair;with call bell/phone within reach;with chair alarm set Nurse Communication: Mobility status PT Visit Diagnosis: Muscle weakness (generalized) (M62.81)     Time: [id number] PT Time Calculation (min) (ACUTE ONLY): 14 min  Charges:  $Gait Training: 8-22 mins                     Fedor Kazmierski W,PT Acute Rehabilitation Services Pager:  [phone]  Office:  [place] 03/09/2019, 10:29 AM

## 2019-03-09 NOTE — Discharge Instructions (Signed)
You were admitted to the hospital after coming to the ED with confusion, low blood sugar and low body temperature.  You were treated with fluids with glucose to improve your blood sugar.  We did not give you any insulin while you were here.  Please do not take insulin when you go home until instructed to do so by your primary care doctor.  During your work-up we found an infection in your urine which was treated with IV antibiotics.  We also obtained lab work to investigate for something called adrenal insufficiency.  Your lab work was suggestive of this condition.  Thus, you are being discharged on a steroid called hydrocortisone which will replace the hormone which is low in your body. Please take 10 mg (two 5 mg pills) at 10 AM and 5 mg (one 5 mg pill) at 5 PM daily. If you are sick please double your dose for three days (20 mg in the morning and 10 mg in the afternoon) It may also be useful to get a medical bracelet showing you have adrenal insufficiency. Please follow-up with your primary care doctor within a week.  They can refer you to an endocrinologist for further work-up of your adrenal insufficiency and for management. If you want to call the Endocrinologist on your own, here is the contact information for one in [place].   [facility] End[facility] [address], [place], [place] [address] Phone: [phone]

## 2019-03-09 NOTE — TOC Progression Note (Signed)
Transition of Care [facility]) - Progression Note    Patient Details  Name: Alyssa [name] MRN: [id number] Date of Birth: 05-Jul-1935  Transition of Care [facility]) CM/SW [place], [place] Phone Number: 03/09/2019, 2:02 PM  Clinical Narrative:    CSW attempting to find [facility] for pt; spoke with pt daughter and she only is aware of [place] and [place]. She has not looked a list of agencies but is okay with further calls if both agencies unable to take.   Referrals made to [place], [facility], [place], [place], [place].   [facility] able to accept; due to holiday will be 24-48 hr delay to start of care.    Expected Discharge Plan: [place] Barriers to Discharge: Barriers Resolved  Expected Discharge Plan and Services Expected Discharge Plan: [place] In-house Referral: Clinical Social Work Discharge Planning Services: CM Consult Post Acute Care Choice: [place] arrangements for the past 2 months: [place] Expected Discharge Date: 03/09/19    [facility] Arranged: PT Date [place]: 03/09/19   Social Determinants of Health (SDOH) Interventions    Readmission Risk Interventions Readmission Risk Prevention Plan 03/08/2019  Post Dischage Appt Not Complete  Appt Comments await discharge timing  Medication Screening Complete  Transportation Screening Complete  Some recent data might be hidden

## 2019-03-09 NOTE — TOC Transition Note (Signed)
Transition of Care [facility]) - CM/SW Discharge Note   Patient Details  Name: [name] MRN: [id number] Date of Birth: 1936/01/28  Transition of Care [facility]) CM/SW Contact:  [place], [place] Phone Number: 03/09/2019, 3:52 PM   Clinical Narrative:    CSW has called the following agencies: [facility]- declined [place]- declined [facility]- declined [facility]- reviewed but unable to staff [facility]- under review [facility]- referral faxed for review [facility]- declined  CSW await return call from either [place] or [place].   Final next level of care: [place] Barriers to Discharge: Barriers Resolved   Patient Goals and CMS Choice Patient states their goals for this hospitalization and ongoing recovery are:: to be able to bring her home and arrange more help there CMS Medicare.gov Compare Post Acute Care list provided to:: Patient Represenative (must comment)(pt daughter) Choice offered to / list presented to : Adult Children  Discharge Placement Name of family member notified: pt daughter Jackelyn Poling Patient and family notified of of transfer: 03/09/19  Discharge Plan and Services In-house Referral: Clinical Social Work Discharge Planning Services: CM Consult Post Acute Care Choice: [place]          HH Arranged: PT Date [place]: 03/09/19  Readmission Risk Interventions Readmission Risk Prevention Plan 03/08/2019  Post Dischage Appt Not Complete  Appt Comments await discharge timing  Medication Screening Complete  Transportation Screening Complete  Some recent data might be hidden

## 2019-03-09 NOTE — Progress Notes (Signed)
Patient refused blood sugar accu check

## 2019-03-09 NOTE — Progress Notes (Signed)
Patient seen at the bedside after being paged that she was walking up and down the hallways.  The patient was calmly laying down in bed when I entered the room. Lights and TV were on.  The patient stated that she is naturally a night owl, and does not feel tired. She does not want any sleep medication at this time. She is amendable to turning off the TV and lights and trying to get some rest.  Given her underlying dementia, I would prefer to avoid sedating medications as this may worsen her confusion. Will continue to monitor.  Earlene Plater, MD Internal Medicine, PGY1 Pager: [phone]  03/09/2019,12:21 AM

## 2019-03-09 NOTE — TOC Progression Note (Signed)
Transition of Care [facility]) - Progression Note    Patient Details  Name: [name] MRN: [id number] Date of Birth: 04/22/1935  Transition of Care [facility]) CM/SW [place], [place] Phone Number: 03/09/2019, 11:21 AM  Clinical Narrative:    CSW called and spoke with pt daughter Jackelyn Poling.  Per Jackelyn Poling pt husband did make it back home last night but since he stayed until 8pm he did not make it home until 1am so they as a family have not had a chance to look through the information regarding home health agency choice.   Pt daughter does have CSW number and name and will f/u. This Probation officer will not be here for the holiday tomorrow, pt daughter aware, will be back Friday and f/u with family again. Pt with improved mobility today but still requiring supervision- pt daughter aware.    Expected Discharge Plan: [place] Barriers to Discharge: Continued Medical Work up  Expected Discharge Plan and Services Expected Discharge Plan: [place] In-house Referral: Clinical Social Work Discharge Planning Services: CM Consult Post Acute Care Choice: [place] Living arrangements for the past 2 months: Single Family Home HH Arranged: PT, Nurse's Aide  Social Determinants of Health (SDOH) Interventions    Readmission Risk Interventions Readmission Risk Prevention Plan 03/08/2019  Post Dischage Appt Not Complete  Appt Comments await discharge timing  Medication Screening Complete  Transportation Screening Complete  Some recent data might be hidden

## 2019-03-11 LAB — ACTH: C206 ACTH: 13.1 pg/mL (ref 7.2–63.3)

## 2019-03-12 LAB — CULTURE, BLOOD (ROUTINE X 2)
Culture: NO GROWTH
Culture: NO GROWTH
Special Requests: ADEQUATE
Special Requests: ADEQUATE

## 2019-03-15 DIAGNOSIS — G9341 Metabolic encephalopathy: Secondary | ICD-10-CM | POA: Diagnosis not present

## 2019-03-15 DIAGNOSIS — I252 Old myocardial infarction: Secondary | ICD-10-CM | POA: Diagnosis not present

## 2019-03-15 DIAGNOSIS — I2510 Atherosclerotic heart disease of native coronary artery without angina pectoris: Secondary | ICD-10-CM | POA: Diagnosis not present

## 2019-03-15 DIAGNOSIS — I5032 Chronic diastolic (congestive) heart failure: Secondary | ICD-10-CM | POA: Diagnosis not present

## 2019-03-15 DIAGNOSIS — F0390 Unspecified dementia without behavioral disturbance: Secondary | ICD-10-CM | POA: Diagnosis not present

## 2019-03-15 DIAGNOSIS — I110 Hypertensive heart disease with heart failure: Secondary | ICD-10-CM | POA: Diagnosis not present

## 2019-03-15 DIAGNOSIS — E1165 Type 2 diabetes mellitus with hyperglycemia: Secondary | ICD-10-CM | POA: Diagnosis not present

## 2019-03-28 DIAGNOSIS — E039 Hypothyroidism, unspecified: Secondary | ICD-10-CM | POA: Diagnosis not present

## 2019-03-28 DIAGNOSIS — E1165 Type 2 diabetes mellitus with hyperglycemia: Secondary | ICD-10-CM | POA: Diagnosis not present

## 2019-03-28 DIAGNOSIS — I10 Essential (primary) hypertension: Secondary | ICD-10-CM | POA: Diagnosis not present

## 2019-05-04 ENCOUNTER — Ambulatory Visit: Payer: Self-pay | Attending: Internal Medicine

## 2019-05-04 DIAGNOSIS — Z23 Encounter for immunization: Secondary | ICD-10-CM | POA: Insufficient documentation

## 2019-05-04 NOTE — Progress Notes (Signed)
   Covid-19 Vaccination Clinic  Name:  Alyssa [name]    MRN: [id number] DOB: 07-14-1935  05/04/2019  [name] was observed post Covid-19 immunization for 15 minutes without incidence. She was provided with Vaccine Information Sheet and instruction to access the V-Safe system.   [name] was instructed to call 911 with any severe reactions post vaccine: [name] Difficulty breathing  . Swelling of your face and throat  . A fast heartbeat  . A bad rash all over your body  . Dizziness and weakness    Immunizations Administered    Name Date Dose VIS Date Route   Pfizer COVID-19 Vaccine 05/04/2019  4:07 PM 0.3 mL 03/25/2019 Intramuscular   Manufacturer: [place]   Lot: [id number]   [place]: [id number]

## 2019-05-22 ENCOUNTER — Ambulatory Visit: Payer: Self-pay | Attending: Internal Medicine

## 2019-05-22 DIAGNOSIS — Z23 Encounter for immunization: Secondary | ICD-10-CM | POA: Insufficient documentation

## 2019-05-22 NOTE — Progress Notes (Signed)
   Covid-19 Vaccination Clinic  Name:  Alyssa [name]    MRN: [id number] DOB: 1935-11-21  05/22/2019  [name] was observed post Covid-19 immunization for 15 minutes without incidence. She was provided with Vaccine Information Sheet and instruction to access the V-Safe system.   [name] was instructed to call 911 with any severe reactions post vaccine: [name] Difficulty breathing  . Swelling of your face and throat  . A fast heartbeat  . A bad rash all over your body  . Dizziness and weakness    Immunizations Administered    Name Date Dose VIS Date Route   Pfizer COVID-19 Vaccine 05/22/2019  1:56 PM 0.3 mL 03/25/2019 Intramuscular   Manufacturer: [place]   Lot: [id number]   [place]: [id number]

## 2019-07-13 LAB — ALDOSTERONE + RENIN ACTIVITY W/ RATIO
ALDO / PRA Ratio: 9.5 (ref 0.0–30.0)
Aldosterone: 3 ng/dL (ref 0.0–30.0)
PRA LC/MS/MS: 0.315 ng/mL/hr (ref 0.167–5.380)

## 2019-08-02 DIAGNOSIS — S51019A Laceration without foreign body of unspecified elbow, initial encounter: Secondary | ICD-10-CM | POA: Diagnosis not present

## 2019-08-02 DIAGNOSIS — Z1331 Encounter for screening for depression: Secondary | ICD-10-CM | POA: Diagnosis not present

## 2019-08-02 DIAGNOSIS — E1165 Type 2 diabetes mellitus with hyperglycemia: Secondary | ICD-10-CM | POA: Diagnosis not present

## 2019-08-02 DIAGNOSIS — I10 Essential (primary) hypertension: Secondary | ICD-10-CM | POA: Diagnosis not present

## 2019-08-02 DIAGNOSIS — F0390 Unspecified dementia without behavioral disturbance: Secondary | ICD-10-CM | POA: Diagnosis not present

## 2019-08-02 DIAGNOSIS — E039 Hypothyroidism, unspecified: Secondary | ICD-10-CM | POA: Diagnosis not present

## 2019-08-17 DIAGNOSIS — H2513 Age-related nuclear cataract, bilateral: Secondary | ICD-10-CM | POA: Diagnosis not present

## 2019-08-17 DIAGNOSIS — Z794 Long term (current) use of insulin: Secondary | ICD-10-CM | POA: Diagnosis not present

## 2019-08-17 DIAGNOSIS — H53462 Homonymous bilateral field defects, left side: Secondary | ICD-10-CM | POA: Diagnosis not present

## 2019-08-17 DIAGNOSIS — H43813 Vitreous degeneration, bilateral: Secondary | ICD-10-CM | POA: Diagnosis not present

## 2019-08-17 DIAGNOSIS — E119 Type 2 diabetes mellitus without complications: Secondary | ICD-10-CM | POA: Diagnosis not present

## 2019-08-26 ENCOUNTER — Emergency Department (HOSPITAL_COMMUNITY): Payer: PPO

## 2019-08-26 ENCOUNTER — Encounter (HOSPITAL_COMMUNITY): Payer: Self-pay

## 2019-08-26 ENCOUNTER — Other Ambulatory Visit: Payer: Self-pay

## 2019-08-26 ENCOUNTER — Emergency Department (HOSPITAL_COMMUNITY)
Admission: EM | Admit: 2019-08-26 | Discharge: 2019-08-26 | Disposition: A | Discharge status: Home / Self Care | Payer: PPO | Attending: Emergency Medicine | Admitting: Emergency Medicine

## 2019-08-26 DIAGNOSIS — I110 Hypertensive heart disease with heart failure: Secondary | ICD-10-CM | POA: Insufficient documentation

## 2019-08-26 DIAGNOSIS — R079 Chest pain, unspecified: Secondary | ICD-10-CM

## 2019-08-26 DIAGNOSIS — I2510 Atherosclerotic heart disease of native coronary artery without angina pectoris: Secondary | ICD-10-CM | POA: Diagnosis not present

## 2019-08-26 DIAGNOSIS — R0789 Other chest pain: Secondary | ICD-10-CM | POA: Diagnosis not present

## 2019-08-26 DIAGNOSIS — R9431 Abnormal electrocardiogram [ECG] [EKG]: Secondary | ICD-10-CM | POA: Diagnosis not present

## 2019-08-26 DIAGNOSIS — I5032 Chronic diastolic (congestive) heart failure: Secondary | ICD-10-CM | POA: Insufficient documentation

## 2019-08-26 DIAGNOSIS — Z87891 Personal history of nicotine dependence: Secondary | ICD-10-CM | POA: Diagnosis not present

## 2019-08-26 DIAGNOSIS — I700 Atherosclerosis of aorta: Secondary | ICD-10-CM | POA: Diagnosis not present

## 2019-08-26 DIAGNOSIS — K8020 Calculus of gallbladder without cholecystitis without obstruction: Secondary | ICD-10-CM | POA: Diagnosis not present

## 2019-08-26 LAB — CBC WITH DIFFERENTIAL/PLATELET
Abs Immature Granulocytes: 0.03 10*3/uL (ref 0.00–0.07)
Basophils Absolute: 0.1 10*3/uL (ref 0.0–0.1)
Basophils Relative: 1 %
Eosinophils Absolute: 0.2 10*3/uL (ref 0.0–0.5)
Eosinophils Relative: 3 %
HCT: 40.4 % (ref 36.0–46.0)
Hemoglobin: 12 g/dL (ref 12.0–15.0)
Immature Granulocytes: 0 %
Lymphocytes Relative: 39 %
Lymphs Abs: 2.8 10*3/uL (ref 0.7–4.0)
MCH: 26.8 pg (ref 26.0–34.0)
MCHC: 29.7 g/dL — ABNORMAL LOW (ref 30.0–36.0)
MCV: 90.2 fL (ref 80.0–100.0)
Monocytes Absolute: 0.7 10*3/uL (ref 0.1–1.0)
Monocytes Relative: 10 %
Neutro Abs: 3.3 10*3/uL (ref 1.7–7.7)
Neutrophils Relative %: 47 %
Platelets: 234 10*3/uL (ref 150–400)
RBC: 4.48 MIL/uL (ref 3.87–5.11)
RDW: 14.7 % (ref 11.5–15.5)
WBC: 7.1 10*3/uL (ref 4.0–10.5)
nRBC: 0 % (ref 0.0–0.2)

## 2019-08-26 LAB — COMPREHENSIVE METABOLIC PANEL
ALT: 30 U/L (ref 0–44)
AST: 30 U/L (ref 15–41)
Albumin: 3.4 g/dL — ABNORMAL LOW (ref 3.5–5.0)
Alkaline Phosphatase: 76 U/L (ref 38–126)
Anion gap: 8 (ref 5–15)
BUN: 11 mg/dL (ref 8–23)
CO2: 24 mmol/L (ref 22–32)
Calcium: 8.6 mg/dL — ABNORMAL LOW (ref 8.9–10.3)
Chloride: 107 mmol/L (ref 98–111)
Creatinine, Ser: 0.85 mg/dL (ref 0.44–1.00)
GFR calc Af Amer: >60 mL/min (ref >60)
GFR calc non Af Amer: >60 mL/min (ref >60)
Glucose, Bld: 147 mg/dL — ABNORMAL HIGH (ref 70–99)
Potassium: 3.7 mmol/L (ref 3.5–5.1)
Sodium: 139 mmol/L (ref 135–145)
Total Bilirubin: 0.7 mg/dL (ref 0.3–1.2)
Total Protein: 6.1 g/dL — ABNORMAL LOW (ref 6.5–8.1)

## 2019-08-26 LAB — TROPONIN I (HIGH SENSITIVITY)
Troponin I (High Sensitivity): 8 ng/L (ref <18)
Troponin I (High Sensitivity): 7 ng/L (ref <18)

## 2019-08-26 LAB — LIPASE, BLOOD: Lipase: 68 U/L — ABNORMAL HIGH (ref 11–51)

## 2019-08-26 MED ORDER — IOHEXOL 350 MG/ML SOLN
100.0000 mL | Freq: Once | INTRAVENOUS | Status: AC | PRN
  Administered 2019-08-26: 100 mL via INTRAVENOUS

## 2019-08-26 NOTE — Discharge Instructions (Signed)
Please return if you have return or worsening of your chest pain Follow-up with your doctor regarding the abnormalities on your CT scan, specifically renal artery stenosis.

## 2019-08-26 NOTE — ED Notes (Signed)
Pt transported CT

## 2019-08-26 NOTE — ED Provider Notes (Addendum)
  Physical Exam  BP (!) 178/49   Pulse (!) 58   Temp 97.9 F (36.6 C) (Oral)   Resp 12   Ht 1.575 m (5\' 2" )   Wt 65.8 kg   SpO2 98%   BMI 26.52 kg/m   Physical Exam  ED Course/Procedures     Procedures  MDM  Received sign out from Dr. Dayna Barker.  Patient with intermittent chest pain for 2 weeks.  Pain radiates through to back from epigastrium and anterior chest.  Patient without acutely ischemic ekg.  Trop and delta trop and dissection study pending. Patient sitting in bed.  States she feels bad, but unable to give specifics.  Troponins normal. Awaiting cta results and will d/c if no acute abnormality Reviewed CT dissection study.  Noted no evidence of acute aortic syndrome, aortic atherosclerosis and stenting, 70% out from a modest narrowing of the proximal left renal artery, cirrhotic liver, and cholelithiasis.  Discussed above with patient and need for follow-up with her doctor and she voices understanding.  She is pain-free.  She does not appear to have acute coronary syndrome with normal troponins.  CT does not show any dissection or evidence of pulmonary embolism.  Patient had presented for upper chest pain.  This has resolved.  We discussed the need for her to follow-up with her cardiologist and primary care doctor she voiced understanding.   Vitals:   08/26/19 0645 08/26/19 1021  BP: (!) 178/49 108/68  Pulse: (!) 58 (!) 57  Resp: 12 17  Temp:    SpO2: 98% 100%      Pattricia Boss, MD 08/26/19 1220    Pattricia Boss, MD 08/26/19 1221

## 2019-08-26 NOTE — ED Provider Notes (Signed)
Emergency Department Provider Note  I have reviewed the triage vital signs and the nursing notes.  HISTORY  Chief Complaint Chest Pain   HPI Alyssa [name] is a 84 y.o. female who presents to the emergency department today with severe chest pain. Patient states he has had off and on again sharp epigastric chest pain that radiates up her chest and into her back for the last week is intermittent in nature. Not really associated with any specific movements or other associated symptoms. No shortness of breath, nausea or vomiting. No lightheadedness or diaphoresis. She states she has history of a couple heart attacks and this feels nothing like that. No paresthesias or other neurologic symptoms.   No other associated or modifying symptoms.    Past Medical History:  Diagnosis Date  . Anemia   . CAD (coronary artery disease)    a. 2013 NSTEMI s/p DES to LAD. b. 04/03/14: NSTEMI s/p DES to OM1, DES to dRCA.  [name] Chronic diastolic CHF (congestive heart failure) ([place])    a. 2D ECHO 04/02/14 with hypokinesis in the basal inferior and inferoseptal walls. Focal and moderate concentric LVH. Overall LVEF 60-65%. G1DD. Elevated EDLV filling pressures. Mild TR.  . Diverticulosis   . GERD (gastroesophageal reflux disease)   . History of GI bleed    a. 2013: adm for ABL anemia. 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  [name] HLD (hyperlipidemia)   . HTN (hypertension)   . Hypothyroid   . Memory disorder 10/06/2017  . Peripheral vascular disease ([place])   . Refusal of blood transfusions as patient is Jehovah's Witness 09-29-12  . Serrated polyp of colon 2013  . Type II diabetes mellitus [facility])     Patient Active Problem List   Diagnosis Date Noted  . Adrenal insufficiency ([place]) 03/09/2019  . Hypothermia   . Acute metabolic encephalopathy due to hypoglycemia   . Hypoglycemia 03/07/2019  . Memory disorder  10/06/2017  . Insulin dependent diabetes mellitus 11/19/2016  . Chest pain 11/18/2016  . Diabetes mellitus without complication ([place]) [id number]  . Dysthymia 06/20/2014  . Hypertensive heart disease 04/11/2014  . Anemia   . History of GI bleed   . CAD (coronary artery disease)   . HLD (hyperlipidemia)   . Peripheral vascular disease ([place])   . Chronic diastolic CHF (congestive heart failure) ([place])   . Essential hypertension   . NSTEMI (non-ST elevated myocardial infarction) ([place]) 04/01/2014  . Refusal of blood transfusions as patient is Jehovah's Witness 09/29/2012  . Blood loss anemia 02/26/2012  . Melena 02/25/2012  . GERD (gastroesophageal reflux disease) 11/11/2011  . DM type 2 (diabetes mellitus, type 2) ([place]) 05/26/2011  . Hypothyroid     Past Surgical History:  Procedure Laterality Date  . CESAREAN SECTION  ~ 1961; 1966   plus 3 NVD  . COLONOSCOPY  02/27/2012   Procedure: COLONOSCOPY;  Surgeon: Lear Ng, MD;  Location: [facility] ENDOSCOPY;  Service: Endoscopy;  Laterality: N/A;  . CORONARY ANGIOPLASTY WITH STENT PLACEMENT  05/2011; 04/03/2014  . ESOPHAGOGASTRODUODENOSCOPY  02/26/2012   Procedure: ESOPHAGOGASTRODUODENOSCOPY (EGD);  Surgeon: Lear Ng, MD;  Location: [facility] ENDOSCOPY;  Service: Endoscopy;  Laterality: N/A;  . LEFT HEART CATHETERIZATION WITH CORONARY ANGIOGRAM N/A 06/12/2011   Procedure: LEFT HEART CATHETERIZATION WITH CORONARY ANGIOGRAM;  Surgeon: Josue Hector, MD;  Location: [facility] CATH LAB;  Service: Cardiovascular;  Laterality: N/A;  . LEFT HEART CATHETERIZATION  WITH CORONARY ANGIOGRAM N/A 04/03/2014   Procedure: LEFT HEART CATHETERIZATION WITH CORONARY ANGIOGRAM;  Surgeon: Jettie Booze, MD;  Location: [facility] CATH LAB;  Service: Cardiovascular;  Laterality: N/A;  . TONSILLECTOMY    . VAGINAL HYSTERECTOMY      Current Outpatient Rx  . Order #: [id number] Class: Historical Med  . Order #: [id number] Class: Normal  . Order #: [id number] Class:  Historical Med  . Order #: [id number] Class: Normal  . Order #: [id number] Class: Normal  . Order #: [id number] Class: Historical Med  . Order #: [id number] Class: Normal  . Order #: [id number] Class: Normal  . Order #: [id number] Class: Historical Med  . Order #: [id number] Class: Historical Med  . Order #: [id number] Class: Normal  . Order #: [id number] Class: Historical Med  . Order #: [id number] Class: Historical Med  . Order #: [id number] Class: Historical Med  . Order #: [id number] Class: Historical Med  . Order #: [id number] Class: Historical Med  . Order #: [id number] Class: Historical Med  . Order #: [id number] Class: Normal    Allergies Tetanus toxoid, adsorbed  Family History  Problem Relation Age of Onset  . Arthritis Father   . Heart disease Mother   . Varicose Veins Mother   . Hypertension Other     Social History Social History   Tobacco Use  . Smoking status: Former Smoker    Packs/day: 1.50    Years: 16.00    Pack years: 24.00    Types: Cigarettes    Quit date: 03/15/1970    Years since quitting: 49.4  . Smokeless tobacco: Never Used  Substance Use Topics  . Alcohol use: Yes    Alcohol/week: 3.0 - 4.0 standard drinks    Types: 3 - 4 Standard drinks or equivalent per week    Comment: 04/03/2014 "glass of wine or a beer 2-3 times/month"  . Drug use: No    Review of Systems  All other systems negative except as documented in the HPI. All pertinent positives and negatives as reviewed in the HPI. ____________________________________________  PHYSICAL EXAM:  VITAL SIGNS: ED Triage Vitals  Enc Vitals Group     BP 08/26/19 0528 (!) 157/64     Pulse Rate 08/26/19 0528 68     Resp 08/26/19 0528 14     Temp 08/26/19 0528 97.9 F (36.6 C)     Temp Source 08/26/19 0528 Oral     SpO2 08/26/19 0525 97 %     Weight 08/26/19 0533 145 lb (65.8 kg)     Height 08/26/19 0533 5\' 2"  (1.575 m)     Head Circumference --      Peak Flow --      Pain Score --      Pain Loc --      Pain  Edu? --      Excl. in [place]? --     Constitutional: Alert and oriented. Well appearing and in no acute distress. Eyes: Conjunctivae are normal. PERRL. EOMI. Head: Atraumatic. Nose: No congestion/rhinnorhea. Mouth/Throat: Mucous membranes are moist.  Oropharynx non-erythematous. Neck: No stridor.  No meningeal signs.   Cardiovascular: Normal rate, regular rhythm. Good peripheral circulation. Grossly normal heart sounds.   Respiratory: Normal respiratory effort.  No retractions. Lungs CTAB. Gastrointestinal: Soft and nontender. No distention.  Musculoskeletal: No lower extremity tenderness nor edema. No gross deformities of extremities. Neurologic:  Normal speech and language. No gross focal neurologic deficits are appreciated.  Skin:  Skin is warm, dry and intact. No rash noted.  ____________________________________________   LABS (all labs ordered are listed, but  only abnormal results are displayed)  Labs Reviewed  CBC WITH DIFFERENTIAL/PLATELET  COMPREHENSIVE METABOLIC PANEL  LIPASE, BLOOD  TROPONIN I (HIGH SENSITIVITY)   ____________________________________________  EKG   EKG Interpretation  Date/Time:    Ventricular Rate:    PR Interval:    QRS Duration:   QT Interval:    QTC Calculation:   R Axis:     Text Interpretation:         ____________________________________________  RADIOLOGY  DG Chest Portable 1 View  Result Date: 08/26/2019 CLINICAL DATA:  Chest pain. EXAM: PORTABLE CHEST 1 VIEW COMPARISON:  Chest x-ray dated March 07, 2019. FINDINGS: Unchanged mild cardiomegaly. Normal pulmonary vascularity. No focal consolidation, pleural effusion, or pneumothorax. No acute osseous abnormality. IMPRESSION: No active disease. Electronically Signed   By: Titus Dubin M.D.   On: 08/26/2019 06:57   ____________________________________________  PROCEDURES  Procedure(s) performed:   Procedures ____________________________________________  INITIAL IMPRESSION  / ASSESSMENT AND PLAN / ED COURSE   This patient presents to the ED for concern of chest pain, this involves an extensive number of treatment options, and is a complaint that carries with it a high risk of complications and morbidity.  The differential diagnosis includes ACS, pulmonary embolus, pneumonia, dissection, pneumothorax  Will check labs, cta and dispo as appropriate. Care transferred pending the same.   ____________________________________________  FINAL CLINICAL IMPRESSION(S) / ED DIAGNOSES  Final diagnoses:  None    MEDICATIONS GIVEN DURING THIS VISIT:  Medications - No data to display  NEW OUTPATIENT MEDICATIONS STARTED DURING THIS VISIT:  New Prescriptions   No medications on file    Note:  This note was prepared with assistance of Dragon voice recognition software. Occasional wrong-word or sound-a-like substitutions may have occurred due to the inherent limitations of voice recognition software.   Palmyra Rogacki, Corene Cornea, MD 08/26/19 [id number]

## 2019-08-26 NOTE — ED Triage Notes (Signed)
Arrives via EMS for chest pain that started approx a week ago but woke her up out of bed tonight. Rated 10/10, now 1/10. Reports MI hx a few years ago. 324 ASA given en route. 20G R ac. AOx4. GCS15.    BP: 130/82, HR 62, 98%RA

## 2019-08-31 DIAGNOSIS — Z8673 Personal history of transient ischemic attack (TIA), and cerebral infarction without residual deficits: Secondary | ICD-10-CM | POA: Diagnosis not present

## 2019-08-31 DIAGNOSIS — H5340 Unspecified visual field defects: Secondary | ICD-10-CM | POA: Diagnosis not present

## 2019-09-14 DIAGNOSIS — E785 Hyperlipidemia, unspecified: Secondary | ICD-10-CM | POA: Diagnosis not present

## 2019-09-14 DIAGNOSIS — E1165 Type 2 diabetes mellitus with hyperglycemia: Secondary | ICD-10-CM | POA: Diagnosis not present

## 2019-09-14 DIAGNOSIS — I739 Peripheral vascular disease, unspecified: Secondary | ICD-10-CM | POA: Diagnosis not present

## 2019-09-14 DIAGNOSIS — Z79899 Other long term (current) drug therapy: Secondary | ICD-10-CM | POA: Diagnosis not present

## 2019-09-14 DIAGNOSIS — Z7189 Other specified counseling: Secondary | ICD-10-CM | POA: Diagnosis not present

## 2019-09-14 DIAGNOSIS — I10 Essential (primary) hypertension: Secondary | ICD-10-CM | POA: Diagnosis not present

## 2019-09-14 DIAGNOSIS — E039 Hypothyroidism, unspecified: Secondary | ICD-10-CM | POA: Diagnosis not present

## 2019-09-14 DIAGNOSIS — F0390 Unspecified dementia without behavioral disturbance: Secondary | ICD-10-CM | POA: Diagnosis not present

## 2019-09-14 DIAGNOSIS — F325 Major depressive disorder, single episode, in full remission: Secondary | ICD-10-CM | POA: Diagnosis not present

## 2019-10-27 DIAGNOSIS — H2511 Age-related nuclear cataract, right eye: Secondary | ICD-10-CM | POA: Diagnosis not present

## 2019-11-13 DIAGNOSIS — H2512 Age-related nuclear cataract, left eye: Secondary | ICD-10-CM | POA: Diagnosis not present

## 2019-11-16 DIAGNOSIS — R159 Full incontinence of feces: Secondary | ICD-10-CM | POA: Diagnosis not present

## 2019-11-16 DIAGNOSIS — Z794 Long term (current) use of insulin: Secondary | ICD-10-CM | POA: Diagnosis not present

## 2019-11-16 DIAGNOSIS — I252 Old myocardial infarction: Secondary | ICD-10-CM | POA: Diagnosis not present

## 2019-11-16 DIAGNOSIS — Z87891 Personal history of nicotine dependence: Secondary | ICD-10-CM | POA: Diagnosis not present

## 2019-11-16 DIAGNOSIS — R079 Chest pain, unspecified: Secondary | ICD-10-CM | POA: Diagnosis not present

## 2019-11-16 DIAGNOSIS — E161 Other hypoglycemia: Secondary | ICD-10-CM | POA: Diagnosis not present

## 2019-11-16 DIAGNOSIS — Z79890 Hormone replacement therapy (postmenopausal): Secondary | ICD-10-CM | POA: Diagnosis not present

## 2019-11-16 DIAGNOSIS — I110 Hypertensive heart disease with heart failure: Secondary | ICD-10-CM | POA: Diagnosis not present

## 2019-11-16 DIAGNOSIS — R197 Diarrhea, unspecified: Secondary | ICD-10-CM | POA: Diagnosis not present

## 2019-11-16 DIAGNOSIS — Z955 Presence of coronary angioplasty implant and graft: Secondary | ICD-10-CM | POA: Diagnosis not present

## 2019-11-16 DIAGNOSIS — R0789 Other chest pain: Secondary | ICD-10-CM | POA: Diagnosis not present

## 2019-11-16 DIAGNOSIS — R001 Bradycardia, unspecified: Secondary | ICD-10-CM | POA: Diagnosis not present

## 2019-11-16 DIAGNOSIS — I10 Essential (primary) hypertension: Secondary | ICD-10-CM | POA: Diagnosis not present

## 2019-11-16 DIAGNOSIS — Z79899 Other long term (current) drug therapy: Secondary | ICD-10-CM | POA: Diagnosis not present

## 2019-11-16 DIAGNOSIS — R231 Pallor: Secondary | ICD-10-CM | POA: Diagnosis not present

## 2019-11-16 DIAGNOSIS — E162 Hypoglycemia, unspecified: Secondary | ICD-10-CM | POA: Diagnosis not present

## 2019-11-16 DIAGNOSIS — Z7902 Long term (current) use of antithrombotics/antiplatelets: Secondary | ICD-10-CM | POA: Diagnosis not present

## 2019-11-16 DIAGNOSIS — E039 Hypothyroidism, unspecified: Secondary | ICD-10-CM | POA: Diagnosis not present

## 2019-11-16 DIAGNOSIS — E11649 Type 2 diabetes mellitus with hypoglycemia without coma: Secondary | ICD-10-CM | POA: Diagnosis not present

## 2019-11-16 DIAGNOSIS — Z7982 Long term (current) use of aspirin: Secondary | ICD-10-CM | POA: Diagnosis not present

## 2019-11-16 DIAGNOSIS — Z20822 Contact with and (suspected) exposure to covid-19: Secondary | ICD-10-CM | POA: Diagnosis not present

## 2019-11-16 DIAGNOSIS — I509 Heart failure, unspecified: Secondary | ICD-10-CM | POA: Diagnosis not present

## 2019-11-17 DIAGNOSIS — H2512 Age-related nuclear cataract, left eye: Secondary | ICD-10-CM | POA: Diagnosis not present

## 2019-12-27 ENCOUNTER — Encounter (HOSPITAL_COMMUNITY): Payer: Self-pay

## 2019-12-27 ENCOUNTER — Other Ambulatory Visit: Payer: Self-pay

## 2019-12-27 ENCOUNTER — Ambulatory Visit (HOSPITAL_COMMUNITY)
Admission: EM | Admit: 2019-12-27 | Discharge: 2019-12-27 | Disposition: A | Discharge status: Home / Self Care | Payer: PPO | Attending: Emergency Medicine | Admitting: Emergency Medicine

## 2019-12-27 DIAGNOSIS — L03116 Cellulitis of left lower limb: Secondary | ICD-10-CM | POA: Diagnosis not present

## 2019-12-27 DIAGNOSIS — S60222A Contusion of left hand, initial encounter: Secondary | ICD-10-CM

## 2019-12-27 DIAGNOSIS — S61412A Laceration without foreign body of left hand, initial encounter: Secondary | ICD-10-CM

## 2019-12-27 MED ORDER — DOXYCYCLINE HYCLATE 100 MG PO CAPS
100.0000 mg | ORAL_CAPSULE | Freq: Two times a day (BID) | ORAL | 0 refills | Status: AC
Start: 2019-12-27 — End: 2020-01-06

## 2019-12-27 NOTE — ED Triage Notes (Signed)
Per family member, pt is having left foot swelling x 3 days. Family member states she noticed today the pt has redness in the left leg and left hand laceration.

## 2019-12-27 NOTE — Discharge Instructions (Signed)
Begin doxycycline twice daily with food for 10 days Keep wounds clean and dry Elevate legs Ice hand Follow up for any concerns

## 2019-12-28 NOTE — ED Provider Notes (Signed)
[place]    CSN: [id number] Arrival date & time: 12/27/19  1535      History   Chief Complaint Chief Complaint  Patient presents with   Laceration   Foot Swelling    HPI Alyssa [name] is a 84 y.o. female history of CAD, CHF, hypertension, hyperlipidemia, DM type II, presenting today for evaluation of left leg swelling and pain as well as laceration to left hand.  Patient began to develop redness swelling and a wound to her lower left leg.  Associated pain.  Denies associated fevers.  No known injury.  Does report history of some circulation issues.  Also laceration to left hand.  Believes to be sustained while gardening, not believed from any fall.  She reports being able to move her fingers normally.  Some surrounding soreness and swelling.  HPI  Past Medical History:  Diagnosis Date   Anemia    CAD (coronary artery disease)    a. 2013 NSTEMI s/p DES to LAD. b. 04/03/14: NSTEMI s/p DES to OM1, DES to dRCA.   Chronic diastolic CHF (congestive heart failure) ([place])    a. 2D ECHO 04/02/14 with hypokinesis in the basal inferior and inferoseptal walls. Focal and moderate concentric LVH. Overall LVEF 60-65%. G1DD. Elevated EDLV filling pressures. Mild TR.   Diverticulosis    GERD (gastroesophageal reflux disease)    History of GI bleed    a. 2013: adm for ABL anemia. 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   HLD (hyperlipidemia)    HTN (hypertension)    Hypothyroid    Memory disorder 10/06/2017   Peripheral vascular disease ([place])    Refusal of blood transfusions as patient is Jehovah's Witness 09-29-12   Serrated polyp of colon 2013   Type II diabetes mellitus ([place])     Patient Active Problem List   Diagnosis Date Noted   Adrenal insufficiency ([place]) 03/09/2019   Hypothermia    Acute metabolic encephalopathy due to hypoglycemia    Hypoglycemia  03/07/2019   Memory disorder 10/06/2017   Insulin dependent diabetes mellitus 11/19/2016   Chest pain 11/18/2016   Diabetes mellitus without complication ([place]) 92/42/6834   Dysthymia 06/20/2014   Hypertensive heart disease 04/11/2014   Anemia    History of GI bleed    CAD (coronary artery disease)    HLD (hyperlipidemia)    Peripheral vascular disease ([facility])    Chronic diastolic CHF (congestive heart failure) ([place])    Essential hypertension    NSTEMI (non-ST elevated myocardial infarction) ([place]) 04/01/2014   Refusal of blood transfusions as patient is Jehovah's Witness 09/29/2012   Blood loss anemia 02/26/2012   Melena 02/25/2012   GERD (gastroesophageal reflux disease) 11/11/2011   DM type 2 (diabetes mellitus, type 2) ([place]) 05/26/2011   Hypothyroid     Past Surgical History:  Procedure Laterality Date   CESAREAN SECTION  ~ 1961; 1966   plus 3 NVD   COLONOSCOPY  02/27/2012   Procedure: COLONOSCOPY;  Surgeon: Lear Ng, MD;  Location: [place];  Service: Endoscopy;  Laterality: N/A;   CORONARY ANGIOPLASTY WITH STENT PLACEMENT  05/2011; 04/03/2014   ESOPHAGOGASTRODUODENOSCOPY  02/26/2012   Procedure: ESOPHAGOGASTRODUODENOSCOPY (EGD);  Surgeon: Lear Ng, MD;  Location: [facility] ENDOSCOPY;  Service: Endoscopy;  Laterality: N/A;   LEFT HEART CATHETERIZATION WITH CORONARY ANGIOGRAM N/A 06/12/2011   Procedure: LEFT HEART CATHETERIZATION WITH CORONARY ANGIOGRAM;  Surgeon: Josue Hector, MD;  Location: [place] CATH LAB;  Service: Cardiovascular;  Laterality: N/A;   LEFT HEART CATHETERIZATION WITH CORONARY ANGIOGRAM N/A 04/03/2014   Procedure: LEFT HEART CATHETERIZATION WITH CORONARY ANGIOGRAM;  Surgeon: Jettie Booze, MD;  Location: [facility] CATH LAB;  Service: Cardiovascular;  Laterality: N/A;   TONSILLECTOMY     VAGINAL HYSTERECTOMY      OB History   No obstetric history on file.      Home Medications    Prior to Admission medications    Medication Sig Start Date End Date Taking? Authorizing Provider  atorvastatin (LIPITOR) 80 MG tablet TAKE 1 TABLET BY MOUTH ONCE DAILY. Patient taking differently: Take 80 mg by mouth every evening.  12/22/17   Sueanne Margarita, MD  carvedilol (COREG) 3.125 MG tablet TAKE 1 TABLET BY MOUTH TWICE DAILY Patient taking differently: Take 3.125 mg by mouth every evening.  11/17/17   Sueanne Margarita, MD  clopidogrel (PLAVIX) 75 MG tablet TAKE 1 TABLET BY MOUTH ONCE DAILY. Patient taking differently: Take 75 mg by mouth every evening.  11/17/17   Sueanne Margarita, MD  donepezil (ARICEPT) 10 MG tablet Take 10 mg by mouth every evening. 08/17/19   [provider]  doxycycline (VIBRAMYCIN) 100 MG capsule Take 1 capsule (100 mg total) by mouth 2 (two) times daily for 10 days. 12/27/19 01/06/20  Myriam Brandhorst C, PA-C  escitalopram (LEXAPRO) 20 MG tablet Take 1 tablet (20 mg total) by mouth every evening. 03/09/19   Al Decant, MD  fluticasone [facility]) 50 MCG/ACT nasal spray Place 2 sprays into both nostrils daily. 08/17/19   [provider]  furosemide (LASIX) 20 MG tablet TAKE 1 TABLET BY MOUTH ONCE DAILY. Patient not taking: Reported on 03/07/2019 12/22/17   Sueanne Margarita, MD  hydrocortisone (CORTEF) 5 MG tablet Take 3 tablets by mouth every evening. 08/17/19   [provider]  ibuprofen (ADVIL,MOTRIN) 200 MG tablet Take 400 mg by mouth every 6 (six) hours as needed for mild pain.    [provider]  LEVEMIR FLEXTOUCH 100 UNIT/ML FlexPen Inject 20 Units into the skin every evening. 08/17/19   [provider]  levothyroxine (SYNTHROID) 100 MCG tablet Take 1 tablet (100 mcg total) by mouth daily at 6 (six) AM. Patient not taking: Reported on 08/26/2019 03/10/19   Al Decant, MD  levothyroxine (SYNTHROID) 125 MCG tablet Take 1 tablet by mouth every evening. 08/17/19   [provider]  losartan (COZAAR) 50 MG tablet Take 50 mg by mouth every evening.  11/08/18    [provider]  metFORMIN (GLUCOPHAGE-XR) 500 MG 24 hr tablet Take 1,000 mg by mouth every evening. 02/28/19   [provider]  naproxen sodium (ALEVE) 220 MG tablet Take 220 mg by mouth daily as needed (pain).     [provider]  nitroGLYCERIN (NITROSTAT) 0.4 MG SL tablet Place 0.4 mg under the tongue every 5 (five) minutes as needed for chest pain. Reported on 10/04/2015    [provider]  polyethylene glycol (MIRALAX / GLYCOLAX) packet Take 17 g by mouth daily as needed for mild constipation. Reported on 10/04/2015    [provider]  [place] X 5/16" 0.5 ML MISC  12/31/16   [provider]  ZETIA 10 MG tablet TAKE 1 TABLET BY MOUTH EVERY DAY Patient taking differently: Take 10 mg by mouth every evening.  03/05/15   Sueanne Margarita, MD    Family History Family History  Problem Relation Age of  Onset   Arthritis Father    Heart disease Mother    Varicose Veins Mother    Hypertension Other     Social History Social History   Tobacco Use   Smoking status: Former Smoker    Packs/day: 1.50    Years: 16.00    Pack years: 24.00    Types: Cigarettes    Quit date: 03/15/1970    Years since quitting: 49.8   Smokeless tobacco: Never Used  Vaping Use   Vaping Use: Never used  Substance Use Topics   Alcohol use: Yes    Alcohol/week: 3.0 - 4.0 standard drinks    Types: 3 - 4 Standard drinks or equivalent per week    Comment: 04/03/2014 "glass of wine or a beer 2-3 times/month"   Drug use: No     Allergies   Tetanus toxoid, adsorbed   Review of Systems Review of Systems  Constitutional: Negative for fatigue and fever.  Eyes: Negative for visual disturbance.  Respiratory: Negative for shortness of breath.   Cardiovascular: Negative for chest pain.  Gastrointestinal: Negative for abdominal pain, nausea and vomiting.  Musculoskeletal: Negative for arthralgias and joint swelling.  Skin: Positive for  color change and wound. Negative for rash.  Neurological: Negative for dizziness, weakness, light-headedness and headaches.     Physical Exam Triage Vital Signs ED Triage Vitals  Enc Vitals Group     BP 12/27/19 1628 (!) 150/80     Pulse Rate 12/27/19 1628 81     Resp 12/27/19 1628 16     Temp 12/27/19 1628 98.6 F (37 C)     Temp Source 12/27/19 1628 Oral     SpO2 12/27/19 1628 97 %     Weight --      Height --      Head Circumference --      Peak Flow --      Pain Score 12/27/19 1635 0     Pain Loc --      Pain Edu? --      Excl. in [place]? --    No data found.  Updated Vital Signs BP (!) 150/80 (BP Location: Left Arm)    Pulse 81    Temp 98.6 F (37 C) (Oral)    Resp 16    SpO2 97%   Visual Acuity Right Eye Distance:   Left Eye Distance:   Bilateral Distance:    Right Eye Near:   Left Eye Near:    Bilateral Near:     Physical Exam Vitals and nursing note reviewed.  Constitutional:      Appearance: She is well-developed.     Comments: No acute distress  HENT:     Head: Normocephalic and atraumatic.     Nose: Nose normal.  Eyes:     Conjunctiva/sclera: Conjunctivae normal.  Cardiovascular:     Rate and Rhythm: Normal rate.  Pulmonary:     Effort: Pulmonary effort is normal. No respiratory distress.  Abdominal:     General: There is no distension.  Musculoskeletal:        General: Normal range of motion.     Cervical back: Neck supple.     Comments: Left lower leg with 2.75 x 0.75 cm wound/ulcer noted to left anterior lower leg with surrounding erythema warmth tenderness and mild swelling  Full active range of motion of left ankle and knee  Full active range of motion of left hand, fingers and wrist, radial pulse 2+  Skin:  General: Skin is warm and dry.     Comments: Left dorsum of hand with superficial abrasion well scabbed noted to first metacarpal area with surrounding erythema and ecchymosis, tenderness to touch, wound appears dry and without  drainage  Neurological:     Mental Status: She is alert and oriented to person, place, and time.      UC Treatments / Results  Labs (all labs ordered are listed, but only abnormal results are displayed) Labs Reviewed - No data to display  EKG   Radiology No results found.  Procedures Procedures (including critical care time)  Medications Ordered in UC Medications - No data to display  Initial Impression / Assessment and Plan / UC Course  I have reviewed the triage vital signs and the nursing notes.  Pertinent labs & imaging results that were available during my care of the patient were reviewed by me and considered in my medical decision making (see chart for details).     1.  Cellulitis to left lower leg-treating for infection with doxycycline recommended wound care keeping clean and dry, elevating legs to help with circulation and swelling.  Keep close eye on wound, does not have history of ulcers, advised if wound worsening/enlarging instead of healing despite oral antibiotics to follow-up, may need referral to wound clinic.  2.  Laceration to hand-low concern for underlying fracture, deferring imaging.  Wound greater than 24 hours and appears scabbed and healing.  Recommending wound care icing and anti-inflammatories.  Discussed strict return precautions. Patient verbalized understanding and is agreeable with plan.  Final Clinical Impressions(s) / UC Diagnoses   Final diagnoses:  Cellulitis of left lower extremity  Contusion of left hand, initial encounter  Laceration of left hand without foreign body, initial encounter     Discharge Instructions     Begin doxycycline twice daily with food for 10 days Keep wounds clean and dry Elevate legs Ice hand Follow up for any concerns    ED Prescriptions    Medication Sig Dispense Auth. Provider   doxycycline (VIBRAMYCIN) 100 MG capsule Take 1 capsule (100 mg total) by mouth 2 (two) times daily for 10 days. 20  capsule Toriano Aikey, [place] C, PA-C     PDMP not reviewed this encounter.   Janith Lima, [place] 12/28/19 1717

## 2020-04-19 IMAGING — DX LEFT FOOT - COMPLETE 3+ VIEW
3 series · 3 of 3 positions shown · non-contrast
Comparison: None.

CLINICAL DATA: Fell today with foot pain.  Unable to bear weight.

EXAM:
LEFT FOOT - COMPLETE 3+ VIEW

[foot ap]
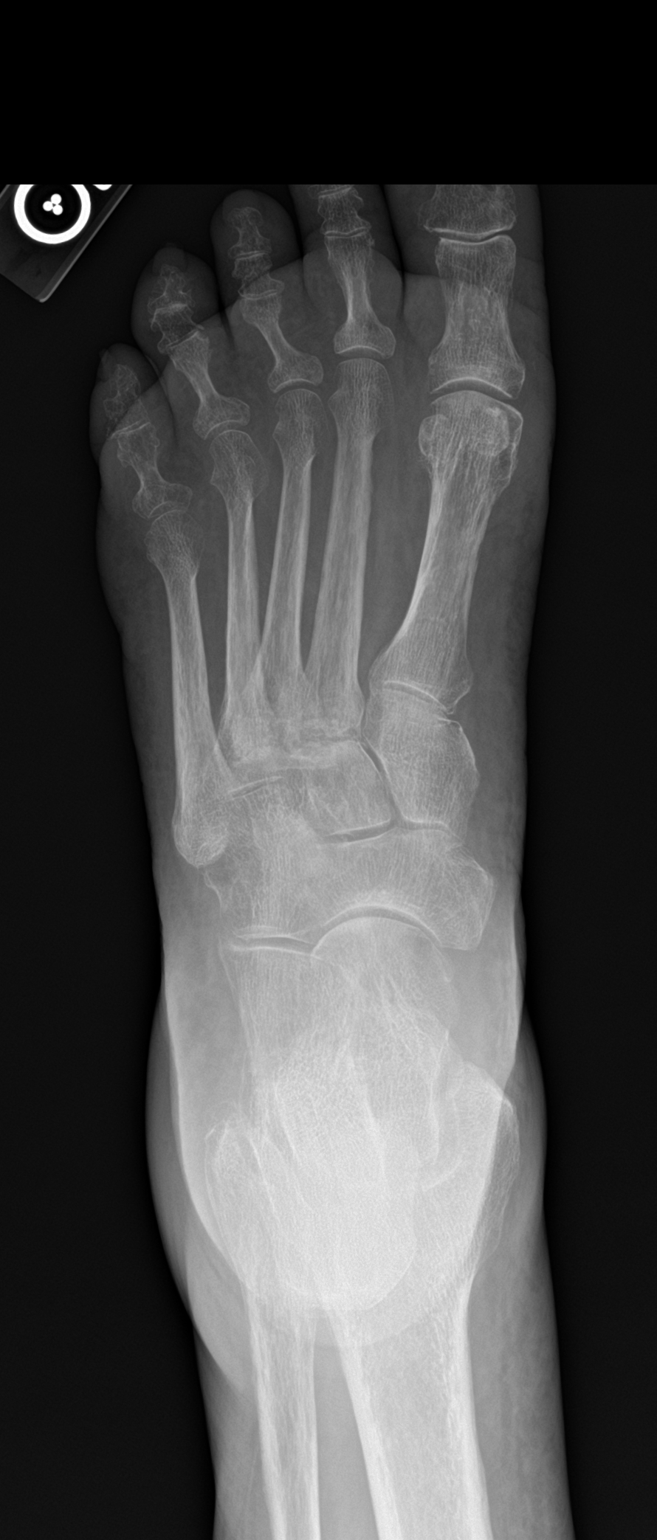

[foot obl]
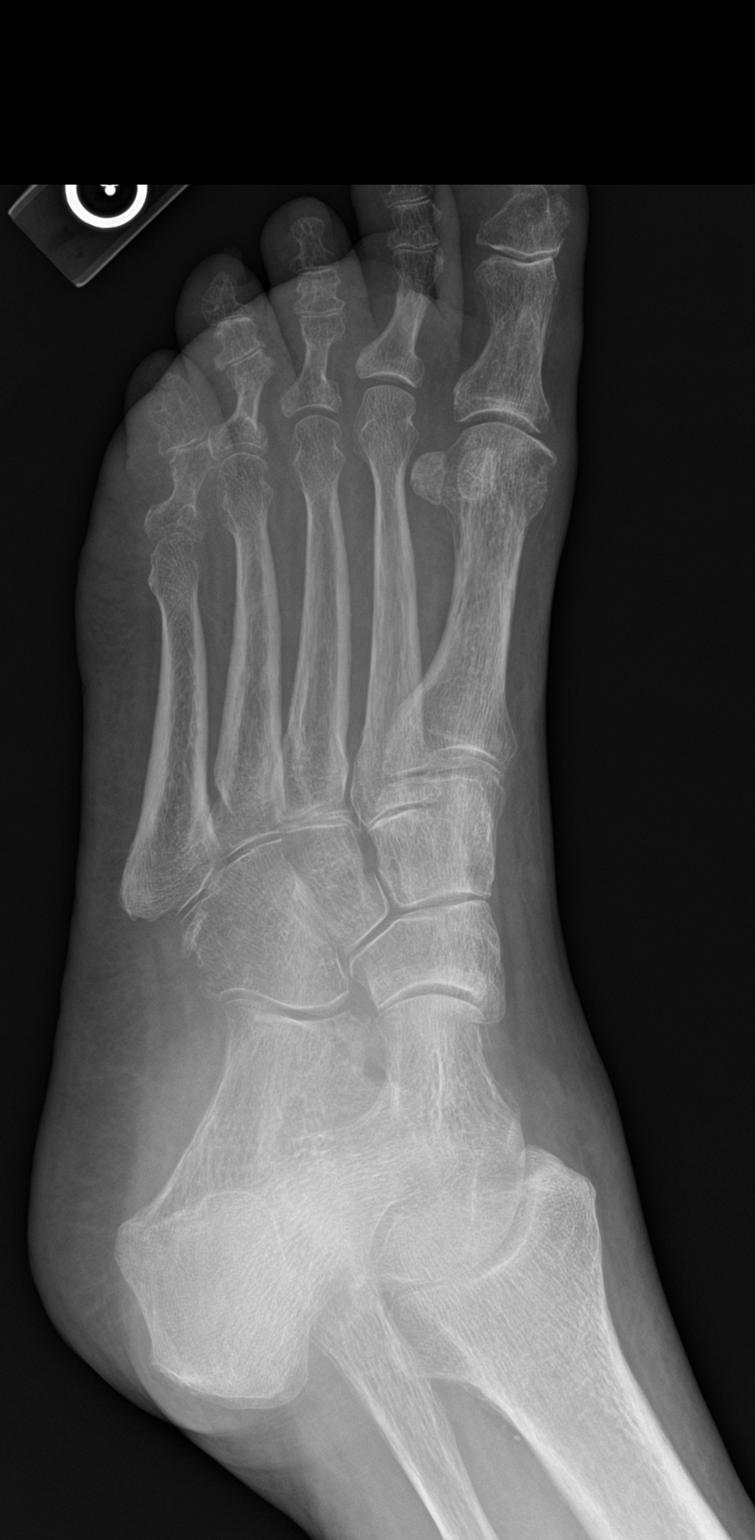

[foot lat]
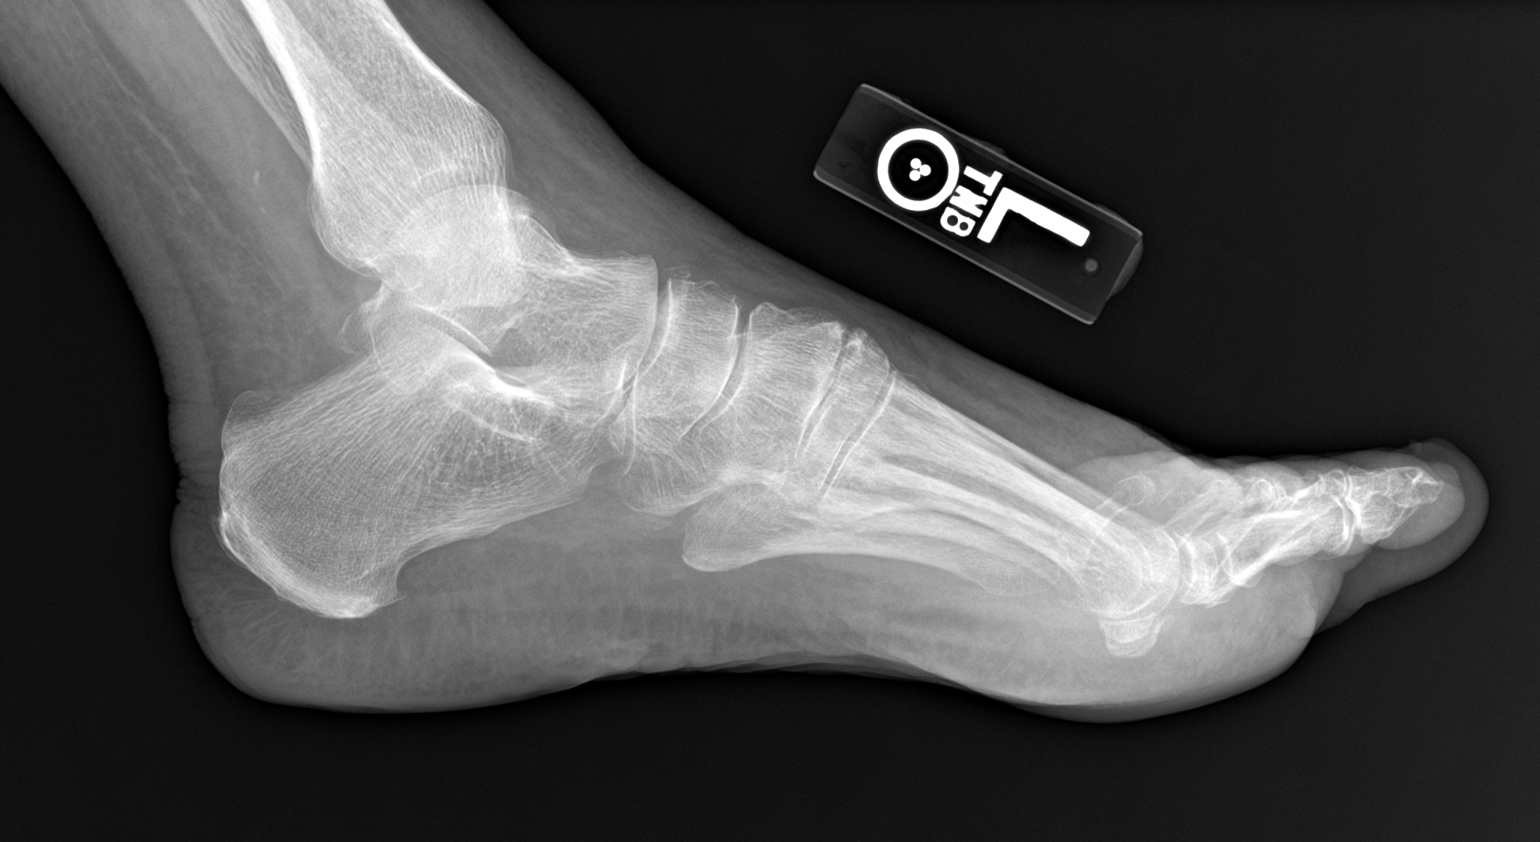

[3 of 3 positions shown; findings below may reference images not displayed]

FINDINGS: There is no evidence of fracture or dislocation. There is no
evidence of arthropathy or other focal bone abnormality. Soft
tissues are unremarkable.
IMPRESSION: Negative.

## 2020-05-17 DIAGNOSIS — Z6829 Body mass index (BMI) 29.0-29.9, adult: Secondary | ICD-10-CM | POA: Diagnosis not present

## 2020-05-17 DIAGNOSIS — R0781 Pleurodynia: Secondary | ICD-10-CM | POA: Diagnosis not present

## 2020-05-17 DIAGNOSIS — F0390 Unspecified dementia without behavioral disturbance: Secondary | ICD-10-CM | POA: Diagnosis not present

## 2020-05-17 DIAGNOSIS — E1139 Type 2 diabetes mellitus with other diabetic ophthalmic complication: Secondary | ICD-10-CM | POA: Diagnosis not present

## 2020-05-17 DIAGNOSIS — B372 Candidiasis of skin and nail: Secondary | ICD-10-CM | POA: Diagnosis not present

## 2020-06-07 DIAGNOSIS — Z20822 Contact with and (suspected) exposure to covid-19: Secondary | ICD-10-CM | POA: Diagnosis not present

## 2020-06-07 DIAGNOSIS — H8309 Labyrinthitis, unspecified ear: Secondary | ICD-10-CM | POA: Diagnosis not present

## 2020-06-07 DIAGNOSIS — Z6828 Body mass index (BMI) 28.0-28.9, adult: Secondary | ICD-10-CM | POA: Diagnosis not present

## 2020-06-07 DIAGNOSIS — R42 Dizziness and giddiness: Secondary | ICD-10-CM | POA: Diagnosis not present

## 2020-06-07 DIAGNOSIS — R197 Diarrhea, unspecified: Secondary | ICD-10-CM | POA: Diagnosis not present

## 2020-06-22 ENCOUNTER — Other Ambulatory Visit: Payer: Self-pay

## 2020-06-22 DIAGNOSIS — I70229 Atherosclerosis of native arteries of extremities with rest pain, unspecified extremity: Secondary | ICD-10-CM

## 2020-06-27 ENCOUNTER — Inpatient Hospital Stay (HOSPITAL_COMMUNITY): Admission: RE | Admit: 2020-06-27 | Payer: PPO | Source: Ambulatory Visit

## 2020-06-27 ENCOUNTER — Ambulatory Visit: Payer: PPO | Admitting: Vascular Surgery

## 2020-07-04 DIAGNOSIS — Z6829 Body mass index (BMI) 29.0-29.9, adult: Secondary | ICD-10-CM | POA: Diagnosis not present

## 2020-07-04 DIAGNOSIS — E785 Hyperlipidemia, unspecified: Secondary | ICD-10-CM | POA: Diagnosis not present

## 2020-07-04 DIAGNOSIS — E2740 Unspecified adrenocortical insufficiency: Secondary | ICD-10-CM | POA: Diagnosis not present

## 2020-07-04 DIAGNOSIS — E039 Hypothyroidism, unspecified: Secondary | ICD-10-CM | POA: Diagnosis not present

## 2020-07-04 DIAGNOSIS — I739 Peripheral vascular disease, unspecified: Secondary | ICD-10-CM | POA: Diagnosis not present

## 2020-07-04 DIAGNOSIS — E1165 Type 2 diabetes mellitus with hyperglycemia: Secondary | ICD-10-CM | POA: Diagnosis not present

## 2020-07-04 DIAGNOSIS — F0390 Unspecified dementia without behavioral disturbance: Secondary | ICD-10-CM | POA: Diagnosis not present

## 2020-07-04 DIAGNOSIS — I10 Essential (primary) hypertension: Secondary | ICD-10-CM | POA: Diagnosis not present

## 2020-07-04 DIAGNOSIS — Z79899 Other long term (current) drug therapy: Secondary | ICD-10-CM | POA: Diagnosis not present

## 2020-08-08 ENCOUNTER — Other Ambulatory Visit: Payer: Self-pay

## 2020-08-08 ENCOUNTER — Emergency Department (HOSPITAL_COMMUNITY)
Admission: EM | Admit: 2020-08-08 | Discharge: 2020-08-08 | Disposition: A | Discharge status: Home / Self Care | Payer: PPO | Attending: Emergency Medicine | Admitting: Emergency Medicine

## 2020-08-08 ENCOUNTER — Encounter (HOSPITAL_COMMUNITY): Payer: Self-pay

## 2020-08-08 ENCOUNTER — Ambulatory Visit: Payer: PPO | Admitting: Vascular Surgery

## 2020-08-08 ENCOUNTER — Emergency Department (HOSPITAL_COMMUNITY): Payer: PPO

## 2020-08-08 ENCOUNTER — Ambulatory Visit (HOSPITAL_COMMUNITY): Payer: PPO

## 2020-08-08 DIAGNOSIS — E119 Type 2 diabetes mellitus without complications: Secondary | ICD-10-CM | POA: Insufficient documentation

## 2020-08-08 DIAGNOSIS — I2510 Atherosclerotic heart disease of native coronary artery without angina pectoris: Secondary | ICD-10-CM | POA: Insufficient documentation

## 2020-08-08 DIAGNOSIS — Z794 Long term (current) use of insulin: Secondary | ICD-10-CM | POA: Diagnosis not present

## 2020-08-08 DIAGNOSIS — Z87891 Personal history of nicotine dependence: Secondary | ICD-10-CM | POA: Insufficient documentation

## 2020-08-08 DIAGNOSIS — S299XXA Unspecified injury of thorax, initial encounter: Secondary | ICD-10-CM | POA: Diagnosis not present

## 2020-08-08 DIAGNOSIS — Z955 Presence of coronary angioplasty implant and graft: Secondary | ICD-10-CM | POA: Diagnosis not present

## 2020-08-08 DIAGNOSIS — M545 Low back pain, unspecified: Secondary | ICD-10-CM | POA: Insufficient documentation

## 2020-08-08 DIAGNOSIS — M419 Scoliosis, unspecified: Secondary | ICD-10-CM | POA: Diagnosis not present

## 2020-08-08 DIAGNOSIS — E039 Hypothyroidism, unspecified: Secondary | ICD-10-CM | POA: Insufficient documentation

## 2020-08-08 DIAGNOSIS — T1490XA Injury, unspecified, initial encounter: Secondary | ICD-10-CM

## 2020-08-08 DIAGNOSIS — Z7902 Long term (current) use of antithrombotics/antiplatelets: Secondary | ICD-10-CM | POA: Insufficient documentation

## 2020-08-08 DIAGNOSIS — I110 Hypertensive heart disease with heart failure: Secondary | ICD-10-CM | POA: Diagnosis not present

## 2020-08-08 DIAGNOSIS — Z7984 Long term (current) use of oral hypoglycemic drugs: Secondary | ICD-10-CM | POA: Diagnosis not present

## 2020-08-08 DIAGNOSIS — I5032 Chronic diastolic (congestive) heart failure: Secondary | ICD-10-CM | POA: Insufficient documentation

## 2020-08-08 DIAGNOSIS — Y92000 Kitchen of unspecified non-institutional (private) residence as  the place of occurrence of the external cause: Secondary | ICD-10-CM | POA: Insufficient documentation

## 2020-08-08 DIAGNOSIS — W19XXXA Unspecified fall, initial encounter: Secondary | ICD-10-CM | POA: Diagnosis not present

## 2020-08-08 DIAGNOSIS — M5459 Other low back pain: Secondary | ICD-10-CM | POA: Diagnosis not present

## 2020-08-08 DIAGNOSIS — M549 Dorsalgia, unspecified: Secondary | ICD-10-CM | POA: Diagnosis not present

## 2020-08-08 DIAGNOSIS — Z743 Need for continuous supervision: Secondary | ICD-10-CM | POA: Diagnosis not present

## 2020-08-08 DIAGNOSIS — M546 Pain in thoracic spine: Secondary | ICD-10-CM | POA: Diagnosis not present

## 2020-08-08 DIAGNOSIS — M810 Age-related osteoporosis without current pathological fracture: Secondary | ICD-10-CM | POA: Diagnosis not present

## 2020-08-08 MED ORDER — METHOCARBAMOL 500 MG PO TABS
500.0000 mg | ORAL_TABLET | Freq: Two times a day (BID) | ORAL | 0 refills | Status: DC | PRN
Start: 2020-08-08 — End: 2020-09-07

## 2020-08-08 MED ORDER — NAPROXEN 500 MG PO TBEC: 500.0000 mg | DELAYED_RELEASE_TABLET | Freq: Two times a day (BID) | ORAL | 0 refills | Status: DC

## 2020-08-08 MED ORDER — KETOROLAC TROMETHAMINE 30 MG/ML IJ SOLN
30.0000 mg | Freq: Once | INTRAMUSCULAR | Status: AC
  Administered 2020-08-08: 30 mg via INTRAVENOUS
  Filled 2020-08-08: qty 1

## 2020-08-08 NOTE — ED Notes (Signed)
Pt's daughter, Thurston Pounds, [phone].

## 2020-08-08 NOTE — Discharge Instructions (Signed)
X-rays show no broken bones, you may take naproxen twice a day as needed, Robaxin twice a day as needed for muscle relaxer.  ER for worsening symptoms  See your doctor as needed in next 2-3 days if pain persists.

## 2020-08-08 NOTE — ED Provider Notes (Signed)
[facility] EMERGENCY DEPARTMENT Provider Note   CSN: [id number] Arrival date & time: 08/08/20  2048     History Chief Complaint  Patient presents with  . [place] is a 85 y.o. female.  HPI   This patient is an 85 year old female with a history of coronary disease, congestive heart failure, she also has a history of diabetes and hypertension.  She lives with her family at her house in [place].  The patient reports that she had a fall while she was in her kitchen, reports that she landed on her back, the paramedics transported the patient to the hospital with back pain, there was no loss of consciousness and the patient is moving all extremities though she does complain of some left leg pain and numbness.  She is also complaining of mid lower back pain.  She does not have a headache does not have chest pain coughing shortness of breath or palpitations.  This occurred just prior to arrival, no medications given prehospital  Past Medical History:  Diagnosis Date  . Anemia   . CAD (coronary artery disease)    a. 2013 NSTEMI s/p DES to LAD. b. 04/03/14: NSTEMI s/p DES to OM1, DES to dRCA.  [name] Chronic diastolic CHF (congestive heart failure) ([place])    a. 2D ECHO 04/02/14 with hypokinesis in the basal inferior and inferoseptal walls. Focal and moderate concentric LVH. Overall LVEF 60-65%. G1DD. Elevated EDLV filling pressures. Mild TR.  . Diverticulosis   . GERD (gastroesophageal reflux disease)   . History of GI bleed    a. 2013: adm for ABL anemia. 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  [name] HLD (hyperlipidemia)   . HTN (hypertension)   . Hypothyroid   . Memory disorder 10/06/2017  . Peripheral vascular disease ([place])   . Refusal of blood transfusions as patient is Jehovah's Witness 09-29-12  . Serrated polyp of colon 2013  . Type II diabetes  mellitus [facility])     Patient Active Problem List   Diagnosis Date Noted  . Adrenal insufficiency ([place]) 03/09/2019  . Hypothermia   . Acute metabolic encephalopathy due to hypoglycemia   . Hypoglycemia 03/07/2019  . Arthralgia of left temporomandibular joint 02/09/2018  . Referred otalgia of left ear 02/09/2018  . Memory disorder 10/06/2017  . Insulin dependent diabetes mellitus 11/19/2016  . Chest pain 11/18/2016  . Diabetes mellitus without complication ([place]) 48/88/9169  . Dysthymia 06/20/2014  . Hypertensive heart disease 04/11/2014  . Anemia   . History of GI bleed   . CAD (coronary artery disease)   . HLD (hyperlipidemia)   . Peripheral vascular disease ([place])   . Chronic diastolic CHF (congestive heart failure) ([place])   . Essential hypertension   . NSTEMI (non-ST elevated myocardial infarction) ([place]) 04/01/2014  . Refusal of blood transfusions as patient is Jehovah's Witness 09/29/2012  . Blood loss anemia 02/26/2012  . Melena 02/25/2012  . GERD (gastroesophageal reflux disease) 11/11/2011  . DM type 2 (diabetes mellitus, type 2) ([place]) 05/26/2011  . Hypothyroid     Past Surgical History:  Procedure Laterality Date  . CESAREAN SECTION  ~ 1961; 1966   plus 3 NVD  . COLONOSCOPY  02/27/2012   Procedure: COLONOSCOPY;  Surgeon: Lear Ng, MD;  Location: [facility] ENDOSCOPY;  Service: Endoscopy;  Laterality: N/A;  . CORONARY ANGIOPLASTY WITH STENT PLACEMENT  05/2011; 04/03/2014  .  ESOPHAGOGASTRODUODENOSCOPY  02/26/2012   Procedure: ESOPHAGOGASTRODUODENOSCOPY (EGD);  Surgeon: Lear Ng, MD;  Location: [facility] ENDOSCOPY;  Service: Endoscopy;  Laterality: N/A;  . LEFT HEART CATHETERIZATION WITH CORONARY ANGIOGRAM N/A 06/12/2011   Procedure: LEFT HEART CATHETERIZATION WITH CORONARY ANGIOGRAM;  Surgeon: Josue Hector, MD;  Location: [facility] CATH LAB;  Service: Cardiovascular;  Laterality: N/A;  . LEFT HEART CATHETERIZATION WITH CORONARY ANGIOGRAM N/A 04/03/2014   Procedure: LEFT  HEART CATHETERIZATION WITH CORONARY ANGIOGRAM;  Surgeon: Jettie Booze, MD;  Location: [facility] CATH LAB;  Service: Cardiovascular;  Laterality: N/A;  . TONSILLECTOMY    . VAGINAL HYSTERECTOMY       OB History   No obstetric history on file.     Family History  Problem Relation Age of Onset  . Arthritis Father   . Heart disease Mother   . Varicose Veins Mother   . Hypertension Other     Social History   Tobacco Use  . Smoking status: Former Smoker    Packs/day: 1.50    Years: 16.00    Pack years: 24.00    Types: Cigarettes    Quit date: 03/15/1970    Years since quitting: 50.4  . Smokeless tobacco: Never Used  Vaping Use  . Vaping Use: Never used  Substance Use Topics  . Alcohol use: Yes    Alcohol/week: 3.0 - 4.0 standard drinks    Types: 3 - 4 Standard drinks or equivalent per week    Comment: 04/03/2014 "glass of wine or a beer 2-3 times/month"  . Drug use: No    Home Medications Prior to Admission medications   Medication Sig Start Date End Date Taking? Authorizing Provider  methocarbamol (ROBAXIN) 500 MG tablet Take 1 tablet (500 mg total) by mouth 2 (two) times daily as needed for muscle spasms. 08/08/20  Yes Noemi Chapel, MD  naproxen (EC NAPROSYN) 500 MG EC tablet Take 1 tablet (500 mg total) by mouth 2 (two) times daily with a meal. 08/08/20  Yes Noemi Chapel, MD  atorvastatin (LIPITOR) 80 MG tablet TAKE 1 TABLET BY MOUTH ONCE DAILY. Patient taking differently: Take 80 mg by mouth every evening.  12/22/17   Sueanne Margarita, MD  carvedilol (COREG) 3.125 MG tablet TAKE 1 TABLET BY MOUTH TWICE DAILY Patient taking differently: Take 3.125 mg by mouth every evening.  11/17/17   Sueanne Margarita, MD  clopidogrel (PLAVIX) 75 MG tablet TAKE 1 TABLET BY MOUTH ONCE DAILY. Patient taking differently: Take 75 mg by mouth every evening.  11/17/17   Sueanne Margarita, MD  donepezil (ARICEPT) 10 MG tablet Take 10 mg by mouth every evening. 08/17/19   [provider]   escitalopram (LEXAPRO) 20 MG tablet Take 1 tablet (20 mg total) by mouth every evening. 03/09/19   Al Decant, MD  fluticasone [facility]) 50 MCG/ACT nasal spray Place 2 sprays into both nostrils daily. 08/17/19   [provider]  furosemide (LASIX) 20 MG tablet TAKE 1 TABLET BY MOUTH ONCE DAILY. Patient not taking: Reported on 03/07/2019 12/22/17   Sueanne Margarita, MD  hydrocortisone (CORTEF) 5 MG tablet Take 3 tablets by mouth every evening. 08/17/19   [provider]  ibuprofen (ADVIL,MOTRIN) 200 MG tablet Take 400 mg by mouth every 6 (six) hours as needed for mild pain.    [provider]  LEVEMIR FLEXTOUCH 100 UNIT/ML FlexPen Inject 20 Units into the skin every evening. 08/17/19   [provider]  levothyroxine (SYNTHROID) 100 MCG tablet Take 1  tablet (100 mcg total) by mouth daily at 6 (six) AM. Patient not taking: Reported on 08/26/2019 03/10/19   Al Decant, MD  levothyroxine (SYNTHROID) 125 MCG tablet Take 1 tablet by mouth every evening. 08/17/19   [provider]  losartan (COZAAR) 50 MG tablet Take 50 mg by mouth every evening.  11/08/18   [provider]  metFORMIN (GLUCOPHAGE-XR) 500 MG 24 hr tablet Take 1,000 mg by mouth every evening. 02/28/19   [provider]  naproxen sodium (ALEVE) 220 MG tablet Take 220 mg by mouth daily as needed (pain).     [provider]  nitroGLYCERIN (NITROSTAT) 0.4 MG SL tablet Place 0.4 mg under the tongue every 5 (five) minutes as needed for chest pain. Reported on 10/04/2015    [provider]  polyethylene glycol (MIRALAX / GLYCOLAX) packet Take 17 g by mouth daily as needed for mild constipation. Reported on 10/04/2015    [provider]  Odin X 5/16" 0.5 ML MISC  12/31/16   [provider]  ZETIA 10 MG tablet TAKE 1 TABLET BY MOUTH EVERY DAY Patient taking differently: Take 10 mg by mouth every evening.  03/05/15   Sueanne Margarita, MD     Allergies    Tetanus toxoid, adsorbed  Review of Systems   Review of Systems  All other systems reviewed and are negative.   Physical Exam Updated Vital Signs BP (!) 180/86 (BP Location: Left Arm)   Pulse 61   Temp 98.5 F (36.9 C) (Oral)   Resp 17   SpO2 100%   Physical Exam Vitals and nursing note reviewed.  Constitutional:      General: She is not in acute distress.    Appearance: She is well-developed.  HENT:     Head: Normocephalic and atraumatic.     Mouth/Throat:     Pharynx: No oropharyngeal exudate.  Eyes:     General: No scleral icterus.       Right eye: No discharge.        Left eye: No discharge.     Conjunctiva/sclera: Conjunctivae normal.     Pupils: Pupils are equal, round, and reactive to light.  Neck:     Thyroid: No thyromegaly.     Vascular: No JVD.  Cardiovascular:     Rate and Rhythm: Normal rate and regular rhythm.     Heart sounds: Normal heart sounds. No murmur heard. No friction rub. No gallop.   Pulmonary:     Effort: Pulmonary effort is normal. No respiratory distress.     Breath sounds: Normal breath sounds. No wheezing or rales.  Abdominal:     General: Bowel sounds are normal. There is no distension.     Palpations: Abdomen is soft. There is no mass.     Tenderness: There is no abdominal tenderness.  Musculoskeletal:        General: Tenderness present. Normal range of motion.     Cervical back: Normal range of motion and neck supple.     Comments: There is tenderness to palpation over T12-L1, there is no paraspinal tenderness, the patient is able to fully straight leg raise both legs against resistance with normal strength, she has equal bilateral symmetrical sensation to light touch and preserved reflexes at the bilateral patellar tendons.  Lymphadenopathy:     Cervical: No cervical adenopathy.  Skin:    General: Skin is warm and dry.     Findings: No erythema or rash.  Neurological:  Mental Status: She is alert.      Coordination: Coordination normal.     Comments: See above, normal preserved strength sensation and reflexes of the lower extremities bilaterally  Psychiatric:        Behavior: Behavior normal.     ED Results / Procedures / Treatments   Labs (all labs ordered are listed, but only abnormal results are displayed) Labs Reviewed - No data to display  EKG None  Radiology DG Thoracic Spine W/Swimmers  Result Date: 08/08/2020 CLINICAL DATA:  Trauma with back pain. Pain after fall. EXAM: THORACIC SPINE - 3 VIEWS COMPARISON:  None. FINDINGS: Mild levo scoliotic curvature of the upper lumbar spine. Bones are diffusely under mineralized. Question of segmentation anomaly involving T4-T5. Multilevel endplate spurring. No evidence of acute fracture no paravertebral soft tissue abnormality to suggest fracture. IMPRESSION: 1. No fracture or subluxation of the thoracic spine. 2. Question of segmentation anomaly involving T4-T5. 3. Osteopenia/osteoporosis. Electronically Signed   By: Keith Rake M.D.   On: 08/08/2020 22:45   DG Lumbar Spine Complete  Result Date: 08/08/2020 CLINICAL DATA:  Trauma with back pain.  Pain after fall. EXAM: LUMBAR SPINE - COMPLETE 4+ VIEW COMPARISON:  None. FINDINGS: Bones are diffusely under mineralized. Trace anterolisthesis of L5 on S1. Alignment is otherwise normal. No acute fracture. Vertebral body heights are preserved. S1 appears partially lumbarized. Facet hypertrophy at L5-S1. Disc spaces are preserved. Sacroiliac joints are congruent. IMPRESSION: 1. No fracture of the lumbar spine. 2. Mild degenerative change with facet hypertrophy at L5-S1. Electronically Signed   By: Keith Rake M.D.   On: 08/08/2020 22:43    Procedures Procedures   Medications Ordered in ED Medications  ketorolac (TORADOL) 30 MG/ML injection 30 mg (30 mg Intravenous Given 08/08/20 2115)    ED Course  I have reviewed the triage vital signs and the nursing notes.  Pertinent labs &  imaging results that were available during my care of the patient were reviewed by me and considered in my medical decision making (see chart for details).    MDM Rules/Calculators/A&P                          The patient has had a fall onto her back and is having some mid back pain, will make sure she does not have a compression fracture or other traumatic injury.  She does not have any neurologic symptoms to suggest the need for advanced neuroimaging at this time, pain medications ordered  X-rays negative, patient well-appearing, states that she wants to go home, I think this is reasonable, neurologically totally intact  Final Clinical Impression(s) / ED Diagnoses Final diagnoses:  Acute bilateral low back pain without sciatica    Rx / DC Orders ED Discharge Orders         Ordered    methocarbamol (ROBAXIN) 500 MG tablet  2 times daily PRN        08/08/20 2306    naproxen (EC NAPROSYN) 500 MG EC tablet  2 times daily with meals        08/08/20 2306           Noemi Chapel, MD 08/08/20 2307

## 2020-08-08 NOTE — ED Notes (Signed)
Patient transported to X-ray 

## 2020-08-08 NOTE — ED Notes (Signed)
Pt returned from radiology.

## 2020-08-08 NOTE — ED Notes (Signed)
Spoke with pt's daughter, she will come pick her up.

## 2020-08-08 NOTE — ED Triage Notes (Signed)
Pt bib [employer]. Pt tripped over a rug in her kitchen and fell onto the floor. Pt c/o lower back pain. EMS states pt did not have LOC and has been moving all extremities since their arrival. Pt moving all extremities on arrival to ED, states her left leg and her back are hurting. Pt is disoriented to time which family informed EMS is patient's norm. EMS VSS.

## 2020-08-08 NOTE — ED Notes (Signed)
Patient back from x-ray 

## 2020-08-22 ENCOUNTER — Inpatient Hospital Stay (HOSPITAL_COMMUNITY)
Admission: EM | Admit: 2020-08-22 | Discharge: 2020-08-28 | DRG: 552 | Disposition: A | Discharge status: Home Health | Payer: PPO | Attending: Family Medicine | Admitting: Family Medicine

## 2020-08-22 ENCOUNTER — Emergency Department (HOSPITAL_COMMUNITY): Payer: PPO

## 2020-08-22 ENCOUNTER — Encounter (HOSPITAL_COMMUNITY): Payer: Self-pay | Admitting: Emergency Medicine

## 2020-08-22 DIAGNOSIS — Z8249 Family history of ischemic heart disease and other diseases of the circulatory system: Secondary | ICD-10-CM | POA: Diagnosis not present

## 2020-08-22 DIAGNOSIS — I2510 Atherosclerotic heart disease of native coronary artery without angina pectoris: Secondary | ICD-10-CM | POA: Diagnosis present

## 2020-08-22 DIAGNOSIS — F0390 Unspecified dementia without behavioral disturbance: Secondary | ICD-10-CM | POA: Diagnosis not present

## 2020-08-22 DIAGNOSIS — F05 Delirium due to known physiological condition: Secondary | ICD-10-CM | POA: Diagnosis present

## 2020-08-22 DIAGNOSIS — Z955 Presence of coronary angioplasty implant and graft: Secondary | ICD-10-CM | POA: Diagnosis not present

## 2020-08-22 DIAGNOSIS — E039 Hypothyroidism, unspecified: Secondary | ICD-10-CM | POA: Diagnosis present

## 2020-08-22 DIAGNOSIS — Z7984 Long term (current) use of oral hypoglycemic drugs: Secondary | ICD-10-CM | POA: Diagnosis not present

## 2020-08-22 DIAGNOSIS — S32000A Wedge compression fracture of unspecified lumbar vertebra, initial encounter for closed fracture: Secondary | ICD-10-CM | POA: Diagnosis not present

## 2020-08-22 DIAGNOSIS — M4856XA Collapsed vertebra, not elsewhere classified, lumbar region, initial encounter for fracture: Secondary | ICD-10-CM | POA: Diagnosis present

## 2020-08-22 DIAGNOSIS — I252 Old myocardial infarction: Secondary | ICD-10-CM

## 2020-08-22 DIAGNOSIS — Z887 Allergy status to serum and vaccine status: Secondary | ICD-10-CM

## 2020-08-22 DIAGNOSIS — S32010A Wedge compression fracture of first lumbar vertebra, initial encounter for closed fracture: Secondary | ICD-10-CM

## 2020-08-22 DIAGNOSIS — Z87891 Personal history of nicotine dependence: Secondary | ICD-10-CM

## 2020-08-22 DIAGNOSIS — Y92009 Unspecified place in unspecified non-institutional (private) residence as the place of occurrence of the external cause: Secondary | ICD-10-CM

## 2020-08-22 DIAGNOSIS — Z79899 Other long term (current) drug therapy: Secondary | ICD-10-CM | POA: Diagnosis not present

## 2020-08-22 DIAGNOSIS — I5032 Chronic diastolic (congestive) heart failure: Secondary | ICD-10-CM | POA: Diagnosis not present

## 2020-08-22 DIAGNOSIS — E1165 Type 2 diabetes mellitus with hyperglycemia: Secondary | ICD-10-CM | POA: Diagnosis present

## 2020-08-22 DIAGNOSIS — I110 Hypertensive heart disease with heart failure: Secondary | ICD-10-CM | POA: Diagnosis present

## 2020-08-22 DIAGNOSIS — M549 Dorsalgia, unspecified: Secondary | ICD-10-CM | POA: Diagnosis not present

## 2020-08-22 DIAGNOSIS — E785 Hyperlipidemia, unspecified: Secondary | ICD-10-CM | POA: Diagnosis present

## 2020-08-22 DIAGNOSIS — I10 Essential (primary) hypertension: Secondary | ICD-10-CM | POA: Diagnosis present

## 2020-08-22 DIAGNOSIS — E1151 Type 2 diabetes mellitus with diabetic peripheral angiopathy without gangrene: Secondary | ICD-10-CM | POA: Diagnosis present

## 2020-08-22 DIAGNOSIS — Z794 Long term (current) use of insulin: Secondary | ICD-10-CM | POA: Diagnosis not present

## 2020-08-22 DIAGNOSIS — Z8261 Family history of arthritis: Secondary | ICD-10-CM | POA: Diagnosis not present

## 2020-08-22 DIAGNOSIS — S32018A Other fracture of first lumbar vertebra, initial encounter for closed fracture: Secondary | ICD-10-CM | POA: Diagnosis not present

## 2020-08-22 DIAGNOSIS — I959 Hypotension, unspecified: Secondary | ICD-10-CM | POA: Diagnosis not present

## 2020-08-22 DIAGNOSIS — Z20822 Contact with and (suspected) exposure to covid-19: Secondary | ICD-10-CM | POA: Diagnosis not present

## 2020-08-22 DIAGNOSIS — E119 Type 2 diabetes mellitus without complications: Secondary | ICD-10-CM

## 2020-08-22 DIAGNOSIS — W1830XA Fall on same level, unspecified, initial encounter: Secondary | ICD-10-CM | POA: Diagnosis present

## 2020-08-22 DIAGNOSIS — K5790 Diverticulosis of intestine, part unspecified, without perforation or abscess without bleeding: Secondary | ICD-10-CM | POA: Diagnosis not present

## 2020-08-22 DIAGNOSIS — R296 Repeated falls: Secondary | ICD-10-CM | POA: Diagnosis not present

## 2020-08-22 DIAGNOSIS — Z79890 Hormone replacement therapy (postmenopausal): Secondary | ICD-10-CM

## 2020-08-22 DIAGNOSIS — E1159 Type 2 diabetes mellitus with other circulatory complications: Secondary | ICD-10-CM | POA: Diagnosis not present

## 2020-08-22 DIAGNOSIS — R319 Hematuria, unspecified: Secondary | ICD-10-CM | POA: Diagnosis not present

## 2020-08-22 DIAGNOSIS — W19XXXA Unspecified fall, initial encounter: Secondary | ICD-10-CM

## 2020-08-22 DIAGNOSIS — R339 Retention of urine, unspecified: Secondary | ICD-10-CM | POA: Diagnosis not present

## 2020-08-22 DIAGNOSIS — K828 Other specified diseases of gallbladder: Secondary | ICD-10-CM | POA: Diagnosis not present

## 2020-08-22 DIAGNOSIS — E2740 Unspecified adrenocortical insufficiency: Secondary | ICD-10-CM | POA: Diagnosis present

## 2020-08-22 DIAGNOSIS — K8020 Calculus of gallbladder without cholecystitis without obstruction: Secondary | ICD-10-CM | POA: Diagnosis not present

## 2020-08-22 DIAGNOSIS — M545 Low back pain, unspecified: Secondary | ICD-10-CM | POA: Diagnosis not present

## 2020-08-22 LAB — CBC WITH DIFFERENTIAL/PLATELET
Abs Immature Granulocytes: 0.02 10*3/uL (ref 0.00–0.07)
Basophils Absolute: 0 10*3/uL (ref 0.0–0.1)
Basophils Relative: 1 %
Eosinophils Absolute: 0.2 10*3/uL (ref 0.0–0.5)
Eosinophils Relative: 3 %
HCT: 45 % (ref 36.0–46.0)
Hemoglobin: 14 g/dL (ref 12.0–15.0)
Immature Granulocytes: 0 %
Lymphocytes Relative: 25 %
Lymphs Abs: 1.7 10*3/uL (ref 0.7–4.0)
MCH: 28.4 pg (ref 26.0–34.0)
MCHC: 31.1 g/dL (ref 30.0–36.0)
MCV: 91.3 fL (ref 80.0–100.0)
Monocytes Absolute: 0.4 10*3/uL (ref 0.1–1.0)
Monocytes Relative: 7 %
Neutro Abs: 4.3 10*3/uL (ref 1.7–7.7)
Neutrophils Relative %: 64 %
Platelets: 265 10*3/uL (ref 150–400)
RBC: 4.93 MIL/uL (ref 3.87–5.11)
RDW: 15.3 % (ref 11.5–15.5)
WBC: 6.6 10*3/uL (ref 4.0–10.5)
nRBC: 0 % (ref 0.0–0.2)

## 2020-08-22 LAB — COMPREHENSIVE METABOLIC PANEL
ALT: 23 U/L (ref 0–44)
AST: 32 U/L (ref 15–41)
Albumin: 3.6 g/dL (ref 3.5–5.0)
Alkaline Phosphatase: 95 U/L (ref 38–126)
Anion gap: 9 (ref 5–15)
BUN: 13 mg/dL (ref 8–23)
CO2: 24 mmol/L (ref 22–32)
Calcium: 9 mg/dL (ref 8.9–10.3)
Chloride: 108 mmol/L (ref 98–111)
Creatinine, Ser: 0.79 mg/dL (ref 0.44–1.00)
GFR, Estimated: >60 mL/min (ref >60)
Glucose, Bld: 125 mg/dL — ABNORMAL HIGH (ref 70–99)
Potassium: 3.9 mmol/L (ref 3.5–5.1)
Sodium: 141 mmol/L (ref 135–145)
Total Bilirubin: 0.7 mg/dL (ref 0.3–1.2)
Total Protein: 6.6 g/dL (ref 6.5–8.1)

## 2020-08-22 LAB — CBG MONITORING, ED: Glucose-Capillary: 105 mg/dL — ABNORMAL HIGH (ref 70–99)

## 2020-08-22 LAB — TSH: TSH: 98.974 u[IU]/mL — ABNORMAL HIGH (ref 0.350–4.500)

## 2020-08-22 MED ORDER — INSULIN ASPART 100 UNIT/ML IJ SOLN
0.0000 [IU] | Freq: Three times a day (TID) | INTRAMUSCULAR | Status: DC
  Administered 2020-08-23 – 2020-08-24 (×2): 1 [IU] via SUBCUTANEOUS
  Administered 2020-08-25: 2 [IU] via SUBCUTANEOUS
  Administered 2020-08-27 – 2020-08-28 (×2): 1 [IU] via SUBCUTANEOUS

## 2020-08-22 MED ORDER — ZIPRASIDONE MESYLATE 20 MG IM SOLR
20.0000 mg | Freq: Once | INTRAMUSCULAR | Status: DC
  Filled 2020-08-22: qty 20

## 2020-08-22 MED ORDER — ACETAMINOPHEN 650 MG RE SUPP: 650.0000 mg | Freq: Four times a day (QID) | RECTAL | Status: DC | PRN

## 2020-08-22 MED ORDER — STERILE WATER FOR INJECTION IJ SOLN
INTRAMUSCULAR | Status: AC
  Filled 2020-08-22: qty 10

## 2020-08-22 MED ORDER — OXYCODONE HCL 5 MG PO TABS
5.0000 mg | ORAL_TABLET | Freq: Four times a day (QID) | ORAL | Status: DC | PRN
Start: 2020-08-22 — End: 2020-08-28
  Administered 2020-08-25 – 2020-08-28 (×5): 5 mg via ORAL
  Filled 2020-08-22 (×7): qty 1

## 2020-08-22 MED ORDER — ACETAMINOPHEN 325 MG PO TABS: 650.0000 mg | ORAL_TABLET | Freq: Four times a day (QID) | ORAL | Status: DC | PRN

## 2020-08-22 MED ORDER — INSULIN ASPART 100 UNIT/ML IJ SOLN: 0.0000 [IU] | Freq: Every day | INTRAMUSCULAR | Status: DC

## 2020-08-22 MED ORDER — ZIPRASIDONE MESYLATE 20 MG IM SOLR
10.0000 mg | Freq: Once | INTRAMUSCULAR | Status: AC
  Administered 2020-08-22: 10 mg via INTRAMUSCULAR

## 2020-08-22 MED ORDER — LORAZEPAM 2 MG/ML IJ SOLN
0.5000 mg | Freq: Once | INTRAMUSCULAR | Status: AC
  Administered 2020-08-22: 0.5 mg via INTRAVENOUS
  Filled 2020-08-22: qty 1

## 2020-08-22 NOTE — ED Provider Notes (Signed)
[place] EMERGENCY DEPARTMENT Provider Note   CSN: [id number] Arrival date & time: 08/22/20  1403     History Chief Complaint  Patient presents with  . Back Pain    Alyssa [name] is a 85 y.o. female with past medical history significant for anemia, CAD, chronic diastolic CHF, peripheral vascular disease, type 2 diabetes, dementia, hypertension, hypothyroidism.  HPI Patient presents to emergency room today via EMS with chief complaint of back pain x2 weeks.  Pain started after a mechanical fall. Patient unable to provide history 2/2 to dementia.  His daughter is at the bedside and gives history.  She states that since patient's fall x2 weeks ago she has been complaining of ongoing back pain.  She has not gotten out of bed much at all which is unusual for her.  Every time she tries to ambulate she will scream out in pain.  Patient has had 2 more falls.  She was not seen in the emergency department afterwards for either of them.  The last 1 was x4 days ago.  With these falls they deny her hitting her head or loss of consciousness.  They state her memory has been progressively worsening with her known dementia, no acute changes there.  Yesterday patient's other daughter noticed that she had painless hematuria.  She has no history of UTI or kidney stones.  Patient has also had decreased p.o. intake and not been compliant with taking her medications.  Patient lives with her husband and daughters check on her daily.   Past Medical History:  Diagnosis Date  . Anemia   . CAD (coronary artery disease)    a. 2013 NSTEMI s/p DES to LAD. b. 04/03/14: NSTEMI s/p DES to OM1, DES to dRCA.  [name] Chronic diastolic CHF (congestive heart failure) ([place])    a. 2D ECHO 04/02/14 with hypokinesis in the basal inferior and inferoseptal walls. Focal and moderate concentric LVH. Overall LVEF 60-65%. G1DD. Elevated EDLV filling pressures. Mild TR.  . Diverticulosis   . GERD (gastroesophageal  reflux disease)   . History of GI bleed    a. 2013: adm for ABL anemia. 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  [name] HLD (hyperlipidemia)   . HTN (hypertension)   . Hypothyroid   . Memory disorder 10/06/2017  . Peripheral vascular disease ([place])   . Refusal of blood transfusions as patient is Jehovah's Witness 09-29-12  . Serrated polyp of colon 2013  . Type II diabetes mellitus [facility])     Patient Active Problem List   Diagnosis Date Noted  . Lumbar compression fracture ([place]) 08/22/2020  . Adrenal insufficiency ([place]) 03/09/2019  . Hypothermia   . Acute metabolic encephalopathy due to hypoglycemia   . Hypoglycemia 03/07/2019  . Arthralgia of left temporomandibular joint 02/09/2018  . Referred otalgia of left ear 02/09/2018  . Memory disorder 10/06/2017  . Insulin dependent diabetes mellitus 11/19/2016  . Chest pain 11/18/2016  . Diabetes mellitus without complication ([place]) [id number]  . Dysthymia 06/20/2014  . Hypertensive heart disease 04/11/2014  . Anemia   . History of GI bleed   . CAD (coronary artery disease)   . HLD (hyperlipidemia)   . Peripheral vascular disease ([place])   . Chronic diastolic CHF (congestive heart failure) ([place])   . Essential hypertension   . NSTEMI (non-ST elevated myocardial infarction) ([place]) 04/01/2014  . Refusal of blood transfusions as patient is Jehovah's Witness  09/29/2012  . Blood loss anemia 02/26/2012  . Melena 02/25/2012  . GERD (gastroesophageal reflux disease) 11/11/2011  . DM type 2 (diabetes mellitus, type 2) ([place]) 05/26/2011  . Hypothyroid     Past Surgical History:  Procedure Laterality Date  . CESAREAN SECTION  ~ 1961; 1966   plus 3 NVD  . COLONOSCOPY  02/27/2012   Procedure: COLONOSCOPY;  Surgeon: Lear Ng, MD;  Location: [facility] ENDOSCOPY;  Service: Endoscopy;  Laterality: N/A;  . CORONARY ANGIOPLASTY WITH STENT PLACEMENT   05/2011; 04/03/2014  . ESOPHAGOGASTRODUODENOSCOPY  02/26/2012   Procedure: ESOPHAGOGASTRODUODENOSCOPY (EGD);  Surgeon: Lear Ng, MD;  Location: [facility] ENDOSCOPY;  Service: Endoscopy;  Laterality: N/A;  . LEFT HEART CATHETERIZATION WITH CORONARY ANGIOGRAM N/A 06/12/2011   Procedure: LEFT HEART CATHETERIZATION WITH CORONARY ANGIOGRAM;  Surgeon: Josue Hector, MD;  Location: [facility] CATH LAB;  Service: Cardiovascular;  Laterality: N/A;  . LEFT HEART CATHETERIZATION WITH CORONARY ANGIOGRAM N/A 04/03/2014   Procedure: LEFT HEART CATHETERIZATION WITH CORONARY ANGIOGRAM;  Surgeon: Jettie Booze, MD;  Location: [facility] CATH LAB;  Service: Cardiovascular;  Laterality: N/A;  . TONSILLECTOMY    . VAGINAL HYSTERECTOMY       OB History   No obstetric history on file.     Family History  Problem Relation Age of Onset  . Arthritis Father   . Heart disease Mother   . Varicose Veins Mother   . Hypertension Other     Social History   Tobacco Use  . Smoking status: Former Smoker    Packs/day: 1.50    Years: 16.00    Pack years: 24.00    Types: Cigarettes    Quit date: 03/15/1970    Years since quitting: 50.4  . Smokeless tobacco: Never Used  Vaping Use  . Vaping Use: Never used  Substance Use Topics  . Alcohol use: Yes    Alcohol/week: 3.0 - 4.0 standard drinks    Types: 3 - 4 Standard drinks or equivalent per week    Comment: 04/03/2014 "glass of wine or a beer 2-3 times/month"  . Drug use: No    Home Medications Prior to Admission medications   Medication Sig Start Date End Date Taking? Authorizing Provider  atorvastatin (LIPITOR) 80 MG tablet TAKE 1 TABLET BY MOUTH ONCE DAILY. Patient taking differently: Take 80 mg by mouth every evening.  12/22/17   Sueanne Margarita, MD  carvedilol (COREG) 3.125 MG tablet TAKE 1 TABLET BY MOUTH TWICE DAILY Patient taking differently: Take 3.125 mg by mouth every evening.  11/17/17   Sueanne Margarita, MD  clopidogrel (PLAVIX) 75 MG tablet TAKE 1  TABLET BY MOUTH ONCE DAILY. Patient taking differently: Take 75 mg by mouth every evening.  11/17/17   Sueanne Margarita, MD  donepezil (ARICEPT) 10 MG tablet Take 10 mg by mouth every evening. 08/17/19   [provider]  escitalopram (LEXAPRO) 20 MG tablet Take 1 tablet (20 mg total) by mouth every evening. 03/09/19   Al Decant, MD  fluticasone [facility]) 50 MCG/ACT nasal spray Place 2 sprays into both nostrils daily. 08/17/19   [provider]  furosemide (LASIX) 20 MG tablet TAKE 1 TABLET BY MOUTH ONCE DAILY. Patient not taking: Reported on 03/07/2019 12/22/17   Sueanne Margarita, MD  hydrocortisone (CORTEF) 5 MG tablet Take 3 tablets by mouth every evening. 08/17/19   [provider]  ibuprofen (ADVIL,MOTRIN) 200 MG tablet Take 400 mg by mouth every 6 (six) hours as needed for mild  pain.    [provider]  LEVEMIR FLEXTOUCH 100 UNIT/ML FlexPen Inject 20 Units into the skin every evening. 08/17/19   [provider]  levothyroxine (SYNTHROID) 100 MCG tablet Take 1 tablet (100 mcg total) by mouth daily at 6 (six) AM. Patient not taking: Reported on 08/26/2019 03/10/19   Al Decant, MD  levothyroxine (SYNTHROID) 125 MCG tablet Take 1 tablet by mouth every evening. 08/17/19   [provider]  losartan (COZAAR) 50 MG tablet Take 50 mg by mouth every evening.  11/08/18   [provider]  metFORMIN (GLUCOPHAGE-XR) 500 MG 24 hr tablet Take 1,000 mg by mouth every evening. 02/28/19   [provider]  methocarbamol (ROBAXIN) 500 MG tablet Take 1 tablet (500 mg total) by mouth 2 (two) times daily as needed for muscle spasms. 08/08/20   Noemi Chapel, MD  naproxen (EC NAPROSYN) 500 MG EC tablet Take 1 tablet (500 mg total) by mouth 2 (two) times daily with a meal. 08/08/20   Noemi Chapel, MD  naproxen sodium (ALEVE) 220 MG tablet Take 220 mg by mouth daily as needed (pain).     [provider]  nitroGLYCERIN (NITROSTAT) 0.4 MG SL tablet  Place 0.4 mg under the tongue every 5 (five) minutes as needed for chest pain. Reported on 10/04/2015    [provider]  polyethylene glycol (MIRALAX / GLYCOLAX) packet Take 17 g by mouth daily as needed for mild constipation. Reported on 10/04/2015    [provider]  [place] X 5/16" 0.5 ML MISC  12/31/16   [provider]  ZETIA 10 MG tablet TAKE 1 TABLET BY MOUTH EVERY DAY Patient taking differently: Take 10 mg by mouth every evening.  03/05/15   Sueanne Margarita, MD    Allergies    Tetanus toxoid, adsorbed  Review of Systems   Review of Systems  Unable to perform ROS: Dementia    Physical Exam Updated Vital Signs BP (!) 149/79   Pulse 84   Temp 98 F (36.7 C) (Oral)   Resp 15   SpO2 97%   Physical Exam Vitals and nursing note reviewed.  Constitutional:      General: She is not in acute distress.    Appearance: She is not ill-appearing.  HENT:     Head: Normocephalic and atraumatic.     Right Ear: Tympanic membrane and external ear normal.     Left Ear: Tympanic membrane and external ear normal.     Nose: Nose normal.     Mouth/Throat:     Mouth: Mucous membranes are moist.     Pharynx: Oropharynx is clear.  Eyes:     General: No scleral icterus.       Right eye: No discharge.        Left eye: No discharge.     Extraocular Movements: Extraocular movements intact.     Conjunctiva/sclera: Conjunctivae normal.     Pupils: Pupils are equal, round, and reactive to light.  Neck:     Vascular: No JVD.  Cardiovascular:     Rate and Rhythm: Normal rate and regular rhythm.     Pulses: Normal pulses.          Radial pulses are 2+ on the right side and 2+ on the left side.       Dorsalis pedis pulses are 2+ on the right side and 2+ on the left side.     Heart sounds: Normal heart sounds.  Pulmonary:  Comments: Lungs clear to auscultation in all fields. Symmetric chest rise. No wheezing, rales, or rhonchi. Abdominal:      Comments: Abdomen is soft, non-distended, and non-tender in all quadrants. No rigidity, no guarding. No peritoneal signs.  Musculoskeletal:        General: Normal range of motion.     Cervical back: Normal range of motion.       Back:     Comments: No tenderness to palpation of the spinous processes of the T-spine or L-spine No crepitus, deformity or step-offs Tenderness to palpation of the right paraspinous muscles of the L-spine       Skin:    General: Skin is warm and dry.     Capillary Refill: Capillary refill takes less than 2 seconds.  Neurological:     GCS: GCS eye subscore is 4. GCS verbal subscore is 5. GCS motor subscore is 6.     Comments: Alert to self only. Fluent speech, no facial droop.  Patient follows commands  No saddle anesthesia. Sensation intact in all extremities. Strong and equal grip strength in all extremities.  Psychiatric:        Behavior: Behavior normal.     ED Results / Procedures / Treatments   Labs (all labs ordered are listed, but only abnormal results are displayed) Labs Reviewed  COMPREHENSIVE METABOLIC PANEL - Abnormal; Notable for the following components:      Result Value   Glucose, Bld 125 (*)    All other components within normal limits  URINE CULTURE  CBC WITH DIFFERENTIAL/PLATELET  URINALYSIS, ROUTINE W REFLEX MICROSCOPIC  TSH    EKG None  Radiology CT L-SPINE NO CHARGE  Result Date: 08/22/2020 CLINICAL DATA:  Back pain for 2 weeks after fall in April. EXAM: CT LUMBAR SPINE WITHOUT CONTRAST TECHNIQUE: Multidetector CT imaging of the lumbar spine was performed without intravenous contrast administration. Multiplanar CT image reconstructions were also generated. COMPARISON:  Lumbar radiograph 08/08/2020 FINDINGS: Segmentation: 5 lumbar type vertebrae. Alignment: No listhesis. Vertebrae: Mild L1 superior endplate compression fracture likely subacute. There is approximately 30% loss of height centrally. Mild buckling of the  posterior cortex without significant mass effect on the spinal canal. No involvement of the posterior elements. No additional fracture. The remaining vertebral body heights are preserved. No sacral fracture. Paraspinal and other soft tissues: No significant paraspinal hemorrhage. Aortic and branch atherosclerosis. Abdominal structures assessed on concurrent abdominopelvic CT, reported separately. Disc levels: Mild T12-L1 disc space narrowing and vacuum phenomenon. Disc spaces are preserved. No significant mass effect on the spinal canal related to L1 fracture. There is no canal narrowing. Facet hypertrophy at L5-S1. IMPRESSION: Mild L1 superior endplate compression fracture with approximately 30% loss of height centrally. Mild buckling of the posterior cortex without significant mass effect on the spinal canal. Findings are likely subacute. Electronically Signed   By: Keith Rake M.D.   On: 08/22/2020 16:37   CT Renal Stone Study  Result Date: 08/22/2020 CLINICAL DATA:  Back pain for several weeks following recent fall with hematuria, initial encounter EXAM: CT ABDOMEN AND PELVIS WITHOUT CONTRAST TECHNIQUE: Multidetector CT imaging of the abdomen and pelvis was performed following the standard protocol without IV contrast. COMPARISON:  08/08/2020 FINDINGS: Lower chest: No acute abnormality. Hepatobiliary: Liver is within normal limits. Gallbladder is well distended with dependent gallstones. No ductal dilatation is seen. Pancreas: Unremarkable. No pancreatic ductal dilatation or surrounding inflammatory changes. Spleen: Normal in size without focal abnormality. Adrenals/Urinary Tract: Adrenal glands are within normal limits.  Kidneys show vague hypodensities likely representing small cysts. No renal calculi or obstructive changes are noted. The ureters are within normal limits to the level of the urinary bladder. Bladder is well distended. Stomach/Bowel: Scattered diverticular changes noted without evidence  of diverticulitis. Scattered fecal material is noted throughout the colon. No obstructive or inflammatory changes are seen. The appendix is within normal limits. No inflammatory changes are seen. Small bowel and stomach are unremarkable. Vascular/Lymphatic: Aortic atherosclerosis. No enlarged abdominal or pelvic lymph nodes. Reproductive: Status post hysterectomy. No adnexal masses. Other: No abdominal wall hernia or abnormality. No abdominopelvic ascites. Musculoskeletal: Degenerative changes of lumbar spine are noted. Partial lumbarization of S1 is seen. There is a superior endplate compression deformity at L1 identified which was not seen on the recent plain film examination and may have been related to the recent fall. No significant retropulsion is noted. No other focal bony abnormality is noted. IMPRESSION: L1 compression fracture not present on prior plain film examination and likely related to the recent injury. Diverticular change without diverticulitis. Cholelithiasis without complicating factors. No other focal abnormality is noted. Electronically Signed   By: Inez Catalina M.D.   On: 08/22/2020 16:34    Procedures Procedures   Medications Ordered in ED Medications  sterile water (preservative free) injection (has no administration in time range)  LORazepam (ATIVAN) injection 0.5 mg (0.5 mg Intravenous Given 08/22/20 1655)  ziprasidone (GEODON) injection 10 mg (10 mg Intramuscular Given 08/22/20 1747)    ED Course  I have reviewed the triage vital signs and the nursing notes.  Pertinent labs & imaging results that were available during my care of the patient were reviewed by me and considered in my medical decision making (see chart for details).    MDM Rules/Calculators/A&P                          History provided by patient's daughter with additional history obtained from chart review.    Patient presenting with back pain and hematuria per family.  On ED arrival she is  well-appearing, no acute distress.  Patient is demented and at her baseline per daughter at the bedside.  On exam she has tenderness palpation of right paraspinal muscles of lumbar spine.  No midline tenderness or step-offs.  No overlying skin changes.  Her abdomen is nontender. As patient has had decreased p.o. intake and not compliant with medications over the last week we will check basic labs including TSH as she has history of hypothyroidism. CT renal is negative for kidney stone.  CT L-spine shows L1 compression fracture likely subacute with 30% height loss. This is consistent with patient's history as she has had multiple falls recently.  Patient became agitated and aggressive.  Given low-dose of IV Ativan which calmed her down however she continues to be aggressive.  Discussed with ED attending Dr. Sabra Heck who agrees to plan to give 10 of Geodon.  Patient on cardiac monitor. CBC unremarkable. CMP with glucose 125, otherwise unremarkable. TSH in process. Plan to attempt and out cath for UA once patient is calm. NT performing bladder scan. Unassigned admission. Patient would benefit from admission given L1 fracture and inability to walk which is new, different from her baseline. This case was discussed with ED supervising physician Dr. Sabra Heck who has seen the patient and agrees with plan to admit. hopsitalsit to follow up on UA. Appreciate assistance.  Spoke with Dr. Marlowe Sax with hospitalist service who agrees to assume  care of patient and bring into the hospital for further evaluation and management.     Final Clinical Impression(s) / ED Diagnoses Final diagnoses:  Fall  Closed compression fracture of body of L1 vertebra [facility])    Rx / DC Orders ED Discharge Orders    None       Lewanda Rife 08/22/20 1934    Noemi Chapel, MD 08/22/20 [phone]

## 2020-08-22 NOTE — H&P (Signed)
History and Physical    Alyssa [name] PXT:[id number] DOB: 08/11/1935 DOA: 08/22/2020  PCP: Ernestene Kiel, MD Patient coming from: Home  Chief Complaint: Back pain  HPI: Alyssa [name] is a 85 y.o. female with medical history significant of anemia, CAD, chronic diastolic CHF, hypertension, hyperlipidemia, hypothyroidism, PVD, insulin-dependent type 2 diabetes, dementia presented to the ED via EMS for evaluation of back pain. No history could be obtained from the patient given her dementia.  No family available at this time.  Per ED PA's conversation with the patient's daughter: "His daughter is at the bedside and gives history.  She states that since patient's fall x2 weeks ago she has been complaining of ongoing back pain.  She has not gotten out of bed much at all which is unusual for her.  Every time she tries to ambulate she will scream out in pain.  Patient has had 2 more falls.  She was not seen in the emergency department afterwards for either of them.  The last 1 was x4 days ago.  With these falls they deny her hitting her head or loss of consciousness.  They state her memory has been progressively worsening with her known dementia, no acute changes there.  Yesterday patient's other daughter noticed that she had painless hematuria.  She has no history of UTI or kidney stones.  Patient has also had decreased p.o. intake and not been compliant with taking her medications.  Patient lives with her husband and daughters check on her daily."  ED Course: No fever or tachycardia.  Labs showing WBC 6.6, hemoglobin 14.0, platelet count 265K.  Sodium 141, potassium 3.9, chloride 108, bicarb 24, BUN 13, creatinine 0.7, glucose 125.  UA and urine culture pending.  Patient did not urinate in the ED.  CT renal stone study showing well distended bladder and no evidence of obstructive uropathy.  CT lumbar spine showing mild L1 superior endplate compression fracture with approximately 30% loss of height  centrally.  Mild buckling of the posterior cortex without significant mass-effect on the spinal canal.  Per radiologist, findings are likely subacute. Patient was given Ativan and Geodon for agitation/combativeness.  Review of Systems:  All systems reviewed and apart from history of presenting illness, are negative.  Past Medical History:  Diagnosis Date  . Anemia   . CAD (coronary artery disease)    a. 2013 NSTEMI s/p DES to LAD. b. 04/03/14: NSTEMI s/p DES to OM1, DES to dRCA.  [name] Chronic diastolic CHF (congestive heart failure) ([place])    a. 2D ECHO 04/02/14 with hypokinesis in the basal inferior and inferoseptal walls. Focal and moderate concentric LVH. Overall LVEF 60-65%. G1DD. Elevated EDLV filling pressures. Mild TR.  . Diverticulosis   . GERD (gastroesophageal reflux disease)   . History of GI bleed    a. 2013: adm for ABL anemia. 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  [name] HLD (hyperlipidemia)   . HTN (hypertension)   . Hypothyroid   . Memory disorder 10/06/2017  . Peripheral vascular disease ([place])   . Refusal of blood transfusions as patient is Jehovah's Witness 09-29-12  . Serrated polyp of colon 2013  . Type II diabetes mellitus ([place])     Past Surgical History:  Procedure Laterality Date  . CESAREAN SECTION  ~ 1961; 1966   plus 3 NVD  . COLONOSCOPY  02/27/2012   Procedure: COLONOSCOPY;  Surgeon: Lear Ng, MD;  Location: [facility] ENDOSCOPY;  Service: Endoscopy;  Laterality: N/A;  . CORONARY ANGIOPLASTY WITH STENT PLACEMENT  05/2011; 04/03/2014  . ESOPHAGOGASTRODUODENOSCOPY  02/26/2012   Procedure: ESOPHAGOGASTRODUODENOSCOPY (EGD);  Surgeon: Lear Ng, MD;  Location: [facility] ENDOSCOPY;  Service: Endoscopy;  Laterality: N/A;  . LEFT HEART CATHETERIZATION WITH CORONARY ANGIOGRAM N/A 06/12/2011   Procedure: LEFT HEART CATHETERIZATION WITH CORONARY ANGIOGRAM;  Surgeon: Josue Hector, MD;  Location: [facility] CATH LAB;  Service: Cardiovascular;  Laterality: N/A;  . LEFT HEART CATHETERIZATION WITH CORONARY ANGIOGRAM N/A 04/03/2014   Procedure: LEFT HEART CATHETERIZATION WITH CORONARY ANGIOGRAM;  Surgeon: Jettie Booze, MD;  Location: [facility] CATH LAB;  Service: Cardiovascular;  Laterality: N/A;  . TONSILLECTOMY    . VAGINAL HYSTERECTOMY       reports that she quit smoking about 50 years ago. Her smoking use included cigarettes. She has a 24.00 pack-year smoking history. She has never used smokeless tobacco. She reports current alcohol use of about 3.0 - 4.0 standard drinks of alcohol per week. She reports that she does not use drugs.  Allergies  Allergen Reactions  . Tetanus Toxoid, Adsorbed Swelling and Rash    Site of injection swells    Family History  Problem Relation Age of Onset  . Arthritis Father   . Heart disease Mother   . Varicose Veins Mother   . Hypertension Other     Prior to Admission medications   Medication Sig Start Date End Date Taking? Authorizing Provider  atorvastatin (LIPITOR) 80 MG tablet TAKE 1 TABLET BY MOUTH ONCE DAILY. Patient taking differently: Take 80 mg by mouth every evening.  12/22/17   Sueanne Margarita, MD  carvedilol (COREG) 3.125 MG tablet TAKE 1 TABLET BY MOUTH TWICE DAILY Patient taking differently: Take 3.125 mg by mouth every evening.  11/17/17   Sueanne Margarita, MD  clopidogrel (PLAVIX) 75 MG tablet TAKE 1 TABLET BY MOUTH ONCE DAILY. Patient taking differently: Take 75 mg by mouth every evening.  11/17/17   Sueanne Margarita, MD  donepezil (ARICEPT) 10 MG tablet Take 10 mg by mouth every evening. 08/17/19   [provider]  escitalopram (LEXAPRO) 20 MG tablet Take 1 tablet (20 mg total) by mouth every evening. 03/09/19   Al Decant, MD  fluticasone [facility]) 50 MCG/ACT nasal spray Place 2 sprays into both nostrils daily. 08/17/19   [provider]  furosemide (LASIX) 20 MG tablet TAKE 1 TABLET BY MOUTH ONCE  DAILY. Patient not taking: Reported on 03/07/2019 12/22/17   Sueanne Margarita, MD  hydrocortisone (CORTEF) 5 MG tablet Take 3 tablets by mouth every evening. 08/17/19   [provider]  ibuprofen (ADVIL,MOTRIN) 200 MG tablet Take 400 mg by mouth every 6 (six) hours as needed for mild pain.    [provider]  LEVEMIR FLEXTOUCH 100 UNIT/ML FlexPen Inject 20 Units into the skin every evening. 08/17/19   [provider]  levothyroxine (SYNTHROID) 100 MCG tablet Take 1 tablet (100 mcg total) by mouth daily at 6 (six) AM. Patient not taking: Reported on 08/26/2019 03/10/19   Al Decant, MD  levothyroxine (SYNTHROID) 125 MCG tablet Take 1 tablet by mouth every evening. 08/17/19   [provider]  losartan (COZAAR) 50 MG tablet Take 50 mg by mouth every evening.  11/08/18   [provider]  metFORMIN (GLUCOPHAGE-XR) 500 MG 24 hr tablet Take 1,000 mg by mouth every evening. 02/28/19   [provider]  methocarbamol (ROBAXIN) 500 MG tablet Take 1  tablet (500 mg total) by mouth 2 (two) times daily as needed for muscle spasms. 08/08/20   Noemi Chapel, MD  naproxen (EC NAPROSYN) 500 MG EC tablet Take 1 tablet (500 mg total) by mouth 2 (two) times daily with a meal. 08/08/20   Noemi Chapel, MD  naproxen sodium (ALEVE) 220 MG tablet Take 220 mg by mouth daily as needed (pain).     [provider]  nitroGLYCERIN (NITROSTAT) 0.4 MG SL tablet Place 0.4 mg under the tongue every 5 (five) minutes as needed for chest pain. Reported on 10/04/2015    [provider]  polyethylene glycol (MIRALAX / GLYCOLAX) packet Take 17 g by mouth daily as needed for mild constipation. Reported on 10/04/2015    [provider]  [place] X 5/16" 0.5 ML MISC  12/31/16   [provider]  ZETIA 10 MG tablet TAKE 1 TABLET BY MOUTH EVERY DAY Patient taking differently: Take 10 mg by mouth every evening.  03/05/15   Sueanne Margarita, MD     Physical Exam: Vitals:   08/22/20 1730 08/22/20 1745 08/22/20 1830 08/22/20 1845  BP: (!) 149/79 (!) 120/100 (!) 121/100   Pulse: 84 89 93 90  Resp:  17 (!) 27 19  Temp:   98 F (36.7 C)   TempSrc:   Oral   SpO2: 97% 100% 96% 97%    Physical Exam Constitutional:      General: She is not in acute distress. HENT:     Head: Normocephalic and atraumatic.     Mouth/Throat:     Mouth: Mucous membranes are moist.  Eyes:     Extraocular Movements: Extraocular movements intact.  Cardiovascular:     Rate and Rhythm: Normal rate and regular rhythm.     Pulses: Normal pulses.  Pulmonary:     Effort: Pulmonary effort is normal. No respiratory distress.     Breath sounds: Normal breath sounds.  Abdominal:     General: Bowel sounds are normal. There is no distension.     Palpations: Abdomen is soft.     Tenderness: There is no abdominal tenderness.  Musculoskeletal:        General: No swelling or tenderness.     Cervical back: Normal range of motion and neck supple.  Skin:    General: Skin is warm and dry.  Neurological:     General: No focal deficit present.     Mental Status: She is alert.     Comments: Moving all extremities on command.  No focal weakness or sensory deficit.     Labs on Admission: I have personally reviewed following labs and imaging studies  CBC: Recent Labs  Lab 08/22/20 1552  WBC 6.6  NEUTROABS 4.3  HGB 14.0  HCT 45.0  MCV 91.3  PLT 99991111   Basic Metabolic Panel: Recent Labs  Lab 08/22/20 1552  NA 141  K 3.9  CL 108  CO2 24  GLUCOSE 125*  BUN 13  CREATININE 0.79  CALCIUM 9.0   GFR: CrCl cannot be calculated (Unknown ideal weight.). Liver Function Tests: Recent Labs  Lab 08/22/20 1552  AST 32  ALT 23  ALKPHOS 95  BILITOT 0.7  PROT 6.6  ALBUMIN 3.6   No results for input(s): LIPASE, AMYLASE in the last 168 hours. No results for input(s): AMMONIA in the last 168 hours. Coagulation Profile: No results for input(s): INR,  PROTIME in the last 168 hours. Cardiac Enzymes: No results for input(s): CKTOTAL,  CKMB, CKMBINDEX, TROPONINI in the last 168 hours. BNP (last 3 results) No results for input(s): PROBNP in the last 8760 hours. HbA1C: No results for input(s): HGBA1C in the last 72 hours. CBG: No results for input(s): GLUCAP in the last 168 hours. Lipid Profile: No results for input(s): CHOL, HDL, LDLCALC, TRIG, CHOLHDL, LDLDIRECT in the last 72 hours. Thyroid Function Tests: No results for input(s): TSH, T4TOTAL, FREET4, T3FREE, THYROIDAB in the last 72 hours. Anemia Panel: No results for input(s): VITAMINB12, FOLATE, FERRITIN, TIBC, IRON, RETICCTPCT in the last 72 hours. Urine analysis:    Component Value Date/Time   COLORURINE YELLOW 03/07/2019 1730   APPEARANCEUR HAZY (A) 03/07/2019 1730   LABSPEC 1.012 03/07/2019 1730   PHURINE 5.0 03/07/2019 1730   GLUCOSEU 50 (A) 03/07/2019 1730   HGBUR SMALL (A) 03/07/2019 1730   BILIRUBINUR NEGATIVE 03/07/2019 1730   KETONESUR NEGATIVE 03/07/2019 1730   PROTEINUR NEGATIVE 03/07/2019 1730   NITRITE NEGATIVE 03/07/2019 1730   LEUKOCYTESUR MODERATE (A) 03/07/2019 1730    Radiological Exams on Admission: CT L-SPINE NO CHARGE  Result Date: 08/22/2020 CLINICAL DATA:  Back pain for 2 weeks after fall in April. EXAM: CT LUMBAR SPINE WITHOUT CONTRAST TECHNIQUE: Multidetector CT imaging of the lumbar spine was performed without intravenous contrast administration. Multiplanar CT image reconstructions were also generated. COMPARISON:  Lumbar radiograph 08/08/2020 FINDINGS: Segmentation: 5 lumbar type vertebrae. Alignment: No listhesis. Vertebrae: Mild L1 superior endplate compression fracture likely subacute. There is approximately 30% loss of height centrally. Mild buckling of the posterior cortex without significant mass effect on the spinal canal. No involvement of the posterior elements. No additional fracture. The remaining vertebral body heights are preserved. No  sacral fracture. Paraspinal and other soft tissues: No significant paraspinal hemorrhage. Aortic and branch atherosclerosis. Abdominal structures assessed on concurrent abdominopelvic CT, reported separately. Disc levels: Mild T12-L1 disc space narrowing and vacuum phenomenon. Disc spaces are preserved. No significant mass effect on the spinal canal related to L1 fracture. There is no canal narrowing. Facet hypertrophy at L5-S1. IMPRESSION: Mild L1 superior endplate compression fracture with approximately 30% loss of height centrally. Mild buckling of the posterior cortex without significant mass effect on the spinal canal. Findings are likely subacute. Electronically Signed   By: Keith Rake M.D.   On: 08/22/2020 16:37   CT Renal Stone Study  Result Date: 08/22/2020 CLINICAL DATA:  Back pain for several weeks following recent fall with hematuria, initial encounter EXAM: CT ABDOMEN AND PELVIS WITHOUT CONTRAST TECHNIQUE: Multidetector CT imaging of the abdomen and pelvis was performed following the standard protocol without IV contrast. COMPARISON:  08/08/2020 FINDINGS: Lower chest: No acute abnormality. Hepatobiliary: Liver is within normal limits. Gallbladder is well distended with dependent gallstones. No ductal dilatation is seen. Pancreas: Unremarkable. No pancreatic ductal dilatation or surrounding inflammatory changes. Spleen: Normal in size without focal abnormality. Adrenals/Urinary Tract: Adrenal glands are within normal limits. Kidneys show vague hypodensities likely representing small cysts. No renal calculi or obstructive changes are noted. The ureters are within normal limits to the level of the urinary bladder. Bladder is well distended. Stomach/Bowel: Scattered diverticular changes noted without evidence of diverticulitis. Scattered fecal material is noted throughout the colon. No obstructive or inflammatory changes are seen. The appendix is within normal limits. No inflammatory changes are  seen. Small bowel and stomach are unremarkable. Vascular/Lymphatic: Aortic atherosclerosis. No enlarged abdominal or pelvic lymph nodes. Reproductive: Status post hysterectomy. No adnexal masses. Other: No abdominal wall hernia or abnormality. No abdominopelvic ascites. Musculoskeletal:  Degenerative changes of lumbar spine are noted. Partial lumbarization of S1 is seen. There is a superior endplate compression deformity at L1 identified which was not seen on the recent plain film examination and may have been related to the recent fall. No significant retropulsion is noted. No other focal bony abnormality is noted. IMPRESSION: L1 compression fracture not present on prior plain film examination and likely related to the recent injury. Diverticular change without diverticulitis. Cholelithiasis without complicating factors. No other focal abnormality is noted. Electronically Signed   By: Inez Catalina M.D.   On: 08/22/2020 16:34    Assessment/Plan Principal Problem:   Lumbar compression fracture (HCC) Active Problems:   Hypothyroid   DM type 2 (diabetes mellitus, type 2) (HCC)   Essential hypertension   CAD (coronary artery disease)   Subacute lumbar compression fracture: Likely secondary to multiple falls at home in the past 2 weeks. CT lumbar spine showing mild L1 superior endplate compression fracture with approximately 30% loss of height centrally.  Mild buckling of the posterior cortex without significant mass-effect on the spinal canal.  Per radiologist, findings are likely subacute. -Pain management, PT/OT eval.  Hematuria: No episodes of gross hematuria in the ED.  CT renal stone study without evidence of kidney stones. -UA pending.  Avoid chemical DVT prophylaxis at this time.   Acute urinary retention: Patient did not urinate in the ED and CT showing well distended bladder. -In-N-Out cath, bladder scans every 4 hours.  May need Foley if urinary retention persists.  Dementia with acute  delirium -Patient was given Ativan and Geodon in the ED for agitation/combativeness.  Follow delirium precautions.  CAD: Stable.  Patient is not endorsing any chest pain. -Resume home meds after pharmacy med rec is done.  Chronic diastolic CHF: Stable.  No signs of volume overload. -Resume home meds after pharmacy med rec is done.  Hypertension -Resume home meds after pharmacy med rec is done.  Hypothyroidism -Resume Synthroid after pharmacy med rec is done.  Insulin-dependent type 2 diabetes -Check A1c.  Sliding scale insulin sensitive ACHS.  Resume home basal insulin after pharmacy med rec is done.  DVT prophylaxis: SCDs Code Status: Full code Family Communication: No family available at this time. Disposition Plan: Status is: Observation  The patient remains OBS appropriate and will d/c before 2 midnights.  Dispo: The patient is from: Home              Anticipated d/c is to: SNF              Patient currently is not medically stable to d/c.   Difficult to place patient No  Level of care: Level of care: Med-Surg   The medical decision making on this patient was of high complexity and the patient is at high risk for clinical deterioration, therefore this is a level 3 visit.  Shela Leff MD [facility]  If 7PM-7AM, please contact night-coverage [email or web address]  08/22/2020, 7:47 PM

## 2020-08-22 NOTE — ED Notes (Signed)
Attempted to call report and was advised to call back it had not been assigned yet

## 2020-08-22 NOTE — ED Notes (Signed)
Received verbal report from Alberto at this time 

## 2020-08-22 NOTE — ED Triage Notes (Signed)
Patient BIB [facility] EMS for back pain that started two weeks ago after falling in April. Patient lives at home with husband, baseline orientation to self and situation only due to dementia. EMS vitals BP 152/84 HR 78 SpO2 96% on room air CBG 172

## 2020-08-22 NOTE — ED Notes (Signed)
Patient continues to be combative and disoriented, shouting incoherently at daughter and staff about picking up cookies on floor, calling daughter by someone else's name, and demanding to be allowed to walk [place] to another [place].

## 2020-08-22 NOTE — ED Notes (Signed)
Pt is awake and talking to people that is not there in the room at this time.

## 2020-08-22 NOTE — ED Notes (Signed)
Patient shouting at staff, attempting to punch and bite staff during blood draw after staff had obtained permission from patient to draw her blood.

## 2020-08-22 NOTE — ED Notes (Signed)
Patient becoming combative and uncooperative. Daughter states patient has frequent similar episodes at home since being diagnosed with dementia and has a PRN medication she take at home but is unsure of the name and dose.

## 2020-08-23 ENCOUNTER — Other Ambulatory Visit: Payer: Self-pay

## 2020-08-23 DIAGNOSIS — Z794 Long term (current) use of insulin: Secondary | ICD-10-CM | POA: Diagnosis not present

## 2020-08-23 DIAGNOSIS — I252 Old myocardial infarction: Secondary | ICD-10-CM | POA: Diagnosis not present

## 2020-08-23 DIAGNOSIS — S32010A Wedge compression fracture of first lumbar vertebra, initial encounter for closed fracture: Secondary | ICD-10-CM

## 2020-08-23 DIAGNOSIS — F0390 Unspecified dementia without behavioral disturbance: Secondary | ICD-10-CM | POA: Diagnosis present

## 2020-08-23 DIAGNOSIS — Y92009 Unspecified place in unspecified non-institutional (private) residence as the place of occurrence of the external cause: Secondary | ICD-10-CM | POA: Diagnosis not present

## 2020-08-23 DIAGNOSIS — F05 Delirium due to known physiological condition: Secondary | ICD-10-CM | POA: Diagnosis present

## 2020-08-23 DIAGNOSIS — I10 Essential (primary) hypertension: Secondary | ICD-10-CM

## 2020-08-23 DIAGNOSIS — Z79899 Other long term (current) drug therapy: Secondary | ICD-10-CM | POA: Diagnosis not present

## 2020-08-23 DIAGNOSIS — E2740 Unspecified adrenocortical insufficiency: Secondary | ICD-10-CM | POA: Diagnosis present

## 2020-08-23 DIAGNOSIS — M4856XA Collapsed vertebra, not elsewhere classified, lumbar region, initial encounter for fracture: Secondary | ICD-10-CM | POA: Diagnosis present

## 2020-08-23 DIAGNOSIS — I5032 Chronic diastolic (congestive) heart failure: Secondary | ICD-10-CM | POA: Diagnosis present

## 2020-08-23 DIAGNOSIS — Z8249 Family history of ischemic heart disease and other diseases of the circulatory system: Secondary | ICD-10-CM | POA: Diagnosis not present

## 2020-08-23 DIAGNOSIS — E1151 Type 2 diabetes mellitus with diabetic peripheral angiopathy without gangrene: Secondary | ICD-10-CM | POA: Diagnosis present

## 2020-08-23 DIAGNOSIS — E1159 Type 2 diabetes mellitus with other circulatory complications: Secondary | ICD-10-CM

## 2020-08-23 DIAGNOSIS — Z887 Allergy status to serum and vaccine status: Secondary | ICD-10-CM | POA: Diagnosis not present

## 2020-08-23 DIAGNOSIS — W1830XA Fall on same level, unspecified, initial encounter: Secondary | ICD-10-CM | POA: Diagnosis present

## 2020-08-23 DIAGNOSIS — Z20822 Contact with and (suspected) exposure to covid-19: Secondary | ICD-10-CM | POA: Diagnosis present

## 2020-08-23 DIAGNOSIS — I2510 Atherosclerotic heart disease of native coronary artery without angina pectoris: Secondary | ICD-10-CM | POA: Diagnosis present

## 2020-08-23 DIAGNOSIS — Z7984 Long term (current) use of oral hypoglycemic drugs: Secondary | ICD-10-CM | POA: Diagnosis not present

## 2020-08-23 DIAGNOSIS — Z87891 Personal history of nicotine dependence: Secondary | ICD-10-CM | POA: Diagnosis not present

## 2020-08-23 DIAGNOSIS — R339 Retention of urine, unspecified: Secondary | ICD-10-CM | POA: Diagnosis present

## 2020-08-23 DIAGNOSIS — Z79890 Hormone replacement therapy (postmenopausal): Secondary | ICD-10-CM | POA: Diagnosis not present

## 2020-08-23 DIAGNOSIS — E785 Hyperlipidemia, unspecified: Secondary | ICD-10-CM | POA: Diagnosis present

## 2020-08-23 DIAGNOSIS — S32018A Other fracture of first lumbar vertebra, initial encounter for closed fracture: Secondary | ICD-10-CM | POA: Diagnosis present

## 2020-08-23 DIAGNOSIS — Z8261 Family history of arthritis: Secondary | ICD-10-CM | POA: Diagnosis not present

## 2020-08-23 DIAGNOSIS — I110 Hypertensive heart disease with heart failure: Secondary | ICD-10-CM | POA: Diagnosis present

## 2020-08-23 DIAGNOSIS — E039 Hypothyroidism, unspecified: Secondary | ICD-10-CM

## 2020-08-23 DIAGNOSIS — R296 Repeated falls: Secondary | ICD-10-CM | POA: Diagnosis present

## 2020-08-23 DIAGNOSIS — Z955 Presence of coronary angioplasty implant and graft: Secondary | ICD-10-CM | POA: Diagnosis not present

## 2020-08-23 DIAGNOSIS — E1165 Type 2 diabetes mellitus with hyperglycemia: Secondary | ICD-10-CM | POA: Diagnosis present

## 2020-08-23 LAB — HEMOGLOBIN A1C
Hgb A1c MFr Bld: 9.3 % — ABNORMAL HIGH (ref 4.8–5.6)
Mean Plasma Glucose: 220.21 mg/dL

## 2020-08-23 LAB — URINALYSIS, ROUTINE W REFLEX MICROSCOPIC
Bilirubin Urine: NEGATIVE
Glucose, UA: NEGATIVE mg/dL
Hgb urine dipstick: NEGATIVE
Ketones, ur: NEGATIVE mg/dL
Leukocytes,Ua: NEGATIVE
Nitrite: NEGATIVE
Protein, ur: NEGATIVE mg/dL
Specific Gravity, Urine: 1.013 (ref 1.005–1.030)
pH: 5 (ref 5.0–8.0)

## 2020-08-23 LAB — GLUCOSE, CAPILLARY
Glucose-Capillary: 115 mg/dL — ABNORMAL HIGH (ref 70–99)
Glucose-Capillary: 119 mg/dL — ABNORMAL HIGH (ref 70–99)
Glucose-Capillary: 144 mg/dL — ABNORMAL HIGH (ref 70–99)
Glucose-Capillary: 142 mg/dL — ABNORMAL HIGH (ref 70–99)
Glucose-Capillary: 148 mg/dL — ABNORMAL HIGH (ref 70–99)

## 2020-08-23 LAB — SARS CORONAVIRUS 2 (TAT 6-24 HRS): SARS Coronavirus 2: NEGATIVE

## 2020-08-23 MED ORDER — FLUTICASONE PROPIONATE 50 MCG/ACT NA SUSP
2.0000 | Freq: Every day | NASAL | Status: DC | PRN
  Filled 2020-08-23: qty 16

## 2020-08-23 MED ORDER — LEVOTHYROXINE SODIUM 25 MCG PO TABS
125.0000 ug | ORAL_TABLET | Freq: Every day | ORAL | Status: DC
  Administered 2020-08-26 – 2020-08-28 (×3): 125 ug via ORAL
  Filled 2020-08-23 (×4): qty 1

## 2020-08-23 MED ORDER — HYDROCORTISONE 5 MG PO TABS
15.0000 mg | ORAL_TABLET | Freq: Every evening | ORAL | Status: DC
  Administered 2020-08-23 – 2020-08-28 (×6): 15 mg via ORAL
  Filled 2020-08-23 (×6): qty 1

## 2020-08-23 MED ORDER — ESCITALOPRAM OXALATE 10 MG PO TABS
20.0000 mg | ORAL_TABLET | Freq: Every evening | ORAL | Status: DC
  Administered 2020-08-23 – 2020-08-28 (×6): 20 mg via ORAL
  Filled 2020-08-23 (×6): qty 2

## 2020-08-23 MED ORDER — LOSARTAN POTASSIUM 50 MG PO TABS
50.0000 mg | ORAL_TABLET | Freq: Every evening | ORAL | Status: DC
  Administered 2020-08-23 – 2020-08-28 (×6): 50 mg via ORAL
  Filled 2020-08-23 (×6): qty 1

## 2020-08-23 MED ORDER — LORAZEPAM 2 MG/ML IJ SOLN
1.0000 mg | Freq: Once | INTRAMUSCULAR | Status: AC
  Administered 2020-08-23: 1 mg via INTRAVENOUS
  Filled 2020-08-23: qty 1

## 2020-08-23 MED ORDER — ACETAMINOPHEN 500 MG PO TABS
1000.0000 mg | ORAL_TABLET | Freq: Three times a day (TID) | ORAL | Status: AC
  Administered 2020-08-23 – 2020-08-27 (×15): 1000 mg via ORAL
  Filled 2020-08-23 (×15): qty 2

## 2020-08-23 MED ORDER — POLYETHYLENE GLYCOL 3350 17 G PO PACK
17.0000 g | PACK | Freq: Every day | ORAL | Status: DC
  Administered 2020-08-24 – 2020-08-28 (×3): 17 g via ORAL
  Filled 2020-08-23 (×3): qty 1

## 2020-08-23 MED ORDER — EZETIMIBE 10 MG PO TABS
10.0000 mg | ORAL_TABLET | Freq: Every evening | ORAL | Status: DC
  Administered 2020-08-23 – 2020-08-28 (×6): 10 mg via ORAL
  Filled 2020-08-23 (×6): qty 1

## 2020-08-23 MED ORDER — ATORVASTATIN CALCIUM 80 MG PO TABS
80.0000 mg | ORAL_TABLET | Freq: Every evening | ORAL | Status: DC
  Administered 2020-08-23 – 2020-08-28 (×6): 80 mg via ORAL
  Filled 2020-08-23 (×6): qty 1

## 2020-08-23 MED ORDER — DONEPEZIL HCL 10 MG PO TABS
10.0000 mg | ORAL_TABLET | Freq: Every evening | ORAL | Status: DC
  Administered 2020-08-23 – 2020-08-28 (×6): 10 mg via ORAL
  Filled 2020-08-23 (×6): qty 1

## 2020-08-23 MED ORDER — CLOPIDOGREL BISULFATE 75 MG PO TABS
75.0000 mg | ORAL_TABLET | Freq: Every evening | ORAL | Status: DC
  Administered 2020-08-23 – 2020-08-28 (×6): 75 mg via ORAL
  Filled 2020-08-23 (×6): qty 1

## 2020-08-23 MED ORDER — CARVEDILOL 3.125 MG PO TABS
3.1250 mg | ORAL_TABLET | Freq: Two times a day (BID) | ORAL | Status: DC
  Administered 2020-08-23 – 2020-08-28 (×10): 3.125 mg via ORAL
  Filled 2020-08-23 (×11): qty 1

## 2020-08-23 MED ORDER — DIVALPROEX SODIUM ER 250 MG PO TB24
250.0000 mg | ORAL_TABLET | Freq: Every evening | ORAL | Status: DC
  Administered 2020-08-23 – 2020-08-28 (×6): 250 mg via ORAL
  Filled 2020-08-23 (×6): qty 1

## 2020-08-23 MED ORDER — INSULIN DETEMIR 100 UNIT/ML ~~LOC~~ SOLN
10.0000 [IU] | Freq: Every evening | SUBCUTANEOUS | Status: DC
  Administered 2020-08-24 – 2020-08-28 (×5): 10 [IU] via SUBCUTANEOUS
  Filled 2020-08-23 (×7): qty 0.1

## 2020-08-23 MED ORDER — ACETAMINOPHEN 325 MG PO TABS: 650.0000 mg | ORAL_TABLET | Freq: Four times a day (QID) | ORAL | Status: DC | PRN

## 2020-08-23 MED ORDER — RIVAROXABAN 10 MG PO TABS
10.0000 mg | ORAL_TABLET | Freq: Every day | ORAL | Status: DC
  Administered 2020-08-23 – 2020-08-28 (×6): 10 mg via ORAL
  Filled 2020-08-23 (×6): qty 1

## 2020-08-23 NOTE — TOC CAGE-AID Note (Signed)
Transition of Care [facility]) - CAGE-AID Screening   Patient Details  Name: Alyssa [name] MRN: [id number] Date of Birth: [birth year]  Elvina Sidle, RN Trauma Response Nurse Phone Number: 08/23/2020, 9:45 AM   Clinical Narrative:    Pt has slurred speech, will answer questions- admits to drinking 2-3 drinks a day, but is confused as to place/event-    CAGE-AID Screening:    Have You Ever Felt You Ought to Cut Down on Your Drinking or Drug Use?: No Have People Annoyed You By Critizing Your Drinking Or Drug Use?: No Have You Felt Bad Or Guilty About Your Drinking Or Drug Use?: No Have You Ever Had a Drink or Used Drugs First Thing In The Morning to Steady Your Nerves or to Get Rid of a Hangover?: No CAGE-AID Score: 0  Substance Abuse Education Offered: No (pt admits to drinking 2-3 drinks a day- denies wanting to stop, does have rambling speech- confused to place/event)

## 2020-08-23 NOTE — Plan of Care (Signed)
Patient has been calm and cooperative for most part of the shift. Did not eat or drink anything for breakfast or lunch but was able to eat dinner with her daughters. Bladder Scan patient twice this shift. Last bladders scan 251 ml. Next Bladder Scan at 2000. Appears comfortable, denies urge to void. Will continue to monitor. Problem: Education: Goal: Knowledge of General Education information will improve Description: Including pain rating scale, medication(s)/side effects and non-pharmacologic comfort measures Outcome: Progressing   Problem: Health Behavior/Discharge Planning: Goal: Ability to manage health-related needs will improve Outcome: Progressing

## 2020-08-23 NOTE — Progress Notes (Signed)
Occupational Therapy Evaluation Patient Details Name: Alyssa [name] MRN: [id number] DOB: 11-04-1935 Today's Date: 08/23/2020    History of Present Illness The pt is an 85 yo female presenting 5/11 with c/o back pain that has been ongoing for 2 weeks since a fall in April. Upon work-up, pt found to have subacute L1 compression fx, and is presenting with acute delirium. PMH includes: CAD, NSTEMI x2, CHF, HLD, HTN, dementia, PVD, DM II, and adrenal insufficiency.   Clinical Impression   Alyssa [name] was evaluated for the above. Upon arrival she was very lethargic and only oriented to person. This therapist contacted pt's daughter to obtain PTA: prior to recent fall April 27th pt was mod I with ADLs and mobility, since her fall 4/27 she has required max/total A for all ADLs and mobility. Pt required max +2 for all bed mobility with extreme posterior lean upon transition from side lying to sitting. Pt with increased pain in her "hips" and "hair" while sitting, however seemingly with pain in her back. Pt benefits from continued services acutely to progress function in ADLs and mobility. Recommend d/c to SNF with 24/7 supervision.     Follow Up Recommendations  SNF;Supervision/Assistance - 24 hour    Equipment Recommendations  None recommended by OT       Precautions / Restrictions Precautions Precautions: Back;Fall Precaution Comments: back for comfort, pt with x2 recent falls at home Restrictions Weight Bearing Restrictions: No      Mobility Bed Mobility Overal bed mobility: Needs Assistance Bed Mobility: Rolling;Sidelying to Sit;Sit to Supine Rolling: Max assist;+2 for physical assistance Sidelying to sit: Max assist;+2 for physical assistance   Sit to supine: Total assist;+2 for physical assistance   General bed mobility comments: pt with extreme posterior lean upon transition of supine to sit    Transfers                 General transfer comment: deffered this session for  safety    Balance Overall balance assessment: Needs assistance Sitting-balance support: No upper extremity supported Sitting balance-[name]: Zero Sitting balance - Comments: maxA posterior support and support at bilateral knees to maintain static sitting EOB Postural control: Posterior lean           ADL either performed or assessed with clinical judgement   ADL Overall ADL's : Needs assistance/impaired Eating/Feeding: Set up;Bed level   Grooming: Maximal assistance;Bed level   Upper Body Bathing: Total assistance;Bed level   Lower Body Bathing: Total assistance;Bed level   Upper Body Dressing : Total assistance;Bed level   Lower Body Dressing: Total assistance;Bed level   Toilet Transfer: Total assistance   Toileting- Clothing Manipulation and Hygiene: Total assistance;Bed level       Functional mobility during ADLs: Total assistance;+2 for physical assistance;+2 for safety/equipment General ADL Comments: pt unable to participate functionally due to cognition and lethargy      Pertinent Vitals/Pain Pain Assessment: Faces Faces Pain Scale: Hurts whole lot Pain Location: back with bed mobility/transition Pain Descriptors / Indicators: Discomfort;Grimacing;Moaning Pain Intervention(s): Limited activity within patient's tolerance     Hand Dominance Right   Extremity/Trunk Assessment Upper Extremity Assessment Upper Extremity Assessment: Generalized weakness;Difficult to assess due to impaired cognition   Lower Extremity Assessment Lower Extremity Assessment: Defer to PT evaluation   Cervical / Trunk Assessment Cervical / Trunk Assessment: Normal   Communication Communication Communication: Receptive difficulties;Expressive difficulties   Cognition Arousal/Alertness: Lethargic Behavior During Therapy: Flat affect Overall Cognitive Status: History of cognitive impairments - at  baseline Area of Impairment: Orientation;Attention;Memory;Following  commands;Safety/judgement;Awareness;Problem solving                 Orientation Level: Disoriented to;Place;Time;Situation Current Attention Level: Focused Memory: Decreased recall of precautions;Decreased short-term memory Following Commands: Follows one step commands inconsistently;Follows one step commands with increased time Safety/Judgement: Decreased awareness of safety;Decreased awareness of deficits Awareness: Intellectual Problem Solving: Slow processing;Decreased initiation;Difficulty sequencing;Requires verbal cues;Requires tactile cues General Comments: pt with history of dementia, oriented only to person today. attempting to answer questions, but often with incoherent or nonsensical speech. Pt inconsistently able to follow cues, no change in arousal with changes in environment or positioning. pt not opening eyes through session   General Comments  pt lethargic, possibly due to medication            Home Living Family/patient expects to be discharged to:: Private residence Living Arrangements: Spouse/significant other Available Help at Discharge: Family;Available 24 hours/day Type of Home: House Home Access: Ramped entrance     Home Layout: One level     Bathroom Shower/Tub: Walk-in shower;Door   [employer] Toilet: Standard Bathroom Accessibility: Yes How Accessible: Accessible via walker Home Equipment: Bedside commode;Wheelchair - [employer] - 4 wheels   Additional Comments: history received from pt's daughter via phone call      Prior Functioning/Environment Level of Independence: Needs assistance  Gait / Transfers Assistance Needed: Pts daughter reported since her fall April 27th pt has needed a lot of assistance at home with transfers to wc and on/off commode ADL's / Homemaking Assistance Needed: max/total A per daughter report   Comments: Pt's daughter reported that pt was mod I will all ADLs prior to recent fall April 27th, however since that fall  she has been in a lot of pain and has required max/total A for all ADLs and mobility                OT Goals(Current goals can be found in the care plan section) Acute Rehab OT Goals Patient Stated Goal: to lay back down OT Goal Formulation: With patient Time For Goal Achievement: 09/06/20 Potential to Achieve Goals: Fair ADL Goals Pt Will Perform Grooming: with set-up;bed level Pt Will Perform Upper Body Dressing: with set-up;bed level Pt Will Transfer to Toilet: with min assist;stand pivot transfer;bedside commode  OT Frequency: Min 2X/week   Barriers to D/C:            Co-evaluation PT/OT/SLP Co-Evaluation/Treatment: Yes Reason for Co-Treatment: Necessary to address cognition/behavior during functional activity;For patient/therapist safety;To address functional/ADL transfers;Complexity of the patient's impairments (multi-system involvement) PT goals addressed during session: Mobility/safety with mobility;Balance OT goals addressed during session: ADL's and self-care;Other (comment);Strengthening/ROM (functional mobility)      AM-PAC OT "6 Clicks" Daily Activity     Outcome Measure Help from another person eating meals?: A Little Help from another person taking care of personal grooming?: A Lot Help from another person toileting, which includes using toliet, bedpan, or urinal?: Total Help from another person bathing (including washing, rinsing, drying)?: Total Help from another person to put on and taking off regular upper body clothing?: Total Help from another person to put on and taking off regular lower body clothing?: Total 6 Click Score: 9   End of Session    Activity Tolerance:   Patient left:    OT Visit Diagnosis: Unsteadiness on feet (R26.81);Other abnormalities of gait and mobility (R26.89);Repeated falls (R29.6);Muscle weakness (generalized) (M62.81);History of falling (Z91.81);Pain Pain - Right/Left:  (back) Pain - part of body:  (  back)                 Time: 2585-2778 OT Time Calculation (min): 23 min Charges:  OT General Charges $OT Visit: 1 Visit OT Evaluation $OT Eval Moderate Complexity: 1 Mod    Grant Henkes A Nabor Thomann 08/23/2020, 11:04 AM

## 2020-08-23 NOTE — Progress Notes (Signed)
Paged [facility] on call to notify them of pt trying to get out of bed and rip of tele leads. She is extremely agitated and restless at this time.

## 2020-08-23 NOTE — Progress Notes (Signed)
Pt arrived to 5N9 from ED via stretcher. Pt is only alert to self at this time. 2 RN skin assessment completed with Myra, RN. Pt does not complain about pain at this time. Pt placed on tele. Will continue to monitor.

## 2020-08-23 NOTE — Evaluation (Signed)
Physical Therapy Evaluation Patient Details Name: Alyssa [name] MRN: [id number] DOB: 08/18/1935 Today's Date: 08/23/2020   History of Present Illness  The pt is an 85 yo female presenting 5/11 with c/o back pain that has been ongoing for 2 weeks since a fall in April. Upon work-up, pt found to have subacute L1 compression fx, and is presenting with acute delirium. PMH includes: CAD, NSTEMI x2, CHF, HLD, HTN, dementia, PVD, DM II, and adrenal insufficiency.    Clinical Impression  Pt in bed upon arrival of PT, agreeable to evaluation at this time. The pt was unable to provide information at eval about mobility prior to admission, but per chart, the pt has had multiple recent falls resulting in increased pain and therefore increased assist needed for all mobility and ADLs. The pt now presents with limitations in functional mobility, strength, ROM, activity tolerance, and stability due to above dx and resulting pain, and will continue to benefit from skilled PT to address these deficits. The pt remained lethargic at this time despite attempts to change position in bed and with transition to sitting EOB. The pt's reports increased pain in her back with any bed mobility and with sitting EOB, and required maxA of 2 to complete movements and to maintain static sitting position at EOB. The pt eventually relaxed in sitting position at EOB, but did not open eyes or increase engagement in session. The pt demos significant limitations in strength, stability, and activity tolerance that are compounded by cognitive deficits and delirium at this time, will benefit from continued rehab acutely and following d/c to improve safety with mobility and learn pain-management strategies for mobility.      Follow Up Recommendations SNF;Supervision/Assistance - 24 hour    Equipment Recommendations  Wheelchair (measurements PT);Wheelchair cushion (measurements PT) (hoyer lift (to return home))    Recommendations for Other  Services       Precautions / Restrictions Precautions Precautions: Back;Fall Precaution Comments: back for comfort, pt with x2 recent falls at home Restrictions Weight Bearing Restrictions: No      Mobility  Bed Mobility Overal bed mobility: Needs Assistance Bed Mobility: Rolling;Sidelying to Sit;Sit to Supine Rolling: Max assist;+2 for physical assistance Sidelying to sit: Max assist;+2 for physical assistance   Sit to supine: Total assist;+2 for physical assistance   General bed mobility comments: pt given hand-over-hand and verbal cues for sequencing of log roll, required maxA to complete all mobility due to onset of pain and resulting internal distraction with mobility. pt with full trunk and hip extension with initial transition to sitting EOB, totalA to block knees and prevent pt from sliding off EOB. helicopter transfer used to return to supine for pt comfort    Transfers                 General transfer comment: deferred due to lack of command following and increased pt agitation with onset of pain at this time        Balance Overall balance assessment: Needs assistance Sitting-balance support: No upper extremity supported Sitting balance-[name]: Zero Sitting balance - Comments: maxA posterior support and support at bilateral knees to maintain static sitting EOB Postural control: Posterior lean                                   Pertinent Vitals/Pain Pain Assessment: Faces Faces Pain Scale: Hurts whole lot Pain Location: back with bed mobility/transition Pain Descriptors /  Indicators: Discomfort;Grimacing;Moaning (yelling) Pain Intervention(s): Limited activity within patient's tolerance;Monitored during session;Repositioned    Home Living Family/patient expects to be discharged to:: Private residence Living Arrangements: Spouse/significant other (daughters check on her daily) Available Help at Discharge: Family;Available 24  hours/day             Additional Comments: pt unable to provide reliable history due to confusion. per chart, pt is from home where she lives with her husband, and has daughters check on her daily    Prior Function Level of Independence: Needs assistance         Comments: pt unreliable historian. per chart, pt has had increased pain and difficulty with bed mobility and gait in last 2 weeks following fall. limited by pain, requiring assist for all mobility.     Hand Dominance   Dominant Hand: Right    Extremity/Trunk Assessment   Upper Extremity Assessment Upper Extremity Assessment: Defer to OT evaluation    Lower Extremity Assessment Lower Extremity Assessment: Generalized weakness;Difficult to assess due to impaired cognition (pt able to move BLE at times to commands, unable to perform MMT)    Cervical / Trunk Assessment Cervical / Trunk Assessment: Normal  Communication   Communication: Receptive difficulties (mumbled speech, difficult to understand)  Cognition Arousal/Alertness: Lethargic Behavior During Therapy: Flat affect Overall Cognitive Status: History of cognitive impairments - at baseline Area of Impairment: Orientation;Attention;Memory;Following commands;Safety/judgement;Awareness;Problem solving                 Orientation Level: Disoriented to;Place;Time;Situation Current Attention Level: Focused Memory: Decreased recall of precautions;Decreased short-term memory Following Commands: Follows one step commands inconsistently;Follows one step commands with increased time Safety/Judgement: Decreased awareness of safety;Decreased awareness of deficits Awareness: Intellectual Problem Solving: Slow processing;Decreased initiation;Difficulty sequencing;Requires verbal cues;Requires tactile cues General Comments: pt with history of dementia, oriented only to person today. attempting to answer questions, but often with incoherent or nonsensical speech. Pt  inconsistently able to follow cues, no change in arousal with changes in environment or positioning. pt not opening eyes through session      General Comments General comments (skin integrity, edema, etc.): VSS on RA, pt lethargy could be related to meds given for agitation overnight.    Exercises     Assessment/Plan    PT Assessment Patient needs continued PT services  PT Problem List Decreased strength;Decreased range of motion;Decreased activity tolerance;Decreased balance;Decreased mobility;Decreased cognition;Decreased knowledge of use of DME;Decreased safety awareness;Decreased knowledge of precautions;Pain       PT Treatment Interventions DME instruction;Gait training;Stair training;Functional mobility training;Therapeutic activities;Therapeutic exercise;Balance training;Cognitive remediation;Patient/family education    PT Goals (Current goals can be found in the Care Plan section)  Acute Rehab PT Goals Patient Stated Goal: to lay back down PT Goal Formulation: With patient Time For Goal Achievement: 09/06/20 Potential to Achieve Goals: Fair    Frequency Min 3X/week   Barriers to discharge Decreased caregiver support pt currently requries maxA of 2    Co-evaluation PT/OT/SLP Co-Evaluation/Treatment: Yes Reason for Co-Treatment: Necessary to address cognition/behavior during functional activity;For patient/therapist safety;To address functional/ADL transfers PT goals addressed during session: Mobility/safety with mobility;Balance         AM-PAC PT "6 Clicks" Mobility  Outcome Measure Help needed turning from your back to your side while in a flat bed without using bedrails?: A Lot Help needed moving from lying on your back to sitting on the side of a flat bed without using bedrails?: Total Help needed moving to and from a bed to a chair (  including a wheelchair)?: Total Help needed standing up from a chair using your arms (e.g., wheelchair or bedside chair)?:  Total Help needed to walk in hospital room?: Total Help needed climbing 3-5 steps with a railing? : Total 6 Click Score: 7    End of Session   Activity Tolerance: Patient limited by lethargy;Patient limited by pain Patient left: in bed;with call bell/phone within reach;with bed alarm set Nurse Communication: Mobility status PT Visit Diagnosis: Other abnormalities of gait and mobility (R26.89);Muscle weakness (generalized) (M62.81);Difficulty in walking, not elsewhere classified (R26.2);Pain Pain - part of body:  (back)    Time: 1950-9326 PT Time Calculation (min) (ACUTE ONLY): 21 min   Charges:   PT Evaluation $PT Eval Low Complexity: 1 Low          Karma Ganja, PT, DPT   Acute Rehabilitation Department Pager #: [phone]  Otho Bellows 08/23/2020, 10:22 AM

## 2020-08-23 NOTE — Consult Note (Signed)
Reason for Consult: L1 compression fracture. Referring Physician: Dr. Forrestine Him is an 85 y.o. female.  HPI: Patient is an 85 year old individual who has a history of dementia and significant coronary artery disease hypertension congestive heart failure diabetes and adrenal insufficiency who has been increasing in the number of falls she has been experiencing lately.  Recent work-up demonstrates presence of an L1 compression fracture of the superior endplate with minimal loss of height and no retropulsed bone.  Patient apparently had been complaining of significant back pain but at the current time on the current exam, she is denying any significant difficulties with back pain and rolls onto her side and sits up in the bed with with minimal discomfort.  Patient's daughter Lenna Sciara is present during this time.  I discussed the nature of the fracture with her and noted that if the pain was severe and intractable 1 could consider an L1 kyphoplasty.  Given her current level of minimal discomfort I would be hard but to suggest that for her.  Past Medical History:  Diagnosis Date  . Anemia   . CAD (coronary artery disease)    a. 2013 NSTEMI s/p DES to LAD. b. 04/03/14: NSTEMI s/p DES to OM1, DES to dRCA.  [name] Chronic diastolic CHF (congestive heart failure) ([place])    a. 2D ECHO 04/02/14 with hypokinesis in the basal inferior and inferoseptal walls. Focal and moderate concentric LVH. Overall LVEF 60-65%. G1DD. Elevated EDLV filling pressures. Mild TR.  . Diverticulosis   . GERD (gastroesophageal reflux disease)   . History of GI bleed    a. 2013: adm for ABL anemia. 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  [name] HLD (hyperlipidemia)   . HTN (hypertension)   . Hypothyroid   . Memory disorder 10/06/2017  . Peripheral vascular disease ([place])   . Refusal of blood transfusions as patient is Jehovah's  Witness 09-29-12  . Serrated polyp of colon 2013  . Type II diabetes mellitus ([place])     Past Surgical History:  Procedure Laterality Date  . CESAREAN SECTION  ~ 1961; 1966   plus 3 NVD  . COLONOSCOPY  02/27/2012   Procedure: COLONOSCOPY;  Surgeon: Lear Ng, MD;  Location: [facility] ENDOSCOPY;  Service: Endoscopy;  Laterality: N/A;  . CORONARY ANGIOPLASTY WITH STENT PLACEMENT  05/2011; 04/03/2014  . ESOPHAGOGASTRODUODENOSCOPY  02/26/2012   Procedure: ESOPHAGOGASTRODUODENOSCOPY (EGD);  Surgeon: Lear Ng, MD;  Location: [facility] ENDOSCOPY;  Service: Endoscopy;  Laterality: N/A;  . LEFT HEART CATHETERIZATION WITH CORONARY ANGIOGRAM N/A 06/12/2011   Procedure: LEFT HEART CATHETERIZATION WITH CORONARY ANGIOGRAM;  Surgeon: Josue Hector, MD;  Location[facility] C[facility] LAB;  Service: Cardiovascular;  Laterality: N/A;  . LEFT HEART CATHETERIZATION WITH CORONARY ANGIOGRAM N/A 04/03/2014   Procedure: LEFT HEART CATHETERIZATION WITH CORONARY ANGIOGRAM;  Surgeon: Jettie Booze, MD;  Location: [facility] CATH LAB;  Service: Cardiovascular;  Laterality: N/A;  . TONSILLECTOMY    . VAGINAL HYSTERECTOMY      Family History  Problem Relation Age of Onset  . Arthritis Father   . Heart disease Mother   . Varicose Veins Mother   . Hypertension Other     Social History:  reports that she quit smoking about 50 years ago. Her smoking use included cigarettes. She has a 24.00 pack-year smoking history. She has never used smokeless tobacco. She reports current alcohol use of about 3.0 - 4.0 standard drinks  of alcohol per week. She reports that she does not use drugs.  Allergies:  Allergies  Allergen Reactions  . Tetanus Toxoid, Adsorbed Swelling and Rash    Site of injection swells    Medications: I have reviewed the patient's current medications.  Results for orders placed or performed during the hospital encounter of 08/22/20 (from the past 48 hour(s))  Comprehensive metabolic panel     Status: Abnormal    Collection Time: 08/22/20  3:52 PM  Result Value Ref Range   Sodium 141 135 - 145 mmol/L   Potassium 3.9 3.5 - 5.1 mmol/L    Comment: SLIGHT HEMOLYSIS   Chloride 108 98 - 111 mmol/L   CO2 24 22 - 32 mmol/L   Glucose, Bld 125 (H) 70 - 99 mg/dL    Comment: Glucose reference range applies only to samples taken after fasting for at least 8 hours.   BUN 13 8 - 23 mg/dL   Creatinine, Ser 0.79 0.44 - 1.00 mg/dL   Calcium 9.0 8.9 - 10.3 mg/dL   Total Protein 6.6 6.5 - 8.1 g/dL   Albumin 3.6 3.5 - 5.0 g/dL   AST 32 15 - 41 U/L   ALT 23 0 - 44 U/L   Alkaline Phosphatase 95 38 - 126 U/L   Total Bilirubin 0.7 0.3 - 1.2 mg/dL   GFR, Estimated >60 >60 mL/min    Comment: (NOTE) Calculated using the CKD-EPI Creatinine Equation (2021)    Anion gap 9 5 - 15    Comment: Performed at [facility] [address], [place], [place] [address]  CBC with Differential     Status: None   Collection Time: 08/22/20  3:52 PM  Result Value Ref Range   WBC 6.6 4.0 - 10.5 K/uL   RBC 4.93 3.87 - 5.11 MIL/uL   Hemoglobin 14.0 12.0 - 15.0 g/dL   HCT 45.0 36.0 - 46.0 %   MCV 91.3 80.0 - 100.0 fL   MCH 28.4 26.0 - 34.0 pg   MCHC 31.1 30.0 - 36.0 g/dL   RDW 15.3 11.5 - 15.5 %   Platelets 265 150 - 400 K/uL   nRBC 0.0 0.0 - 0.2 %   Neutrophils Relative % 64 %   Neutro Abs 4.3 1.7 - 7.7 K/uL   Lymphocytes Relative 25 %   Lymphs Abs 1.7 0.7 - 4.0 K/uL   Monocytes Relative 7 %   Monocytes Absolute 0.4 0.1 - 1.0 K/uL   Eosinophils Relative 3 %   Eosinophils Absolute 0.2 0.0 - 0.5 K/uL   Basophils Relative 1 %   Basophils Absolute 0.0 0.0 - 0.1 K/uL   Immature Granulocytes 0 %   Abs Immature Granulocytes 0.02 0.00 - 0.07 K/uL    Comment: Performed at [facility], [address] [place], [place] [address]  Hemoglobin A1c     Status: Abnormal   Collection Time: 08/22/20  3:52 PM  Result Value Ref Range   Hgb A1c MFr Bld 9.3 (H) 4.8 - 5.6 %    Comment: (NOTE) Pre diabetes:           5.7%-6.4%  Diabetes:              >6.4%  Glycemic control for   <7.0% adults with diabetes    Mean Plasma Glucose 220.21 mg/dL    Comment: Performed at [facility] [address], [place], [place] [address]  TSH     Status: Abnormal  Collection Time: 08/22/20  5:56 PM  Result Value Ref Range   TSH 98.974 (H) 0.350 - 4.500 uIU/mL    Comment: Performed by a 3rd Generation assay with a functional sensitivity of <=0.01 uIU/mL. Performed at [facility], [place] [address] [place], [place] [address]   CBG monitoring, ED     Status: Abnormal   Collection Time: 08/22/20  9:34 PM  Result Value Ref Range   Glucose-Capillary 105 (H) 70 - 99 mg/dL    Comment: Glucose reference range applies only to samples taken after fasting for at least 8 hours.  SARS CORONAVIRUS 2 (TAT 6-24 HRS) Nasopharyngeal Nasopharyngeal Swab     Status: None   Collection Time: 08/22/20 11:13 PM   Specimen: Nasopharyngeal Swab  Result Value Ref Range   SARS Coronavirus 2 NEGATIVE NEGATIVE    Comment: (NOTE) SARS-CoV-2 target nucleic acids are NOT DETECTED.  The SARS-CoV-2 RNA is generally detectable in upper and lower respiratory specimens during the acute phase of infection. Negative results do not preclude SARS-CoV-2 infection, do not rule out co-infections with other pathogens, and should not be used as the sole basis for treatment or other patient management decisions. Negative results must be combined with clinical observations, patient history, and epidemiological information. The expected result is Negative.  Fact Sheet for Patients: SugarRoll.be  Fact Sheet for Healthcare Providers: https://www.woods-mathews.com/  This test is not yet approved or cleared by the [place] FDA and  has been authorized for detection and/or diagnosis of SARS-CoV-2 by FDA under an Emergency Use Authorization (EUA). This EUA will remain  in effect (meaning this test  can be used) for the duration of the COVID-19 declaration under Se ction 564(b)(1) of the Act, 21 U.S.C. section 360bbb-3(b)(1), unless the authorization is terminated or revoked sooner.  Performed at [facility], [place] [address] [place], [place] [address]   Glucose, capillary     Status: Abnormal   Collection Time: 08/23/20  6:48 AM  Result Value Ref Range   Glucose-Capillary 115 (H) 70 - 99 mg/dL    Comment: Glucose reference range applies only to samples taken after fasting for at least 8 hours.  Glucose, capillary     Status: Abnormal   Collection Time: 08/23/20  7:35 AM  Result Value Ref Range   Glucose-Capillary 119 (H) 70 - 99 mg/dL    Comment: Glucose reference range applies only to samples taken after fasting for at least 8 hours.  Urinalysis, Routine w reflex microscopic     Status: None   Collection Time: 08/23/20  7:40 AM  Result Value Ref Range   Color, Urine YELLOW YELLOW   APPearance CLEAR CLEAR   Specific Gravity, Urine 1.013 1.005 - 1.030   pH 5.0 5.0 - 8.0   Glucose, UA NEGATIVE NEGATIVE mg/dL   Hgb urine dipstick NEGATIVE NEGATIVE   Bilirubin Urine NEGATIVE NEGATIVE   Ketones, ur NEGATIVE NEGATIVE mg/dL   Protein, ur NEGATIVE NEGATIVE mg/dL   Nitrite NEGATIVE NEGATIVE   Leukocytes,Ua NEGATIVE NEGATIVE    Comment: Performed at [facility] [address] [place], [place] [address]  Glucose, capillary     Status: Abnormal   Collection Time: 08/23/20 12:02 PM  Result Value Ref Range   Glucose-Capillary 144 (H) 70 - 99 mg/dL    Comment: Glucose reference range applies only to samples taken after fasting for at least 8 hours.  Glucose, capillary     Status: Abnormal   Collection Time: 08/23/20  4:33 PM  Result Value Ref Range   Glucose-Capillary 142 (H) 70 - 99 mg/dL    Comment: Glucose reference range applies only to samples taken after fasting for at least 8 hours.  Glucose, capillary     Status: Abnormal   Collection Time: 08/23/20  8:04 PM   Result Value Ref Range   Glucose-Capillary 148 (H) 70 - 99 mg/dL    Comment: Glucose reference range applies only to samples taken after fasting for at least 8 hours.    CT L-SPINE NO CHARGE  Result Date: 08/22/2020 CLINICAL DATA:  Back pain for 2 weeks after fall in April. EXAM: CT LUMBAR SPINE WITHOUT CONTRAST TECHNIQUE: Multidetector CT imaging of the lumbar spine was performed without intravenous contrast administration. Multiplanar CT image reconstructions were also generated. COMPARISON:  Lumbar radiograph 08/08/2020 FINDINGS: Segmentation: 5 lumbar type vertebrae. Alignment: No listhesis. Vertebrae: Mild L1 superior endplate compression fracture likely subacute. There is approximately 30% loss of height centrally. Mild buckling of the posterior cortex without significant mass effect on the spinal canal. No involvement of the posterior elements. No additional fracture. The remaining vertebral body heights are preserved. No sacral fracture. Paraspinal and other soft tissues: No significant paraspinal hemorrhage. Aortic and branch atherosclerosis. Abdominal structures assessed on concurrent abdominopelvic CT, reported separately. Disc levels: Mild T12-L1 disc space narrowing and vacuum phenomenon. Disc spaces are preserved. No significant mass effect on the spinal canal related to L1 fracture. There is no canal narrowing. Facet hypertrophy at L5-S1. IMPRESSION: Mild L1 superior endplate compression fracture with approximately 30% loss of height centrally. Mild buckling of the posterior cortex without significant mass effect on the spinal canal. Findings are likely subacute. Electronically Signed   By: Keith Rake M.D.   On: 08/22/2020 16:37   CT Renal Stone Study  Result Date: 08/22/2020 CLINICAL DATA:  Back pain for several weeks following recent fall with hematuria, initial encounter EXAM: CT ABDOMEN AND PELVIS WITHOUT CONTRAST TECHNIQUE: Multidetector CT imaging of the abdomen and pelvis was  performed following the standard protocol without IV contrast. COMPARISON:  08/08/2020 FINDINGS: Lower chest: No acute abnormality. Hepatobiliary: Liver is within normal limits. Gallbladder is well distended with dependent gallstones. No ductal dilatation is seen. Pancreas: Unremarkable. No pancreatic ductal dilatation or surrounding inflammatory changes. Spleen: Normal in size without focal abnormality. Adrenals/Urinary Tract: Adrenal glands are within normal limits. Kidneys show vague hypodensities likely representing small cysts. No renal calculi or obstructive changes are noted. The ureters are within normal limits to the level of the urinary bladder. Bladder is well distended. Stomach/Bowel: Scattered diverticular changes noted without evidence of diverticulitis. Scattered fecal material is noted throughout the colon. No obstructive or inflammatory changes are seen. The appendix is within normal limits. No inflammatory changes are seen. Small bowel and stomach are unremarkable. Vascular/Lymphatic: Aortic atherosclerosis. No enlarged abdominal or pelvic lymph nodes. Reproductive: Status post hysterectomy. No adnexal masses. Other: No abdominal wall hernia or abnormality. No abdominopelvic ascites. Musculoskeletal: Degenerative changes of lumbar spine are noted. Partial lumbarization of S1 is seen. There is a superior endplate compression deformity at L1 identified which was not seen on the recent plain film examination and may have been related to the recent fall. No significant retropulsion is noted. No other focal bony abnormality is noted. IMPRESSION: L1 compression fracture not present on prior plain film examination and likely related to the recent injury. Diverticular change without diverticulitis. Cholelithiasis without complicating factors. No other focal abnormality is noted. Electronically Signed   By: Elta Guadeloupe  Lukens M.D.   On: 08/22/2020 16:34    Review of Systems  Unable to perform ROS: Dementia    Blood pressure 135/79, pulse 90, temperature 98.5 F (36.9 C), temperature source Oral, resp. rate 17, SpO2 95 %. Physical Exam Constitutional:      Appearance: She is obese.  HENT:     Head: Normocephalic and atraumatic.     Nose: Nose normal.  Eyes:     Extraocular Movements: Extraocular movements intact.     Pupils: Pupils are equal, round, and reactive to light.  Pulmonary:     Effort: Pulmonary effort is normal.     Breath sounds: Normal breath sounds.  Abdominal:     Palpations: Abdomen is soft.  Musculoskeletal:     Comments: Straight leg raising is negative to 60 degrees Patrick's maneuver is negative bilaterally  Skin:    General: Skin is dry.     Capillary Refill: Capillary refill takes less than 2 seconds.  Neurological:     Mental Status: She is alert.     Comments: Moves lower extremities well with strength of 4 out of 5 in iliopsoas quad tibialis anterior and gastroc.  Deep tendon reflexes are absent in the patella and the Achilles both.  Upper extremity strength and reflexes are intact.     Assessment/Plan: L1 superior endplate compression fracture.  Minimal loss of height of the vertebrae.  Plan: At this point she seems reasonably comfortable to be hard but to suggest that a kyphoplasty is necessary however if pain should become a substantial issue interventional radiology could be consulted for acrylic balloon kyphoplasty.  Arjay 08/23/2020, 8:08 PM

## 2020-08-23 NOTE — Progress Notes (Signed)
PROGRESS NOTE    Alyssa [name]  SWF:[id number] DOB: 12/15/1935 DOA: 08/22/2020 PCP: Ernestene Kiel, MD   Brief Narrative: Alyssa [name] is a 85 y.o. female with a history of anemia, CAD, chronic diastolic heart failure, hypertension, hyperlipidemia, hypothyroidism, PVD, diabetes mellitus type 2, dementia.  Patient presented secondary to back pain from multiple falls.  CT scan on admission was significant for L1 compression fracture.  She is also noted to have hematuria as an outpatient, however urinalysis appears unremarkable on admission.  CT abdomen pelvis without evidence of kidney stone.  While admitted, TSH resulted significantly elevated at 98.9 likely secondary to nonadherence with Synthroid dosing.  Likely SNF discharge when medically ready.   Assessment & Plan:   Principal Problem:   Lumbar compression fracture (HCC) Active Problems:   Hypothyroid   DM type 2 (diabetes mellitus, type 2) (HCC)   Essential hypertension   CAD (coronary artery disease)   L1 compression fracture Secondary to fall.  Patient was significant pain has affected her ability to mobilize and ambulate. -Continue oxycodone as needed -Neurosurgery consult for recommendations; may consider recommendation for kyphoplasty  Hyperlipidemia Patient is on Lipitor and Zetiahe -Continue Lipitor   Primary hypertension -Continue Coreg, losartan  Diabetes mellitus, type II Patient is on metformin, glimepiride and Levemir as an outpatient.  Hemoglobin A1c on admission of 9.3%. -Levemir 10 units qhs -Sliding scale insulin  Adrenal sufficiency Confirmed with daughter. Patient is on Cortef but prescribed at night. Blood pressure elevated. -Restart home hydrocortisone 15 mg  Hypothyroidism Patient is on Synthroid 125 mcg daily.  Upon discussion with daughter, does not.  She is adherent with her regimen.  TSH on admission of 98.9. -Resume home Synthroid 125 mcg daily  Dementia with delirium Patient has  had worsening delirium as an outpatient recently since her falls.  No focal neurological deficits.  Patient has been hard to manage by family members.  While coming up from the ED, patient given Geodon and Ativan.  Initial concern for possible UTI, however her urinalysis is unremarkable.  Patient is on Depakote ER, Aricept, Lexapro as an outpatient -Resume home Depakote ER, Aricept, Lexapro -Follow-up urine culture -Delirium precautions, try to treat patient's needs rather than behavior  Gross hematuria Mentioned by family but not seen while admitted.  Urinalysis without any RBCs or hemoglobin.  CT scan without structural abnormalities or evidence of kidney stone.  Urine culture still pending. -Follow-up urine culture   DVT prophylaxis: Xarelto prophylaxis dosing Code Status:   Code Status: Full Code Family Communication: Daughter on telephone Disposition Plan: Discharge likely to SNF pending improvement of pain control, stabilization of patient's delirium   Consultants:   Neurosurgery  Procedures:   None  Antimicrobials:  None   Subjective: Patient without any concerns.  Patient is very drowsy from receiving Geodon/Ativan overnight  Objective: Vitals:   08/23/20 0007 08/23/20 0009 08/23/20 0019 08/23/20 0733  BP: (!) 168/80  (!) 161/67 (!) 157/66  Pulse:   70 77  Resp: (!) 22  18 16   Temp:  98.2 F (36.8 C) 98.6 F (37 C) 99.4 F (37.4 C)  TempSrc:  Oral Oral Oral  SpO2:   98% 95%    Intake/Output Summary (Last 24 hours) at 08/23/2020 1041 Last data filed at 08/23/2020 0739 Gross per 24 hour  Intake --  Output 1731 ml  Net -1731 ml   There were no vitals filed for this visit.  Examination:  General exam: Appears calm and comfortable Respiratory system:  Clear to auscultation. Respiratory effort normal. Cardiovascular system: S1 & S2 heard, RRR.  Gastrointestinal system: Abdomen is nondistended, soft and nontender. No organomegaly or masses felt. Normal bowel  sounds heard. Central nervous system: Somnolent.  Answers questions appropriately but does not open her eyes.  Follows commands appropriately no focal deficits noted. Musculoskeletal: No edema. No calf tenderness Skin: No cyanosis. No rashes Psychiatry: Judgement and insight appear impaired.    Data Reviewed: I have personally reviewed following labs and imaging studies  CBC Lab Results  Component Value Date   WBC 6.6 08/22/2020   RBC 4.93 08/22/2020   HGB 14.0 08/22/2020   HCT 45.0 08/22/2020   MCV 91.3 08/22/2020   MCH 28.4 08/22/2020   PLT 265 08/22/2020   MCHC 31.1 08/22/2020   RDW 15.3 08/22/2020   LYMPHSABS 1.7 08/22/2020   MONOABS 0.4 08/22/2020   EOSABS 0.2 08/22/2020   BASOSABS 0.0 73/22/0254     Last metabolic panel Lab Results  Component Value Date   NA 141 08/22/2020   K 3.9 08/22/2020   CL 108 08/22/2020   CO2 24 08/22/2020   BUN 13 08/22/2020   CREATININE 0.79 08/22/2020   GLUCOSE 125 (H) 08/22/2020   GFRNONAA >60 08/22/2020   GFRAA >60 08/26/2019   CALCIUM 9.0 08/22/2020   PROT 6.6 08/22/2020   ALBUMIN 3.6 08/22/2020   BILITOT 0.7 08/22/2020   ALKPHOS 95 08/22/2020   AST 32 08/22/2020   ALT 23 08/22/2020   ANIONGAP 9 08/22/2020    CBG (last 3)  Recent Labs    08/22/20 2134 08/23/20 0648 08/23/20 0735  GLUCAP 105* 115* 119*     GFR: CrCl cannot be calculated (Unknown ideal weight.).  Coagulation Profile: No results for input(s): INR, PROTIME in the last 168 hours.  Recent Results (from the past 240 hour(s))  SARS CORONAVIRUS 2 (TAT 6-24 HRS) Nasopharyngeal Nasopharyngeal Swab     Status: None   Collection Time: 08/22/20 11:13 PM   Specimen: Nasopharyngeal Swab  Result Value Ref Range Status   SARS Coronavirus 2 NEGATIVE NEGATIVE Final    Comment: (NOTE) SARS-CoV-2 target nucleic acids are NOT DETECTED.  The SARS-CoV-2 RNA is generally detectable in upper and lower respiratory specimens during the acute phase of infection.  Negative results do not preclude SARS-CoV-2 infection, do not rule out co-infections with other pathogens, and should not be used as the sole basis for treatment or other patient management decisions. Negative results must be combined with clinical observations, patient history, and epidemiological information. The expected result is Negative.  Fact Sheet for Patients: SugarRoll.be  Fact Sheet for Healthcare Providers: https://www.woods-mathews.com/  This test is not yet approved or cleared by the [place] FDA and  has been authorized for detection and/or diagnosis of SARS-CoV-2 by FDA under an Emergency Use Authorization (EUA). This EUA will remain  in effect (meaning this test can be used) for the duration of the COVID-19 declaration under Se ction 564(b)(1) of the Act, 21 U.S.C. section 360bbb-3(b)(1), unless the authorization is terminated or revoked sooner.  Performed at [facility], [place] [address], [place], [place] [address]         Radiology Studies: CT L-SPINE NO CHARGE  Result Date: 08/22/2020 CLINICAL DATA:  Back pain for 2 weeks after fall in April. EXAM: CT LUMBAR SPINE WITHOUT CONTRAST TECHNIQUE: Multidetector CT imaging of the lumbar spine was performed without intravenous contrast administration. Multiplanar CT image reconstructions were also generated. COMPARISON:  Lumbar radiograph 08/08/2020 FINDINGS: Segmentation:  5 lumbar type vertebrae. Alignment: No listhesis. Vertebrae: Mild L1 superior endplate compression fracture likely subacute. There is approximately 30% loss of height centrally. Mild buckling of the posterior cortex without significant mass effect on the spinal canal. No involvement of the posterior elements. No additional fracture. The remaining vertebral body heights are preserved. No sacral fracture. Paraspinal and other soft tissues: No significant paraspinal hemorrhage. Aortic and branch  atherosclerosis. Abdominal structures assessed on concurrent abdominopelvic CT, reported separately. Disc levels: Mild T12-L1 disc space narrowing and vacuum phenomenon. Disc spaces are preserved. No significant mass effect on the spinal canal related to L1 fracture. There is no canal narrowing. Facet hypertrophy at L5-S1. IMPRESSION: Mild L1 superior endplate compression fracture with approximately 30% loss of height centrally. Mild buckling of the posterior cortex without significant mass effect on the spinal canal. Findings are likely subacute. Electronically Signed   By: Keith Rake M.D.   On: 08/22/2020 16:37   CT Renal Stone Study  Result Date: 08/22/2020 CLINICAL DATA:  Back pain for several weeks following recent fall with hematuria, initial encounter EXAM: CT ABDOMEN AND PELVIS WITHOUT CONTRAST TECHNIQUE: Multidetector CT imaging of the abdomen and pelvis was performed following the standard protocol without IV contrast. COMPARISON:  08/08/2020 FINDINGS: Lower chest: No acute abnormality. Hepatobiliary: Liver is within normal limits. Gallbladder is well distended with dependent gallstones. No ductal dilatation is seen. Pancreas: Unremarkable. No pancreatic ductal dilatation or surrounding inflammatory changes. Spleen: Normal in size without focal abnormality. Adrenals/Urinary Tract: Adrenal glands are within normal limits. Kidneys show vague hypodensities likely representing small cysts. No renal calculi or obstructive changes are noted. The ureters are within normal limits to the level of the urinary bladder. Bladder is well distended. Stomach/Bowel: Scattered diverticular changes noted without evidence of diverticulitis. Scattered fecal material is noted throughout the colon. No obstructive or inflammatory changes are seen. The appendix is within normal limits. No inflammatory changes are seen. Small bowel and stomach are unremarkable. Vascular/Lymphatic: Aortic atherosclerosis. No enlarged  abdominal or pelvic lymph nodes. Reproductive: Status post hysterectomy. No adnexal masses. Other: No abdominal wall hernia or abnormality. No abdominopelvic ascites. Musculoskeletal: Degenerative changes of lumbar spine are noted. Partial lumbarization of S1 is seen. There is a superior endplate compression deformity at L1 identified which was not seen on the recent plain film examination and may have been related to the recent fall. No significant retropulsion is noted. No other focal bony abnormality is noted. IMPRESSION: L1 compression fracture not present on prior plain film examination and likely related to the recent injury. Diverticular change without diverticulitis. Cholelithiasis without complicating factors. No other focal abnormality is noted. Electronically Signed   By: Inez Catalina M.D.   On: 08/22/2020 16:34        Scheduled Meds: . acetaminophen  1,000 mg Oral TID  . insulin aspart  0-5 Units Subcutaneous QHS  . insulin aspart  0-9 Units Subcutaneous TID WC   Continuous Infusions:   LOS: 0 days     Cordelia Poche, MD [facility] 08/23/2020, 10:41 AM  If 7PM-7AM, please contact night-coverage [email or web address]

## 2020-08-24 LAB — GLUCOSE, CAPILLARY
Glucose-Capillary: 122 mg/dL — ABNORMAL HIGH (ref 70–99)
Glucose-Capillary: 123 mg/dL — ABNORMAL HIGH (ref 70–99)
Glucose-Capillary: 116 mg/dL — ABNORMAL HIGH (ref 70–99)
Glucose-Capillary: 117 mg/dL — ABNORMAL HIGH (ref 70–99)
Glucose-Capillary: 171 mg/dL — ABNORMAL HIGH (ref 70–99)

## 2020-08-24 LAB — URINE CULTURE: Culture: NO GROWTH

## 2020-08-24 NOTE — Progress Notes (Signed)
Physical Therapy Treatment Patient Details Name: Alyssa [name] MRN: [id number] DOB: 09-May-1935 Today's Date: 08/24/2020    History of Present Illness The pt is an 85 yo female presenting 5/11 with c/o back pain that has been ongoing for 2 weeks since a fall in April. Upon work-up, pt found to have subacute L1 compression fx, and is presenting with acute delirium. PMH includes: CAD, NSTEMI x2, CHF, HLD, HTN, dementia, PVD, DM II, and adrenal insufficiency.    PT Comments    Pt supine in bed on arrival.  Pt able to move past bed mobility this session with use of sara stedy. Performed multiple sit to stands with moderate assistance.  Plan for SNF remains appropriate to improve strength and function before returning home.     Follow Up Recommendations  SNF;Supervision/Assistance - 24 hour     Equipment Recommendations  Wheelchair (measurements PT);Wheelchair cushion (measurements PT) (hoyer lift if return home.)    Recommendations for Other Services       Precautions / Restrictions Precautions Precautions: Back;Fall Precaution Comments: back for comfort, pt with x2 recent falls at home Restrictions Weight Bearing Restrictions: No    Mobility  Bed Mobility Overal bed mobility: Needs Assistance Bed Mobility: Rolling;Sidelying to Sit Rolling: Mod assist Sidelying to sit: Mod assist       General bed mobility comments: Decreased assistance and able to move to edge of bed to move into sitting.    Transfers Overall transfer level: Needs assistance Equipment used: Ambulation equipment used (sara stedy) Transfers: Sit to/from Stand Sit to Stand: Mod assist         General transfer comment: Cues for hand and foot placement.  Pt with peripheral vision deficits so at time required tactile cues.  Cues to pull on bar and push through LEs into standing.  Ambulation/Gait Ambulation/Gait assistance:  (NT performed standing in stedy only.)               Stairs              Wheelchair Mobility    Modified Rankin (Stroke Patients Only)       Balance Overall balance assessment: Needs assistance   Sitting balance-[name]: Fair       Standing balance-[name]: Poor Standing balance comment: heavy reliance on UE support to maintain standing.                            Cognition Arousal/Alertness: Lethargic Behavior During Therapy: Flat affect Overall Cognitive Status: History of cognitive impairments - at baseline Area of Impairment: Problem solving;Following commands                       Following Commands: Follows one step commands inconsistently;Follows one step commands with increased time     Problem Solving: Slow processing;Decreased initiation;Difficulty sequencing;Requires verbal cues;Requires tactile cues        Exercises      General Comments        Pertinent Vitals/Pain Pain Assessment: Faces Faces Pain Scale: Hurts whole lot Pain Location: back with bed mobility/transition Pain Descriptors / Indicators: Discomfort;Grimacing;Moaning Pain Intervention(s): Monitored during session;Repositioned    Home Living                      Prior Function            PT Goals (current goals can now be found in the care plan section)  Acute Rehab PT Goals Patient Stated Goal: to lay back down Potential to Achieve Goals: Fair Progress towards PT goals: Progressing toward goals    Frequency    Min 3X/week      PT Plan Current plan remains appropriate    Co-evaluation              AM-PAC PT "6 Clicks" Mobility   Outcome Measure  Help needed turning from your back to your side while in a flat bed without using bedrails?: A Lot Help needed moving from lying on your back to sitting on the side of a flat bed without using bedrails?: A Lot Help needed moving to and from a bed to a chair (including a wheelchair)?: A Lot Help needed standing up from a chair using your arms (e.g.,  wheelchair or bedside chair)?: A Lot Help needed to walk in hospital room?: Total Help needed climbing 3-5 steps with a railing? : Total 6 Click Score: 10    End of Session Equipment Utilized During Treatment: Gait belt Activity Tolerance: Patient limited by lethargy;Patient limited by pain Patient left: with call bell/phone within reach;in chair;with chair alarm set (sitter alarm belt and educated pt how to remove it .) Nurse Communication: Mobility status PT Visit Diagnosis: Other abnormalities of gait and mobility (R26.89);Muscle weakness (generalized) (M62.81);Difficulty in walking, not elsewhere classified (R26.2);Pain     Time: 0093-8182 PT Time Calculation (min) (ACUTE ONLY): 36 min  Charges:  $Therapeutic Activity: 23-37 mins                     Erasmo Leventhal , PTA Acute Rehabilitation Services Pager [phone] Office [phone]     [facility] Eli Hose 08/24/2020, 6:37 PM

## 2020-08-24 NOTE — Progress Notes (Signed)
PROGRESS NOTE    Alyssa [name]  ASN:[id number] DOB: 10-07-1935 DOA: 08/22/2020 PCP: Ernestene Kiel, MD   Brief Narrative: Alyssa [name] is a 85 y.o. female with a history of anemia, CAD, chronic diastolic heart failure, hypertension, hyperlipidemia, hypothyroidism, PVD, diabetes mellitus type 2, dementia.  Patient presented secondary to back pain from multiple falls.  CT scan on admission was significant for L1 compression fracture.  She is also noted to have hematuria as an outpatient, however urinalysis appears unremarkable on admission.  CT abdomen pelvis without evidence of kidney stone.  While admitted, TSH resulted significantly elevated at 98.9 likely secondary to nonadherence with Synthroid dosing.  Likely SNF discharge when medically ready.  08/24/2020: Patient is awaiting disposition.  No new complaints today.   Assessment & Plan:   Principal Problem:   Lumbar compression fracture (HCC) Active Problems:   Hypothyroid   DM type 2 (diabetes mellitus, type 2) (HCC)   Essential hypertension   CAD (coronary artery disease)   L1 compression fracture Secondary to fall.  Patient was significant pain has affected her ability to mobilize and ambulate. -Continue oxycodone as needed -Neurosurgery consult for recommendations; may consider recommendation for kyphoplasty  Hyperlipidemia Patient is on Lipitor and Zetiahe -Continue Lipitor   Primary hypertension -Continue Coreg, losartan  Diabetes mellitus, type II Patient is on metformin, glimepiride and Levemir as an outpatient.  Hemoglobin A1c on admission of 9.3%. -Levemir 10 units qhs -Sliding scale insulin  Adrenal sufficiency Confirmed with daughter. Patient is on Cortef but prescribed at night. Blood pressure elevated. -Restart home hydrocortisone 15 mg  Hypothyroidism Patient is on Synthroid 125 mcg daily.  Upon discussion with daughter, does not.  She is adherent with her regimen.  TSH on admission of  98.9. -Resume home Synthroid 125 mcg daily  Dementia with delirium Patient has had worsening delirium as an outpatient recently since her falls.  No focal neurological deficits.  Patient has been hard to manage by family members.  While coming up from the ED, patient given Geodon and Ativan.  Initial concern for possible UTI, however her urinalysis is unremarkable.  Patient is on Depakote ER, Aricept, Lexapro as an outpatient -Resume home Depakote ER, Aricept, Lexapro -Follow-up urine culture -Delirium precautions, try to treat patient's needs rather than behavior  Gross hematuria Mentioned by family but not seen while admitted.  Urinalysis without any RBCs or hemoglobin.  CT scan without structural abnormalities or evidence of kidney stone.  Urine culture still pending. -Follow-up urine culture   DVT prophylaxis: Xarelto prophylaxis dosing Code Status:   Code Status: Full Code Family Communication: Daughter on telephone Disposition Plan: Discharge likely to SNF pending improvement of pain control, stabilization of patient's delirium   Consultants:   Neurosurgery  Procedures:   None  Antimicrobials:  None   Subjective: No new complaints.     Objective: Vitals:   08/23/20 1530 08/23/20 1957 08/24/20 0700 08/24/20 1300  BP: 135/79 (!) 145/86 (!) 159/63 (!) 160/72  Pulse: 90 83 61 64  Resp: 17 15 14 15   Temp: 98.5 F (36.9 C) (!) 97.5 F (36.4 C) 98.4 F (36.9 C) 97.9 F (36.6 C)  TempSrc: Oral Oral Oral Oral  SpO2: 95% 93% 98% 96%    Intake/Output Summary (Last 24 hours) at 08/24/2020 1643 Last data filed at 08/24/2020 0900 Gross per 24 hour  Intake 600 ml  Output 800 ml  Net -200 ml   There were no vitals filed for this visit.  Examination:  General exam: Appears calm and comfortable Respiratory system: Clear to auscultation. Respiratory effort normal. Cardiovascular system: S1 & S2 heard.  Gastrointestinal system: Abdomen is obese, soft and  nontender. Central nervous system: Somnolent.  A Musculoskeletal: No edema.   Data Reviewed: I have personally reviewed following labs and imaging studies  CBC Lab Results  Component Value Date   WBC 6.6 08/22/2020   RBC 4.93 08/22/2020   HGB 14.0 08/22/2020   HCT 45.0 08/22/2020   MCV 91.3 08/22/2020   MCH 28.4 08/22/2020   PLT 265 08/22/2020   MCHC 31.1 08/22/2020   RDW 15.3 08/22/2020   LYMPHSABS 1.7 08/22/2020   MONOABS 0.4 08/22/2020   EOSABS 0.2 08/22/2020   BASOSABS 0.0 16/01/9603     Last metabolic panel Lab Results  Component Value Date   NA 141 08/22/2020   K 3.9 08/22/2020   CL 108 08/22/2020   CO2 24 08/22/2020   BUN 13 08/22/2020   CREATININE 0.79 08/22/2020   GLUCOSE 125 (H) 08/22/2020   GFRNONAA >60 08/22/2020   GFRAA >60 08/26/2019   CALCIUM 9.0 08/22/2020   PROT 6.6 08/22/2020   ALBUMIN 3.6 08/22/2020   BILITOT 0.7 08/22/2020   ALKPHOS 95 08/22/2020   AST 32 08/22/2020   ALT 23 08/22/2020   ANIONGAP 9 08/22/2020    CBG (last 3)  Recent Labs    08/23/20 2004 08/24/20 0702 08/24/20 1245  GLUCAP 148* 122* 123*     GFR: CrCl cannot be calculated (Unknown ideal weight.).  Coagulation Profile: No results for input(s): INR, PROTIME in the last 168 hours.  Recent Results (from the past 240 hour(s))  SARS CORONAVIRUS 2 (TAT 6-24 HRS) Nasopharyngeal Nasopharyngeal Swab     Status: None   Collection Time: 08/22/20 11:13 PM   Specimen: Nasopharyngeal Swab  Result Value Ref Range Status   SARS Coronavirus 2 NEGATIVE NEGATIVE Final    Comment: (NOTE) SARS-CoV-2 target nucleic acids are NOT DETECTED.  The SARS-CoV-2 RNA is generally detectable in upper and lower respiratory specimens during the acute phase of infection. Negative results do not preclude SARS-CoV-2 infection, do not rule out co-infections with other pathogens, and should not be used as the sole basis for treatment or other patient management decisions. Negative results must  be combined with clinical observations, patient history, and epidemiological information. The expected result is Negative.  Fact Sheet for Patients: SugarRoll.be  Fact Sheet for Healthcare Providers: https://www.woods-mathews.com/  This test is not yet approved or cleared by the [place] FDA and  has been authorized for detection and/or diagnosis of SARS-CoV-2 by FDA under an Emergency Use Authorization (EUA). This EUA will remain  in effect (meaning this test can be used) for the duration of the COVID-19 declaration under Se ction 564(b)(1) of the Act, 21 U.S.C. section 360bbb-3(b)(1), unless the authorization is terminated or revoked sooner.  Performed at [facility], [place] [address], [place], [place] [address]   Urine culture     Status: None   Collection Time: 08/23/20  7:00 AM   Specimen: Urine, Random  Result Value Ref Range Status   Specimen Description URINE, RANDOM  Final   Special Requests NONE  Final   Culture   Final    NO GROWTH Performed at [facility], [place] [address] [place], [place] [address]    Report Status 08/24/2020 FINAL  Final        Radiology Studies: No results found.      Scheduled Meds: .  acetaminophen  1,000 mg Oral TID  . atorvastatin  80 mg Oral QPM  . carvedilol  3.125 mg Oral BID  . clopidogrel  75 mg Oral QPM  . divalproex  250 mg Oral QPM  . donepezil  10 mg Oral QPM  . escitalopram  20 mg Oral QPM  . ezetimibe  10 mg Oral QPM  . hydrocortisone  15 mg Oral QPM  . insulin aspart  0-5 Units Subcutaneous QHS  . insulin aspart  0-9 Units Subcutaneous TID WC  . insulin detemir  10 Units Subcutaneous QPM  . levothyroxine  125 mcg Oral Q0600  . losartan  50 mg Oral QPM  . polyethylene glycol  17 g Oral Daily  . rivaroxaban  10 mg Oral Daily   Continuous Infusions:   LOS: 1 day     Dana Allan MD [facility] 08/24/2020, 4:43 PM  If 7PM-7AM, please  contact night-coverage [email or web address]

## 2020-08-24 NOTE — NC FL2 (Signed)
[place] LEVEL OF CARE SCREENING TOOL     IDENTIFICATION  Patient Name: Alyssa [name] Birthdate: 1935/05/06 Sex: female Admission Date (Current Location): 08/22/2020  [facility] and [place] Number:  Herbalist and Address:         Provider Number: [id number]  Attending Physician Name and Address:  Bonnell Public, MD  Relative Name and Phone Number:       Current Level of Care: Hospital Recommended Level of Care: [place] Prior Approval Number:    Date Approved/Denied:   PASRR Number: [id number]  Discharge Plan: SNF    Current Diagnoses: Patient Active Problem List   Diagnosis Date Noted  . Lumbar compression fracture ([place]) 08/22/2020  . Adrenal insufficiency ([place]) 03/09/2019  . Hypothermia   . Acute metabolic encephalopathy due to hypoglycemia   . Hypoglycemia 03/07/2019  . Arthralgia of left temporomandibular joint 02/09/2018  . Referred otalgia of left ear 02/09/2018  . Memory disorder 10/06/2017  . Insulin dependent diabetes mellitus 11/19/2016  . Chest pain 11/18/2016  . Diabetes mellitus without complication ([place]) 93/81/8299  . Dysthymia 06/20/2014  . Hypertensive heart disease 04/11/2014  . Anemia   . History of GI bleed   . CAD (coronary artery disease)   . HLD (hyperlipidemia)   . Peripheral vascular disease ([place])   . Chronic diastolic CHF (congestive heart failure) ([place])   . Essential hypertension   . NSTEMI (non-ST elevated myocardial infarction) ([place]) 04/01/2014  . Refusal of blood transfusions as patient is Jehovah's Witness 09/29/2012  . Blood loss anemia 02/26/2012  . Melena 02/25/2012  . GERD (gastroesophageal reflux disease) 11/11/2011  . DM type 2 (diabetes mellitus, type 2) ([place]) 05/26/2011  . Hypothyroid     Orientation RESPIRATION BLADDER Height & Weight     Self  Normal External catheter Weight:   Height:     BEHAVIORAL SYMPTOMS/MOOD NEUROLOGICAL BOWEL NUTRITION STATUS      Continent  Diet (Refer to d/c summary)  AMBULATORY STATUS COMMUNICATION OF NEEDS Skin   Extensive Assist Verbally Normal                       Personal Care Assistance Level of Assistance  Bathing,Feeding,Dressing Bathing Assistance: Maximum assistance Feeding assistance: Limited assistance Dressing Assistance: Maximum assistance     Functional Limitations Info  Sight,Hearing,Speech Sight Info: Adequate Hearing Info: Adequate Speech Info: Adequate    SPECIAL CARE FACTORS FREQUENCY  PT (By licensed PT),OT (By licensed OT)     PT Frequency: 5x/week , evaluate and treat OT Frequency: 5x/week , evaluate and treat            Contractures Contractures Info: Not present    Additional Factors Info  Code Status,Allergies Code Status Info: Full Code Allergies Info: Tetanus Toxoid, Adsorbed           Current Medications (08/24/2020):  This is the current hospital active medication list Current Facility-Administered Medications  Medication Dose Route Frequency Provider Last Rate Last Admin  . acetaminophen (TYLENOL) tablet 1,000 mg  1,000 mg Oral TID Mariel Aloe, MD   1,000 mg at 08/24/20 0914   Followed by  . [START ON 08/28/2020] acetaminophen (TYLENOL) tablet 650 mg  650 mg Oral Q6H PRN Mariel Aloe, MD      . atorvastatin (LIPITOR) tablet 80 mg  80 mg Oral QPM Mariel Aloe, MD   80 mg at 08/23/20 1728  . carvedilol (COREG) tablet 3.125 mg  3.125  mg Oral BID Mariel Aloe, MD   3.125 mg at 08/24/20 5885  . clopidogrel (PLAVIX) tablet 75 mg  75 mg Oral QPM Mariel Aloe, MD   75 mg at 08/23/20 1728  . divalproex (DEPAKOTE ER) 24 hr tablet 250 mg  250 mg Oral QPM Mariel Aloe, MD   250 mg at 08/23/20 1728  . donepezil (ARICEPT) tablet 10 mg  10 mg Oral QPM Mariel Aloe, MD   10 mg at 08/23/20 1728  . escitalopram (LEXAPRO) tablet 20 mg  20 mg Oral QPM Mariel Aloe, MD   20 mg at 08/23/20 1728  . ezetimibe (ZETIA) tablet 10 mg  10 mg Oral QPM Mariel Aloe, MD    10 mg at 08/23/20 1728  . fluticasone (FLONASE) 50 MCG/ACT nasal spray 2 spray  2 spray Each Nare Daily PRN Mariel Aloe, MD      . hydrocortisone (CORTEF) tablet 15 mg  15 mg Oral QPM Mariel Aloe, MD   15 mg at 08/23/20 1727  . insulin aspart (novoLOG) injection 0-5 Units  0-5 Units Subcutaneous QHS Shela Leff, MD      . insulin aspart (novoLOG) injection 0-9 Units  0-9 Units Subcutaneous TID WC Shela Leff, MD   1 Units at 08/23/20 1218  . insulin detemir (LEVEMIR) injection 10 Units  10 Units Subcutaneous QPM Mariel Aloe, MD      . levothyroxine (SYNTHROID) tablet 125 mcg  125 mcg Oral Q0600 Mariel Aloe, MD      . losartan (COZAAR) tablet 50 mg  50 mg Oral QPM Mariel Aloe, MD   50 mg at 08/23/20 1728  . oxyCODONE (Oxy IR/ROXICODONE) immediate release tablet 5 mg  5 mg Oral Q6H PRN Shela Leff, MD      . polyethylene glycol (MIRALAX / GLYCOLAX) packet 17 g  17 g Oral Daily Mariel Aloe, MD   17 g at 08/24/20 0914  . rivaroxaban (XARELTO) tablet 10 mg  10 mg Oral Daily Mariel Aloe, MD   10 mg at 08/24/20 0277     Discharge Medications: Please see discharge summary for a list of discharge medications.  Relevant Imaging Results:  Relevant Lab Results:   Additional Information SS# [id number]  Sharin Mons, RN

## 2020-08-24 NOTE — TOC Initial Note (Signed)
Transition of Care [facility]) - Initial/Assessment Note    Patient Details  Name: Alyssa [name] MRN: [id number] Date of Birth: 28-Feb-1936  [facility]) CM/SW Contact:    Sharin Mons, RN Phone Number: 08/24/2020, 11:10 AM  Clinical Narrative:         Admitted with c/o back pain / L1 compression fracture , hx of recent fall.     RNCM received consult for possible SNF placement at time of discharge. Pt only oriented to person.NCM spoke with patient's daughter [name]  regarding PT's recommendation of SNF placement at time of discharge. [name] reported that she  is currently unable to care for patient at their home given patient's current physical needs and fall risk and would like for pt to benefit from [place] rehab. Expressed understanding of PT recommendation and is agreeable to SNF placement at time of discharge for pt.  Reports no preference for SNF . RNCM discussed insurance authorization process and provided Medicare SNF ratings list. [name] expressed being hopeful for rehab and pt to feel better soon. No further questions reported at this time. RNCM to continue to follow and assist with discharge planning needs. Pt is COVID vaccinated and has had  Booster.  Expected Discharge Plan: Skilled Nursing Facility Barriers to Discharge: Continued Medical Work up,No SNF bed,Insurance Authorization   Patient Goals and CMS Choice   CMS Medicare.gov Compare Post Acute Care list provided to:: Patient    Expected Discharge Plan and Services Expected Discharge Plan: [place]   Discharge Planning Services: CM Consult   Living arrangements for the past 2 months: Single Family Home                                      Prior Living Arrangements/Services Living arrangements for the past 2 months: Single Family Home Lives with:: Adult Children                   Activities of Daily Living Home Assistive Devices/Equipment: None ADL Screening (condition at  time of admission) Patient's cognitive ability adequate to safely complete daily activities?: No Is the patient deaf or have difficulty hearing?: No Does the patient have difficulty seeing, even when wearing glasses/contacts?: Yes Does the patient have difficulty concentrating, remembering, or making decisions?: Yes Patient able to express need for assistance with ADLs?: Yes Does the patient have difficulty dressing or bathing?: Yes Independently performs ADLs?: No Does the patient have difficulty walking or climbing stairs?: Yes Weakness of Legs: Both Weakness of Arms/Hands: Both  Permission Sought/Granted                  Emotional Assessment              Admission diagnosis:  Fall [W19.XXXA] Lumbar compression fracture ([place]) [S32.000A] Closed compression fracture of body of L1 vertebra ([place]) [S32.010A] Patient Active Problem List   Diagnosis Date Noted  . Lumbar compression fracture ([place]) 08/22/2020  . Adrenal insufficiency ([place]) 03/09/2019  . Hypothermia   . Acute metabolic encephalopathy due to hypoglycemia   . Hypoglycemia 03/07/2019  . Arthralgia of left temporomandibular joint 02/09/2018  . Referred otalgia of left ear 02/09/2018  . Memory disorder 10/06/2017  . Insulin dependent diabetes mellitus 11/19/2016  . Chest pain 11/18/2016  . Diabetes mellitus without complication ([place]) 93/71/6967  . Dysthymia 06/20/2014  . Hypertensive heart disease 04/11/2014  . Anemia   .  History of GI bleed   . CAD (coronary artery disease)   . HLD (hyperlipidemia)   . Peripheral vascular disease ([place])   . Chronic diastolic CHF (congestive heart failure) ([place])   . Essential hypertension   . NSTEMI (non-ST elevated myocardial infarction) ([place]) 04/01/2014  . Refusal of blood transfusions as patient is Jehovah's Witness 09/29/2012  . Blood loss anemia 02/26/2012  . Melena 02/25/2012  . GERD (gastroesophageal reflux disease) 11/11/2011  . DM type 2 (diabetes mellitus, type  2) ([place]) 05/26/2011  . Hypothyroid    PCP:  Ernestene Kiel, MD Pharmacy:   [place], [place] - [address] [place] [address] Phone: [phone] Fax: [phone]  [place], [place] - [address] [place] [address] Phone: [phone] Fax: [phone]     Social Determinants of Health (SDOH) Interventions    Readmission Risk Interventions Readmission Risk Prevention Plan 03/08/2019  Post Dischage Appt Not Complete  Appt Comments await discharge timing  Medication Screening Complete  Transportation Screening Complete  Some recent data might be hidden

## 2020-08-25 DIAGNOSIS — I2510 Atherosclerotic heart disease of native coronary artery without angina pectoris: Secondary | ICD-10-CM

## 2020-08-25 LAB — GLUCOSE, CAPILLARY
Glucose-Capillary: 106 mg/dL — ABNORMAL HIGH (ref 70–99)
Glucose-Capillary: 107 mg/dL — ABNORMAL HIGH (ref 70–99)
Glucose-Capillary: 118 mg/dL — ABNORMAL HIGH (ref 70–99)
Glucose-Capillary: 124 mg/dL — ABNORMAL HIGH (ref 70–99)
Glucose-Capillary: 167 mg/dL — ABNORMAL HIGH (ref 70–99)

## 2020-08-25 MED ORDER — CHLORHEXIDINE GLUCONATE CLOTH 2 % EX PADS
6.0000 | MEDICATED_PAD | Freq: Every day | CUTANEOUS | Status: DC
  Administered 2020-08-25 – 2020-08-27 (×3): 6 via TOPICAL

## 2020-08-25 MED ORDER — LORAZEPAM 2 MG/ML IJ SOLN
0.5000 mg | Freq: Once | INTRAMUSCULAR | Status: AC
  Administered 2020-08-25: 0.5 mg via INTRAVENOUS
  Filled 2020-08-25: qty 1

## 2020-08-25 NOTE — Progress Notes (Signed)
Soft wrist restraints applied @0402 . Non violent restraint chechlist started on flowsheets. Pt remains uncooperative, agitated and combative. Will assess @0602 

## 2020-08-25 NOTE — Progress Notes (Signed)
PROGRESS NOTE    Alyssa [name]  JHE:[id number] DOB: [birth year] DOA: 08/22/2020 PCP: Ernestene Kiel, MD   Brief Narrative: Alyssa [name] is a 85 y.o. female with a history of anemia, CAD, chronic diastolic heart failure, hypertension, hyperlipidemia, hypothyroidism, PVD, diabetes mellitus type 2, dementia.  Patient presented secondary to back pain from multiple falls.  CT scan on admission was significant for L1 compression fracture.  She is also noted to have hematuria as an outpatient, however urinalysis appears unremarkable on admission.  CT abdomen pelvis without evidence of kidney stone.  While admitted, TSH resulted significantly elevated at 98.9 likely secondary to nonadherence with Synthroid dosing.  Likely SNF discharge when medically ready.  08/25/2020: Patient is awaiting disposition.  No new complaints today.   Assessment & Plan:   Principal Problem:   Lumbar compression fracture (HCC) Active Problems:   Hypothyroid   DM type 2 (diabetes mellitus, type 2) (HCC)   Essential hypertension   CAD (coronary artery disease)   L1 compression fracture Secondary to fall.  Patient was significant pain has affected her ability to mobilize and ambulate. -Continue oxycodone as needed -Neurosurgery consult for recommendations; may consider recommendation for kyphoplasty  Hyperlipidemia Patient is on Lipitor and Zetiahe -Continue Lipitor   Primary hypertension -Continue Coreg, losartan  Diabetes mellitus, type II Patient is on metformin, glimepiride and Levemir as an outpatient.  Hemoglobin A1c on admission of 9.3%. -Levemir 10 units qhs -Sliding scale insulin  Adrenal sufficiency Confirmed with daughter. Patient is on Cortef but prescribed at night. Blood pressure elevated. -Restart home hydrocortisone 15 mg  Hypothyroidism Patient is on Synthroid 125 mcg daily.  Upon discussion with daughter, does not.  She is adherent with her regimen.  TSH on admission of  98.9. -Resume home Synthroid 125 mcg daily  Dementia with delirium Patient has had worsening delirium as an outpatient recently since her falls.  No focal neurological deficits.  Patient has been hard to manage by family members.  While coming up from the ED, patient given Geodon and Ativan.  Initial concern for possible UTI, however her urinalysis is unremarkable.  Patient is on Depakote ER, Aricept, Lexapro as an outpatient -Resume home Depakote ER, Aricept, Lexapro -Follow-up urine culture -Delirium precautions, try to treat patient's needs rather than behavior  Gross hematuria Mentioned by family but not seen while admitted.  Urinalysis without any RBCs or hemoglobin.  CT scan without structural abnormalities or evidence of kidney stone.  Urine culture still pending. -Follow-up urine culture   DVT prophylaxis: Xarelto prophylaxis dosing Code Status:   Code Status: Full Code Family Communication: Daughter on telephone Disposition Plan: Discharge likely to SNF pending improvement of pain control, stabilization of patient's delirium   Consultants:   Neurosurgery  Procedures:   None  Antimicrobials:  None   Subjective: No new complaints.     Objective: Vitals:   08/24/20 1300 08/24/20 2100 08/25/20 0700 08/25/20 1300  BP: (!) 160/72 (!) 158/76    Pulse: 64 72 88   Resp: 15     Temp: 97.9 F (36.6 C) 98.7 F (37.1 C) 98.2 F (36.8 C)   TempSrc: Oral Oral    SpO2: 96% 96% 94%   Weight:    65.8 kg  Height:    5\' 2"  (1.575 m)    Intake/Output Summary (Last 24 hours) at 08/25/2020 1636 Last data filed at 08/25/2020 1459 Gross per 24 hour  Intake 120 ml  Output 650 ml  Net -530 ml  Filed Weights   08/25/20 1300  Weight: 65.8 kg    Examination:  General exam: Appears calm and comfortable Respiratory system: Clear to auscultation. Respiratory effort normal. Cardiovascular system: S1 & S2 heard.  Gastrointestinal system: Abdomen is obese, soft and  nontender. Central nervous system: Somnolent.  A Musculoskeletal: No edema.   Data Reviewed: I have personally reviewed following labs and imaging studies  CBC Lab Results  Component Value Date   WBC 6.6 08/22/2020   RBC 4.93 08/22/2020   HGB 14.0 08/22/2020   HCT 45.0 08/22/2020   MCV 91.3 08/22/2020   MCH 28.4 08/22/2020   PLT 265 08/22/2020   MCHC 31.1 08/22/2020   RDW 15.3 08/22/2020   LYMPHSABS 1.7 08/22/2020   MONOABS 0.4 08/22/2020   EOSABS 0.2 08/22/2020   BASOSABS 0.0 14/78/2956     Last metabolic panel Lab Results  Component Value Date   NA 141 08/22/2020   K 3.9 08/22/2020   CL 108 08/22/2020   CO2 24 08/22/2020   BUN 13 08/22/2020   CREATININE 0.79 08/22/2020   GLUCOSE 125 (H) 08/22/2020   GFRNONAA >60 08/22/2020   GFRAA >60 08/26/2019   CALCIUM 9.0 08/22/2020   PROT 6.6 08/22/2020   ALBUMIN 3.6 08/22/2020   BILITOT 0.7 08/22/2020   ALKPHOS 95 08/22/2020   AST 32 08/22/2020   ALT 23 08/22/2020   ANIONGAP 9 08/22/2020    CBG (last 3)  Recent Labs    08/24/20 2134 08/25/20 1013 08/25/20 1206  GLUCAP 171* 107* 124*     GFR: Estimated Creatinine Clearance: 48.3 mL/min (by C-G formula based on SCr of 0.79 mg/dL).  Coagulation Profile: No results for input(s): INR, PROTIME in the last 168 hours.  Recent Results (from the past 240 hour(s))  SARS CORONAVIRUS 2 (TAT 6-24 HRS) Nasopharyngeal Nasopharyngeal Swab     Status: None   Collection Time: 08/22/20 11:13 PM   Specimen: Nasopharyngeal Swab  Result Value Ref Range Status   SARS Coronavirus 2 NEGATIVE NEGATIVE Final    Comment: (NOTE) SARS-CoV-2 target nucleic acids are NOT DETECTED.  The SARS-CoV-2 RNA is generally detectable in upper and lower respiratory specimens during the acute phase of infection. Negative results do not preclude SARS-CoV-2 infection, do not rule out co-infections with other pathogens, and should not be used as the sole basis for treatment or other patient  management decisions. Negative results must be combined with clinical observations, patient history, and epidemiological information. The expected result is Negative.  Fact Sheet for Patients: SugarRoll.be  Fact Sheet for Healthcare Providers: https://www.woods-mathews.com/  This test is not yet approved or cleared by the [place] FDA and  has been authorized for detection and/or diagnosis of SARS-CoV-2 by FDA under an Emergency Use Authorization (EUA). This EUA will remain  in effect (meaning this test can be used) for the duration of the COVID-19 declaration under Se ction 564(b)(1) of the Act, 21 U.S.C. section 360bbb-3(b)(1), unless the authorization is terminated or revoked sooner.  Performed at [facility], [place] [address], [place], [place] [address]   Urine culture     Status: None   Collection Time: 08/23/20  7:00 AM   Specimen: Urine, Random  Result Value Ref Range Status   Specimen Description URINE, RANDOM  Final   Special Requests NONE  Final   Culture   Final    NO GROWTH Performed at [facility], [place] [address], [place],  [address]    Report Status 08/24/2020 FINAL  Final        Radiology Studies: No results found.      Scheduled Meds: . acetaminophen  1,000 mg Oral TID  . atorvastatin  80 mg Oral QPM  . carvedilol  3.125 mg Oral BID  . clopidogrel  75 mg Oral QPM  . divalproex  250 mg Oral QPM  . donepezil  10 mg Oral QPM  . escitalopram  20 mg Oral QPM  . ezetimibe  10 mg Oral QPM  . hydrocortisone  15 mg Oral QPM  . insulin aspart  0-5 Units Subcutaneous QHS  . insulin aspart  0-9 Units Subcutaneous TID WC  . insulin detemir  10 Units Subcutaneous QPM  . levothyroxine  125 mcg Oral Q0600  . losartan  50 mg Oral QPM  . polyethylene glycol  17 g Oral Daily  . rivaroxaban  10 mg Oral Daily   Continuous Infusions:   LOS: 2 days     Dana Allan MD [facility] 08/25/2020, 4:36 PM  If 7PM-7AM, please contact night-coverage [email or web address]

## 2020-08-25 NOTE — Progress Notes (Signed)
Bladder scan showed 950 mls in bladder, placed indwelling foley per physician order. Patient was combative during procedure and it required 3 staff members to accomplish safely.

## 2020-08-25 NOTE — Progress Notes (Signed)
Pt combative and agitated. Pt removed leads and threw tele box at staff. Pt pulled IV. Refuses to cooperate with staff. Repeatedly attempting to climb out of bed and attempts to hit and bite staff when redirected. On call paged for further instructions/orders

## 2020-08-25 NOTE — Plan of Care (Signed)
  Problem: Education: Goal: Knowledge of General Education information will improve Description: Including pain rating scale, medication(s)/side effects and non-pharmacologic comfort measures Outcome: Not Progressing   Problem: Safety: Goal: Non-violent Restraint(s) Outcome: Not Progressing

## 2020-08-26 LAB — GLUCOSE, CAPILLARY
Glucose-Capillary: 90 mg/dL (ref 70–99)
Glucose-Capillary: 79 mg/dL (ref 70–99)
Glucose-Capillary: 181 mg/dL — ABNORMAL HIGH (ref 70–99)

## 2020-08-26 NOTE — Plan of Care (Signed)
  Problem: Safety: Goal: Non-violent Restraint(s) Outcome: Progressing   Problem: Education: Goal: Knowledge of General Education information will improve Description: Including pain rating scale, medication(s)/side effects and non-pharmacologic comfort measures Outcome: Progressing

## 2020-08-26 NOTE — Progress Notes (Signed)
PROGRESS NOTE    Alyssa [name]  XBM:[id number] DOB: May 31, 1935 DOA: 08/22/2020 PCP: Ernestene Kiel, MD   Brief Narrative: Alyssa [name] is an 85 y.o. female with a history of anemia, CAD, chronic diastolic heart failure, hypertension, hyperlipidemia, hypothyroidism, PVD, diabetes mellitus type 2, dementia.  Patient presented secondary to back pain from multiple falls.  CT scan on admission was significant for L1 compression fracture.  She is also noted to have hematuria as an outpatient, however urinalysis appears unremarkable on admission.  CT abdomen pelvis without evidence of kidney stone.  While admitted, TSH resulted significantly elevated at 98.9 likely secondary to nonadherence with Synthroid dosing.  Likely SNF discharge when medically ready.  08/26/2020: Patient is awaiting disposition.  No new complaints today.   Assessment & Plan:   Principal Problem:   Lumbar compression fracture (HCC) Active Problems:   Hypothyroid   DM type 2 (diabetes mellitus, type 2) (HCC)   Essential hypertension   CAD (coronary artery disease)   L1 compression fracture Secondary to fall.  Patient was significant pain has affected her ability to mobilize and ambulate. -Continue oxycodone as needed -Neurosurgery consult for recommendations; may consider recommendation for kyphoplasty  Hyperlipidemia Patient is on Lipitor and Zetiahe -Continue Lipitor   Primary hypertension -Continue Coreg, losartan  Diabetes mellitus, type II Patient is on metformin, glimepiride and Levemir as an outpatient.  Hemoglobin A1c on admission of 9.3%. -Levemir 10 units qhs -Sliding scale insulin  Adrenal sufficiency Confirmed with daughter. Patient is on Cortef but prescribed at night. Blood pressure elevated. -Restart home hydrocortisone 15 mg  Hypothyroidism Patient is on Synthroid 125 mcg daily.  Upon discussion with daughter, does not.  She is adherent with her regimen.  TSH on admission of  98.9. -Resume home Synthroid 125 mcg daily  Dementia with delirium Patient has had worsening delirium as an outpatient recently since her falls.  No focal neurological deficits.  Patient has been hard to manage by family members.  While coming up from the ED, patient given Geodon and Ativan.  Initial concern for possible UTI, however her urinalysis is unremarkable.  Patient is on Depakote ER, Aricept, Lexapro as an outpatient -Resume home Depakote ER, Aricept, Lexapro -Follow-up urine culture -Delirium precautions, try to treat patient's needs rather than behavior  Gross hematuria Mentioned by family but not seen while admitted.  Urinalysis without any RBCs or hemoglobin.  CT scan without structural abnormalities or evidence of kidney stone.  Urine culture still pending. -Follow-up urine culture   DVT prophylaxis: Xarelto prophylaxis dosing Code Status:   Code Status: Full Code Family Communication: Daughter on telephone Disposition Plan: Discharge likely to SNF pending improvement of pain control, stabilization of patient's delirium   Consultants:   Neurosurgery  Procedures:   None  Antimicrobials:  None   Subjective: No new complaints.     Objective: Vitals:   08/25/20 1955 08/26/20 0702 08/26/20 1116 08/26/20 1500  BP: 104/83 (!) 154/81 (!) 142/68 121/72  Pulse: 88 88 73 91  Resp: 20 16 18 19   Temp: 98.9 F (37.2 C)  97.8 F (36.6 C) 97.6 F (36.4 C)  TempSrc: Oral  Oral Oral  SpO2: 97% 97% 96% 99%  Weight:      Height:        Intake/Output Summary (Last 24 hours) at 08/26/2020 1600 Last data filed at 08/26/2020 1359 Gross per 24 hour  Intake 360 ml  Output 300 ml  Net 60 ml   [employer]  08/25/20 1300  Weight: 65.8 kg    Examination:  General exam: Appears calm and comfortable Respiratory system: Clear to auscultation. Respiratory effort normal. Cardiovascular system: S1 & S2 heard.  Gastrointestinal system: Abdomen is obese, soft and  nontender. Central nervous system: Somnolent.  A Musculoskeletal: No edema.   Data Reviewed: I have personally reviewed following labs and imaging studies  CBC Lab Results  Component Value Date   WBC 6.6 08/22/2020   RBC 4.93 08/22/2020   HGB 14.0 08/22/2020   HCT 45.0 08/22/2020   MCV 91.3 08/22/2020   MCH 28.4 08/22/2020   PLT 265 08/22/2020   MCHC 31.1 08/22/2020   RDW 15.3 08/22/2020   LYMPHSABS 1.7 08/22/2020   MONOABS 0.4 08/22/2020   EOSABS 0.2 08/22/2020   BASOSABS 0.0 16/01/9603     Last metabolic panel Lab Results  Component Value Date   NA 141 08/22/2020   K 3.9 08/22/2020   CL 108 08/22/2020   CO2 24 08/22/2020   BUN 13 08/22/2020   CREATININE 0.79 08/22/2020   GLUCOSE 125 (H) 08/22/2020   GFRNONAA >60 08/22/2020   GFRAA >60 08/26/2019   CALCIUM 9.0 08/22/2020   PROT 6.6 08/22/2020   ALBUMIN 3.6 08/22/2020   BILITOT 0.7 08/22/2020   ALKPHOS 95 08/22/2020   AST 32 08/22/2020   ALT 23 08/22/2020   ANIONGAP 9 08/22/2020    CBG (last 3)  Recent Labs    08/25/20 2337 08/26/20 0420 08/26/20 1052  GLUCAP 118* 90 79     GFR: Estimated Creatinine Clearance: 48.3 mL/min (by C-G formula based on SCr of 0.79 mg/dL).  Coagulation Profile: No results for input(s): INR, PROTIME in the last 168 hours.  Recent Results (from the past 240 hour(s))  SARS CORONAVIRUS 2 (TAT 6-24 HRS) Nasopharyngeal Nasopharyngeal Swab     Status: None   Collection Time: 08/22/20 11:13 PM   Specimen: Nasopharyngeal Swab  Result Value Ref Range Status   SARS Coronavirus 2 NEGATIVE NEGATIVE Final    Comment: (NOTE) SARS-CoV-2 target nucleic acids are NOT DETECTED.  The SARS-CoV-2 RNA is generally detectable in upper and lower respiratory specimens during the acute phase of infection. Negative results do not preclude SARS-CoV-2 infection, do not rule out co-infections with other pathogens, and should not be used as the sole basis for treatment or other patient management  decisions. Negative results must be combined with clinical observations, patient history, and epidemiological information. The expected result is Negative.  Fact Sheet for Patients: SugarRoll.be  Fact Sheet for Healthcare Providers: https://www.woods-mathews.com/  This test is not yet approved or cleared by the [place] FDA and  has been authorized for detection and/or diagnosis of SARS-CoV-2 by FDA under an Emergency Use Authorization (EUA). This EUA will remain  in effect (meaning this test can be used) for the duration of the COVID-19 declaration under Se ction 564(b)(1) of the Act, 21 U.S.C. section 360bbb-3(b)(1), unless the authorization is terminated or revoked sooner.  Performed at [facility], [place] [address] [place], [place] [address]   Urine culture     Status: None   Collection Time: 08/23/20  7:00 AM   Specimen: Urine, Catheterized  Result Value Ref Range Status   Specimen Description URINE, RANDOM  Final   Special Requests NONE  Final   Culture   Final    NO GROWTH Performed at [facility], [place] [address], [place], [place] [address]    Report Status 08/24/2020 FINAL  Final  Radiology Studies: No results found.      Scheduled Meds: . acetaminophen  1,000 mg Oral TID  . atorvastatin  80 mg Oral QPM  . carvedilol  3.125 mg Oral BID  . Chlorhexidine Gluconate Cloth  6 each Topical Daily  . clopidogrel  75 mg Oral QPM  . divalproex  250 mg Oral QPM  . donepezil  10 mg Oral QPM  . escitalopram  20 mg Oral QPM  . ezetimibe  10 mg Oral QPM  . hydrocortisone  15 mg Oral QPM  . insulin aspart  0-5 Units Subcutaneous QHS  . insulin aspart  0-9 Units Subcutaneous TID WC  . insulin detemir  10 Units Subcutaneous QPM  . levothyroxine  125 mcg Oral Q0600  . losartan  50 mg Oral QPM  . polyethylene glycol  17 g Oral Daily  . rivaroxaban  10 mg Oral Daily   Continuous Infusions:   LOS: 3  days     Dana Allan MD [facility] 08/26/2020, 4:00 PM  If 7PM-7AM, please contact night-coverage [email or web address]

## 2020-08-27 LAB — GLUCOSE, CAPILLARY
Glucose-Capillary: 152 mg/dL — ABNORMAL HIGH (ref 70–99)
Glucose-Capillary: 115 mg/dL — ABNORMAL HIGH (ref 70–99)
Glucose-Capillary: 109 mg/dL — ABNORMAL HIGH (ref 70–99)
Glucose-Capillary: 148 mg/dL — ABNORMAL HIGH (ref 70–99)
Glucose-Capillary: 134 mg/dL — ABNORMAL HIGH (ref 70–99)

## 2020-08-27 NOTE — Progress Notes (Addendum)
PROGRESS NOTE    Alyssa [name]  VHQ:[id number] DOB: 07-Nov-1935 DOA: 08/22/2020 PCP: Ernestene Kiel, MD   Brief Narrative: Alyssa [name] is a 85 y.o. female with a history of anemia, CAD, chronic diastolic heart failure, hypertension, hyperlipidemia, hypothyroidism, PVD, diabetes mellitus type 2, dementia.  Patient presented secondary to back pain from multiple falls.  CT scan on admission was significant for L1 compression fracture.  She is also noted to have hematuria as an outpatient, however urinalysis appears unremarkable on admission.  CT abdomen pelvis without evidence of kidney stone.  While admitted, TSH resulted significantly elevated at 98.9 likely secondary to nonadherence with Synthroid dosing.  Likely SNF discharge when medically ready.   Assessment & Plan:   Principal Problem:   Lumbar compression fracture (HCC) Active Problems:   Hypothyroid   DM type 2 (diabetes mellitus, type 2) (HCC)   Essential hypertension   CAD (coronary artery disease)   L1 compression fracture Secondary to fall.  Patient was significant pain has affected her ability to mobilize and ambulate. Neurosurgery consulted and recommend no surgical management at this point but to consider IR consult and acrylic balloon kyphoplasty if pain becomes a significant issue. -Continue oxycodone as needed  Hyperlipidemia Patient is on Lipitor and Zetia -Continue Lipitor   Primary hypertension -Continue Coreg, losartan  Diabetes mellitus, type II Patient is on metformin, glimepiride and Levemir as an outpatient.  Hemoglobin A1c on admission of 9.3%. -Levemir 10 units qhs -Sliding scale insulin  Adrenal sufficiency Confirmed with daughter. Patient is on Cortef but prescribed at night. Blood pressure elevated. -Continue home hydrocortisone 15 mg  Hypothyroidism Patient is on Synthroid 125 mcg daily.  Upon discussion with daughter, does not.  She is adherent with her regimen.  TSH on admission of  98.9. -Continue Synthroid 125 mcg daily  Dementia with delirium Patient has had worsening delirium as an outpatient recently since her falls.  No focal neurological deficits.  Patient has been hard to manage by family members.  While coming up from the ED, patient given Geodon and Ativan.  Initial concern for possible UTI, however her urinalysis is unremarkable and urine culture was significant for no growth.  Patient is on Depakote ER, Aricept, Lexapro as an outpatient -Continue Depakote ER, Aricept, Lexapro -Delirium precautions, try to treat patient's needs rather than behavior  Gross hematuria Mentioned by family but not seen while admitted.  Urinalysis without any RBCs or hemoglobin.  CT scan without structural abnormalities or evidence of kidney stone.  Urine culture still pending. -Follow-up urine culture  Acute urinary retention Patient required foley catheter insertion on 5/14 which was removed by the patient on 5/15. Retention appears to be resolved, however.   DVT prophylaxis: Xarelto prophylaxis dosing Code Status:   Code Status: Full Code Family Communication: Daughter on telephone Disposition Plan: Discharge likely to SNF pending bed vs home depending on patient family.   Consultants:   Neurosurgery  Procedures:   None  Antimicrobials:  None   Subjective: No concerns today.  Objective: Vitals:   08/26/20 1500 08/26/20 1949 08/27/20 0430 08/27/20 0918  BP: 121/72 (!) 159/79 (!) 143/63 (!) 147/57  Pulse: 91 80 64 65  Resp: 19 17 16 17   Temp: 97.6 F (36.4 C) (!) 97.5 F (36.4 C) 98.6 F (37 C) 98.7 F (37.1 C)  TempSrc: Oral Oral Oral Oral  SpO2: 99% 96% 95% 99%  Weight:      Height:        Intake/Output Summary (  Last 24 hours) at 08/27/2020 1306 Last data filed at 08/26/2020 1359 Gross per 24 hour  Intake 240 ml  Output --  Net 240 ml   Filed Weights   08/25/20 1300  Weight: 65.8 kg    Examination:  General exam: Appears calm and  comfortable Respiratory system: Clear to auscultation. Respiratory effort normal. Cardiovascular system: S1 & S2 heard, RRR. Gastrointestinal system: Abdomen is nondistended, soft and nontender. No organomegaly or masses felt. Normal bowel sounds heard. Central nervous system: Alert. No focal neurological deficits. Musculoskeletal: No edema. No calf tenderness Skin: No cyanosis. No rashes Psychiatry: Judgement and insight appear normal. Mood & affect appropriate.     Data Reviewed: I have personally reviewed following labs and imaging studies  CBC Lab Results  Component Value Date   WBC 6.6 08/22/2020   RBC 4.93 08/22/2020   HGB 14.0 08/22/2020   HCT 45.0 08/22/2020   MCV 91.3 08/22/2020   MCH 28.4 08/22/2020   PLT 265 08/22/2020   MCHC 31.1 08/22/2020   RDW 15.3 08/22/2020   LYMPHSABS 1.7 08/22/2020   MONOABS 0.4 08/22/2020   EOSABS 0.2 08/22/2020   BASOSABS 0.0 10/08/9483     Last metabolic panel Lab Results  Component Value Date   NA 141 08/22/2020   K 3.9 08/22/2020   CL 108 08/22/2020   CO2 24 08/22/2020   BUN 13 08/22/2020   CREATININE 0.79 08/22/2020   GLUCOSE 125 (H) 08/22/2020   GFRNONAA >60 08/22/2020   GFRAA >60 08/26/2019   CALCIUM 9.0 08/22/2020   PROT 6.6 08/22/2020   ALBUMIN 3.6 08/22/2020   BILITOT 0.7 08/22/2020   ALKPHOS 95 08/22/2020   AST 32 08/22/2020   ALT 23 08/22/2020   ANIONGAP 9 08/22/2020    CBG (last 3)  Recent Labs    08/26/20 1952 08/27/20 0701 08/27/20 1228  GLUCAP 181* 115* 109*     GFR: Estimated Creatinine Clearance: 48.3 mL/min (by C-G formula based on SCr of 0.79 mg/dL).  Coagulation Profile: No results for input(s): INR, PROTIME in the last 168 hours.  Recent Results (from the past 240 hour(s))  SARS CORONAVIRUS 2 (TAT 6-24 HRS) Nasopharyngeal Nasopharyngeal Swab     Status: None   Collection Time: 08/22/20 11:13 PM   Specimen: Nasopharyngeal Swab  Result Value Ref Range Status   SARS Coronavirus 2 NEGATIVE  NEGATIVE Final    Comment: (NOTE) SARS-CoV-2 target nucleic acids are NOT DETECTED.  The SARS-CoV-2 RNA is generally detectable in upper and lower respiratory specimens during the acute phase of infection. Negative results do not preclude SARS-CoV-2 infection, do not rule out co-infections with other pathogens, and should not be used as the sole basis for treatment or other patient management decisions. Negative results must be combined with clinical observations, patient history, and epidemiological information. The expected result is Negative.  Fact Sheet for Patients: SugarRoll.be  Fact Sheet for Healthcare Providers: https://www.woods-mathews.com/  This test is not yet approved or cleared by the [place] FDA and  has been authorized for detection and/or diagnosis of SARS-CoV-2 by FDA under an Emergency Use Authorization (EUA). This EUA will remain  in effect (meaning this test can be used) for the duration of the COVID-19 declaration under Se ction 564(b)(1) of the Act, 21 U.S.C. section 360bbb-3(b)(1), unless the authorization is terminated or revoked sooner.  Performed at [facility], [place] [address], [place], [place] [address]   Urine culture     Status: None   Collection Time: 08/23/20  7:00 AM   Specimen: Urine, Catheterized  Result Value Ref Range Status   Specimen Description URINE, RANDOM  Final   Special Requests NONE  Final   Culture   Final    NO GROWTH Performed at [facility], [address] [place], [place] [address]    Report Status 08/24/2020 FINAL  Final        Radiology Studies: No results found.      Scheduled Meds: . acetaminophen  1,000 mg Oral TID  . atorvastatin  80 mg Oral QPM  . carvedilol  3.125 mg Oral BID  . Chlorhexidine Gluconate Cloth  6 each Topical Daily  . clopidogrel  75 mg Oral QPM  . divalproex  250 mg Oral QPM  . donepezil  10 mg Oral QPM  . escitalopram   20 mg Oral QPM  . ezetimibe  10 mg Oral QPM  . hydrocortisone  15 mg Oral QPM  . insulin aspart  0-5 Units Subcutaneous QHS  . insulin aspart  0-9 Units Subcutaneous TID WC  . insulin detemir  10 Units Subcutaneous QPM  . levothyroxine  125 mcg Oral Q0600  . losartan  50 mg Oral QPM  . polyethylene glycol  17 g Oral Daily  . rivaroxaban  10 mg Oral Daily   Continuous Infusions:   LOS: 4 days     Cordelia Poche, MD [facility] 08/27/2020, 1:06 PM  If 7PM-7AM, please contact night-coverage [email or web address]

## 2020-08-27 NOTE — Progress Notes (Signed)
Physical Therapy Treatment Patient Details Name: Alyssa [name] MRN: [id number] DOB: 01-18-1936 Today's Date: 08/27/2020    History of Present Illness The pt is an 85 yo female presenting 5/11 with c/o back pain that has been ongoing for 2 weeks since a fall in April. Upon work-up, pt found to have subacute L1 compression fx, and is presenting with acute delirium. PMH includes: CAD, NSTEMI x2, CHF, HLD, HTN, dementia, PVD, DM II, and adrenal insufficiency.    PT Comments    Pt making remarkable progress from a functional standpoint.  She was able to progress to gt training in halls.  Pt is more confused and only oriented to herself.  Despite re-orientation she remains to be confused.  Will inform supervising PT of need for change in recommendations based on patient progress.     Follow Up Recommendations  Home health PT;Supervision/Assistance - 24 hour     Equipment Recommendations  Wheelchair (measurements PT);Wheelchair cushion (measurements PT)    Recommendations for Other Services       Precautions / Restrictions Precautions Precautions: Back;Fall Precaution Comments: back for comfort, pt with x2 recent falls at home Restrictions Weight Bearing Restrictions: No    Mobility  Bed Mobility Overal bed mobility: Needs Assistance Bed Mobility: Rolling;Sidelying to Sit   Sidelying to sit: Min assist            Transfers Overall transfer level: Needs assistance Equipment used: Rolling walker (2 wheeled) Transfers: Sit to/from Stand Sit to Stand: Min guard;+2 safety/equipment         General transfer comment: Cues for hand placement to and from seated surface.  Performed from multiple surfaces this session.  Ambulation/Gait Ambulation/Gait assistance: Min assist Gait Distance (Feet): 60 Feet (+ 12 ft) Assistive device: Rolling walker (2 wheeled) (youth height) Gait Pattern/deviations: Step-through pattern;Trunk flexed;Staggering right;Narrow base of  support;Decreased stride length     General Gait Details: Pt with R lateral lean to the R side.  Pt continues to require assistance to redirect pathway of RW and maintain position with in RW.   Stairs             Wheelchair Mobility    Modified Rankin (Stroke Patients Only)       Balance   Sitting-balance support: No upper extremity supported Sitting balance-[name]: Fair       Standing balance-[name]: Poor Standing balance comment: heavy reliance on UE support to maintain standing.                            Cognition Arousal/Alertness: Lethargic Behavior During Therapy: Flat affect Overall Cognitive Status: History of cognitive impairments - at baseline Area of Impairment: Problem solving;Following commands;Orientation;Memory;Safety/judgement                 Orientation Level: Disoriented to;Place;Time;Situation (did have trouble recalling her bday)   Memory: Decreased recall of precautions;Decreased short-term memory (does not remember last PT session.) Following Commands: Follows one step commands inconsistently;Follows one step commands with increased time     Problem Solving: Slow processing;Decreased initiation;Difficulty sequencing;Requires verbal cues;Requires tactile cues General Comments: pt with history of dementia, oriented only to person today. She was able to recognize her husband when he came and recall his name.  Pt required multimodal cues throughout session.      Exercises      General Comments        Pertinent Vitals/Pain Pain Assessment: Faces Faces Pain Scale: Hurts little  more Pain Location: back with bed mobility/transition Pain Descriptors / Indicators: Discomfort;Grimacing;Moaning Pain Intervention(s): Repositioned    Home Living                      Prior Function            PT Goals (current goals can now be found in the care plan section) Acute Rehab PT Goals Patient Stated Goal: to  lay back down Potential to Achieve Goals: Fair Progress towards PT goals: Progressing toward goals    Frequency    Min 5X/week      PT Plan Discharge plan needs to be updated    Co-evaluation PT/OT/SLP Co-Evaluation/Treatment: Yes Reason for Co-Treatment: Complexity of the patient's impairments (multi-system involvement);For patient/therapist safety PT goals addressed during session: Mobility/safety with mobility        AM-PAC PT "6 Clicks" Mobility   Outcome Measure  Help needed turning from your back to your side while in a flat bed without using bedrails?: A Little Help needed moving from lying on your back to sitting on the side of a flat bed without using bedrails?: A Little Help needed moving to and from a bed to a chair (including a wheelchair)?: A Little Help needed standing up from a chair using your arms (e.g., wheelchair or bedside chair)?: A Little Help needed to walk in hospital room?: A Little Help needed climbing 3-5 steps with a railing? : A Little 6 Click Score: 18    End of Session Equipment Utilized During Treatment: Gait belt Activity Tolerance: Patient tolerated treatment well Patient left: with call bell/phone within reach;in chair;with chair alarm set (with sitter alarm belt in place.) Nurse Communication: Mobility status PT Visit Diagnosis: Other abnormalities of gait and mobility (R26.89);Muscle weakness (generalized) (M62.81);Difficulty in walking, not elsewhere classified (R26.2);Pain Pain - part of body:  (back)     Time: 6789-3810 PT Time Calculation (min) (ACUTE ONLY): 25 min  Charges:  $Gait Training: 8-22 mins                     Erasmo Leventhal , PTA Acute Rehabilitation Services Pager [phone] Office [place] 08/27/2020, 3:35 PM

## 2020-08-27 NOTE — Progress Notes (Signed)
Occupational Therapy Treatment Patient Details Name: Alyssa [name] MRN: [id number] DOB: 03-Apr-1936 Today's Date: 08/27/2020    History of present illness The pt is an 85 yo female presenting 5/11 with c/o back pain that has been ongoing for 2 weeks since a fall in April. Upon work-up, pt found to have subacute L1 compression fx, and is presenting with acute delirium. PMH includes: CAD, NSTEMI x2, CHF, HLD, HTN, dementia, PVD, DM II, and adrenal insufficiency.   OT comments  Itzamara is making great progress towards her goals, oriented to person this session, pleasant for therapy. Malak was min A for bed mobility for trunk management and pain. She transferred sit<>stand several times with close min guard, and competed functional ambulation with rw and Min A for R lateral lean. Pt required max verbal cues for all sequencing and problem solving, able to follow simple one step commands consistently. Pt completed BSC transfer this session with min A for balance and facilitation of movement. Pt's daughter, Stanton Kidney, reported that the family does not want pt to go to SNF at discharge and plan to have assistance for pt 24/7 upon d/c. D/c plan updated, CSW aware. Recommend [place] OT and supervision 24/7. OT following acutely.    Follow Up Recommendations  Home health OT;Supervision/Assistance - 24 hour    Equipment Recommendations  None recommended by OT       Precautions / Restrictions Precautions Precautions: Back;Fall Precaution Comments: back for comfort, pt with x2 recent falls at home Restrictions Weight Bearing Restrictions: No       Mobility Bed Mobility Overal bed mobility: Needs Assistance Bed Mobility: Rolling;Sidelying to Sit Rolling: Min guard Sidelying to sit: Min assist       General bed mobility comments: increased pain with bed mobility    Transfers Overall transfer level: Needs assistance Equipment used: Rolling walker (2 wheeled) Transfers: Sit to/from Stand Sit to Stand: Min  guard;+2 safety/equipment         General transfer comment: +2 for chair follow; max cues for positioning, safety and sequencing    Balance Overall balance assessment: Needs assistance Sitting-balance support: No upper extremity supported Sitting balance-[name]: Fair     Standing balance support: Bilateral upper extremity supported Standing balance-[name]: Poor Standing balance comment: heavy reliance on UE support to maintain standing.                           ADL either performed or assessed with clinical judgement   ADL Overall ADL's : Needs assistance/impaired     Grooming: Minimal assistance;Sitting                   Toilet Transfer: Minimal assistance;RW;BSC;Grab bars           Functional mobility during ADLs: Minimal assistance;Rolling walker (+2 for chair follow) General ADL Comments: Pt groomed while sitting EOB, transferred onto [facility] however had no needs to urinate at this time. requried max vc for all mobility, sequencing and problem solving               Cognition Arousal/Alertness: Lethargic Behavior During Therapy: Flat affect Overall Cognitive Status: History of cognitive impairments - at baseline Area of Impairment: Problem solving;Following commands;Orientation;Memory;Safety/judgement                 Orientation Level: Disoriented to;Place;Time;Situation Current Attention Level: Focused Memory: Decreased recall of precautions;Decreased short-term memory Following Commands: Follows one step commands inconsistently;Follows one step commands with increased  time Safety/Judgement: Decreased awareness of safety;Decreased awareness of deficits Awareness: Intellectual Problem Solving: Slow processing;Decreased initiation;Difficulty sequencing;Requires verbal cues;Requires tactile cues General Comments: pt with history of dementia, oriented only to person today. She was able to recognize her husband and daughter when they  came today.  Pt required multimodal cues throughout session.              General Comments no skin integrity issues noted, oriented only to person this session, recalled 3/4 daughters, recognized husband and daughter    Pertinent Vitals/ Pain       Pain Assessment: Faces Faces Pain Scale: Hurts little more Pain Location: back with bed mobility/transition Pain Descriptors / Indicators: Discomfort;Grimacing;Moaning Pain Intervention(s): Monitored during session         Frequency  Min 2X/week        Progress Toward Goals  OT Goals(current goals can now be found in the care plan section)  Progress towards OT goals: Progressing toward goals  Acute Rehab OT Goals Patient Stated Goal: to go home per family OT Goal Formulation: With patient Time For Goal Achievement: 09/06/20 Potential to Achieve Goals: Fair ADL Goals Pt Will Perform Grooming: with set-up;bed level Pt Will Perform Upper Body Dressing: with set-up;bed level Pt Will Transfer to Toilet: with min assist;stand pivot transfer;bedside commode  Plan Discharge plan needs to be updated    Co-evaluation      Reason for Co-Treatment: Complexity of the patient's impairments (multi-system involvement);Necessary to address cognition/behavior during functional activity;To address functional/ADL transfers;For patient/therapist safety PT goals addressed during session: Mobility/safety with mobility OT goals addressed during session: Proper use of [employer] equipment and DME;ADL's and self-care      AM-PAC OT "6 Clicks" Daily Activity     Outcome Measure   Help from another person eating meals?: A Little Help from another person taking care of personal grooming?: A Lot Help from another person toileting, which includes using toliet, bedpan, or urinal?: A Little Help from another person bathing (including washing, rinsing, drying)?: A Lot Help from another person to put on and taking off regular upper body clothing?: A  Little Help from another person to put on and taking off regular lower body clothing?: A Lot 6 Click Score: 15    End of Session Equipment Utilized During Treatment: Gait belt;Rolling walker  OT Visit Diagnosis: Unsteadiness on feet (R26.81);Other abnormalities of gait and mobility (R26.89);Repeated falls (R29.6);Muscle weakness (generalized) (M62.81);History of falling (Z91.81);Pain Pain - Right/Left:  (back) Pain - part of body:  (back)   Activity Tolerance Patient tolerated treatment well   Patient Left in chair;with chair alarm set;with family/visitor present   Nurse Communication Mobility status;Other (comment) (dc recommendation, pt position, family visiting)        Time: 7989-2119 OT Time Calculation (min): 29 min  Charges: OT General Charges $OT Visit: 1 Visit OT Treatments $Self Care/Home Management : 8-22 mins    Jensen Cheramie A Evart Mcdonnell 08/27/2020, 4:50 PM

## 2020-08-27 NOTE — Plan of Care (Signed)
  Problem: Education: Goal: Knowledge of General Education information will improve Description: Including pain rating scale, medication(s)/side effects and non-pharmacologic comfort measures Outcome: Progressing   Problem: Health Behavior/Discharge Planning: Goal: Ability to manage health-related needs will improve Outcome: Progressing   Problem: Safety: Goal: Non-violent Restraint(s) Outcome: Progressing   

## 2020-08-28 LAB — GLUCOSE, CAPILLARY
Glucose-Capillary: 83 mg/dL (ref 70–99)
Glucose-Capillary: 123 mg/dL — ABNORMAL HIGH (ref 70–99)

## 2020-08-28 MED ORDER — OXYCODONE HCL 5 MG PO TABS: 5.0000 mg | ORAL_TABLET | Freq: Four times a day (QID) | ORAL | 0 refills | Status: DC | PRN

## 2020-08-28 NOTE — Progress Notes (Signed)
Occupational Therapy Treatment Patient Details Name: Alyssa [name] MRN: [id number] DOB: 01/19/1936 Today's Date: 08/28/2020    History of present illness The pt is an 85 yo female presenting 5/11 with c/o back pain that has been ongoing for 2 weeks since a fall in April. Upon work-up, pt found to have subacute L1 compression fx, and is presenting with acute delirium. PMH includes: CAD, NSTEMI x2, CHF, HLD, HTN, dementia, PVD, DM II, and adrenal insufficiency.   OT comments  Alyssa [name] continues to progress towards her goals with plans to dc home today. Alyssa [name] was limited by back pain this session, RN gave meds during session. Alyssa [name] completed bed mobility at a minA level overall. Pt continues to require vc to initiate all transfers and movement. Alyssa [name] pulls with both hands on rw during sit>stand and becomes very confused when corrected, this therapist stabilized rw for safe sit>stand. Pt completed BSC trasnfer with min guard, no output after 2 minutes of sitting. Pt groomed and bathed while sitting EOB with mod A overall. Pt continues to benefit from skilled OT acutely to continue to progress function in ADLs and mobility. D/c plan remains appropriate.     Follow Up Recommendations  Home health OT;Supervision/Assistance - 24 hour    Equipment Recommendations  None recommended by OT       Precautions / Restrictions Precautions Precautions: Back;Fall Precaution Booklet Issued: No Precaution Comments: back for comfort, pt with x2 recent falls at home Restrictions Weight Bearing Restrictions: No       Mobility Bed Mobility Overal bed mobility: Needs Assistance Bed Mobility: Rolling;Sidelying to Sit Rolling: Min guard Sidelying to sit: Min assist   Sit to supine: Max assist;+2 for physical assistance   General bed mobility comments: increased pain with bed mobility    Transfers Overall transfer level: Needs assistance Equipment used: Rolling walker (2 wheeled) Transfers: Sit to/from  Stand Sit to Stand: Min guard         General transfer comment: pt consistently pulls to stand with both hands on rw; caregiver should be educated to stabilize rw prior to pt standing    Balance Overall balance assessment: Needs assistance Sitting-balance support: No upper extremity supported Sitting balance-[name]: Fair     Standing balance support: Bilateral upper extremity supported Standing balance-[name]: Poor           ADL either performed or assessed with clinical judgement   ADL Overall ADL's : Needs assistance/impaired     Grooming: Minimal assistance;Sitting Grooming Details (indicate cue type and reason): requires tactile cues to initiate Upper Body Bathing: Minimal assistance;Sitting   Lower Body Bathing: Maximal assistance;Bed level       Toilet Transfer: Minimal assistance;RW;BSC;Ambulation       Functional mobility during ADLs: Minimal assistance;Rolling walker General ADL Comments: pt groomed and bathed while sitting EOB, requires tactile cues for initiating tasks; transferred to [facility] however no output      Cognition Arousal/Alertness: Awake/alert Behavior During Therapy: Flat affect Overall Cognitive Status: History of cognitive impairments - at baseline Area of Impairment: Problem solving;Following commands;Orientation;Memory;Safety/judgement                 Orientation Level: Disoriented to;Place;Time;Situation Current Attention Level: Focused Memory: Decreased recall of precautions;Decreased short-term memory Following Commands: Follows one step commands inconsistently;Follows one step commands with increased time Safety/Judgement: Decreased awareness of safety;Decreased awareness of deficits Awareness: Intellectual   General Comments: pt with history of dementia, oriented only to person today. She was able to recognize her  husband and daughter when they came today.  Pt required multimodal cues throughout session.               General Comments pt with reddness under each breast, rn notified    Pertinent Vitals/ Pain       Pain Assessment: Faces Faces Pain Scale: Hurts little more Pain Location: back with sitting Pain Descriptors / Indicators: Discomfort;Grimacing;Moaning Pain Intervention(s): Monitored during session         Frequency  Min 2X/week        Progress Toward Goals  OT Goals(current goals can now be found in the care plan section)  Progress towards OT goals: Progressing toward goals  Acute Rehab OT Goals Patient Stated Goal: to go home per family OT Goal Formulation: With patient Time For Goal Achievement: 09/06/20 Potential to Achieve Goals: Fair ADL Goals Pt Will Perform Grooming: with set-up;bed level Pt Will Perform Upper Body Dressing: with set-up;bed level Pt Will Transfer to Toilet: with min assist;stand pivot transfer;bedside commode  Plan Discharge plan remains appropriate       AM-PAC OT "6 Clicks" Daily Activity     Outcome Measure   Help from another person eating meals?: A Little Help from another person taking care of personal grooming?: A Little Help from another person toileting, which includes using toliet, bedpan, or urinal?: A Little Help from another person bathing (including washing, rinsing, drying)?: A Lot Help from another person to put on and taking off regular upper body clothing?: A Little Help from another person to put on and taking off regular lower body clothing?: A Lot 6 Click Score: 16    End of Session Equipment Utilized During Treatment: Gait belt;Rolling walker  OT Visit Diagnosis: Unsteadiness on feet (R26.81);Other abnormalities of gait and mobility (R26.89);Repeated falls (R29.6);Muscle weakness (generalized) (M62.81);History of falling (Z91.81);Pain Pain - Right/Left:  (back) Pain - part of body:  (back)   Activity Tolerance Patient tolerated treatment well;Patient limited by pain   Patient Left in bed;with bed alarm set;with  family/visitor present;with call bell/phone within reach   Nurse Communication Mobility status;Other (comment) (skin integrity)        Time: 3662-9476 OT Time Calculation (min): 18 min  Charges: OT General Charges $OT Visit: 1 Visit OT Treatments $Self Care/Home Management : 8-22 mins     Peggye Poon A Shreyan Hinz 08/28/2020, 11:15 AM

## 2020-08-28 NOTE — Discharge Summary (Signed)
Physician Discharge Summary  [name]:[id number] DOB: 12-01-1935 DOA: 08/22/2020  PCP: Ernestene Kiel, MD  Admit date: 08/22/2020 Discharge date: 08/28/2020  Admitted From: Home Disposition: Home ([facility])  Recommendations for Outpatient Follow-up:  1. Follow up with PCP in 1 week 2. Please follow up on the following pending results: None  Home Health: PT/OT Equipment/Devices: Wheelchair  Discharge Condition: Stable CODE STATUS: Full code Diet recommendation: Dysphagia 3   Brief/Interim Summary:  Admission HPI written by Shela Leff, MD   HPI: Alyssa [name] is a 85 y.o. female with medical history significant of anemia, CAD, chronic diastolic CHF, hypertension, hyperlipidemia, hypothyroidism, PVD, insulin-dependent type 2 diabetes, dementia presented to the ED via EMS for evaluation of back pain. No history could be obtained from the patient given her dementia.  No family available at this time.  Per ED PA's conversation with the patient's daughter: "His daughter is at the bedside and gives history. She states that since patient's fall x2 weeks ago she has been complaining of ongoing back pain. She has not gotten out of bed much at all which is unusual for her. Every time she tries to ambulate she will scream out in pain. Patient has had 2 more falls. She was not seen in the emergency department afterwards for either of them. The last 1 was x4 days ago. With these falls they deny her hitting her head or loss of consciousness. They state her memory has been progressively worsening with her known dementia, no acute changes there. Yesterday patient's other daughter noticed that she had painless hematuria. She has no history of UTI or kidney stones. Patient has also had decreased p.o. intake and not been compliant with taking her medications. Patient lives with her husband and daughters check on her daily."   Hospital course:  L1 compression  fracture Secondary to fall.  Patient was significant pain has affected her ability to mobilize and ambulate. Neurosurgery consulted and recommend no surgical management at this point but to consider IR consult and acrylic balloon kyphoplasty if pain becomes a significant issue. Discharged with oxycodone and home health; declined SNF.  Hyperlipidemia Patient is on Lipitor and Zetia. Continue on discharge.  Primary hypertension Continue Coreg, losartan  Diabetes mellitus, type II Patient is on metformin, glimepiride and Levemir as an outpatient.  Hemoglobin A1c on admission of 9.3%. Continue on discharge.  Adrenal sufficiency Confirmed with daughter. Patient is on Cortef but prescribed at night. Blood pressure elevated. Continue Cortef on discharge.  Hypothyroidism Patient is on Synthroid 125 mcg daily.  Upon discussion with daughter, does not.  She is adherent with her regimen.  TSH on admission of 98.9. Continue Synthroid on discharge.  Dementia with delirium Patient has had worsening delirium as an outpatient recently since her falls.  No focal neurological deficits.  Patient has been hard to manage by family members.  While coming up from the ED, patient given Geodon and Ativan.  Initial concern for possible UTI, however her urinalysis is unremarkable and urine culture was significant for no growth.  Patient is on Depakote ER, Aricept, Lexapro as an outpatient. Continue on discharge.  Gross hematuria Mentioned by family but not seen while admitted.  Urinalysis without any RBCs or hemoglobin.  CT scan without structural abnormalities or evidence of kidney stone.  Urine culture with no growth.  Acute urinary retention Patient required foley catheter insertion on 5/14 which was removed by the patient on 5/15. Retention appears to be resolved, however.  Discharge Diagnoses:  Principal Problem:   Lumbar compression fracture ([place]) Active Problems:   Hypothyroid   DM type 2  (diabetes mellitus, type 2) ([place])   Essential hypertension   CAD (coronary artery disease)    Discharge Instructions   Allergies as of 08/28/2020      Reactions   Tetanus Toxoid, Adsorbed Swelling, Rash   Site of injection swells      Medication List    STOP taking these medications   ibuprofen 200 MG tablet Commonly known as: ADVIL   naproxen 500 MG EC tablet Commonly known as: EC NAPROSYN   naproxen sodium 220 MG tablet Commonly known as: ALEVE     TAKE these medications   atorvastatin 80 MG tablet Commonly known as: LIPITOR TAKE 1 TABLET BY MOUTH ONCE DAILY. What changed: when to take this   carvedilol 3.125 MG tablet Commonly known as: COREG TAKE 1 TABLET BY MOUTH TWICE DAILY What changed: when to take this   clopidogrel 75 MG tablet Commonly known as: PLAVIX TAKE 1 TABLET BY MOUTH ONCE DAILY. What changed: when to take this   divalproex 250 MG 24 hr tablet Commonly known as: DEPAKOTE ER Take 250 mg by mouth every evening.   donepezil 10 MG tablet Commonly known as: ARICEPT Take 10 mg by mouth every evening.   escitalopram 20 MG tablet Commonly known as: LEXAPRO Take 1 tablet (20 mg total) by mouth every evening.   fluticasone 50 MCG/ACT nasal spray Commonly known as: FLONASE Place 2 sprays into both nostrils daily as needed for allergies.   furosemide 20 MG tablet Commonly known as: LASIX TAKE 1 TABLET BY MOUTH ONCE DAILY. What changed:   how much to take  when to take this   glimepiride 4 MG tablet Commonly known as: AMARYL Take 4 mg by mouth every evening.   hydrocortisone 5 MG tablet Commonly known as: CORTEF Take 15 mg by mouth every evening.   Levemir FlexTouch 100 UNIT/ML FlexPen Generic drug: insulin detemir Inject 20 Units into the skin every evening.   levothyroxine 125 MCG tablet Commonly known as: SYNTHROID Take 1 tablet by mouth daily before breakfast.   losartan 50 MG tablet Commonly known as: COZAAR Take 50 mg by  mouth every evening.   meclizine 25 MG tablet Commonly known as: ANTIVERT Take 25 mg by mouth 4 (four) times daily as needed for dizziness.   metFORMIN 500 MG 24 hr tablet Commonly known as: GLUCOPHAGE-XR Take 1,000 mg by mouth every evening.   methocarbamol 500 MG tablet Commonly known as: ROBAXIN Take 1 tablet (500 mg total) by mouth 2 (two) times daily as needed for muscle spasms.   nitroGLYCERIN 0.4 MG SL tablet Commonly known as: NITROSTAT Place 0.4 mg under the tongue every 5 (five) minutes as needed for chest pain. Reported on 10/04/2015   oxyCODONE 5 MG immediate release tablet Commonly known as: Oxy IR/ROXICODONE Take 1 tablet (5 mg total) by mouth every 6 (six) hours as needed for moderate pain.   polyethylene glycol 17 g packet Commonly known as: MIRALAX / GLYCOLAX Take 17 g by mouth daily as needed for mild constipation. Reported on 10/04/2015   TRUEplus Insulin Syringe 31G X 5/16" 0.5 ML Misc Generic drug: Insulin Syringe-Needle U-100   Zetia 10 MG tablet Generic drug: ezetimibe TAKE 1 TABLET BY MOUTH EVERY DAY What changed:   how much to take  when to take this            Durable  Medical Equipment  (From admission, onward)         Start     Ordered   08/28/20 0749  For home use only DME standard manual wheelchair with seat cushion  Once       Comments: Patient suffers from L1 compression fracture and dementia which impairs their ability to perform daily activities like bathing, dressing, feeding, grooming, and toileting in the home.  A cane, crutch, or walker will not resolve issue with performing activities of daily living. A wheelchair will allow patient to safely perform daily activities. Patient can safely propel the wheelchair in the home or has a caregiver who can provide assistance. Length of need Lifetime. Accessories: elevating leg rests (ELRs), wheel locks, extensions and anti-tippers.   08/28/20 0750          Follow-up Information     Dr. Ernestene Kiel Follow up.   Why: Please arrange post hospital follow up appointment with PCP within 7-10 post discharge Contact information: Address: [address] [place], [place] [address] Phone: [phone]        [facility] Follow up.   Specialty: [place] Why: home health services will be provided by [facility] , start of care by [facility], 5/19. Contact information: [address] [place] [address] [phone]              Allergies  Allergen Reactions  . Tetanus Toxoid, Adsorbed Swelling and Rash    Site of injection swells    Consultations:  Neurosurgery   Procedures/Studies: DG Thoracic Spine W/Swimmers  Result Date: 08/08/2020 CLINICAL DATA:  Trauma with back pain. Pain after fall. EXAM: THORACIC SPINE - 3 VIEWS COMPARISON:  None. FINDINGS: Mild levo scoliotic curvature of the upper lumbar spine. Bones are diffusely under mineralized. Question of segmentation anomaly involving T4-T5. Multilevel endplate spurring. No evidence of acute fracture no paravertebral soft tissue abnormality to suggest fracture. IMPRESSION: 1. No fracture or subluxation of the thoracic spine. 2. Question of segmentation anomaly involving T4-T5. 3. Osteopenia/osteoporosis. Electronically Signed   By: Keith Rake M.D.   On: 08/08/2020 22:45   DG Lumbar Spine Complete  Result Date: 08/08/2020 CLINICAL DATA:  Trauma with back pain.  Pain after fall. EXAM: LUMBAR SPINE - COMPLETE 4+ VIEW COMPARISON:  None. FINDINGS: Bones are diffusely under mineralized. Trace anterolisthesis of L5 on S1. Alignment is otherwise normal. No acute fracture. Vertebral body heights are preserved. S1 appears partially lumbarized. Facet hypertrophy at L5-S1. Disc spaces are preserved. Sacroiliac joints are congruent. IMPRESSION: 1. No fracture of the lumbar spine. 2. Mild degenerative change with facet hypertrophy at L5-S1. Electronically Signed   By: Keith Rake  M.D.   On: 08/08/2020 22:43   CT L-SPINE NO CHARGE  Result Date: 08/22/2020 CLINICAL DATA:  Back pain for 2 weeks after fall in April. EXAM: CT LUMBAR SPINE WITHOUT CONTRAST TECHNIQUE: Multidetector CT imaging of the lumbar spine was performed without intravenous contrast administration. Multiplanar CT image reconstructions were also generated. COMPARISON:  Lumbar radiograph 08/08/2020 FINDINGS: Segmentation: 5 lumbar type vertebrae. Alignment: No listhesis. Vertebrae: Mild L1 superior endplate compression fracture likely subacute. There is approximately 30% loss of height centrally. Mild buckling of the posterior cortex without significant mass effect on the spinal canal. No involvement of the posterior elements. No additional fracture. The remaining vertebral body heights are preserved. No sacral fracture. Paraspinal and other soft tissues: No significant paraspinal hemorrhage. Aortic and branch atherosclerosis. Abdominal structures assessed on concurrent  abdominopelvic CT, reported separately. Disc levels: Mild T12-L1 disc space narrowing and vacuum phenomenon. Disc spaces are preserved. No significant mass effect on the spinal canal related to L1 fracture. There is no canal narrowing. Facet hypertrophy at L5-S1. IMPRESSION: Mild L1 superior endplate compression fracture with approximately 30% loss of height centrally. Mild buckling of the posterior cortex without significant mass effect on the spinal canal. Findings are likely subacute. Electronically Signed   By: Keith Rake M.D.   On: 08/22/2020 16:37   CT Renal Stone Study  Result Date: 08/22/2020 CLINICAL DATA:  Back pain for several weeks following recent fall with hematuria, initial encounter EXAM: CT ABDOMEN AND PELVIS WITHOUT CONTRAST TECHNIQUE: Multidetector CT imaging of the abdomen and pelvis was performed following the standard protocol without IV contrast. COMPARISON:  08/08/2020 FINDINGS: Lower chest: No acute abnormality.  Hepatobiliary: Liver is within normal limits. Gallbladder is well distended with dependent gallstones. No ductal dilatation is seen. Pancreas: Unremarkable. No pancreatic ductal dilatation or surrounding inflammatory changes. Spleen: Normal in size without focal abnormality. Adrenals/Urinary Tract: Adrenal glands are within normal limits. Kidneys show vague hypodensities likely representing small cysts. No renal calculi or obstructive changes are noted. The ureters are within normal limits to the level of the urinary bladder. Bladder is well distended. Stomach/Bowel: Scattered diverticular changes noted without evidence of diverticulitis. Scattered fecal material is noted throughout the colon. No obstructive or inflammatory changes are seen. The appendix is within normal limits. No inflammatory changes are seen. Small bowel and stomach are unremarkable. Vascular/Lymphatic: Aortic atherosclerosis. No enlarged abdominal or pelvic lymph nodes. Reproductive: Status post hysterectomy. No adnexal masses. Other: No abdominal wall hernia or abnormality. No abdominopelvic ascites. Musculoskeletal: Degenerative changes of lumbar spine are noted. Partial lumbarization of S1 is seen. There is a superior endplate compression deformity at L1 identified which was not seen on the recent plain film examination and may have been related to the recent fall. No significant retropulsion is noted. No other focal bony abnormality is noted. IMPRESSION: L1 compression fracture not present on prior plain film examination and likely related to the recent injury. Diverticular change without diverticulitis. Cholelithiasis without complicating factors. No other focal abnormality is noted. Electronically Signed   By: Inez Catalina M.D.   On: 08/22/2020 16:34     Subjective: No concerns today.  Discharge Exam: Vitals:   08/28/20 0731 08/28/20 1300  BP: (!) 103/58 (!) 108/54  Pulse: 61 64  Resp: 16 16  Temp: 97.8 F (36.6 C) 97.8 F  (36.6 C)  SpO2: 96% 96%   Vitals:   08/27/20 2014 08/28/20 0325 08/28/20 0731 08/28/20 1300  BP: (!) 145/76 (!) 157/80 (!) 103/58 (!) 108/54  Pulse: 85 67 61 64  Resp: 18 16 16 16   Temp: 98.3 F (36.8 C) 97.6 F (36.4 C) 97.8 F (36.6 C) 97.8 F (36.6 C)  TempSrc: Oral Oral Oral Oral  SpO2: 95% 97% 96% 96%  Weight:      Height:        General: Pt is alert, awake, not in acute distress Cardiovascular: RRR, S1/S2 +, no rubs, no gallops Respiratory: CTA bilaterally, no wheezing, no rhonchi Abdominal: Soft, NT, ND, bowel sounds + Extremities: no edema, no cyanosis    The results of significant diagnostics from this hospitalization (including imaging, microbiology, ancillary and laboratory) are listed below for reference.     Microbiology: Recent Results (from the past 240 hour(s))  SARS CORONAVIRUS 2 (TAT 6-24 HRS) Nasopharyngeal Nasopharyngeal Swab  Status: None   Collection Time: 08/22/20 11:13 PM   Specimen: Nasopharyngeal Swab  Result Value Ref Range Status   SARS Coronavirus 2 NEGATIVE NEGATIVE Final    Comment: (NOTE) SARS-CoV-2 target nucleic acids are NOT DETECTED.  The SARS-CoV-2 RNA is generally detectable in upper and lower respiratory specimens during the acute phase of infection. Negative results do not preclude SARS-CoV-2 infection, do not rule out co-infections with other pathogens, and should not be used as the sole basis for treatment or other patient management decisions. Negative results must be combined with clinical observations, patient history, and epidemiological information. The expected result is Negative.  Fact Sheet for Patients: [facility].be  Fact Sheet for Healthcare Providers: https://www.woods-mathews.com/  This test is not yet approved or cleared by the [place] FDA and  has been authorized for detection and/or diagnosis of SARS-CoV-2 by FDA under an Emergency Use Authorization  (EUA). This EUA will remain  in effect (meaning this test can be used) for the duration of the COVID-19 declaration under Se ction 564(b)(1) of the Act, 21 U.S.C. section 360bbb-3(b)(1), unless the authorization is terminated or revoked sooner.  Performed at [facility], [place] [address], [place], [place] [address]   Urine culture     Status: None   Collection Time: 08/23/20  7:00 AM   Specimen: Urine, Catheterized  Result Value Ref Range Status   Specimen Description URINE, RANDOM  Final   Special Requests NONE  Final   Culture   Final    NO GROWTH Performed at [facility], [place] [address], [place], [place] [address]    Report Status 08/24/2020 FINAL  Final     Labs: BNP (last 3 results) No results for input(s): BNP in the last 8760 hours. Basic Metabolic Panel: Recent Labs  Lab 08/22/20 1552  NA 141  K 3.9  CL 108  CO2 24  GLUCOSE 125*  BUN 13  CREATININE 0.79  CALCIUM 9.0   Liver Function Tests: Recent Labs  Lab 08/22/20 1552  AST 32  ALT 23  ALKPHOS 95  BILITOT 0.7  PROT 6.6  ALBUMIN 3.6   No results for input(s): LIPASE, AMYLASE in the last 168 hours. No results for input(s): AMMONIA in the last 168 hours. CBC: Recent Labs  Lab 08/22/20 1552  WBC 6.6  NEUTROABS 4.3  HGB 14.0  HCT 45.0  MCV 91.3  PLT 265   Cardiac Enzymes: No results for input(s): CKTOTAL, CKMB, CKMBINDEX, TROPONINI in the last 168 hours. BNP: Invalid input(s): POCBNP CBG: Recent Labs  Lab 08/27/20 1228 08/27/20 1620 08/27/20 2014 08/28/20 0632 08/28/20 1151  GLUCAP 109* 148* 134* 83 123*   D-Dimer No results for input(s): DDIMER in the last 72 hours. Hgb A1c No results for input(s): HGBA1C in the last 72 hours. Lipid Profile No results for input(s): CHOL, HDL, LDLCALC, TRIG, CHOLHDL, LDLDIRECT in the last 72 hours. Thyroid function studies No results for input(s): TSH, T4TOTAL, T3FREE, THYROIDAB in the last 72 hours.  Invalid input(s): FREET3 Anemia  work up No results for input(s): VITAMINB12, FOLATE, FERRITIN, TIBC, IRON, RETICCTPCT in the last 72 hours. Urinalysis    Component Value Date/Time   COLORURINE YELLOW 08/23/2020 0740   APPEARANCEUR CLEAR 08/23/2020 0740   LABSPEC 1.013 08/23/2020 0740   PHURINE 5.0 08/23/2020 0740   GLUCOSEU NEGATIVE 08/23/2020 0740   HGBUR NEGATIVE 08/23/2020 0740   BILIRUBINUR NEGATIVE 08/23/2020 0740   KETONESUR NEGATIVE 08/23/2020 0740   PROTEINUR NEGATIVE 08/23/2020 0740  NITRITE NEGATIVE 08/23/2020 0740   LEUKOCYTESUR NEGATIVE 08/23/2020 0740   Sepsis Labs Invalid input(s): PROCALCITONIN,  WBC,  LACTICIDVEN Microbiology Recent Results (from the past 240 hour(s))  SARS CORONAVIRUS 2 (TAT 6-24 HRS) Nasopharyngeal Nasopharyngeal Swab     Status: None   Collection Time: 08/22/20 11:13 PM   Specimen: Nasopharyngeal Swab  Result Value Ref Range Status   SARS Coronavirus 2 NEGATIVE NEGATIVE Final    Comment: (NOTE) SARS-CoV-2 target nucleic acids are NOT DETECTED.  The SARS-CoV-2 RNA is generally detectable in upper and lower respiratory specimens during the acute phase of infection. Negative results do not preclude SARS-CoV-2 infection, do not rule out co-infections with other pathogens, and should not be used as the sole basis for treatment or other patient management decisions. Negative results must be combined with clinical observations, patient history, and epidemiological information. The expected result is Negative.  Fact Sheet for Patients: [facility].be  Fact Sheet for Healthcare Providers: https://www.woods-mathews.com/  This test is not yet approved or cleared by the [place] FDA and  has been authorized for detection and/or diagnosis of SARS-CoV-2 by FDA under an Emergency Use Authorization (EUA). This EUA will remain  in effect (meaning this test can be used) for the duration of the COVID-19 declaration under Se ction 564(b)(1)  of the Act, 21 U.S.C. section 360bbb-3(b)(1), unless the authorization is terminated or revoked sooner.  Performed at [facility], [place] [address] [place], [place] [address]   Urine culture     Status: None   Collection Time: 08/23/20  7:00 AM   Specimen: Urine, Catheterized  Result Value Ref Range Status   Specimen Description URINE, RANDOM  Final   Special Requests NONE  Final   Culture   Final    NO GROWTH Performed at [facility], [place] [address], [place], [place] [address]    Report Status 08/24/2020 FINAL  Final     Time coordinating discharge: 35 minutes  SIGNED:   Cordelia Poche, MD [facility] 08/28/2020, 4:23 PM

## 2020-08-28 NOTE — Progress Notes (Addendum)
Physical Therapy Treatment Patient Details Name: Alyssa [name] MRN: [id number] DOB: 29-Aug-1935 Today's Date: 08/28/2020    History of Present Illness The pt is an 85 yo female presenting 5/11 with c/o back pain that has been ongoing for 2 weeks since a fall in April. Upon work-up, pt found to have subacute L1 compression fx, and is presenting with acute delirium. PMH includes: CAD, NSTEMI x2, CHF, HLD, HTN, dementia, PVD, DM II, and adrenal insufficiency.    PT Comments    Pt supine in bed on arrival this session.  Pt remains only oriented to her self.  Performed LE exercises and able to progress gt distance this am.  Pt continues to be on track to return home with 24 hr support.  Pt would benefit from this familiar environment due to her dementia.  Issued HEP for home use, falls precautions handout and spinal precautions handout for comfort with mobility.     Follow Up Recommendations  Home health PT;Supervision/Assistance - 24 hour     Equipment Recommendations  Wheelchair (measurements PT);Wheelchair cushion (measurements PT)    Recommendations for Other Services       Precautions / Restrictions Precautions Precautions: Back;Fall Precaution Booklet Issued: No Precaution Comments: back for comfort, pt with x2 recent falls at home Restrictions Weight Bearing Restrictions: No    Mobility  Bed Mobility Overal bed mobility: Needs Assistance Bed Mobility: Rolling;Sidelying to Sit Rolling: Min guard Sidelying to sit: Min assist   Sit to supine: Max assist;+2 for physical assistance   General bed mobility comments: Cues for hand placement and technique to maintain log rolling.    Transfers Overall transfer level: Needs assistance Equipment used: Rolling walker (2 wheeled) Transfers: Sit to/from Stand Sit to Stand: Min guard         General transfer comment: Cues for hand placement to push from seated surface to rise into standing.  Ambulation/Gait Ambulation/Gait  assistance: Min assist Gait Distance (Feet): 120 Feet Assistive device: Rolling walker (2 wheeled) (youth height) Gait Pattern/deviations: Step-through pattern;Trunk flexed;Staggering right;Narrow base of support;Decreased stride length     General Gait Details: Pt with R lateral lean to the R side ( although less aparent this session) .  Pt continues to require assistance to redirect pathway of RW and maintain position with in RW.   Stairs             Wheelchair Mobility    Modified Rankin (Stroke Patients Only)       Balance Overall balance assessment: Needs assistance Sitting-balance support: No upper extremity supported Sitting balance-[name]: Fair   Postural control: Right lateral lean Standing balance support: Bilateral upper extremity supported Standing balance-[name]: Poor Standing balance comment: heavy reliance on UE support to maintain standing.                            Cognition Arousal/Alertness: Awake/alert Behavior During Therapy: Anxious Overall Cognitive Status: History of cognitive impairments - at baseline Area of Impairment: Problem solving;Following commands;Orientation;Memory;Safety/judgement                 Orientation Level: Disoriented to;Place;Time;Situation Current Attention Level: Focused Memory: Decreased recall of precautions;Decreased short-term memory Following Commands: Follows one step commands inconsistently;Follows one step commands with increased time Safety/Judgement: Decreased awareness of safety;Decreased awareness of deficits Awareness: Intellectual   General Comments: pt with history of dementia, remains oriented only to person today. Pt required multimodal cues throughout session.  Exercises General Exercises - Lower Extremity Ankle Circles/Pumps: AROM;Both;10 reps;Supine Quad Sets: AROM;Both;10 reps;Supine Heel Slides: AROM;Both;10 reps;Supine Hip ABduction/ADduction: AROM;Both;10  reps;Supine    General Comments General comments (skin integrity, edema, etc.): pt with reddness under each breast, rn notified      Pertinent Vitals/Pain Pain Assessment: Faces Faces Pain Scale: Hurts little more Pain Location: R side of thoracic spine. Pain Descriptors / Indicators: Discomfort;Grimacing;Moaning Pain Intervention(s): Monitored during session;Repositioned    Home Living                      Prior Function            PT Goals (current goals can now be found in the care plan section) Acute Rehab PT Goals Patient Stated Goal: to go home per family Potential to Achieve Goals: Fair Progress towards PT goals: Progressing toward goals    Frequency    Min 5X/week      PT Plan Current plan remains appropriate    Co-evaluation              AM-PAC PT "6 Clicks" Mobility   Outcome Measure  Help needed turning from your back to your side while in a flat bed without using bedrails?: A Little Help needed moving from lying on your back to sitting on the side of a flat bed without using bedrails?: A Little Help needed moving to and from a bed to a chair (including a wheelchair)?: A Little Help needed standing up from a chair using your arms (e.g., wheelchair or bedside chair)?: A Little Help needed to walk in hospital room?: A Little Help needed climbing 3-5 steps with a railing? : A Little 6 Click Score: 18    End of Session Equipment Utilized During Treatment: Gait belt Activity Tolerance: Patient tolerated treatment well Patient left: with call bell/phone within reach;in chair;with chair alarm set (sitter alarm belt donned.) Nurse Communication: Mobility status PT Visit Diagnosis: Other abnormalities of gait and mobility (R26.89);Muscle weakness (generalized) (M62.81);Difficulty in walking, not elsewhere classified (R26.2);Pain Pain - part of body:  (back)     Time: 5956-3875 PT Time Calculation (min) (ACUTE ONLY): 17 min  Charges:  $Gait  Training: 8-22 mins                     Erasmo Leventhal , PTA Acute Rehabilitation Services Pager [phone] Office [phone]     Alyssa [name] Eli Hose 08/28/2020, 11:27 AM

## 2020-08-28 NOTE — Plan of Care (Signed)
  Problem: Education: Goal: Knowledge of General Education information will improve Description: Including pain rating scale, medication(s)/side effects and non-pharmacologic comfort measures Outcome: Progressing   Problem: Health Behavior/Discharge Planning: Goal: Ability to manage health-related needs will improve Outcome: Progressing   Problem: Safety: Goal: Non-violent Restraint(s) Outcome: Progressing

## 2020-08-28 NOTE — Progress Notes (Signed)
Patient discharged home with home health. Discharge instructions given to [facility], daughter. All questions answered.

## 2020-08-28 NOTE — TOC Progression Note (Addendum)
Transition of Care [facility]) - Progression Note    Patient Details  Name: Alyssa [name] MRN: [id number] Date of Birth: [birth year]  Transition of [facility]) CM/SW Contact  Sharin Mons, RN Phone Number: 08/28/2020, 1:43 PM  Clinical Narrative:    NCM spoke with pt's daughter [name] regarding disposition plan and DME needs. [name] states it would be best for pt to return to her familiar environment vs SNF/ rehab. Given pt's hx of dementia. [name] states pt will d/c to home with  family providing 24/7 supervision and assistance. Agreeable to home health services. States has no preference ,who ever accepts insurance. Referral made with [facility] health ...acceptance pending. Pt without DME needs, already has rolling walker, w/c, [name] states.  Pt's daughter to provide transportation to home when d/c.[name] No problems affording Rx meds.   KGM:WNUUVOZD, CAROLINE   TOC team following and will continue to monitor   08/28/2020 @ [place] declined acceptance for [facility] services. Referral made with [facility] and accepted.  Expected Discharge Plan: [place] Barriers to Discharge: Continued Medical Work up  Expected Discharge Plan and Services Expected Discharge Plan: [place]   Discharge Planning Services: CM Consult   Living arrangements for the past 2 months: Single Family Home Expected Discharge Date: 08/28/20                         HH Arranged: PT,OT [place] Agency: Other - See comment ([employer]) Date [place]: 08/28/20 Time [place]: 1340 Representative spoke with at [place], acceptance pending   Social Determinants of Health ([place]) Interventions    Readmission Risk Interventions Readmission Risk Prevention Plan 03/08/2019  Post Dischage Appt Not Complete  Appt Comments await discharge timing  Medication Screening Complete  Transportation Screening Complete  Some recent  data might be hidden

## 2020-08-28 NOTE — Plan of Care (Signed)
  Problem: Education: Goal: Knowledge of General Education information will improve Description: Including pain rating scale, medication(s)/side effects and non-pharmacologic comfort measures Outcome: Progressing   Problem: Health Behavior/Discharge Planning: Goal: Ability to manage health-related needs will improve Outcome: Progressing   Problem: Safety: Goal: Non-violent Restraint(s) Outcome: Progressing   

## 2020-08-29 DIAGNOSIS — Z86010 Personal history of colonic polyps: Secondary | ICD-10-CM | POA: Diagnosis not present

## 2020-08-29 DIAGNOSIS — I700 Atherosclerosis of aorta: Secondary | ICD-10-CM | POA: Diagnosis not present

## 2020-08-29 DIAGNOSIS — Z7902 Long term (current) use of antithrombotics/antiplatelets: Secondary | ICD-10-CM | POA: Diagnosis not present

## 2020-08-29 DIAGNOSIS — I110 Hypertensive heart disease with heart failure: Secondary | ICD-10-CM | POA: Diagnosis not present

## 2020-08-29 DIAGNOSIS — R310 Gross hematuria: Secondary | ICD-10-CM | POA: Diagnosis not present

## 2020-08-29 DIAGNOSIS — F0390 Unspecified dementia without behavioral disturbance: Secondary | ICD-10-CM | POA: Diagnosis not present

## 2020-08-29 DIAGNOSIS — E1151 Type 2 diabetes mellitus with diabetic peripheral angiopathy without gangrene: Secondary | ICD-10-CM | POA: Diagnosis not present

## 2020-08-29 DIAGNOSIS — E2740 Unspecified adrenocortical insufficiency: Secondary | ICD-10-CM | POA: Diagnosis not present

## 2020-08-29 DIAGNOSIS — Z9181 History of falling: Secondary | ICD-10-CM | POA: Diagnosis not present

## 2020-08-29 DIAGNOSIS — E039 Hypothyroidism, unspecified: Secondary | ICD-10-CM | POA: Diagnosis not present

## 2020-08-29 DIAGNOSIS — I5032 Chronic diastolic (congestive) heart failure: Secondary | ICD-10-CM | POA: Diagnosis not present

## 2020-08-29 DIAGNOSIS — Z794 Long term (current) use of insulin: Secondary | ICD-10-CM | POA: Diagnosis not present

## 2020-08-29 DIAGNOSIS — Z87891 Personal history of nicotine dependence: Secondary | ICD-10-CM | POA: Diagnosis not present

## 2020-08-29 DIAGNOSIS — I2510 Atherosclerotic heart disease of native coronary artery without angina pectoris: Secondary | ICD-10-CM | POA: Diagnosis not present

## 2020-08-29 DIAGNOSIS — D649 Anemia, unspecified: Secondary | ICD-10-CM | POA: Diagnosis not present

## 2020-08-29 DIAGNOSIS — M47817 Spondylosis without myelopathy or radiculopathy, lumbosacral region: Secondary | ICD-10-CM | POA: Diagnosis not present

## 2020-08-29 DIAGNOSIS — I252 Old myocardial infarction: Secondary | ICD-10-CM | POA: Diagnosis not present

## 2020-08-29 DIAGNOSIS — Z7984 Long term (current) use of oral hypoglycemic drugs: Secondary | ICD-10-CM | POA: Diagnosis not present

## 2020-08-29 DIAGNOSIS — K8020 Calculus of gallbladder without cholecystitis without obstruction: Secondary | ICD-10-CM | POA: Diagnosis not present

## 2020-08-29 DIAGNOSIS — K5790 Diverticulosis of intestine, part unspecified, without perforation or abscess without bleeding: Secondary | ICD-10-CM | POA: Diagnosis not present

## 2020-08-29 DIAGNOSIS — M8008XD Age-related osteoporosis with current pathological fracture, vertebra(e), subsequent encounter for fracture with routine healing: Secondary | ICD-10-CM | POA: Diagnosis not present

## 2020-08-29 DIAGNOSIS — S32019A Unspecified fracture of first lumbar vertebra, initial encounter for closed fracture: Secondary | ICD-10-CM | POA: Diagnosis not present

## 2020-08-29 DIAGNOSIS — K219 Gastro-esophageal reflux disease without esophagitis: Secondary | ICD-10-CM | POA: Diagnosis not present

## 2020-08-29 DIAGNOSIS — E785 Hyperlipidemia, unspecified: Secondary | ICD-10-CM | POA: Diagnosis not present

## 2020-08-30 ENCOUNTER — Inpatient Hospital Stay (HOSPITAL_COMMUNITY)
Admission: EM | Admit: 2020-08-30 | Discharge: 2020-09-07 | DRG: 536 | Disposition: A | Discharge status: Hospice | Payer: PPO | Attending: Internal Medicine | Admitting: Internal Medicine

## 2020-08-30 ENCOUNTER — Encounter (HOSPITAL_COMMUNITY): Payer: Self-pay

## 2020-08-30 ENCOUNTER — Emergency Department (HOSPITAL_COMMUNITY): Payer: PPO

## 2020-08-30 DIAGNOSIS — Z515 Encounter for palliative care: Secondary | ICD-10-CM

## 2020-08-30 DIAGNOSIS — F05 Delirium due to known physiological condition: Secondary | ICD-10-CM | POA: Diagnosis present

## 2020-08-30 DIAGNOSIS — Z8261 Family history of arthritis: Secondary | ICD-10-CM

## 2020-08-30 DIAGNOSIS — S72111A Displaced fracture of greater trochanter of right femur, initial encounter for closed fracture: Secondary | ICD-10-CM

## 2020-08-30 DIAGNOSIS — D649 Anemia, unspecified: Secondary | ICD-10-CM | POA: Diagnosis not present

## 2020-08-30 DIAGNOSIS — E7800 Pure hypercholesterolemia, unspecified: Secondary | ICD-10-CM | POA: Diagnosis not present

## 2020-08-30 DIAGNOSIS — I110 Hypertensive heart disease with heart failure: Secondary | ICD-10-CM | POA: Diagnosis not present

## 2020-08-30 DIAGNOSIS — Z8249 Family history of ischemic heart disease and other diseases of the circulatory system: Secondary | ICD-10-CM

## 2020-08-30 DIAGNOSIS — K219 Gastro-esophageal reflux disease without esophagitis: Secondary | ICD-10-CM | POA: Diagnosis not present

## 2020-08-30 DIAGNOSIS — Z7902 Long term (current) use of antithrombotics/antiplatelets: Secondary | ICD-10-CM

## 2020-08-30 DIAGNOSIS — I252 Old myocardial infarction: Secondary | ICD-10-CM

## 2020-08-30 DIAGNOSIS — E1159 Type 2 diabetes mellitus with other circulatory complications: Secondary | ICD-10-CM

## 2020-08-30 DIAGNOSIS — I10 Essential (primary) hypertension: Secondary | ICD-10-CM | POA: Diagnosis present

## 2020-08-30 DIAGNOSIS — S72115A Nondisplaced fracture of greater trochanter of left femur, initial encounter for closed fracture: Secondary | ICD-10-CM | POA: Diagnosis not present

## 2020-08-30 DIAGNOSIS — Z20822 Contact with and (suspected) exposure to covid-19: Secondary | ICD-10-CM | POA: Diagnosis not present

## 2020-08-30 DIAGNOSIS — M545 Low back pain, unspecified: Secondary | ICD-10-CM | POA: Diagnosis not present

## 2020-08-30 DIAGNOSIS — W010XXA Fall on same level from slipping, tripping and stumbling without subsequent striking against object, initial encounter: Secondary | ICD-10-CM | POA: Diagnosis present

## 2020-08-30 DIAGNOSIS — Z09 Encounter for follow-up examination after completed treatment for conditions other than malignant neoplasm: Secondary | ICD-10-CM | POA: Diagnosis not present

## 2020-08-30 DIAGNOSIS — I5032 Chronic diastolic (congestive) heart failure: Secondary | ICD-10-CM | POA: Diagnosis present

## 2020-08-30 DIAGNOSIS — I2510 Atherosclerotic heart disease of native coronary artery without angina pectoris: Secondary | ICD-10-CM | POA: Diagnosis not present

## 2020-08-30 DIAGNOSIS — R451 Restlessness and agitation: Secondary | ICD-10-CM | POA: Diagnosis present

## 2020-08-30 DIAGNOSIS — R413 Other amnesia: Secondary | ICD-10-CM | POA: Diagnosis present

## 2020-08-30 DIAGNOSIS — Z79899 Other long term (current) drug therapy: Secondary | ICD-10-CM | POA: Diagnosis not present

## 2020-08-30 DIAGNOSIS — S72142A Displaced intertrochanteric fracture of left femur, initial encounter for closed fracture: Principal | ICD-10-CM | POA: Diagnosis present

## 2020-08-30 DIAGNOSIS — R627 Adult failure to thrive: Secondary | ICD-10-CM | POA: Diagnosis not present

## 2020-08-30 DIAGNOSIS — W19XXXA Unspecified fall, initial encounter: Secondary | ICD-10-CM

## 2020-08-30 DIAGNOSIS — Z7984 Long term (current) use of oral hypoglycemic drugs: Secondary | ICD-10-CM | POA: Diagnosis not present

## 2020-08-30 DIAGNOSIS — E876 Hypokalemia: Secondary | ICD-10-CM | POA: Diagnosis not present

## 2020-08-30 DIAGNOSIS — E785 Hyperlipidemia, unspecified: Secondary | ICD-10-CM | POA: Diagnosis not present

## 2020-08-30 DIAGNOSIS — Z66 Do not resuscitate: Secondary | ICD-10-CM | POA: Diagnosis not present

## 2020-08-30 DIAGNOSIS — R531 Weakness: Secondary | ICD-10-CM | POA: Diagnosis not present

## 2020-08-30 DIAGNOSIS — R339 Retention of urine, unspecified: Secondary | ICD-10-CM | POA: Diagnosis not present

## 2020-08-30 DIAGNOSIS — Z9181 History of falling: Secondary | ICD-10-CM | POA: Diagnosis not present

## 2020-08-30 DIAGNOSIS — M25551 Pain in right hip: Secondary | ICD-10-CM | POA: Diagnosis not present

## 2020-08-30 DIAGNOSIS — Y92009 Unspecified place in unspecified non-institutional (private) residence as the place of occurrence of the external cause: Secondary | ICD-10-CM

## 2020-08-30 DIAGNOSIS — Z794 Long term (current) use of insulin: Secondary | ICD-10-CM | POA: Diagnosis not present

## 2020-08-30 DIAGNOSIS — S79929A Unspecified injury of unspecified thigh, initial encounter: Secondary | ICD-10-CM | POA: Diagnosis not present

## 2020-08-30 DIAGNOSIS — F0391 Unspecified dementia with behavioral disturbance: Secondary | ICD-10-CM | POA: Diagnosis not present

## 2020-08-30 DIAGNOSIS — R410 Disorientation, unspecified: Secondary | ICD-10-CM | POA: Diagnosis not present

## 2020-08-30 DIAGNOSIS — F419 Anxiety disorder, unspecified: Secondary | ICD-10-CM | POA: Diagnosis not present

## 2020-08-30 DIAGNOSIS — Z043 Encounter for examination and observation following other accident: Secondary | ICD-10-CM | POA: Diagnosis not present

## 2020-08-30 DIAGNOSIS — Z87891 Personal history of nicotine dependence: Secondary | ICD-10-CM

## 2020-08-30 DIAGNOSIS — E039 Hypothyroidism, unspecified: Secondary | ICD-10-CM | POA: Diagnosis not present

## 2020-08-30 DIAGNOSIS — E2740 Unspecified adrenocortical insufficiency: Secondary | ICD-10-CM | POA: Diagnosis not present

## 2020-08-30 DIAGNOSIS — Z955 Presence of coronary angioplasty implant and graft: Secondary | ICD-10-CM

## 2020-08-30 DIAGNOSIS — M25552 Pain in left hip: Secondary | ICD-10-CM | POA: Diagnosis not present

## 2020-08-30 DIAGNOSIS — E119 Type 2 diabetes mellitus without complications: Secondary | ICD-10-CM

## 2020-08-30 DIAGNOSIS — E1165 Type 2 diabetes mellitus with hyperglycemia: Secondary | ICD-10-CM | POA: Diagnosis not present

## 2020-08-30 DIAGNOSIS — M1612 Unilateral primary osteoarthritis, left hip: Secondary | ICD-10-CM | POA: Diagnosis not present

## 2020-08-30 DIAGNOSIS — S72112A Displaced fracture of greater trochanter of left femur, initial encounter for closed fracture: Secondary | ICD-10-CM | POA: Diagnosis not present

## 2020-08-30 DIAGNOSIS — S7292XA Unspecified fracture of left femur, initial encounter for closed fracture: Secondary | ICD-10-CM | POA: Diagnosis present

## 2020-08-30 DIAGNOSIS — R52 Pain, unspecified: Secondary | ICD-10-CM | POA: Diagnosis not present

## 2020-08-30 LAB — CBC
HCT: 43.8 % (ref 36.0–46.0)
Hemoglobin: 14.1 g/dL (ref 12.0–15.0)
MCH: 28.5 pg (ref 26.0–34.0)
MCHC: 32.2 g/dL (ref 30.0–36.0)
MCV: 88.7 fL (ref 80.0–100.0)
Platelets: 377 10*3/uL (ref 150–400)
RBC: 4.94 MIL/uL (ref 3.87–5.11)
RDW: 14.9 % (ref 11.5–15.5)
WBC: 12.5 10*3/uL — ABNORMAL HIGH (ref 4.0–10.5)
nRBC: 0 % (ref 0.0–0.2)

## 2020-08-30 LAB — BASIC METABOLIC PANEL
Anion gap: 9 (ref 5–15)
BUN: 11 mg/dL (ref 8–23)
CO2: 24 mmol/L (ref 22–32)
Calcium: 8.9 mg/dL (ref 8.9–10.3)
Chloride: 105 mmol/L (ref 98–111)
Creatinine, Ser: 0.88 mg/dL (ref 0.44–1.00)
GFR, Estimated: >60 mL/min (ref >60)
Glucose, Bld: 194 mg/dL — ABNORMAL HIGH (ref 70–99)
Potassium: 3.4 mmol/L — ABNORMAL LOW (ref 3.5–5.1)
Sodium: 138 mmol/L (ref 135–145)

## 2020-08-30 MED ORDER — ESCITALOPRAM OXALATE 10 MG PO TABS
20.0000 mg | ORAL_TABLET | Freq: Every evening | ORAL | Status: DC
  Administered 2020-08-31 – 2020-09-06 (×7): 20 mg via ORAL
  Filled 2020-08-30 (×7): qty 2

## 2020-08-30 MED ORDER — DIPHENHYDRAMINE HCL 50 MG/ML IJ SOLN
25.0000 mg | Freq: Once | INTRAMUSCULAR | Status: AC
  Administered 2020-08-30: 25 mg via INTRAMUSCULAR
  Filled 2020-08-30: qty 1

## 2020-08-30 MED ORDER — GLIMEPIRIDE 4 MG PO TABS: 4.0000 mg | ORAL_TABLET | Freq: Every evening | ORAL | Status: DC

## 2020-08-30 MED ORDER — HYDROCORTISONE 5 MG PO TABS
15.0000 mg | ORAL_TABLET | Freq: Every evening | ORAL | Status: DC
  Administered 2020-08-31 – 2020-09-06 (×7): 15 mg via ORAL
  Filled 2020-08-30 (×8): qty 1

## 2020-08-30 MED ORDER — EZETIMIBE 10 MG PO TABS
10.0000 mg | ORAL_TABLET | Freq: Every evening | ORAL | Status: DC
  Administered 2020-08-31 – 2020-09-06 (×7): 10 mg via ORAL
  Filled 2020-08-30 (×7): qty 1

## 2020-08-30 MED ORDER — ENOXAPARIN SODIUM 40 MG/0.4ML IJ SOSY
40.0000 mg | PREFILLED_SYRINGE | INTRAMUSCULAR | Status: DC
  Administered 2020-08-31 – 2020-09-07 (×8): 40 mg via SUBCUTANEOUS
  Filled 2020-08-30 (×8): qty 0.4

## 2020-08-30 MED ORDER — DONEPEZIL HCL 10 MG PO TABS
10.0000 mg | ORAL_TABLET | Freq: Every evening | ORAL | Status: DC
  Administered 2020-08-31 – 2020-09-06 (×7): 10 mg via ORAL
  Filled 2020-08-30 (×8): qty 1

## 2020-08-30 MED ORDER — INSULIN DETEMIR 100 UNIT/ML ~~LOC~~ SOLN
20.0000 [IU] | Freq: Every evening | SUBCUTANEOUS | Status: DC
  Filled 2020-08-30 (×2): qty 0.2

## 2020-08-30 MED ORDER — DIVALPROEX SODIUM ER 250 MG PO TB24
250.0000 mg | ORAL_TABLET | Freq: Every evening | ORAL | Status: DC
  Administered 2020-08-31 – 2020-09-06 (×7): 250 mg via ORAL
  Filled 2020-08-30 (×9): qty 1

## 2020-08-30 MED ORDER — LEVOTHYROXINE SODIUM 25 MCG PO TABS
125.0000 ug | ORAL_TABLET | Freq: Every day | ORAL | Status: DC
  Administered 2020-08-31 – 2020-09-07 (×8): 125 ug via ORAL
  Filled 2020-08-30 (×8): qty 1

## 2020-08-30 MED ORDER — LORAZEPAM 2 MG/ML IJ SOLN
1.0000 mg | Freq: Once | INTRAMUSCULAR | Status: DC
  Filled 2020-08-30 (×2): qty 1

## 2020-08-30 MED ORDER — ACETAMINOPHEN 650 MG RE SUPP: 650.0000 mg | Freq: Four times a day (QID) | RECTAL | Status: DC | PRN

## 2020-08-30 MED ORDER — ACETAMINOPHEN 325 MG PO TABS
650.0000 mg | ORAL_TABLET | Freq: Four times a day (QID) | ORAL | Status: DC | PRN
  Administered 2020-09-01 – 2020-09-07 (×8): 650 mg via ORAL
  Filled 2020-08-30 (×8): qty 2

## 2020-08-30 MED ORDER — METFORMIN HCL ER 500 MG PO TB24: 1000.0000 mg | ORAL_TABLET | Freq: Every evening | ORAL | Status: DC

## 2020-08-30 MED ORDER — CARVEDILOL 3.125 MG PO TABS
3.1250 mg | ORAL_TABLET | Freq: Two times a day (BID) | ORAL | Status: DC
  Administered 2020-08-31 – 2020-09-07 (×14): 3.125 mg via ORAL
  Filled 2020-08-30 (×15): qty 1

## 2020-08-30 MED ORDER — CLOPIDOGREL BISULFATE 75 MG PO TABS
75.0000 mg | ORAL_TABLET | Freq: Every day | ORAL | Status: DC
  Administered 2020-08-31 – 2020-09-07 (×8): 75 mg via ORAL
  Filled 2020-08-30 (×8): qty 1

## 2020-08-30 MED ORDER — FUROSEMIDE 20 MG PO TABS
20.0000 mg | ORAL_TABLET | Freq: Every evening | ORAL | Status: DC
  Administered 2020-08-31 – 2020-09-02 (×3): 20 mg via ORAL
  Filled 2020-08-30 (×4): qty 1

## 2020-08-30 MED ORDER — LOSARTAN POTASSIUM 50 MG PO TABS
50.0000 mg | ORAL_TABLET | Freq: Every evening | ORAL | Status: DC
  Administered 2020-08-31 – 2020-09-04 (×5): 50 mg via ORAL
  Filled 2020-08-30 (×5): qty 1

## 2020-08-30 MED ORDER — OXYCODONE HCL 5 MG PO TABS
5.0000 mg | ORAL_TABLET | ORAL | Status: DC | PRN
  Administered 2020-08-31 – 2020-09-05 (×7): 5 mg via ORAL
  Filled 2020-08-30 (×8): qty 1

## 2020-08-30 MED ORDER — HALOPERIDOL LACTATE 5 MG/ML IJ SOLN
2.0000 mg | Freq: Once | INTRAMUSCULAR | Status: AC
  Administered 2020-08-30: 2 mg via INTRAMUSCULAR
  Filled 2020-08-30: qty 1

## 2020-08-30 MED ORDER — ATORVASTATIN CALCIUM 80 MG PO TABS
80.0000 mg | ORAL_TABLET | Freq: Every day | ORAL | Status: DC
  Administered 2020-08-31 – 2020-09-07 (×8): 80 mg via ORAL
  Filled 2020-08-30 (×8): qty 1

## 2020-08-30 NOTE — ED Triage Notes (Signed)
Fall at home with c/o left hip pain. Hx of dementia. Was seen 1 week ago after fall. Was sent home with walker after family refused facility placement.

## 2020-08-30 NOTE — ED Notes (Signed)
Attempted to get pt's vital signs, however pt is extremely agitated. Pt screamed at this writer, "You can shut your mouth and get out of my house! Get!"

## 2020-08-30 NOTE — ED Notes (Signed)
ED Provider at bedside. 

## 2020-08-30 NOTE — H&P (Signed)
History and Physical        Hospital Admission Note Date: 08/30/2020  Patient name: Alyssa [name] Medical record number: [id number] Date of birth: 02-12-1936 Age: 85 y.o. Gender: female  PCP: Ernestene Kiel, MD    Patient coming from: Home   I have reviewed all records in the [facility].    Chief Complaint:  Fall   HPI: Alyssa [name] is 85 y.o. female with PMH of T2DM, CAD, dementia, HTN, hypothyroidism who presents after fall at home. Given dementia, unable to obtain history from patient.   History copied from ED provider documentation:  Alyssa [name] is a 85 y.o. female past medical history of advanced dementia.  She presents after fall.  History is gathered by the patient's daughter by telephone.  She reports that today the patient was trying to ambulate with her walker, slipped and fell onto her bottom.  She is complaining of pain in her lower back and left hip.  Patient reports that she was admitted back at the end of April after fall and that the doctors had recommended skilled nursing facility placement however her family decided to take the patient home because there are 5 siblings who could help however she states that has been extremely challenging for them.  She notes that the patient's dementia and abstinence has become much worse and that her sundowning is also worse after her fall.  She says that her mother is "extremely stubborn" and will not let them help her as necessary.  This is what led to her fall today.  She did not hit her head or lose consciousness.   ED work-up/course:   Review of Systems: Positives marked in 'bold' Unable to obtain due to dementia.   Past Medical History: Past Medical History:  Diagnosis Date  . Anemia   . CAD (coronary artery disease)    a. 2013 NSTEMI s/p DES to LAD. b. 04/03/14: NSTEMI s/p DES to OM1, DES to dRCA.  [name] Chronic diastolic CHF (congestive heart failure)  ([place])    a. 2D ECHO 04/02/14 with hypokinesis in the basal inferior and inferoseptal walls. Focal and moderate concentric LVH. Overall LVEF 60-65%. G1DD. Elevated EDLV filling pressures. Mild TR.  . Diverticulosis   . GERD (gastroesophageal reflux disease)   . History of GI bleed    a. 2013: adm for ABL anemia. 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  [name] HLD (hyperlipidemia)   . HTN (hypertension)   . Hypothyroid   . Memory disorder 10/06/2017  . Peripheral vascular disease ([place])   . Refusal of blood transfusions as patient is Jehovah's Witness 09-29-12  . Serrated polyp of colon 2013  . Type II diabetes mellitus ([place])     Past Surgical History:  Procedure Laterality Date  . CESAREAN SECTION  ~ 1961; 1966   plus 3 NVD  . COLONOSCOPY  02/27/2012   Procedure: COLONOSCOPY;  Surgeon: Lear Ng, MD;  Location: [facility] ENDOSCOPY;  Service: Endoscopy;  Laterality: N/A;  . CORONARY ANGIOPLASTY WITH STENT PLACEMENT  05/2011; 04/03/2014  . ESOPHAGOGASTRODUODENOSCOPY  02/26/2012  Procedure: ESOPHAGOGASTRODUODENOSCOPY (EGD);  Surgeon: Lear Ng, MD;  Location: [facility] ENDOSCOPY;  Service: Endoscopy;  Laterality: N/A;  . LEFT HEART CATHETERIZATION WITH CORONARY ANGIOGRAM N/A 06/12/2011   Procedure: LEFT HEART CATHETERIZATION WITH CORONARY ANGIOGRAM;  Surgeon: Josue Hector, MD;  Location: [facility] CATH LAB;  Service: Cardiovascular;  Laterality: N/A;  . LEFT HEART CATHETERIZATION WITH CORONARY ANGIOGRAM N/A 04/03/2014   Procedure: LEFT HEART CATHETERIZATION WITH CORONARY ANGIOGRAM;  Surgeon: Jettie Booze, MD;  Location: [facility] CATH LAB;  Service: Cardiovascular;  Laterality: N/A;  . TONSILLECTOMY    . VAGINAL HYSTERECTOMY      Medications: Prior to Admission medications   Medication Sig Start Date End Date Taking? Authorizing Provider  atorvastatin (LIPITOR) 80 MG tablet TAKE 1 TABLET BY  MOUTH ONCE DAILY. Patient taking differently: Take 80 mg by mouth every evening. 12/22/17   Sueanne Margarita, MD  carvedilol (COREG) 3.125 MG tablet TAKE 1 TABLET BY MOUTH TWICE DAILY Patient taking differently: Take 3.125 mg by mouth every evening. 11/17/17   Sueanne Margarita, MD  clopidogrel (PLAVIX) 75 MG tablet TAKE 1 TABLET BY MOUTH ONCE DAILY. Patient taking differently: Take 75 mg by mouth every evening. 11/17/17   Sueanne Margarita, MD  divalproex (DEPAKOTE ER) 250 MG 24 hr tablet Take 250 mg by mouth every evening.    [provider]  donepezil (ARICEPT) 10 MG tablet Take 10 mg by mouth every evening. 08/17/19   [provider]  escitalopram (LEXAPRO) 20 MG tablet Take 1 tablet (20 mg total) by mouth every evening. 03/09/19   Al Decant, MD  fluticasone [facility]) 50 MCG/ACT nasal spray Place 2 sprays into both nostrils daily as needed for allergies. 08/17/19   [provider]  furosemide (LASIX) 20 MG tablet TAKE 1 TABLET BY MOUTH ONCE DAILY. Patient taking differently: Take by mouth every evening. 12/22/17   Sueanne Margarita, MD  glimepiride (AMARYL) 4 MG tablet Take 4 mg by mouth every evening.    [provider]  hydrocortisone (CORTEF) 5 MG tablet Take 15 mg by mouth every evening. 08/17/19   [provider]  LEVEMIR FLEXTOUCH 100 UNIT/ML FlexPen Inject 20 Units into the skin every evening. 08/17/19   [provider]  levothyroxine (SYNTHROID) 125 MCG tablet Take 1 tablet by mouth daily before breakfast. 08/17/19   [provider]  losartan (COZAAR) 50 MG tablet Take 50 mg by mouth every evening.  11/08/18   [provider]  meclizine (ANTIVERT) 25 MG tablet Take 25 mg by mouth 4 (four) times daily as needed for dizziness. 06/07/20   [provider]  metFORMIN (GLUCOPHAGE-XR) 500 MG 24 hr tablet Take 1,000 mg by mouth every evening. 02/28/19   [provider]  methocarbamol (ROBAXIN) 500 MG tablet Take 1 tablet  (500 mg total) by mouth 2 (two) times daily as needed for muscle spasms. 08/08/20   Noemi Chapel, MD  nitroGLYCERIN (NITROSTAT) 0.4 MG SL tablet Place 0.4 mg under the tongue every 5 (five) minutes as needed for chest pain. Reported on 10/04/2015    [provider]  oxyCODONE (OXY IR/ROXICODONE) 5 MG immediate release tablet Take 1 tablet (5 mg total) by mouth every 6 (six) hours as needed for moderate pain. 08/28/20   Mariel Aloe, MD  polyethylene glycol (MIRALAX / GLYCOLAX) packet Take 17 g by mouth daily as needed for mild constipation. Reported on 10/04/2015    [provider]  [place] X 5/16" 0.5  ML MISC  12/31/16   [provider]  ZETIA 10 MG tablet TAKE 1 TABLET BY MOUTH EVERY DAY Patient taking differently: Take 10 mg by mouth every evening. 03/05/15   Sueanne Margarita, MD    Allergies:   Allergies  Allergen Reactions  . Tetanus Toxoid, Adsorbed Swelling and Rash    Site of injection swells    Social History:  reports that she quit smoking about 50 years ago. Her smoking use included cigarettes. She has a 24.00 pack-year smoking history. She has never used smokeless tobacco. She reports current alcohol use of about 3.0 - 4.0 standard drinks of alcohol per week. She reports that she does not use drugs.  Family History: Family History  Problem Relation Age of Onset  . Arthritis Father   . Heart disease Mother   . Varicose Veins Mother   . Hypertension Other     Physical Exam: Blood pressure (!) 129/108, pulse 85, temperature 97.9 F (36.6 C), temperature source Oral, resp. rate (!) 22, SpO2 96 %. General: Awake. Angry about people being on her personal property.  Eyes: pink conjunctiva,anicteric sclera, pupils equal and reactive to light and accomodation, HEENT: normocephalic, atraumatic, oropharynx clear Neck: supple, no masses or lymphadenopathy, no goiter, no bruits, no JVD CVS: Regular rate and rhythm, without murmurs, rubs  or gallops. No lower extremity edema Resp : Clear to auscultation bilaterally, no wheezing, rales or rhonchi. GI : Soft, nontender, nondistended, positive bowel sounds, no masses. No hepatomegaly. No hernia.  Musculoskeletal: TTP over the left hip. Pain with rotation.  Neuro: Grossly intact, no focal neurological deficits, strength 5/5 upper and lower extremities bilaterally Psych: alert and oriented to self only Skin: no rashes or lesions, warm and dry   LABS on Admission: I have personally reviewed all the labs and imagings below    Basic Metabolic Panel: Recent Labs  Lab 08/30/20 2013  NA 138  K 3.4*  CL 105  CO2 24  GLUCOSE 194*  BUN 11  CREATININE 0.88  CALCIUM 8.9   Liver Function Tests: No results for input(s): AST, ALT, ALKPHOS, BILITOT, PROT, ALBUMIN in the last 168 hours. No results for input(s): LIPASE, AMYLASE in the last 168 hours. No results for input(s): AMMONIA in the last 168 hours. CBC: Recent Labs  Lab 08/30/20 2013  WBC 12.5*  HGB 14.1  HCT 43.8  MCV 88.7  PLT 377   Cardiac Enzymes: No results for input(s): CKTOTAL, CKMB, CKMBINDEX, TROPONINI in the last 168 hours. BNP: Invalid input(s): POCBNP CBG: Recent Labs  Lab 08/28/20 0632 08/28/20 1151  GLUCAP 83 123*    Radiological Exams on Admission:  DG Lumbar Spine Complete  Result Date: 08/30/2020 CLINICAL DATA:  Recent fall with low back pain, initial encounter EXAM: LUMBAR SPINE - COMPLETE 4+ VIEW COMPARISON:  08/22/2020, 08/08/2020 FINDINGS: L1 compression deformity is noted progressed when compared with the prior CT. No new compression deformity is seen. No pars defects or anterolisthesis is seen. Mild increased kyphosis at the L1 level is noted secondary to the fracture. IMPRESSION: Mild progress in L1 compression deformity when compared with recent CT examination. Electronically Signed   By: Inez Catalina M.D.   On: 08/30/2020 19:43   CT Hip Left Wo Contrast  Result Date:  08/30/2020 CLINICAL DATA:  Left hip pain. EXAM: CT OF THE LEFT HIP WITHOUT CONTRAST TECHNIQUE: Multidetector CT imaging of the left hip was performed according to the standard protocol. Multiplanar CT image reconstructions were also generated. COMPARISON:  Radiographs of same day. FINDINGS: Mildly displaced fracture is seen involving the greater trochanter of the proximal left femur. This appears to be in a nonweightbearing area of the bone. No other fracture or dislocation is noted. No significant degenerative changes seen involving the left hip joint. No significant soft tissue abnormality is noted. IMPRESSION: Mildly displaced fracture is seen involving the greater trochanter of the proximal left femur. Electronically Signed   By: Marijo Conception M.D.   On: 08/30/2020 21:00   DG HIP UNILAT WITH PELVIS 2-3 VIEWS LEFT  Result Date: 08/30/2020 CLINICAL DATA:  Status post fall. EXAM: DG HIP (WITH OR WITHOUT PELVIS) 2-3V LEFT COMPARISON:  None. FINDINGS: In ill-defined cortical lucency is seen along the greater trochanter of the proximal left femur. There is no evidence of dislocation. Mild degenerative changes are seen within the form of joint space narrowing and acetabular sclerosis. IMPRESSION: Ill-defined cortical lucency along the greater trochanter which is of indeterminate age. Evaluation with physical exam is recommended to determine the presence of point tenderness within this region. CT correlation is recommended if acute fracture remains of clinical concern. Electronically Signed   By: Virgina Norfolk M.D.   On: 08/30/2020 19:44   DG HIP UNILAT WITH PELVIS 2-3 VIEWS RIGHT  Result Date: 08/30/2020 CLINICAL DATA:  Right hip pain following fall, initial encounter EXAM: DG HIP (WITH OR WITHOUT PELVIS) 3V RIGHT COMPARISON:  None. FINDINGS: Pelvic ring is intact. Proximal right femur appears within normal limits. No definitive fracture or dislocation is seen. No soft tissue abnormality is noted.  IMPRESSION: No acute abnormality noted. Electronically Signed   By: Inez Catalina M.D.   On: 08/30/2020 19:41      Assessment/Plan Active Problems:   Hypothyroid   DM type 2 (diabetes mellitus, type 2) (HCC)   Essential hypertension   Chronic diastolic CHF (congestive heart failure) (HCC)   CAD (coronary artery disease)   HLD (hyperlipidemia)   Memory disorder   Femur fracture, left (HCC)  Left Femur Fracture  Nondisplaced fracture of the greater trochanter of the proximal left femur. Ortho consulted, non-operative fracture.  -admit to inpatient MedSurg  -PT/OT consult, mobilize as tolerated  -fall precautions  -Oxycodone 5 mg q4h prn severe pain, tylenol prn  -consult care management for SNF placement assistance   HTN BP normotensive to mildly elevated, pain likely contributing.  -continue Coreg 3.25 mg BID and Losartan 50 mg   T2DM Recent A1c 9.3% -continue home Levemir 20u, metformin 1000 mg daily, and Amaryl 4 mg daily -CBGs AC/HS, add SSI as needed  -carb modified diet   Hypothyroidism TSH at recent admission was 98.9. Apparently at that time was nonadherent to Synthroid.  -continue Synthroid 125 mcg, will need f/u TSH 4-6 weeks from prior to guide dosage   CAD/HLD -continue Plavix, Lipitor, Zetia   Dementia  -continue Aricept, Depakote, Lexapro  -delirium precautions   Adrenal Insufficiency  -continue Hydrocortisone 15 mg    DVT prophylaxis: Lovenox   CODE STATUS: FULL   Consults called: Ortho    Admission status: Inpatient   The medical decision making on this patient was of high complexity and the patient is at high risk for clinical deterioration, therefore this is a level 3 admission.  Severity of Illness:      The appropriate patient status for this patient is INPATIENT. Inpatient status is judged to be reasonable and necessary in order to provide the required intensity of service to ensure the patient's safety. The patient's presenting  symptoms,  physical exam findings, and initial radiographic and laboratory data in the context of their chronic comorbidities is felt to place them at high risk for further clinical deterioration. Furthermore, it is not anticipated that the patient will be medically stable for discharge from the hospital within 2 midnights of admission. The following factors support the patient status of inpatient.   " The patient's presenting symptoms include hip pain. " The worrisome physical exam findings include TTP over the left hip. " The initial radiographic and laboratory data are worrisome because of greater trochanter fracture of left proximal femur. " The chronic co-morbidities include T2DM, CAD, dementia, HTN, hypothyroidism.   * I certify that at the point of admission it is my clinical judgment that the patient will require inpatient hospital care spanning beyond 2 midnights from the point of admission due to high intensity of service, high risk for further deterioration and high frequency of surveillance required.*    Time Spent on Admission: 36 minutes      Melina Schools D.O.  [facility] 08/30/2020, 11:21 PM

## 2020-08-30 NOTE — Progress Notes (Signed)
Patient ID: Alyssa [name], female   DOB: [birth year], 85 y.o.   MRN: [id number] I have reviewed the patient's pelvis and hip x-rays as well as a CT scan.  She has a nondisplaced fracture of the right greater trochanter of the left proximal femur.  This is a nonoperative type of fracture.  The usual recommendation for fracture such as this is weightbearing as tolerated and up with assistance using a walker.  Obviously if the patient is not tolerating full weightbearing, attempts can still be made to try to mobilize the patient as comfort allows.  Therapy can also limit hip abduction of that left hip.  Again, no surgery is indicated for this type of fracture.  Follow-up can be as an outpatient in 2 weeks for repeat x-rays.

## 2020-08-30 NOTE — ED Provider Notes (Signed)
[facility] EMERGENCY DEPARTMENT Provider Note   CSN: [id number] Arrival date & time: 08/30/20  1726     History Chief Complaint  Patient presents with  . [place] is a 85 y.o. female past medical history of advanced dementia.  She presents after fall.  History is gathered by the patient's daughter by telephone.  She reports that today the patient was trying to ambulate with her walker, slipped and fell onto her bottom.  She is complaining of pain in her lower back and left hip.  Patient reports that she was admitted back at the end of April after fall and that the doctors had recommended skilled nursing facility placement however her family decided to take the patient home because there are 5 siblings who could help however she states that has been extremely challenging for them.  She notes that the patient's dementia and abstinence has become much worse and that her sundowning is also worse after her fall.  She says that her mother is "extremely stubborn" and will not let them help her as necessary.  This is what led to her fall today.  She did not hit her head or lose consciousness. HPI     Past Medical History:  Diagnosis Date  . Anemia   . CAD (coronary artery disease)    a. 2013 NSTEMI s/p DES to LAD. b. 04/03/14: NSTEMI s/p DES to OM1, DES to dRCA.  [name] Chronic diastolic CHF (congestive heart failure) ([place])    a. 2D ECHO 04/02/14 with hypokinesis in the basal inferior and inferoseptal walls. Focal and moderate concentric LVH. Overall LVEF 60-65%. G1DD. Elevated EDLV filling pressures. Mild TR.  . Diverticulosis   . GERD (gastroesophageal reflux disease)   . History of GI bleed    a. 2013: adm for ABL anemia. 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  [name] HLD (hyperlipidemia)   . HTN (hypertension)   . Hypothyroid   . Memory disorder 10/06/2017  .  Peripheral vascular disease ([place])   . Refusal of blood transfusions as patient is Jehovah's Witness 09-29-12  . Serrated polyp of colon 2013  . Type II diabetes mellitus [facility])     Patient Active Problem List   Diagnosis Date Noted  . Lumbar compression fracture ([place]) 08/22/2020  . Adrenal insufficiency ([place]) 03/09/2019  . Hypothermia   . Acute metabolic encephalopathy due to hypoglycemia   . Hypoglycemia 03/07/2019  . Arthralgia of left temporomandibular joint 02/09/2018  . Referred otalgia of left ear 02/09/2018  . Memory disorder 10/06/2017  . Insulin dependent diabetes mellitus 11/19/2016  . Chest pain 11/18/2016  . Diabetes mellitus without complication ([place]) 74/25/9563  . Dysthymia 06/20/2014  . Hypertensive heart disease 04/11/2014  . Anemia   . History of GI bleed   . CAD (coronary artery disease)   . HLD (hyperlipidemia)   . Peripheral vascular disease ([place])   . Chronic diastolic CHF (congestive heart failure) ([place])   . Essential hypertension   . NSTEMI (non-ST elevated myocardial infarction) ([place]) 04/01/2014  . Refusal of blood transfusions as patient is Jehovah's Witness 09/29/2012  . Blood loss anemia 02/26/2012  . Melena 02/25/2012  . GERD (gastroesophageal reflux disease) 11/11/2011  . DM type 2 (diabetes mellitus, type 2) ([place]) 05/26/2011  . Hypothyroid     Past Surgical History:  Procedure Laterality Date  . CESAREAN SECTION  ~ 1961; 1966  plus 3 NVD  . COLONOSCOPY  02/27/2012   Procedure: COLONOSCOPY;  Surgeon: Lear Ng, MD;  Location: [facility] ENDOSCOPY;  Service: Endoscopy;  Laterality: N/A;  . CORONARY ANGIOPLASTY WITH STENT PLACEMENT  05/2011; 04/03/2014  . ESOPHAGOGASTRODUODENOSCOPY  02/26/2012   Procedure: ESOPHAGOGASTRODUODENOSCOPY (EGD);  Surgeon: Lear Ng, MD;  Location: [facility] ENDOSCOPY;  Service: Endoscopy;  Laterality: N/A;  . LEFT HEART CATHETERIZATION WITH CORONARY ANGIOGRAM N/A 06/12/2011   Procedure: LEFT HEART CATHETERIZATION  WITH CORONARY ANGIOGRAM;  Surgeon: Josue Hector, MD;  Location: [facility] CATH LAB;  Service: Cardiovascular;  Laterality: N/A;  . LEFT HEART CATHETERIZATION WITH CORONARY ANGIOGRAM N/A 04/03/2014   Procedure: LEFT HEART CATHETERIZATION WITH CORONARY ANGIOGRAM;  Surgeon: Jettie Booze, MD;  Location: [facility] CATH LAB;  Service: Cardiovascular;  Laterality: N/A;  . TONSILLECTOMY    . VAGINAL HYSTERECTOMY       OB History   No obstetric history on file.     Family History  Problem Relation Age of Onset  . Arthritis Father   . Heart disease Mother   . Varicose Veins Mother   . Hypertension Other     Social History   Tobacco Use  . Smoking status: Former Smoker    Packs/day: 1.50    Years: 16.00    Pack years: 24.00    Types: Cigarettes    Quit date: 03/15/1970    Years since quitting: 50.4  . Smokeless tobacco: Never Used  Vaping Use  . Vaping Use: Never used  Substance Use Topics  . Alcohol use: Yes    Alcohol/week: 3.0 - 4.0 standard drinks    Types: 3 - 4 Standard drinks or equivalent per week    Comment: 04/03/2014 "glass of wine or a beer 2-3 times/month"  . Drug use: No    Home Medications Prior to Admission medications   Medication Sig Start Date End Date Taking? Authorizing Provider  atorvastatin (LIPITOR) 80 MG tablet TAKE 1 TABLET BY MOUTH ONCE DAILY. Patient taking differently: Take 80 mg by mouth every evening. 12/22/17   Sueanne Margarita, MD  carvedilol (COREG) 3.125 MG tablet TAKE 1 TABLET BY MOUTH TWICE DAILY Patient taking differently: Take 3.125 mg by mouth every evening. 11/17/17   Sueanne Margarita, MD  clopidogrel (PLAVIX) 75 MG tablet TAKE 1 TABLET BY MOUTH ONCE DAILY. Patient taking differently: Take 75 mg by mouth every evening. 11/17/17   Sueanne Margarita, MD  divalproex (DEPAKOTE ER) 250 MG 24 hr tablet Take 250 mg by mouth every evening.    [provider]  donepezil (ARICEPT) 10 MG tablet Take 10 mg by mouth every evening. 08/17/19   [provider]  escitalopram (LEXAPRO) 20 MG tablet Take 1 tablet (20 mg total) by mouth every evening. 03/09/19   Al Decant, MD  fluticasone [facility]) 50 MCG/ACT nasal spray Place 2 sprays into both nostrils daily as needed for allergies. 08/17/19   [provider]  furosemide (LASIX) 20 MG tablet TAKE 1 TABLET BY MOUTH ONCE DAILY. Patient taking differently: Take by mouth every evening. 12/22/17   Sueanne Margarita, MD  glimepiride (AMARYL) 4 MG tablet Take 4 mg by mouth every evening.    [provider]  hydrocortisone (CORTEF) 5 MG tablet Take 15 mg by mouth every evening. 08/17/19   [provider]  LEVEMIR FLEXTOUCH 100 UNIT/ML FlexPen Inject 20 Units into the skin every evening. 08/17/19   [provider]  levothyroxine (SYNTHROID) 125 MCG tablet Take 1  tablet by mouth daily before breakfast. 08/17/19   [provider]  losartan (COZAAR) 50 MG tablet Take 50 mg by mouth every evening.  11/08/18   [provider]  meclizine (ANTIVERT) 25 MG tablet Take 25 mg by mouth 4 (four) times daily as needed for dizziness. 06/07/20   [provider]  metFORMIN (GLUCOPHAGE-XR) 500 MG 24 hr tablet Take 1,000 mg by mouth every evening. 02/28/19   [provider]  methocarbamol (ROBAXIN) 500 MG tablet Take 1 tablet (500 mg total) by mouth 2 (two) times daily as needed for muscle spasms. 08/08/20   Noemi Chapel, MD  nitroGLYCERIN (NITROSTAT) 0.4 MG SL tablet Place 0.4 mg under the tongue every 5 (five) minutes as needed for chest pain. Reported on 10/04/2015    [provider]  oxyCODONE (OXY IR/ROXICODONE) 5 MG immediate release tablet Take 1 tablet (5 mg total) by mouth every 6 (six) hours as needed for moderate pain. 08/28/20   Mariel Aloe, MD  polyethylene glycol (MIRALAX / GLYCOLAX) packet Take 17 g by mouth daily as needed for mild constipation. Reported on 10/04/2015    [provider]  [place] X  5/16" 0.5 ML MISC  12/31/16   [provider]  ZETIA 10 MG tablet TAKE 1 TABLET BY MOUTH EVERY DAY Patient taking differently: Take 10 mg by mouth every evening. 03/05/15   Sueanne Margarita, MD    Allergies    Tetanus toxoid, adsorbed  Review of Systems   Review of Systems Unable to review systems due to dementia Physical Exam Updated Vital Signs BP (!) 129/108 (BP Location: Left Arm)   Pulse 85   Temp 97.9 F (36.6 C) (Oral)   Resp (!) 22   SpO2 96%   Physical Exam Vitals and nursing note reviewed.  Constitutional:      General: She is not in acute distress.    Appearance: She is well-developed. She is not diaphoretic.     Comments: Patient is tearful because she thinks she has been brought to jail  HENT:     Head: Normocephalic and atraumatic.  Eyes:     General: No scleral icterus.    Conjunctiva/sclera: Conjunctivae normal.  Cardiovascular:     Rate and Rhythm: Normal rate and regular rhythm.     Heart sounds: Normal heart sounds. No murmur heard. No friction rub. No gallop.   Pulmonary:     Effort: Pulmonary effort is normal. No respiratory distress.     Breath sounds: Normal breath sounds.  Abdominal:     General: Bowel sounds are normal. There is no distension.     Palpations: Abdomen is soft. There is no mass.     Tenderness: There is no abdominal tenderness. There is no guarding.  Musculoskeletal:     Cervical back: Normal range of motion.     Comments: No midline spinal tenderness on examination.  He came patch in place No pain with internal and external rotation of the right hip, no bony tenderness.  On the left there is no bony tenderness however she has pain posteriorly with internal and external rotation of the hip.  Remainder of her musculoskeletal exam is within normal limits.  There is no overt deformity or bruising.  Skin:    General: Skin is warm and dry.  Neurological:     Mental Status: She is alert and oriented to person, place, and time.   Psychiatric:        Behavior:  Behavior normal.     ED Results / Procedures / Treatments   Labs (all labs ordered are listed, but only abnormal results are displayed) Labs Reviewed - No data to display  EKG None  Radiology No results found.  Procedures Procedures  Medications Ordered in ED Medications - No data to display  ED Course  I have reviewed the triage vital signs and the nursing notes.  Pertinent labs & imaging results that were available during my care of the patient were reviewed by me and considered in my medical decision making (see chart for details).    MDM Rules/Calculators/A&P                          KC:MKLK, hip pain VS:  Vitals:   08/30/20 1745  BP: (!) 129/108  Pulse: 85  Resp: (!) 22  Temp: 97.9 F (36.6 C)  TempSrc: Oral  SpO2: 96%    JZ:PHXTAVW is gathered by [place] and daughter by phone. Previous records obtained and reviewed. DDX: bruise, sprain , fracture, radiculopathy Labs: I ordered reviewed and interpreted labs which include Cbc-mild leukocytosis likely APR Bmp- no sig abnormality  Imaging: I ordered and reviewed images which included  Plain films bl hip and pelvis and lumbar spine, CT hip. I independently visualized and interpreted all imaging. Significant findings include acute fracture of the greater trochanter of R hip.  EKG: Consults: Kathrynn Speed - No ortho surge interventions, Dr. Juleen China for admission PVX:[id number] with non-surgical hip fracture. Will need admission for pain control, pt/ot eval  And snf placement. Discussed with daughter Thurston Pounds who is in agreement.  Patient disposition:The patient appears reasonably stabilized for admission considering the current resources, flow, and capabilities available in the ED at this time, and I doubt any other [facility] requiring further screening and/or treatment in the ED prior to admission.        Final Clinical Impression(s) / ED Diagnoses Final diagnoses:  Fall     Rx / DC Orders ED Discharge Orders    None       Margarita Mail, PA-C 08/30/20 2323    Sherwood Gambler, MD 09/01/20 [phone]

## 2020-08-31 ENCOUNTER — Encounter (HOSPITAL_COMMUNITY): Payer: Self-pay | Admitting: Internal Medicine

## 2020-08-31 ENCOUNTER — Other Ambulatory Visit: Payer: Self-pay

## 2020-08-31 DIAGNOSIS — I2510 Atherosclerotic heart disease of native coronary artery without angina pectoris: Secondary | ICD-10-CM | POA: Diagnosis not present

## 2020-08-31 DIAGNOSIS — Z794 Long term (current) use of insulin: Secondary | ICD-10-CM | POA: Diagnosis not present

## 2020-08-31 DIAGNOSIS — I10 Essential (primary) hypertension: Secondary | ICD-10-CM | POA: Diagnosis not present

## 2020-08-31 DIAGNOSIS — E1159 Type 2 diabetes mellitus with other circulatory complications: Secondary | ICD-10-CM | POA: Diagnosis not present

## 2020-08-31 DIAGNOSIS — S72115A Nondisplaced fracture of greater trochanter of left femur, initial encounter for closed fracture: Secondary | ICD-10-CM | POA: Diagnosis not present

## 2020-08-31 DIAGNOSIS — R451 Restlessness and agitation: Secondary | ICD-10-CM | POA: Diagnosis not present

## 2020-08-31 DIAGNOSIS — I5032 Chronic diastolic (congestive) heart failure: Secondary | ICD-10-CM | POA: Diagnosis not present

## 2020-08-31 LAB — CBC
HCT: 41.8 % (ref 36.0–46.0)
Hemoglobin: 13.5 g/dL (ref 12.0–15.0)
MCH: 28.5 pg (ref 26.0–34.0)
MCHC: 32.3 g/dL (ref 30.0–36.0)
MCV: 88.2 fL (ref 80.0–100.0)
Platelets: 366 10*3/uL (ref 150–400)
RBC: 4.74 MIL/uL (ref 3.87–5.11)
RDW: 14.9 % (ref 11.5–15.5)
WBC: 11.8 10*3/uL — ABNORMAL HIGH (ref 4.0–10.5)
nRBC: 0 % (ref 0.0–0.2)

## 2020-08-31 LAB — BASIC METABOLIC PANEL
Anion gap: 10 (ref 5–15)
BUN: 9 mg/dL (ref 8–23)
CO2: 24 mmol/L (ref 22–32)
Calcium: 8.9 mg/dL (ref 8.9–10.3)
Chloride: 104 mmol/L (ref 98–111)
Creatinine, Ser: 0.81 mg/dL (ref 0.44–1.00)
GFR, Estimated: >60 mL/min (ref >60)
Glucose, Bld: 163 mg/dL — ABNORMAL HIGH (ref 70–99)
Potassium: 3.1 mmol/L — ABNORMAL LOW (ref 3.5–5.1)
Sodium: 138 mmol/L (ref 135–145)

## 2020-08-31 LAB — RESP PANEL BY RT-PCR (FLU A&B, COVID) ARPGX2
Influenza A by PCR: NEGATIVE
Influenza B by PCR: NEGATIVE
SARS Coronavirus 2 by RT PCR: NEGATIVE

## 2020-08-31 LAB — GLUCOSE, CAPILLARY
Glucose-Capillary: 180 mg/dL — ABNORMAL HIGH (ref 70–99)
Glucose-Capillary: 231 mg/dL — ABNORMAL HIGH (ref 70–99)
Glucose-Capillary: 229 mg/dL — ABNORMAL HIGH (ref 70–99)

## 2020-08-31 MED ORDER — INSULIN DETEMIR 100 UNIT/ML ~~LOC~~ SOLN
10.0000 [IU] | Freq: Every evening | SUBCUTANEOUS | Status: DC
  Administered 2020-08-31 – 2020-09-06 (×7): 10 [IU] via SUBCUTANEOUS
  Filled 2020-08-31 (×8): qty 0.1

## 2020-08-31 MED ORDER — POTASSIUM CHLORIDE CRYS ER 20 MEQ PO TBCR
30.0000 meq | EXTENDED_RELEASE_TABLET | ORAL | Status: AC
  Administered 2020-08-31 (×2): 30 meq via ORAL
  Filled 2020-08-31 (×2): qty 1

## 2020-08-31 MED ORDER — OLANZAPINE 5 MG PO TBDP
5.0000 mg | ORAL_TABLET | Freq: Two times a day (BID) | ORAL | Status: DC | PRN
  Administered 2020-09-02: 5 mg via ORAL
  Filled 2020-08-31 (×2): qty 1

## 2020-08-31 NOTE — Progress Notes (Signed)
Inpatient Diabetes Program Recommendations  AACE/ADA: New Consensus Statement on Inpatient Glycemic Control (2015)  Target Ranges:  Prepandial:   less than 140 mg/dL      Peak postprandial:   less than 180 mg/dL (1-2 hours)      Critically ill patients:  140 - 180 mg/dL   Lab Results  Component Value Date   GLUCAP 123 (H) 08/28/2020   HGBA1C 9.3 (H) 08/22/2020    Review of Glycemic Control  Diabetes history: DM2 Outpatient Diabetes medications: Levemir 20 units qd + Amaryl 4 mg qd + Metformin 1 gm qd Current orders for Inpatient glycemic control:  Levemir 20 units qd + Amaryl 4 mg qd + Metformin 1 gm qd + Solucortef 15 mg  Inpatient Diabetes Program Recommendations:   Please consider while in the hospital: -D/C oral diabetes medications -Decrease Levemir to 10 units q hs -Add Novolog 0-9 units tid correction Secure chat sent to Dr. Lupita Leash.  Thank you, Nani Gasser. Silver Achey, RN, MSN, CDE  Diabetes Coordinator Inpatient Glycemic Control Team Team Pager [phone] (8am-5pm) 08/31/2020 11:40 AM

## 2020-08-31 NOTE — TOC CAGE-AID Note (Signed)
Transition of Care [facility]) - CAGE-AID Screening   Patient Details  Name: Alyssa [name] MRN: [id number] Date of Birth: 1936-02-27   Elvina Sidle, RN  Trauma Response Nurse Phone Number: [phone] 08/31/2020, 2:35 PM       CAGE-AID Screening:    Have You Ever Felt You Ought to Cut Down on Your Drinking or Drug Use?: No Have People Annoyed You By Critizing Your Drinking Or Drug Use?: No Have You Felt Bad Or Guilty About Your Drinking Or Drug Use?: No Have You Ever Had a Drink or Used Drugs First Thing In The Morning to Steady Your Nerves or to Get Rid of a Hangover?: No CAGE-AID Score: 0  Substance Abuse Education Offered: No (pt adamantly denies drinking alcohol-and no drug use.)

## 2020-08-31 NOTE — TOC Initial Note (Signed)
Transition of Care [facility]) - Initial/Assessment Note    Patient Details  Name: Alyssa [name] MRN: [id number] Date of Birth: 10/16/1935  Transition of Care [facility]) CM/SW Contact:    Sharin Mons, RN Phone Number: 08/31/2020, 2:45 PM  Clinical Narrative:    Admitted with  left femur fx, after suffering a fall @home . Pt recently d/c 5/17 after hospitalization for L1 compression fracture   @ which time family declined SNF placement. NCM spoke with daughter [name] in regard to d/c planning. [name] stated once released from hospital pt's family did provide 24/7 supervision / assistance.  Stated pt fell refusing to take directive from her husband, became agitated. States her  sister that doesn't work was there with the  parents. [name] states they are now open to SNF placement @ d/c for mom. NCM explained to [name] unsure if approval for SNF placement will be received, ?  more custodial presentation. [name] stated we can @ least try, she has a fx that has to heal. Daughter reports no  preference for SNF placement . RNCM discussed insurance authorization process. [name]  expressed being hopeful for rehab and for mom to feel better soon. No further questions reported at this time. RNCM to continue to follow and assist with discharge planning needs.   Expected Discharge Plan: [place] Barriers to Discharge: Continued Medical Work up   Patient Goals and CMS Choice        Expected Discharge Plan and Services Expected Discharge Plan: [place]                                              Prior Living Arrangements/Services                       Activities of Daily Living      Permission Sought/Granted                  Emotional Assessment              Admission diagnosis:  Fall [W19.XXXA] Femur fracture, left ([place]) [S72.92XA] Closed displaced fracture of greater trochanter of right femur, initial encounter [facility])  [S72.111A] Patient Active Problem List   Diagnosis Date Noted  . Femur fracture, left ([place]) 08/30/2020  . Lumbar compression fracture ([place]) 08/22/2020  . Adrenal insufficiency ([place]) 03/09/2019  . Hypothermia   . Acute metabolic encephalopathy due to hypoglycemia   . Hypoglycemia 03/07/2019  . Arthralgia of left temporomandibular joint 02/09/2018  . Referred otalgia of left ear 02/09/2018  . Memory disorder 10/06/2017  . Insulin dependent diabetes mellitus 11/19/2016  . Chest pain 11/18/2016  . Diabetes mellitus without complication ([place]) 78/29/5621  . Dysthymia 06/20/2014  . Hypertensive heart disease 04/11/2014  . Anemia   . History of GI bleed   . CAD (coronary artery disease)   . HLD (hyperlipidemia)   . Peripheral vascular disease ([place])   . Chronic diastolic CHF (congestive heart failure) ([place])   . Essential hypertension   . NSTEMI (non-ST elevated myocardial infarction) ([place]) 04/01/2014  . Refusal of blood transfusions as patient is Jehovah's Witness 09/29/2012  . Blood loss anemia 02/26/2012  . Melena 02/25/2012  . GERD (gastroesophageal reflux disease) 11/11/2011  . DM type 2 (diabetes mellitus, type 2) ([place]) 05/26/2011  . Hypothyroid    PCP:  Ernestene Kiel, MD Pharmacy:  [place], [place] - [address] [place] [address] Phone: [phone] Fax: [phone]  [place], [place] - [address] [place] [address] Phone: [phone] Fax: [phone]     Social Determinants of Health (SDOH) Interventions    Readmission Risk Interventions Readmission Risk Prevention Plan 03/08/2019  Post Dischage Appt Not Complete  Appt Comments await discharge timing  Medication Screening Complete  Transportation Screening Complete  Some recent data might be hidden

## 2020-08-31 NOTE — Progress Notes (Addendum)
PROGRESS NOTE    Alyssa [name]  OJJ:[id number] DOB: [birth year] DOA: 08/30/2020 PCP: Ernestene Kiel, MD   Chief Complaint  Patient presents with  . Fall  Brief Narrative: 85 year old female with multiple medical comorbidities including advanced dementia, CAD/HTN/HLD, diabetes mellitus, chronic diastolic CHF, hypothyroidism, GERD, Jehovah's Witness, anemia brought to the ED after a fall.  Patient is very difficult to care, was trying to ambulate with her walker slipped and fell onto her bottom. She was seen in the ED work-up showed nondisplaced fracture of the greater trochanter of the left proximal femur.  Orthopedic was consulted advised nonsurgical intervention PT OT pain control skilled nursing facility.  Patient was admitted.  Patient has been agitated in the ED.  Subjective: Seen and examined Patient is alert awake able to tell me her name her date of birth unaware about the location and situation. Patient has been visited in the ED Pleasantly confused mildly agitated at times Able to follow commands  Assessment & Plan:  Greater intertrochanteric fracture of the left femur mildly displaced: Per orthopedic pain control PT OT nonsurgical management.  TOC consult for placement.  Hypokalemia replacement ordered  Hypothyroidism: Continue Synthroid.  Recent TSH elevated at 8.9 however was nonadherent to Synthroid at that time follow-up with 4 to 6 weeks lab.  DM type 2, recent hemoglobin A1c 9.3, continue her home Levemir 20 units, metformin, Amaryl monitor [facility] with sliding scale insulin carbohydrate diet Recent Labs  Lab 08/27/20 1228 08/27/20 1620 08/27/20 2014 08/28/20 0632 08/28/20 1151  GLUCAP 109* 148* 134* 83 123*   Chronic diastolic CHF: Monitor fluid status.  CAD/HLD/hypertension: Blood pressure is fairly controlled, continue Plavix Lipitor and Zetia Coreg and losartan  Adrenal insufficiency continue hydrocortisone  Advanced dementia with agitation behavioral  disturbance, continue Aricept Depakote Lexapro, delirium precaution, as needed Haldol.    Diet Order            Diet heart healthy/carb modified Room service appropriate? Yes; Fluid consistency: Thin  Diet effective now                Patient's There is no height or weight on file to calculate BMI.  DVT prophylaxis: enoxaparin (LOVENOX) injection 40 mg Start: 08/31/20 0800 Code Status:   Code Status: Full Code  Family Communication: plan of care discussed with patient at bedside.  Status is: Observation status  Remains hospitalized for ongoing pain control and additional control and possible skilled nursing facility placement   Dispo: The patient is from: Home              Anticipated d/c is to: SNF              Patient currently is medically stable to d/c.   Difficult to place patient No   Unresulted Labs (From admission, onward)         None     Medications reviewed:  Scheduled Meds: . atorvastatin  80 mg Oral Daily  . carvedilol  3.125 mg Oral BID  . clopidogrel  75 mg Oral Daily  . divalproex  250 mg Oral QPM  . donepezil  10 mg Oral QPM  . enoxaparin (LOVENOX) injection  40 mg Subcutaneous Q24H  . escitalopram  20 mg Oral QPM  . ezetimibe  10 mg Oral QPM  . furosemide  20 mg Oral QPM  . glimepiride  4 mg Oral QPM  . hydrocortisone  15 mg Oral QPM  . insulin detemir  20 Units Subcutaneous QPM  .  levothyroxine  125 mcg Oral QAC breakfast  . LORazepam  1 mg Intravenous Once  . losartan  50 mg Oral QPM  . metFORMIN  1,000 mg Oral QPM  . potassium chloride  30 mEq Oral Q3H   Continuous Infusions:  Consultants:see note  Procedures:see note  Antimicrobials: Anti-infectives (From admission, onward)   None     Culture/Microbiology    Component Value Date/Time   SDES URINE, RANDOM 08/23/2020 0700   SPECREQUEST NONE 08/23/2020 0700   CULT  08/23/2020 0700    NO GROWTH Performed at [facility] [address], [place], [place] [address]     REPTSTATUS 08/24/2020 FINAL 08/23/2020 0700    Other culture-see note  Objective: Vitals: Today's Vitals   08/30/20 1736 08/30/20 1745 08/31/20 0628 08/31/20 0851  BP:  (!) 129/108 (!) 164/81 (!) 159/74  Pulse:  85 83 88  Resp:  (!) 22 16 18   Temp:  97.9 F (36.6 C) 97.9 F (36.6 C) 98 F (36.7 C)  TempSrc:  Oral Oral   SpO2:  96% 98% 99%  PainSc: 4       No intake or output data in the 24 hours ending 08/31/20 1047 There were no vitals filed for this visit. Weight change:   Intake/Output from previous day: No intake/output data recorded. Intake/Output this shift: No intake/output data recorded. There were no vitals filed for this visit.  Examination: General exam: AAO x 1, older than stated age, weak appearing. HEENT:Oral mucosa moist, Ear/Nose WNL grossly,dentition normal. Respiratory system: bilaterally diminished, no crackles no use of accessory muscle, non tender. Cardiovascular system: S1 & S2 +,no JVD. Gastrointestinal system: Abdomen soft, NT,ND, BS+. Nervous System:Alert, awake, moving extremities Extremities: no edema, distal peripheral pulses palpable.  Tender left greater trochanter area on the left femur Skin: No rashes,no icterus. MSK: Normal muscle bulk,tone, power  Data Reviewed: I have personally reviewed following labs and imaging studies CBC: Recent Labs  Lab 08/30/20 2013 08/31/20 0426  WBC 12.5* 11.8*  HGB 14.1 13.5  HCT 43.8 41.8  MCV 88.7 88.2  PLT 377 841   Basic Metabolic Panel: Recent Labs  Lab 08/30/20 2013 08/31/20 0426  NA 138 138  K 3.4* 3.1*  CL 105 104  CO2 24 24  GLUCOSE 194* 163*  BUN 11 9  CREATININE 0.88 0.81  CALCIUM 8.9 8.9   GFR: Estimated Creatinine Clearance: 47.7 mL/min (by C-G formula based on SCr of 0.81 mg/dL). Liver Function Tests: No results for input(s): AST, ALT, ALKPHOS, BILITOT, PROT, ALBUMIN in the last 168 hours. No results for input(s): LIPASE, AMYLASE in the last 168 hours. No results for  input(s): AMMONIA in the last 168 hours. Coagulation Profile: No results for input(s): INR, PROTIME in the last 168 hours. Cardiac Enzymes: No results for input(s): CKTOTAL, CKMB, CKMBINDEX, TROPONINI in the last 168 hours. BNP (last 3 results) No results for input(s): PROBNP in the last 8760 hours. HbA1C: No results for input(s): HGBA1C in the last 72 hours. CBG: Recent Labs  Lab 08/27/20 1228 08/27/20 1620 08/27/20 2014 08/28/20 0632 08/28/20 1151  GLUCAP 109* 148* 134* 83 123*   Lipid Profile: No results for input(s): CHOL, HDL, LDLCALC, TRIG, CHOLHDL, LDLDIRECT in the last 72 hours. Thyroid Function Tests: No results for input(s): TSH, T4TOTAL, FREET4, T3FREE, THYROIDAB in the last 72 hours. Anemia Panel: No results for input(s): VITAMINB12, FOLATE, FERRITIN, TIBC, IRON, RETICCTPCT in the last 72 hours. Sepsis Labs: No results for input(s): PROCALCITON, LATICACIDVEN in the  last 168 hours.  Recent Results (from the past 240 hour(s))  SARS CORONAVIRUS 2 (TAT 6-24 HRS) Nasopharyngeal Nasopharyngeal Swab     Status: None   Collection Time: 08/22/20 11:13 PM   Specimen: Nasopharyngeal Swab  Result Value Ref Range Status   SARS Coronavirus 2 NEGATIVE NEGATIVE Final    Comment: (NOTE) SARS-CoV-2 target nucleic acids are NOT DETECTED.  The SARS-CoV-2 RNA is generally detectable in upper and lower respiratory specimens during the acute phase of infection. Negative results do not preclude SARS-CoV-2 infection, do not rule out co-infections with other pathogens, and should not be used as the sole basis for treatment or other patient management decisions. Negative results must be combined with clinical observations, patient history, and epidemiological information. The expected result is Negative.  Fact Sheet for Patients: SugarRoll.be  Fact Sheet for Healthcare Providers: https://www.woods-mathews.com/  This test is not yet approved  or cleared by the [place] FDA and  has been authorized for detection and/or diagnosis of SARS-CoV-2 by FDA under an Emergency Use Authorization (EUA). This EUA will remain  in effect (meaning this test can be used) for the duration of the COVID-19 declaration under Se ction 564(b)(1) of the Act, 21 U.S.C. section 360bbb-3(b)(1), unless the authorization is terminated or revoked sooner.  Performed at [facility], [place] [address], [place], [place] [address]   Urine culture     Status: None   Collection Time: 08/23/20  7:00 AM   Specimen: Urine, Catheterized  Result Value Ref Range Status   Specimen Description URINE, RANDOM  Final   Special Requests NONE  Final   Culture   Final    NO GROWTH Performed at [facility], [place] [address] [place], [place] [address]    Report Status 08/24/2020 FINAL  Final  Resp Panel by RT-PCR (Flu A&B, Covid) Nasopharyngeal Swab     Status: None   Collection Time: 08/31/20  8:04 AM   Specimen: Nasopharyngeal Swab; Nasopharyngeal(NP) swabs in vial transport medium  Result Value Ref Range Status   SARS Coronavirus 2 by RT PCR NEGATIVE NEGATIVE Final    Comment: (NOTE) SARS-CoV-2 target nucleic acids are NOT DETECTED.  The SARS-CoV-2 RNA is generally detectable in upper respiratory specimens during the acute phase of infection. The lowest concentration of SARS-CoV-2 viral copies this assay can detect is 138 copies/mL. A negative result does not preclude SARS-Cov-2 infection and should not be used as the sole basis for treatment or other patient management decisions. A negative result may occur with  improper specimen collection/handling, submission of specimen other than nasopharyngeal swab, presence of viral mutation(s) within the areas targeted by this assay, and inadequate number of viral copies(<138 copies/mL). A negative result must be combined with clinical observations, patient history, and epidemiological information. The  expected result is Negative.  Fact Sheet for Patients:  EntrepreneurPulse.com.au  Fact Sheet for Healthcare Providers:  IncredibleEmployment.be  This test is no t yet approved or cleared by the [place] FDA and  has been authorized for detection and/or diagnosis of SARS-CoV-2 by FDA under an Emergency Use Authorization (EUA). This EUA will remain  in effect (meaning this test can be used) for the duration of the COVID-19 declaration under Section 564(b)(1) of the Act, 21 U.S.C.section 360bbb-3(b)(1), unless the authorization is terminated  or revoked sooner.       Influenza A by PCR NEGATIVE NEGATIVE Final   Influenza B by PCR NEGATIVE NEGATIVE Final    Comment: (NOTE) The Xpert  Xpress SARS-CoV-2/FLU/RSV plus assay is intended as an aid in the diagnosis of influenza from Nasopharyngeal swab specimens and should not be used as a sole basis for treatment. Nasal washings and aspirates are unacceptable for Xpert Xpress SARS-CoV-2/FLU/RSV testing.  Fact Sheet for Patients: EntrepreneurPulse.com.au  Fact Sheet for Healthcare Providers: IncredibleEmployment.be  This test is not yet approved or cleared by the [place] FDA and has been authorized for detection and/or diagnosis of SARS-CoV-2 by FDA under an Emergency Use Authorization (EUA). This EUA will remain in effect (meaning this test can be used) for the duration of the COVID-19 declaration under Section 564(b)(1) of the Act, 21 U.S.C. section 360bbb-3(b)(1), unless the authorization is terminated or revoked.  Performed at [facility], [place] [address] [place], [place] [address]      Radiology Studies: DG Lumbar Spine Complete  Result Date: 08/30/2020 CLINICAL DATA:  Recent fall with low back pain, initial encounter EXAM: LUMBAR SPINE - COMPLETE 4+ VIEW COMPARISON:  08/22/2020, 08/08/2020 FINDINGS: L1 compression deformity is noted  progressed when compared with the prior CT. No new compression deformity is seen. No pars defects or anterolisthesis is seen. Mild increased kyphosis at the L1 level is noted secondary to the fracture. IMPRESSION: Mild progress in L1 compression deformity when compared with recent CT examination. Electronically Signed   By: Inez Catalina M.D.   On: 08/30/2020 19:43   CT Hip Left Wo Contrast  Result Date: 08/30/2020 CLINICAL DATA:  Left hip pain. EXAM: CT OF THE LEFT HIP WITHOUT CONTRAST TECHNIQUE: Multidetector CT imaging of the left hip was performed according to the standard protocol. Multiplanar CT image reconstructions were also generated. COMPARISON:  Radiographs of same day. FINDINGS: Mildly displaced fracture is seen involving the greater trochanter of the proximal left femur. This appears to be in a nonweightbearing area of the bone. No other fracture or dislocation is noted. No significant degenerative changes seen involving the left hip joint. No significant soft tissue abnormality is noted. IMPRESSION: Mildly displaced fracture is seen involving the greater trochanter of the proximal left femur. Electronically Signed   By: Marijo Conception M.D.   On: 08/30/2020 21:00   DG HIP UNILAT WITH PELVIS 2-3 VIEWS LEFT  Result Date: 08/30/2020 CLINICAL DATA:  Status post fall. EXAM: DG HIP (WITH OR WITHOUT PELVIS) 2-3V LEFT COMPARISON:  None. FINDINGS: In ill-defined cortical lucency is seen along the greater trochanter of the proximal left femur. There is no evidence of dislocation. Mild degenerative changes are seen within the form of joint space narrowing and acetabular sclerosis. IMPRESSION: Ill-defined cortical lucency along the greater trochanter which is of indeterminate age. Evaluation with physical exam is recommended to determine the presence of point tenderness within this region. CT correlation is recommended if acute fracture remains of clinical concern. Electronically Signed   By: Virgina Norfolk M.D.   On: 08/30/2020 19:44   DG HIP UNILAT WITH PELVIS 2-3 VIEWS RIGHT  Result Date: 08/30/2020 CLINICAL DATA:  Right hip pain following fall, initial encounter EXAM: DG HIP (WITH OR WITHOUT PELVIS) 3V RIGHT COMPARISON:  None. FINDINGS: Pelvic ring is intact. Proximal right femur appears within normal limits. No definitive fracture or dislocation is seen. No soft tissue abnormality is noted. IMPRESSION: No acute abnormality noted. Electronically Signed   By: Inez Catalina M.D.   On: 08/30/2020 19:41     LOS: 1 day   Antonieta Pert, MD [facility]  08/31/2020, 10:47 AM

## 2020-08-31 NOTE — Care Management CC44 (Signed)
Condition Code 44 Documentation Completed  Patient Details  Name: Alyssa [name] MRN: [id number] Date of Birth: 06/12/1935   Condition Code 44 given:  Yes Patient signature on Condition Code 44 notice:   (NCM spoke with daughter by phone and Alyssa [name] gave NCM the ok to sign.) Documentation of 2 MD's agreement:  Yes Code 44 added to claim:  Yes    Sharin Mons, RN 08/31/2020, 10:48 AM

## 2020-08-31 NOTE — Care Management Obs Status (Signed)
[place] NOTIFICATION   Patient Details  Name: Alyssa [name] MRN: [id number] Date of Birth: 1935/12/18   Medicare Observation Status Notification Given:  Yes    Sharin Mons, RN 08/31/2020, 10:48 AM

## 2020-08-31 NOTE — Evaluation (Signed)
Physical Therapy Evaluation Patient Details Name: Alyssa [name] MRN: [id number] DOB: [birth year] Today's Date: 08/31/2020   History of Present Illness  Pt is an 85 y/o female admitted 5/19 following fall. Found to have L hip fx, to be managed conservatively per ortho notes. PMH includes CAD, CHF, HTN, dementia, PVD, and DM.  Clinical Impression  Pt admitted secondary to problem above with deficits below. Pt poor historian and reporting conflicting information throughout session. Dementia at baseline. Pt requiring max A for bed mobility. Heavy posterior lean and requiring max A to maintain balance. Feel pt will require SNF level therapies at d/c. Will continue to follow acutely.     Follow Up Recommendations SNF;Supervision/Assistance - 24 hour    Equipment Recommendations  Wheelchair (measurements PT);Wheelchair cushion (measurements PT)    Recommendations for Other Services       Precautions / Restrictions Precautions Precautions: Fall Precaution Comments: Fall at home; No hip abduction per ortho notes Restrictions Weight Bearing Restrictions: Yes LLE Weight Bearing: Weight bearing as tolerated Other Position/Activity Restrictions: No hip abduction per ortho notes      Mobility  Bed Mobility Overal bed mobility: Needs Assistance Bed Mobility: Supine to Sit;Sit to Supine     Supine to sit: Max assist Sit to supine: Max assist   General bed mobility comments: Assist for LE and trunk assist to come to sitting. Maintained precautions throughout. Upon sitting, pt with heavy posterior lean and requiring max A to maintain sitting balance. Further mobility unsafe to attempt with +1 assist.    Transfers                    Ambulation/Gait                Stairs            Wheelchair Mobility    Modified Rankin (Stroke Patients Only)       Balance Overall balance assessment: Needs assistance Sitting-balance support: Bilateral upper extremity  supported;Feet supported Sitting balance-[name]: Zero Sitting balance - Comments: Max A for sitting balance. Posterior lean in sitting Postural control: Posterior lean                                   Pertinent Vitals/Pain Pain Assessment: Faces Faces Pain Scale: Hurts whole lot Pain Location: L hip Pain Descriptors / Indicators: Guarding;Grimacing Pain Intervention(s): Limited activity within patient's tolerance;Monitored during session;Repositioned    Home Living Family/patient expects to be discharged to:: Private residence Living Arrangements: Spouse/significant other Available Help at Discharge: Family;Available 24 hours/day Type of Home: House Home Access: Ramped entrance     Home Layout: One level Home Equipment: Bedside commode;Wheelchair - [employer] - 4 wheels Additional Comments: Information from previous session    Prior Function Level of Independence: Needs assistance   Gait / Transfers Assistance Needed: Per notes, has been using WC and RW with assist  ADL's / Homemaking Assistance Needed: Pt reports independence, however, per previous notes required assist from family        Hand Dominance        Extremity/Trunk Assessment   Upper Extremity Assessment Upper Extremity Assessment: Defer to OT evaluation    Lower Extremity Assessment Lower Extremity Assessment: LLE deficits/detail;Generalized weakness LLE Deficits / Details: Limited ROM secondary to pain    Cervical / Trunk Assessment Cervical / Trunk Assessment: Kyphotic  Communication   Communication: Other (comment) (difficult to understand at  times)  Cognition Arousal/Alertness: Awake/alert Behavior During Therapy: Anxious Overall Cognitive Status: No family/caregiver present to determine baseline cognitive functioning                                 General Comments: Dementia at baseline. Tangential throughout.      General Comments      Exercises      Assessment/Plan    PT Assessment Patient needs continued PT services  PT Problem List Decreased strength;Decreased range of motion;Decreased activity tolerance;Decreased balance;Decreased mobility;Decreased cognition;Decreased knowledge of use of DME;Decreased safety awareness;Decreased knowledge of precautions;Pain       PT Treatment Interventions DME instruction;Gait training;Stair training;Functional mobility training;Therapeutic activities;Therapeutic exercise;Balance training;Cognitive remediation;Patient/family education;Wheelchair mobility training    PT Goals (Current goals can be found in the Care Plan section)  Acute Rehab PT Goals PT Goal Formulation: Patient unable to participate in goal setting Time For Goal Achievement: 09/14/20 Potential to Achieve Goals: Fair    Frequency Min 3X/week   Barriers to discharge        Co-evaluation               AM-PAC PT "6 Clicks" Mobility  Outcome Measure Help needed turning from your back to your side while in a flat bed without using bedrails?: A Lot Help needed moving from lying on your back to sitting on the side of a flat bed without using bedrails?: Total Help needed moving to and from a bed to a chair (including a wheelchair)?: Total Help needed standing up from a chair using your arms (e.g., wheelchair or bedside chair)?: Total Help needed to walk in hospital room?: Total Help needed climbing 3-5 steps with a railing? : Total 6 Click Score: 7    End of Session   Activity Tolerance: Patient limited by pain Patient left: in bed;with call bell/phone within reach;with bed alarm set Nurse Communication: Mobility status PT Visit Diagnosis: Muscle weakness (generalized) (M62.81);Difficulty in walking, not elsewhere classified (R26.2);Pain;History of falling (Z91.81);Repeated falls (R29.6) Pain - Right/Left: Left Pain - part of body: Hip    Time: 2706-2376 PT Time Calculation (min) (ACUTE ONLY): 23  min   Charges:   PT Evaluation $PT Eval Moderate Complexity: 1 Mod PT Treatments $Therapeutic Activity: 8-22 mins        Lou Miner, DPT  Acute Rehabilitation Services  Pager: [phone] Office: [phone]   Rudean Hitt 08/31/2020, 3:32 PM

## 2020-08-31 NOTE — Plan of Care (Signed)

## 2020-09-01 DIAGNOSIS — S72111A Displaced fracture of greater trochanter of right femur, initial encounter for closed fracture: Secondary | ICD-10-CM | POA: Diagnosis not present

## 2020-09-01 DIAGNOSIS — I10 Essential (primary) hypertension: Secondary | ICD-10-CM | POA: Diagnosis not present

## 2020-09-01 DIAGNOSIS — R451 Restlessness and agitation: Secondary | ICD-10-CM | POA: Diagnosis not present

## 2020-09-01 DIAGNOSIS — S72115A Nondisplaced fracture of greater trochanter of left femur, initial encounter for closed fracture: Secondary | ICD-10-CM | POA: Diagnosis not present

## 2020-09-01 DIAGNOSIS — I2510 Atherosclerotic heart disease of native coronary artery without angina pectoris: Secondary | ICD-10-CM | POA: Diagnosis not present

## 2020-09-01 DIAGNOSIS — Z794 Long term (current) use of insulin: Secondary | ICD-10-CM | POA: Diagnosis not present

## 2020-09-01 DIAGNOSIS — E1159 Type 2 diabetes mellitus with other circulatory complications: Secondary | ICD-10-CM | POA: Diagnosis not present

## 2020-09-01 DIAGNOSIS — I5032 Chronic diastolic (congestive) heart failure: Secondary | ICD-10-CM | POA: Diagnosis not present

## 2020-09-01 LAB — GLUCOSE, CAPILLARY
Glucose-Capillary: 159 mg/dL — ABNORMAL HIGH (ref 70–99)
Glucose-Capillary: 159 mg/dL — ABNORMAL HIGH (ref 70–99)
Glucose-Capillary: 208 mg/dL — ABNORMAL HIGH (ref 70–99)
Glucose-Capillary: 197 mg/dL — ABNORMAL HIGH (ref 70–99)
Glucose-Capillary: 200 mg/dL — ABNORMAL HIGH (ref 70–99)

## 2020-09-01 LAB — BASIC METABOLIC PANEL
Anion gap: 7 (ref 5–15)
BUN: 10 mg/dL (ref 8–23)
CO2: 24 mmol/L (ref 22–32)
Calcium: 8.8 mg/dL — ABNORMAL LOW (ref 8.9–10.3)
Chloride: 106 mmol/L (ref 98–111)
Creatinine, Ser: 0.72 mg/dL (ref 0.44–1.00)
GFR, Estimated: >60 mL/min (ref >60)
Glucose, Bld: 206 mg/dL — ABNORMAL HIGH (ref 70–99)
Potassium: 3.8 mmol/L (ref 3.5–5.1)
Sodium: 137 mmol/L (ref 135–145)

## 2020-09-01 LAB — URINALYSIS, ROUTINE W REFLEX MICROSCOPIC
Bilirubin Urine: NEGATIVE
Glucose, UA: NEGATIVE mg/dL
Hgb urine dipstick: NEGATIVE
Ketones, ur: 5 mg/dL — AB
Leukocytes,Ua: NEGATIVE
Nitrite: NEGATIVE
Protein, ur: 30 mg/dL — AB
Specific Gravity, Urine: 1.018 (ref 1.005–1.030)
pH: 5 (ref 5.0–8.0)

## 2020-09-01 LAB — CBC
HCT: 39.9 % (ref 36.0–46.0)
Hemoglobin: 13.1 g/dL (ref 12.0–15.0)
MCH: 28.7 pg (ref 26.0–34.0)
MCHC: 32.8 g/dL (ref 30.0–36.0)
MCV: 87.5 fL (ref 80.0–100.0)
Platelets: 334 10*3/uL (ref 150–400)
RBC: 4.56 MIL/uL (ref 3.87–5.11)
RDW: 15.3 % (ref 11.5–15.5)
WBC: 9.9 10*3/uL (ref 4.0–10.5)
nRBC: 0 % (ref 0.0–0.2)

## 2020-09-01 NOTE — Progress Notes (Signed)
Occupational Therapy Evaluation Patient Details Name: Alyssa [name] MRN: [id number] DOB: 1935-08-12 Today's Date: 09/01/2020    History of Present Illness Pt is an 85 y/o female admitted 5/19 following fall. Found to have L hip fx, to be managed conservatively per ortho notes. PMH includes CAD, CHF, HTN, dementia, PVD, and DM.   Clinical Impression   Pt seen for re-evaluation to assess CLOF since admission. Pt asleep, easy to arouse and agreeable to session. Bed mobillity, Max A heavy cueing for hand placement and sequencing to present BLE to EOB and elevation of trunk. Mod A LB dressing- donning of R sock, OT assisted to initiated donning pt able to present to figure to complete pulling sock up. Mod A initial sit to stand with RW and Min A for stability with side step, unable to progress for simulated toilet transfer for safety due to only +1 assist, and observance of posterior in standing with RW.     Follow Up Recommendations  Supervision/Assistance - 24 hour;SNF    Equipment Recommendations  None recommended by OT    Recommendations for Other Services       Precautions / Restrictions Precautions Precautions: Fall Precaution Booklet Issued: No Precaution Comments: Fall at home; No hip abduction per ortho notes Restrictions Weight Bearing Restrictions: No LLE Weight Bearing: Weight bearing as tolerated Other Position/Activity Restrictions: No hip abduction per ortho notes      Mobility Bed Mobility Overal bed mobility: Needs Assistance Bed Mobility: Supine to Sit;Sit to Supine Rolling: Min assist (with heavy cueing to reach for bed rail verbal and visual instructions.)   Supine to sit: Max assist Sit to supine: Max assist   General bed mobility comments: Assist for LE and trunk assist to come to sitting. Maintained precautions throughout. Decreased posterior lean observed in sitting.    Transfers Overall transfer level: Needs assistance Equipment used: Rolling walker  (2 wheeled) Transfers: Sit to/from Stand Sit to Stand: Mod assist         General transfer comment: cues for hand placement, initially Mod A to power up to stand, min A standing balance and side step toward HOB with RW.    Balance Overall balance assessment: Needs assistance Sitting-balance support: Bilateral upper extremity supported;Feet supported Sitting balance-[name]: Zero Sitting balance - Comments: S to min guard for sitting balance. Postural control: Posterior lean Standing balance support: Bilateral upper extremity supported Standing balance-[name]: Poor Standing balance comment: heavy reliance on UE support to maintain standing.                           ADL either performed or assessed with clinical judgement   ADL Overall ADL's : Needs assistance/impaired Eating/Feeding: Set up;Bed level   Grooming: Minimal assistance;Sitting Grooming Details (indicate cue type and reason): requires tactile cues to initiate Upper Body Bathing: Minimal assistance;Sitting   Lower Body Bathing: Maximal assistance;Bed level   Upper Body Dressing : Bed level;Moderate assistance   Lower Body Dressing: Moderate assistance;Sitting/lateral leans Lower Body Dressing Details (indicate cue type and reason): able to perform figure 4 position with R over left. assistance need to initiate donning sock, pt able to pull up sock.               General ADL Comments: Pt able to sit EOB with initially Min guard for safety then to S., attempted simulated toilet transfer however only able to progress to side step at EOB with mod A and use  of RW.     Vision         Perception     Praxis      Pertinent Vitals/Pain Pain Assessment: Faces Faces Pain Scale: Hurts even more Pain Location: L hip Pain Descriptors / Indicators: Guarding;Grimacing Pain Intervention(s): Limited activity within patient's tolerance     Hand Dominance Right   Extremity/Trunk Assessment  Upper Extremity Assessment Upper Extremity Assessment: Generalized weakness   Lower Extremity Assessment Lower Extremity Assessment: Overall WFL for tasks assessed;Defer to PT evaluation LLE Deficits / Details: Limited ROM secondary to pain   Cervical / Trunk Assessment Cervical / Trunk Assessment: Kyphotic   Communication Communication Communication: Other (comment) (difficult to understand at times)   Cognition Arousal/Alertness: Awake/alert Behavior During Therapy: Anxious Overall Cognitive Status: No family/caregiver present to determine baseline cognitive functioning Area of Impairment: Problem solving;Following commands;Orientation;Memory;Safety/judgement                 Orientation Level: Disoriented to;Place;Time;Situation Current Attention Level: Focused Memory: Decreased recall of precautions;Decreased short-term memory Following Commands: Follows one step commands inconsistently;Follows one step commands with increased time Safety/Judgement: Decreased awareness of safety;Decreased awareness of deficits Awareness: Intellectual Problem Solving: Slow processing;Decreased initiation;Difficulty sequencing;Requires verbal cues;Requires tactile cues General Comments: Dementia at baseline. Tangential throughout.   General Comments  resident pleasantly confused able to follow commands with visual and verbal cueing.    Exercises     Shoulder Instructions      Home Living Family/patient expects to be discharged to:: Private residence Living Arrangements: Children (daughter) Available Help at Discharge: Family;Available 24 hours/day Type of Home: House Home Access: Ramped entrance     Home Layout: One level     Bathroom Shower/Tub: Walk-in shower;Door   [employer] Toilet: Standard Bathroom Accessibility: Yes How Accessible: Accessible via walker Home Equipment: Bedside commode;Wheelchair - [employer] - 4 wheels   Additional Comments: Information from  previous session, per PT. pt unable to provide accurate hx due to hx of cognitive dysfunction, referring to prior notes from daughter.      Prior Functioning/Environment Level of Independence: Needs assistance  Gait / Transfers Assistance Needed: Per notes, has been using WC and RW with assist ADL's / Homemaking Assistance Needed: Pt reports independence, however, per previous notes required assist from family   Comments: Pt's daughter reported that pt was mod I will all ADLs prior to recent fall April 27th, however since that fall she has been in a lot of pain and has required max/total A for all ADLs and mobility        OT Problem List: Decreased strength;Decreased range of motion;Decreased activity tolerance;Impaired balance (sitting and/or standing);Decreased cognition;Decreased knowledge of use of DME or AE;Decreased safety awareness;Decreased knowledge of precautions;Pain      OT Treatment/Interventions: Self-care/ADL training;Therapeutic exercise;DME and/or AE instruction;Balance training;Patient/family education    OT Goals(Current goals can be found in the care plan section) Acute Rehab OT Goals Patient Stated Goal: to go home per family OT Goal Formulation: With patient Time For Goal Achievement: 09/06/20 Potential to Achieve Goals: Fair  OT Frequency: Min 2X/week   Barriers to D/C:            Co-evaluation              AM-PAC OT "6 Clicks" Daily Activity     Outcome Measure Help from another person eating meals?: A Little Help from another person taking care of personal grooming?: A Little Help from another person toileting, which includes using toliet, bedpan, or urinal?:  A Little Help from another person bathing (including washing, rinsing, drying)?: A Lot Help from another person to put on and taking off regular upper body clothing?: A Little Help from another person to put on and taking off regular lower body clothing?: A Lot 6 Click Score: 16   End of  Session Equipment Utilized During Treatment: Gait belt;Rolling walker Nurse Communication: Mobility status;Other (comment) (skin integrity)  Activity Tolerance: Patient tolerated treatment well;Patient limited by pain Patient left: in bed;with bed alarm set;with call bell/phone within reach  OT Visit Diagnosis: Unsteadiness on feet (R26.81);Other abnormalities of gait and mobility (R26.89);Repeated falls (R29.6);Muscle weakness (generalized) (M62.81);History of falling (Z91.81);Pain Pain - Right/Left:  (back) Pain - part of body:  (back)                Time: 1020-1046 OT Time Calculation (min): 26 min Charges:  OT General Charges $OT Visit: 1 Visit OT Evaluation $OT Re-eval: 1 Re-eval OT Treatments $Self Care/Home Management : 8-22 mins  Minus Breeding, MSOT, OTR/L  Supplemental Rehabilitation Services  [phone]   Marius Ditch 09/01/2020, 11:48 AM

## 2020-09-01 NOTE — Progress Notes (Signed)
Patient ID: Alyssa [name], female   DOB: 08-29-1935, 85 y.o.   MRN: [id number] The patient is awake and alert at the bedside.  There is obvious dementia.  She does follow commands appropriately.  There is appropriate left hip pain on exam.  Her left lower extremity is well-perfused and she is able to move her toes and foot.  X-rays do show a nondisplaced fracture of the left proximal femur greater trochanter.  This was only picked up on CT scan.  The femoral neck and intertrochanteric area on the left side are intact.  This is a stable fracture pattern.  No surgical treatment is needed for this type of fracture.  Patient such as this can weight-bear as tolerated but should limit hip abduction.  She does need skilled nursing placement following this hospitalization.  Arrangements can be made for follow-up with me in 2 weeks for repeat x-rays.

## 2020-09-01 NOTE — NC FL2 (Signed)
[place] MEDICAID FL2 LEVEL OF CARE SCREENING TOOL     IDENTIFICATION  Patient Name: Alyssa [name] Birthdate: 11/20/1935 Sex: female Admission Date (Current Location): 08/30/2020  [facility] and [place] Number:  Herbalist and Address:  [facility], [place] [address] [place], [place] [address]      Provider Number: [id number]  Attending Physician Name and Address:  Antonieta Pert, MD  Relative Name and Phone Number:       Current Level of Care: Hospital Recommended Level of Care: [place] Prior Approval Number:    Date Approved/Denied:   PASRR Number: [id number]  Discharge Plan: SNF    Current Diagnoses: Patient Active Problem List   Diagnosis Date Noted  . Closed displaced fracture of greater trochanter of right femur ([place])   . Femur fracture, left ([place]) 08/30/2020  . Lumbar compression fracture ([place]) 08/22/2020  . Adrenal insufficiency ([place]) 03/09/2019  . Hypothermia   . Acute metabolic encephalopathy due to hypoglycemia   . Hypoglycemia 03/07/2019  . Arthralgia of left temporomandibular joint 02/09/2018  . Referred otalgia of left ear 02/09/2018  . Memory disorder 10/06/2017  . Insulin dependent diabetes mellitus 11/19/2016  . Chest pain 11/18/2016  . Diabetes mellitus without complication ([place]) 35/36/1443  . Dysthymia 06/20/2014  . Hypertensive heart disease 04/11/2014  . Anemia   . History of GI bleed   . CAD (coronary artery disease)   . HLD (hyperlipidemia)   . Peripheral vascular disease ([place])   . Chronic diastolic CHF (congestive heart failure) ([place])   . Essential hypertension   . NSTEMI (non-ST elevated myocardial infarction) ([place]) 04/01/2014  . Refusal of blood transfusions as patient is Jehovah's Witness 09/29/2012  . Blood loss anemia 02/26/2012  . Melena 02/25/2012  . GERD (gastroesophageal reflux disease) 11/11/2011  . DM type 2 (diabetes mellitus, type 2) ([place]) 05/26/2011  . Hypothyroid      Orientation RESPIRATION BLADDER Height & Weight     Self  Normal External catheter Weight:   Height:     BEHAVIORAL SYMPTOMS/MOOD NEUROLOGICAL BOWEL NUTRITION STATUS      Continent Diet  AMBULATORY STATUS COMMUNICATION OF NEEDS Skin   Extensive Assist Verbally Normal                       Personal Care Assistance Level of Assistance  Bathing,Feeding,Dressing Bathing Assistance: Maximum assistance Feeding assistance: Limited assistance Dressing Assistance: Maximum assistance     Functional Limitations Info  Sight,Hearing,Speech Sight Info: Adequate Hearing Info: Adequate Speech Info: Adequate    SPECIAL CARE FACTORS FREQUENCY  PT (By licensed PT),OT (By licensed OT)     PT Frequency: 5x week OT Frequency: 5x a week            Contractures Contractures Info: Not present    Additional Factors Info  Code Status,Allergies Code Status Info: full code Allergies Info: tetanus toxoid, adsorbed           Current Medications (09/01/2020):  This is the current hospital active medication list Current Facility-Administered Medications  Medication Dose Route Frequency Provider Last Rate Last Admin  . acetaminophen (TYLENOL) tablet 650 mg  650 mg Oral Q6H PRN Nicolette Bang, DO       Or  . acetaminophen (TYLENOL) suppository 650 mg  650 mg Rectal Q6H PRN Nicolette Bang, DO      . atorvastatin (LIPITOR) tablet 80 mg  80 mg Oral Daily Nicolette Bang, DO  80 mg at 09/01/20 0824  . carvedilol (COREG) tablet 3.125 mg  3.125 mg Oral BID Nicolette Bang, DO   3.125 mg at 09/01/20 1610  . clopidogrel (PLAVIX) tablet 75 mg  75 mg Oral Daily Nicolette Bang, DO   75 mg at 09/01/20 9604  . divalproex (DEPAKOTE ER) 24 hr tablet 250 mg  250 mg Oral QPM Nicolette Bang, DO   250 mg at 08/31/20 1850  . donepezil (ARICEPT) tablet 10 mg  10 mg Oral QPM Nicolette Bang, DO   10 mg at 08/31/20 1850  . enoxaparin  (LOVENOX) injection 40 mg  40 mg Subcutaneous Q24H Nicolette Bang, DO   40 mg at 09/01/20 5409  . escitalopram (LEXAPRO) tablet 20 mg  20 mg Oral QPM Nicolette Bang, DO   20 mg at 08/31/20 1850  . ezetimibe (ZETIA) tablet 10 mg  10 mg Oral QPM Nicolette Bang, DO   10 mg at 08/31/20 1850  . furosemide (LASIX) tablet 20 mg  20 mg Oral QPM Nicolette Bang, DO   20 mg at 08/31/20 1850  . hydrocortisone (CORTEF) tablet 15 mg  15 mg Oral QPM Nicolette Bang, DO   15 mg at 08/31/20 1850  . insulin detemir (LEVEMIR) injection 10 Units  10 Units Subcutaneous QPM Antonieta Pert, MD   10 Units at 08/31/20 1851  . levothyroxine (SYNTHROID) tablet 125 mcg  125 mcg Oral QAC breakfast Nicolette Bang, DO   125 mcg at 09/01/20 8119  . LORazepam (ATIVAN) injection 1 mg  1 mg Intravenous Once Harris, Abigail, PA-C      . losartan (COZAAR) tablet 50 mg  50 mg Oral QPM Nicolette Bang, DO   50 mg at 08/31/20 1850  . OLANZapine zydis (ZYPREXA) disintegrating tablet 5 mg  5 mg Oral BID PRN Kc, Ramesh, MD      . oxyCODONE (Oxy IR/ROXICODONE) immediate release tablet 5 mg  5 mg Oral Q4H PRN Nicolette Bang, DO   5 mg at 09/01/20 0825     Discharge Medications: Please see discharge summary for a list of discharge medications.  Relevant Imaging Results:  Relevant Lab Results:   Additional Information SS# [id number]  Emeterio Reeve, [place]

## 2020-09-01 NOTE — TOC Progression Note (Signed)
Transition of Care [facility]) - Progression Note    Patient Details  Name: Alyssa [name] MRN: [id number] Date of Birth: 10-Aug-1935  Transition of Care [facility]) CM/SW [place], [place] Phone Number: 09/01/2020, 1:55 PM  Clinical Narrative:    CSW spoke with pt's daughter by phone concerning SNF placement.  CSW informed pt's daughter that 2 SNF's has accepted pt at this time with a possible discharge date of Monday, May 23rd.  Pt's daughter would like to review the SNF's and get back with CSW.  TOC will continue to assist with disposition planning.   Expected Discharge Plan: Skilled Nursing Facility Barriers to Discharge: Continued Medical Work up  Expected Discharge Plan and Services Expected Discharge Plan: [place]                                               Social Determinants of Health (SDOH) Interventions    Readmission Risk Interventions Readmission Risk Prevention Plan 03/08/2019  Post Dischage Appt Not Complete  Appt Comments await discharge timing  Medication Screening Complete  Transportation Screening Complete  Some recent data might be hidden

## 2020-09-01 NOTE — Plan of Care (Signed)
  Problem: Clinical Measurements: Goal: Ability to maintain clinical measurements within normal limits will improve Outcome: Progressing   Problem: Pain Managment: Goal: General experience of comfort will improve Outcome: Progressing   Problem: Safety: Goal: Ability to remain free from injury will improve Outcome: Progressing   

## 2020-09-01 NOTE — Plan of Care (Signed)

## 2020-09-01 NOTE — Progress Notes (Signed)
Patient's daughter, Kris Mouton, took patients 4 rings home.

## 2020-09-01 NOTE — Progress Notes (Signed)
PROGRESS NOTE    Alyssa [name]  TKZ:[id number] DOB: 12/22/1935 DOA: 08/30/2020 PCP: Ernestene Kiel, MD   Chief Complaint  Patient presents with  . Fall  Brief Narrative: 85 year old female with multiple medical comorbidities including advanced dementia, CAD/HTN/HLD, diabetes mellitus, chronic diastolic CHF, hypothyroidism, GERD, Jehovah's Witness, anemia brought to the ED after a fall.  Patient is very difficult to care, was trying to ambulate with her walker slipped and fell onto her bottom. She was seen in the ED work-up showed nondisplaced fracture of the greater trochanter of the left proximal femur.  Orthopedic was consulted advised nonsurgical intervention PT OT pain control skilled nursing facility.  Patient was admitted.  Patient has been agitated in the ED. Patient admitted and followed by PT OT recommended skilled nursing facility and daughter is open to this at this time. On the last discharge 5/17 after compression fracture family declined SNF and was taken home with 24/7 caregiver  Subjective: Seen and examined.  She is alert awake able to tell me her name.  Denies any pain but has pain when passively moving her left hip.  Dr. Ninfa Linden from orthopedic surgery in the room. Afebrile overnight blood pressure 150s to 160s, saturating well on room air.  Assessment & Plan:  Greater intertrochanteric fracture of the left femur mildly displaced: Per orthopedic pain control PT OT nonsurgical management.  Will need a skilled nursing facility placement.  TOC on board.   Advanced dementia with agitation behavioral disturbance, continue Aricept Depakote Lexapro, delirium precautions, as needed Haldol/zyprexa.Appears calm this am but she can get irritated and agitated easily.  Monitor closely, keep on fall precaution and bed alarm   Hypokalemia resolved.  Hypothyroidism: On home Synthroid.  Recent TSH elevated at 8.9 however was nonadherent to Synthroid at that time follow-up with 4 to 6  weeks lab.  DM type 2, recent hemoglobin A1c 9.3, blood sugar control insulin adjusted from home Levemir 20 to 10 units, holding metformin/Amaryl, monitor [facility] with sliding scale insulin , carbohydrate controlled diet. Recent Labs  Lab 08/28/20 1151 08/31/20 1144 08/31/20 1543 08/31/20 2119 09/01/20 0704  GLUCAP 123* 180* 231* 229* 159*   Chronic diastolic CHF: Euvolemic, monitor fluid status.  CAD/HLD/hypertension: BP fairly controlled, continue Plavix Lipitor and Zetia Coreg and losartan  Adrenal insufficiency continue hydrocortisone  Diet Order            Diet heart healthy/carb modified Room service appropriate? Yes; Fluid consistency: Thin  Diet effective now                Patient's There is no height or weight on file to calculate BMI.  DVT prophylaxis: enoxaparin (LOVENOX) injection 40 mg Start: 08/31/20 0800 Code Status:   Code Status: Full Code  Family Communication: plan of care discussed with patient's daughter on the phone  Status TD:[id number] status. Remains hospitalized for ongoing pain control and additional physical therapy and skilled nursing facility placement. Dispo: The patient is from: Home              Anticipated d/c is to: SNF              Patient currently is medically stable to d/c.   Difficult to place patient No Unresulted Labs (From admission, onward)         None     Medications reviewed:  Scheduled Meds: . atorvastatin  80 mg Oral Daily  . carvedilol  3.125 mg Oral BID  . clopidogrel  75 mg Oral Daily  .  divalproex  250 mg Oral QPM  . donepezil  10 mg Oral QPM  . enoxaparin (LOVENOX) injection  40 mg Subcutaneous Q24H  . escitalopram  20 mg Oral QPM  . ezetimibe  10 mg Oral QPM  . furosemide  20 mg Oral QPM  . hydrocortisone  15 mg Oral QPM  . insulin detemir  10 Units Subcutaneous QPM  . levothyroxine  125 mcg Oral QAC breakfast  . LORazepam  1 mg Intravenous Once  . losartan  50 mg Oral QPM   Continuous  Infusions:  Consultants:see note  Procedures:see note  Antimicrobials: Anti-infectives (From admission, onward)   None     Culture/Microbiology    Component Value Date/Time   SDES URINE, RANDOM 08/23/2020 0700   SPECREQUEST NONE 08/23/2020 0700   CULT  08/23/2020 0700    NO GROWTH Performed at [facility], [place] [address] [place], [place] [address]    REPTSTATUS 08/24/2020 FINAL 08/23/2020 0700    Other culture-see note  Objective: Vitals: Today's Vitals   08/31/20 2149 08/31/20 2150 08/31/20 2334 09/01/20 0606  BP: (!) 173/90 (!) 162/83  (!) 169/70  Pulse: 96 91  72  Resp: 19   18  Temp: 98.3 F (36.8 C)   98.6 F (37 C)  TempSrc:    Oral  SpO2: 95%   95%  PainSc:   Asleep    No intake or output data in the 24 hours ending 09/01/20 0739 There were no vitals filed for this visit. Weight change:   Intake/Output from previous day: No intake/output data recorded. Intake/Output this shift: No intake/output data recorded. There were no vitals filed for this visit.  Examination: General exam: AAOx 1older than stated age, weak appearing. HEENT:Oral mucosa moist, Ear/Nose WNL grossly, dentition normal. Respiratory system: bilaterally diminished,  no use of accessory muscle Cardiovascular system: S1 & S2 +, No JVD,. Gastrointestinal system: Abdomen soft,NT,ND, BS+ Nervous System:Alert, awake, moving extremities and grossly nonfocal Extremities: no edema, distal peripheral pulses palpable. Left hip tender on movement Skin: No rashes,no icterus. MSK: thin muscle bulk,tone, power.  Data Reviewed: I have personally reviewed following labs and imaging studies CBC: Recent Labs  Lab 08/30/20 2013 08/31/20 0426 09/01/20 0410  WBC 12.5* 11.8* 9.9  HGB 14.1 13.5 13.1  HCT 43.8 41.8 39.9  MCV 88.7 88.2 87.5  PLT 377 366 409   Basic Metabolic Panel: Recent Labs  Lab 08/30/20 2013 08/31/20 0426 09/01/20 0410  NA 138 138 137  K 3.4* 3.1* 3.8  CL 105 104  106  CO2 24 24 24   GLUCOSE 194* 163* 206*  BUN 11 9 10   CREATININE 0.88 0.81 0.72  CALCIUM 8.9 8.9 8.8*   GFR: Estimated Creatinine Clearance: 48.3 mL/min (by C-G formula based on SCr of 0.72 mg/dL). Liver Function Tests: No results for input(s): AST, ALT, ALKPHOS, BILITOT, PROT, ALBUMIN in the last 168 hours. No results for input(s): LIPASE, AMYLASE in the last 168 hours. No results for input(s): AMMONIA in the last 168 hours. Coagulation Profile: No results for input(s): INR, PROTIME in the last 168 hours. Cardiac Enzymes: No results for input(s): CKTOTAL, CKMB, CKMBINDEX, TROPONINI in the last 168 hours. BNP (last 3 results) No results for input(s): PROBNP in the last 8760 hours. HbA1C: No results for input(s): HGBA1C in the last 72 hours. CBG: Recent Labs  Lab 08/28/20 1151 08/31/20 1144 08/31/20 1543 08/31/20 2119 09/01/20 0704  GLUCAP 123* 180* 231* 229* 159*   Lipid Profile: No results for  input(s): CHOL, HDL, LDLCALC, TRIG, CHOLHDL, LDLDIRECT in the last 72 hours. Thyroid Function Tests: No results for input(s): TSH, T4TOTAL, FREET4, T3FREE, THYROIDAB in the last 72 hours. Anemia Panel: No results for input(s): VITAMINB12, FOLATE, FERRITIN, TIBC, IRON, RETICCTPCT in the last 72 hours. Sepsis Labs: No results for input(s): PROCALCITON, LATICACIDVEN in the last 168 hours.  Recent Results (from the past 240 hour(s))  SARS CORONAVIRUS 2 (TAT 6-24 HRS) Nasopharyngeal Nasopharyngeal Swab     Status: None   Collection Time: 08/22/20 11:13 PM   Specimen: Nasopharyngeal Swab  Result Value Ref Range Status   SARS Coronavirus 2 NEGATIVE NEGATIVE Final    Comment: (NOTE) SARS-CoV-2 target nucleic acids are NOT DETECTED.  The SARS-CoV-2 RNA is generally detectable in upper and lower respiratory specimens during the acute phase of infection. Negative results do not preclude SARS-CoV-2 infection, do not rule out co-infections with other pathogens, and should not be used  as the sole basis for treatment or other patient management decisions. Negative results must be combined with clinical observations, patient history, and epidemiological information. The expected result is Negative.  Fact Sheet for Patients: SugarRoll.be  Fact Sheet for Healthcare Providers: https://www.woods-mathews.com/  This test is not yet approved or cleared by the [place] FDA and  has been authorized for detection and/or diagnosis of SARS-CoV-2 by FDA under an Emergency Use Authorization (EUA). This EUA will remain  in effect (meaning this test can be used) for the duration of the COVID-19 declaration under Se ction 564(b)(1) of the Act, 21 U.S.C. section 360bbb-3(b)(1), unless the authorization is terminated or revoked sooner.  Performed at [facility], [place] [address], [place], [place] [address]   Urine culture     Status: None   Collection Time: 08/23/20  7:00 AM   Specimen: Urine, Catheterized  Result Value Ref Range Status   Specimen Description URINE, RANDOM  Final   Special Requests NONE  Final   Culture   Final    NO GROWTH Performed at [facility], [place] [address] [place], [place] [address]    Report Status 08/24/2020 FINAL  Final  Resp Panel by RT-PCR (Flu A&B, Covid) Nasopharyngeal Swab     Status: None   Collection Time: 08/31/20  8:04 AM   Specimen: Nasopharyngeal Swab; Nasopharyngeal(NP) swabs in vial transport medium  Result Value Ref Range Status   SARS Coronavirus 2 by RT PCR NEGATIVE NEGATIVE Final    Comment: (NOTE) SARS-CoV-2 target nucleic acids are NOT DETECTED.  The SARS-CoV-2 RNA is generally detectable in upper respiratory specimens during the acute phase of infection. The lowest concentration of SARS-CoV-2 viral copies this assay can detect is 138 copies/mL. A negative result does not preclude SARS-Cov-2 infection and should not be used as the sole basis for treatment or other  patient management decisions. A negative result may occur with  improper specimen collection/handling, submission of specimen other than nasopharyngeal swab, presence of viral mutation(s) within the areas targeted by this assay, and inadequate number of viral copies(<138 copies/mL). A negative result must be combined with clinical observations, patient history, and epidemiological information. The expected result is Negative.  Fact Sheet for Patients:  EntrepreneurPulse.com.au  Fact Sheet for Healthcare Providers:  IncredibleEmployment.be  This test is no t yet approved or cleared by the [place] FDA and  has been authorized for detection and/or diagnosis of SARS-CoV-2 by FDA under an Emergency Use Authorization (EUA). This EUA will remain  in effect (meaning this test can  be used) for the duration of the COVID-19 declaration under Section 564(b)(1) of the Act, 21 U.S.C.section 360bbb-3(b)(1), unless the authorization is terminated  or revoked sooner.       Influenza A by PCR NEGATIVE NEGATIVE Final   Influenza B by PCR NEGATIVE NEGATIVE Final    Comment: (NOTE) The Xpert Xpress SARS-CoV-2/FLU/RSV plus assay is intended as an aid in the diagnosis of influenza from Nasopharyngeal swab specimens and should not be used as a sole basis for treatment. Nasal washings and aspirates are unacceptable for Xpert Xpress SARS-CoV-2/FLU/RSV testing.  Fact Sheet for Patients: EntrepreneurPulse.com.au  Fact Sheet for Healthcare Providers: IncredibleEmployment.be  This test is not yet approved or cleared by the [place] FDA and has been authorized for detection and/or diagnosis of SARS-CoV-2 by FDA under an Emergency Use Authorization (EUA). This EUA will remain in effect (meaning this test can be used) for the duration of the COVID-19 declaration under Section 564(b)(1) of the Act, 21 U.S.C. section  360bbb-3(b)(1), unless the authorization is terminated or revoked.  Performed at [facility], [place] [address] [place], [place] [address]      Radiology Studies: DG Lumbar Spine Complete  Result Date: 08/30/2020 CLINICAL DATA:  Recent fall with low back pain, initial encounter EXAM: LUMBAR SPINE - COMPLETE 4+ VIEW COMPARISON:  08/22/2020, 08/08/2020 FINDINGS: L1 compression deformity is noted progressed when compared with the prior CT. No new compression deformity is seen. No pars defects or anterolisthesis is seen. Mild increased kyphosis at the L1 level is noted secondary to the fracture. IMPRESSION: Mild progress in L1 compression deformity when compared with recent CT examination. Electronically Signed   By: Inez Catalina M.D.   On: 08/30/2020 19:43   CT Hip Left Wo Contrast  Result Date: 08/30/2020 CLINICAL DATA:  Left hip pain. EXAM: CT OF THE LEFT HIP WITHOUT CONTRAST TECHNIQUE: Multidetector CT imaging of the left hip was performed according to the standard protocol. Multiplanar CT image reconstructions were also generated. COMPARISON:  Radiographs of same day. FINDINGS: Mildly displaced fracture is seen involving the greater trochanter of the proximal left femur. This appears to be in a nonweightbearing area of the bone. No other fracture or dislocation is noted. No significant degenerative changes seen involving the left hip joint. No significant soft tissue abnormality is noted. IMPRESSION: Mildly displaced fracture is seen involving the greater trochanter of the proximal left femur. Electronically Signed   By: Marijo Conception M.D.   On: 08/30/2020 21:00   DG HIP UNILAT WITH PELVIS 2-3 VIEWS LEFT  Result Date: 08/30/2020 CLINICAL DATA:  Status post fall. EXAM: DG HIP (WITH OR WITHOUT PELVIS) 2-3V LEFT COMPARISON:  None. FINDINGS: In ill-defined cortical lucency is seen along the greater trochanter of the proximal left femur. There is no evidence of dislocation. Mild degenerative  changes are seen within the form of joint space narrowing and acetabular sclerosis. IMPRESSION: Ill-defined cortical lucency along the greater trochanter which is of indeterminate age. Evaluation with physical exam is recommended to determine the presence of point tenderness within this region. CT correlation is recommended if acute fracture remains of clinical concern. Electronically Signed   By: Virgina Norfolk M.D.   On: 08/30/2020 19:44   DG HIP UNILAT WITH PELVIS 2-3 VIEWS RIGHT  Result Date: 08/30/2020 CLINICAL DATA:  Right hip pain following fall, initial encounter EXAM: DG HIP (WITH OR WITHOUT PELVIS) 3V RIGHT COMPARISON:  None. FINDINGS: Pelvic ring is intact. Proximal right femur appears within normal limits.  No definitive fracture or dislocation is seen. No soft tissue abnormality is noted. IMPRESSION: No acute abnormality noted. Electronically Signed   By: Inez Catalina M.D.   On: 08/30/2020 19:41     LOS: 1 day   Antonieta Pert, MD [facility]  09/01/2020, 7:39 AM

## 2020-09-02 DIAGNOSIS — Z794 Long term (current) use of insulin: Secondary | ICD-10-CM | POA: Diagnosis not present

## 2020-09-02 DIAGNOSIS — E1159 Type 2 diabetes mellitus with other circulatory complications: Secondary | ICD-10-CM | POA: Diagnosis not present

## 2020-09-02 DIAGNOSIS — R451 Restlessness and agitation: Secondary | ICD-10-CM | POA: Diagnosis not present

## 2020-09-02 DIAGNOSIS — I5032 Chronic diastolic (congestive) heart failure: Secondary | ICD-10-CM | POA: Diagnosis not present

## 2020-09-02 DIAGNOSIS — I2510 Atherosclerotic heart disease of native coronary artery without angina pectoris: Secondary | ICD-10-CM | POA: Diagnosis not present

## 2020-09-02 DIAGNOSIS — S72115A Nondisplaced fracture of greater trochanter of left femur, initial encounter for closed fracture: Secondary | ICD-10-CM | POA: Diagnosis not present

## 2020-09-02 DIAGNOSIS — I10 Essential (primary) hypertension: Secondary | ICD-10-CM | POA: Diagnosis not present

## 2020-09-02 LAB — GLUCOSE, CAPILLARY
Glucose-Capillary: 134 mg/dL — ABNORMAL HIGH (ref 70–99)
Glucose-Capillary: 259 mg/dL — ABNORMAL HIGH (ref 70–99)

## 2020-09-02 MED ORDER — TAMSULOSIN HCL 0.4 MG PO CAPS
0.4000 mg | ORAL_CAPSULE | Freq: Every day | ORAL | Status: DC
  Administered 2020-09-02 – 2020-09-07 (×6): 0.4 mg via ORAL
  Filled 2020-09-02 (×6): qty 1

## 2020-09-02 NOTE — TOC Progression Note (Signed)
Transition of Care [facility]) - Progression Note    Patient Details  Name: Alyssa [name] MRN: [id number] Date of Birth: 04-13-1936  Transition of Care [facility]) CM/SW [place], [place] Phone Number: 09/02/2020, 3:42 PM  Clinical Narrative:    CSW spoke with pt's daughter [name] concerning SNF placement.  Pt's daughter chose [facility].  CSW accepted pt on hub.  [facility] sent Juliann Pulse at [facility] a text to let her know that pt will be discharging Monday or Tuesday.  CSW is waiting a return phone call from SNF.  CSW started insurance auth. A representative with [employer] will follow up with NCM Angela on Monday.  TOC Team will continue to assist with disposition planning.    Expected Discharge Plan: Skilled Nursing Facility Barriers to Discharge: Continued Medical Work up  Expected Discharge Plan and Services Expected Discharge Plan: [place]                                               Social Determinants of Health (SDOH) Interventions    Readmission Risk Interventions Readmission Risk Prevention Plan 03/08/2019  Post Dischage Appt Not Complete  Appt Comments await discharge timing  Medication Screening Complete  Transportation Screening Complete  Some recent data might be hidden

## 2020-09-02 NOTE — Progress Notes (Signed)
PROGRESS NOTE    Alyssa [name]  YNW:[id number] DOB: [birth year] DOA: 08/30/2020 PCP: Ernestene Kiel, MD   Chief Complaint  Patient presents with  . Fall  Brief Narrative: 85 year old female with multiple medical comorbidities including advanced dementia, CAD/HTN/HLD, diabetes mellitus, chronic diastolic CHF, hypothyroidism, GERD, Jehovah's Witness, anemia brought to the ED after a fall.  Patient is very difficult to care, was trying to ambulate with her walker slipped and fell onto her bottom. She was seen in the ED work-up showed nondisplaced fracture of the greater trochanter of the left proximal femur.  Orthopedic was consulted advised nonsurgical intervention PT OT pain control skilled nursing facility.  Patient was admitted.  Patient has been agitated in the ED. Patient admitted and followed by PT OT recommended skilled nursing facility and daughter is open to this at this time. On the last discharge 5/17 after compression fracture family declined SNF and was taken home with 24/7 caregiver  Subjective: Seen and examined this morning Needed Foley catheter placed overnight for urinary retention, no agitation.  Appears calm Afebrile overnight. Blood pressure fairlu stable at times high in 170s  Assessment & Plan:  Greater intertrochanteric fracture of the left femur mildly displaced: Per orthopedic pain control PT OT nonsurgical management.  Continue pain control, pending SNF placement.  TOC following.    Advanced dementia with agitation behavioral disturbance, continue Aricept Depakote Lexapro, delirium precautions, as needed Haldol/zyprexa.Appears calm this am but she can get irritated and agitated easily.  Monitor closely, keep on fall precaution and bed alarm   Urine retention needing Foley catheter 5/21. started on Flomax.  Voiding trial in a.m.  Hypokalemia resolved.  Hypothyroidism: Continue home Synthroid.  Recent TSH elevated at 8.9 however was nonadherent to Synthroid at  that time follow-up with 4 to 6 weeks lab.  DM type 2, recent hemoglobin A1c 9.3, well controlled  Decreased home Levemir from 20 to 10 units, holding metformin/Amaryl, cont SSI. Recent Labs  Lab 09/01/20 0704 09/01/20 1147 09/01/20 1718 09/01/20 2111 09/01/20 2236  GLUCAP 159* 159* 208* 197* 200*   Chronic diastolic CHF: Euvolemic, monitor fluid status.  CAD/HLD/hypertension: BP fairly controlled, continue Plavix Lipitor and Zetia Coreg and losartan  Adrenal insufficiency continue home hydrocortisone  Diet Order            Diet heart healthy/carb modified Room service appropriate? Yes; Fluid consistency: Thin  Diet effective now                Patient's There is no height or weight on file to calculate BMI.  DVT prophylaxis: enoxaparin (LOVENOX) injection 40 mg Start: 08/31/20 0800 Code Status:   Code Status: Full Code  Family Communication: plan of care discussed with patient's daughter on the phone 5/20 Discussed with the nursing staff.  Status MV:[id number] status. Remains hospitalized for ongoing pain control and additional physical therapy and skilled nursing facility placement. Dispo: The patient is from: Home              Anticipated d/c is to: SNF once bed available.              Patient currently is medically stable to d/c.   Difficult to place patient No Unresulted Labs (From admission, onward)         None     Medications reviewed:  Scheduled Meds: . atorvastatin  80 mg Oral Daily  . carvedilol  3.125 mg Oral BID  . clopidogrel  75 mg Oral Daily  . divalproex  250  mg Oral QPM  . donepezil  10 mg Oral QPM  . enoxaparin (LOVENOX) injection  40 mg Subcutaneous Q24H  . escitalopram  20 mg Oral QPM  . ezetimibe  10 mg Oral QPM  . furosemide  20 mg Oral QPM  . hydrocortisone  15 mg Oral QPM  . insulin detemir  10 Units Subcutaneous QPM  . levothyroxine  125 mcg Oral QAC breakfast  . LORazepam  1 mg Intravenous Once  . losartan  50 mg Oral QPM    Continuous Infusions:  Consultants:see note  Procedures:see note  Antimicrobials: Anti-infectives (From admission, onward)   None     Culture/Microbiology    Component Value Date/Time   SDES URINE, RANDOM 08/23/2020 0700   SPECREQUEST NONE 08/23/2020 0700   CULT  08/23/2020 0700    NO GROWTH Performed at [facility], [place] [address], [place], [place] [address]    REPTSTATUS 08/24/2020 FINAL 08/23/2020 0700    Other culture-see note  Objective: Vitals: Today's Vitals   09/01/20 2000 09/01/20 2109 09/02/20 0126 09/02/20 0505  BP:  (!) 160/69  (!) 141/61  Pulse:  77  74  Resp:  19  18  Temp:  97.9 F (36.6 C)  97.6 F (36.4 C)  TempSrc:  Oral  Oral  SpO2:  95%  95%  PainSc: Asleep  Asleep    No intake or output data in the 24 hours ending 09/02/20 0751 There were no vitals filed for this visit. Weight change:   Intake/Output from previous day: No intake/output data recorded. Intake/Output this shift: No intake/output data recorded. There were no vitals filed for this visit.  Examination: General exam: AAOx t oself, elderly, frail HEENT:Oral mucosa moist, Ear/Nose WNL grossly, dentition normal. Respiratory system: bilaterally clear no crackles, no use of accessory muscle Cardiovascular system: S1 & S2 +, No JVD,. Gastrointestinal system: Abdomen soft,NT,ND, BS+ Nervous System:Alert, awake, moving extremities and grossly nonfocal Extremities: no edema, distal peripheral pulses palpable.  Skin: No rashes,no icterus. MSK: Normal muscle bulk,tone, power Foley in place  Data Reviewed: I have personally reviewed following labs and imaging studies CBC: Recent Labs  Lab 08/30/20 2013 08/31/20 0426 09/01/20 0410  WBC 12.5* 11.8* 9.9  HGB 14.1 13.5 13.1  HCT 43.8 41.8 39.9  MCV 88.7 88.2 87.5  PLT 377 366 503   Basic Metabolic Panel: Recent Labs  Lab 08/30/20 2013 08/31/20 0426 09/01/20 0410  NA 138 138 137  K 3.4* 3.1* 3.8  CL 105 104 106   CO2 24 24 24   GLUCOSE 194* 163* 206*  BUN 11 9 10   CREATININE 0.88 0.81 0.72  CALCIUM 8.9 8.9 8.8*   GFR: Estimated Creatinine Clearance: 48.3 mL/min (by C-G formula based on SCr of 0.72 mg/dL). Liver Function Tests: No results for input(s): AST, ALT, ALKPHOS, BILITOT, PROT, ALBUMIN in the last 168 hours. No results for input(s): LIPASE, AMYLASE in the last 168 hours. No results for input(s): AMMONIA in the last 168 hours. Coagulation Profile: No results for input(s): INR, PROTIME in the last 168 hours. Cardiac Enzymes: No results for input(s): CKTOTAL, CKMB, CKMBINDEX, TROPONINI in the last 168 hours. BNP (last 3 results) No results for input(s): PROBNP in the last 8760 hours. HbA1C: No results for input(s): HGBA1C in the last 72 hours. CBG: Recent Labs  Lab 09/01/20 0704 09/01/20 1147 09/01/20 1718 09/01/20 2111 09/01/20 2236  GLUCAP 159* 159* 208* 197* 200*   Lipid Profile: No results for input(s): CHOL, HDL, LDLCALC, TRIG, CHOLHDL,  LDLDIRECT in the last 72 hours. Thyroid Function Tests: No results for input(s): TSH, T4TOTAL, FREET4, T3FREE, THYROIDAB in the last 72 hours. Anemia Panel: No results for input(s): VITAMINB12, FOLATE, FERRITIN, TIBC, IRON, RETICCTPCT in the last 72 hours. Sepsis Labs: No results for input(s): PROCALCITON, LATICACIDVEN in the last 168 hours.  Recent Results (from the past 240 hour(s))  Resp Panel by RT-PCR (Flu A&B, Covid) Nasopharyngeal Swab     Status: None   Collection Time: 08/31/20  8:04 AM   Specimen: Nasopharyngeal Swab; Nasopharyngeal(NP) swabs in vial transport medium  Result Value Ref Range Status   SARS Coronavirus 2 by RT PCR NEGATIVE NEGATIVE Final    Comment: (NOTE) SARS-CoV-2 target nucleic acids are NOT DETECTED.  The SARS-CoV-2 RNA is generally detectable in upper respiratory specimens during the acute phase of infection. The lowest concentration of SARS-CoV-2 viral copies this assay can detect is 138 copies/mL. A  negative result does not preclude SARS-Cov-2 infection and should not be used as the sole basis for treatment or other patient management decisions. A negative result may occur with  improper specimen collection/handling, submission of specimen other than nasopharyngeal swab, presence of viral mutation(s) within the areas targeted by this assay, and inadequate number of viral copies(<138 copies/mL). A negative result must be combined with clinical observations, patient history, and epidemiological information. The expected result is Negative.  Fact Sheet for Patients:  EntrepreneurPulse.com.au  Fact Sheet for Healthcare Providers:  IncredibleEmployment.be  This test is no t yet approved or cleared by the [place] FDA and  has been authorized for detection and/or diagnosis of SARS-CoV-2 by FDA under an Emergency Use Authorization (EUA). This EUA will remain  in effect (meaning this test can be used) for the duration of the COVID-19 declaration under Section 564(b)(1) of the Act, 21 U.S.C.section 360bbb-3(b)(1), unless the authorization is terminated  or revoked sooner.       Influenza A by PCR NEGATIVE NEGATIVE Final   Influenza B by PCR NEGATIVE NEGATIVE Final    Comment: (NOTE) The Xpert Xpress SARS-CoV-2/FLU/RSV plus assay is intended as an aid in the diagnosis of influenza from Nasopharyngeal swab specimens and should not be used as a sole basis for treatment. Nasal washings and aspirates are unacceptable for Xpert Xpress SARS-CoV-2/FLU/RSV testing.  Fact Sheet for Patients: EntrepreneurPulse.com.au  Fact Sheet for Healthcare Providers: IncredibleEmployment.be  This test is not yet approved or cleared by the [place] FDA and has been authorized for detection and/or diagnosis of SARS-CoV-2 by FDA under an Emergency Use Authorization (EUA). This EUA will remain in effect (meaning this test can  be used) for the duration of the COVID-19 declaration under Section 564(b)(1) of the Act, 21 U.S.C. section 360bbb-3(b)(1), unless the authorization is terminated or revoked.  Performed at [facility], [place] [address], [place], [place] [address]      Radiology Studies: No results found.   LOS: 1 day   Antonieta Pert, MD [facility]  09/02/2020, 7:51 AM

## 2020-09-03 DIAGNOSIS — R451 Restlessness and agitation: Secondary | ICD-10-CM | POA: Diagnosis not present

## 2020-09-03 DIAGNOSIS — I2510 Atherosclerotic heart disease of native coronary artery without angina pectoris: Secondary | ICD-10-CM | POA: Diagnosis not present

## 2020-09-03 DIAGNOSIS — I5032 Chronic diastolic (congestive) heart failure: Secondary | ICD-10-CM | POA: Diagnosis not present

## 2020-09-03 DIAGNOSIS — I10 Essential (primary) hypertension: Secondary | ICD-10-CM | POA: Diagnosis not present

## 2020-09-03 DIAGNOSIS — S72115A Nondisplaced fracture of greater trochanter of left femur, initial encounter for closed fracture: Secondary | ICD-10-CM | POA: Diagnosis not present

## 2020-09-03 DIAGNOSIS — Z794 Long term (current) use of insulin: Secondary | ICD-10-CM | POA: Diagnosis not present

## 2020-09-03 DIAGNOSIS — E1159 Type 2 diabetes mellitus with other circulatory complications: Secondary | ICD-10-CM | POA: Diagnosis not present

## 2020-09-03 LAB — GLUCOSE, CAPILLARY
Glucose-Capillary: 145 mg/dL — ABNORMAL HIGH (ref 70–99)
Glucose-Capillary: 119 mg/dL — ABNORMAL HIGH (ref 70–99)
Glucose-Capillary: 151 mg/dL — ABNORMAL HIGH (ref 70–99)
Glucose-Capillary: 151 mg/dL — ABNORMAL HIGH (ref 70–99)

## 2020-09-03 MED ORDER — INSULIN ASPART 100 UNIT/ML IJ SOLN
0.0000 [IU] | Freq: Three times a day (TID) | INTRAMUSCULAR | Status: DC
  Administered 2020-09-03 – 2020-09-07 (×6): 1 [IU] via SUBCUTANEOUS

## 2020-09-03 MED ORDER — QUETIAPINE FUMARATE 25 MG PO TABS
25.0000 mg | ORAL_TABLET | Freq: Every day | ORAL | Status: DC
  Administered 2020-09-03 – 2020-09-06 (×4): 25 mg via ORAL
  Filled 2020-09-03 (×4): qty 1

## 2020-09-03 NOTE — Progress Notes (Signed)
Physical Therapy Treatment Patient Details Name: Alyssa [name] MRN: [id number] DOB: 09-Nov-1935 Today's Date: 09/03/2020    History of Present Illness Pt is an 85 y/o female admitted 5/19 following fall. Found to have L hip fx, to be managed conservatively per ortho notes. PMH includes CAD, CHF, HTN, dementia, PVD, and DM.    PT Comments    Pt continues to be easily irritated and resistant to all movement. Pt did tolerate EOB sitting x 3 min. Pt only oriented to self and has no recall once re-oriented. Acute PT to cont to follow in hopes to progress safe mobility.    Follow Up Recommendations  SNF;Supervision/Assistance - 24 hour     Equipment Recommendations  Wheelchair (measurements PT);Wheelchair cushion (measurements PT)    Recommendations for Other Services       Precautions / Restrictions Precautions Precautions: Fall Precaution Booklet Issued: No Precaution Comments: pt with advanced dementia, easily irritated, no understanding of situation Restrictions Weight Bearing Restrictions: No LLE Weight Bearing: Weight bearing as tolerated    Mobility  Bed Mobility Overal bed mobility: Needs Assistance Bed Mobility: Supine to Sit;Sit to Supine     Supine to sit: Max assist;+2 for physical assistance Sit to supine: Max assist;+2 for physical assistance   General bed mobility comments: assist for LE management and trunk management, pt with significant retropulsion    Transfers                 General transfer comment: unable to safely complete due to signifacnt posterior lean  Ambulation/Gait                 Stairs             Wheelchair Mobility    Modified Rankin (Stroke Patients Only)       Balance Overall balance assessment: Needs assistance Sitting-balance support: Bilateral upper extremity supported;Feet supported Sitting balance-[name]: Zero Sitting balance - Comments: pt with significant retro pulsion, occasion pt would  relax with verbal and tactile cues however ultimately with signifiant posterior pusshing Postural control: Posterior lean Standing balance support: Bilateral upper extremity supported Standing balance-[name]: Poor Standing balance comment: heavy reliance on UE support to maintain standing.                            Cognition Arousal/Alertness: Awake/alert Behavior During Therapy: Agitated;Restless Overall Cognitive Status: History of cognitive impairments - at baseline                                 General Comments: dementia at baseline, pt perseverating on how poorly she's been treated here. Pt did respond well to therapist and allow for PT to attempt to sit EOB and wash her back, pt only oriented to self      Exercises      General Comments General comments (skin integrity, edema, etc.): pt with skin tears on forearms      Pertinent Vitals/Pain Pain Assessment: Faces Faces Pain Scale: Hurts even more Pain Location: L hip with movement Pain Descriptors / Indicators: Guarding;Grimacing Pain Intervention(s): Limited activity within patient's tolerance    Home Living                      Prior Function            PT Goals (current goals can now be found in  the care plan section) Progress towards PT goals: Not progressing toward goals - comment    Frequency    Min 3X/week      PT Plan Current plan remains appropriate    Co-evaluation              AM-PAC PT "6 Clicks" Mobility   Outcome Measure  Help needed turning from your back to your side while in a flat bed without using bedrails?: A Lot Help needed moving from lying on your back to sitting on the side of a flat bed without using bedrails?: Total Help needed moving to and from a bed to a chair (including a wheelchair)?: Total Help needed standing up from a chair using your arms (e.g., wheelchair or bedside chair)?: Total Help needed to walk in hospital room?:  Total Help needed climbing 3-5 steps with a railing? : Total 6 Click Score: 7    End of Session   Activity Tolerance: Treatment limited secondary to agitation Patient left: in bed;with call bell/phone within reach;with bed alarm set Nurse Communication: Mobility status PT Visit Diagnosis: Muscle weakness (generalized) (M62.81);Difficulty in walking, not elsewhere classified (R26.2);Pain;History of falling (Z91.81);Repeated falls (R29.6) Pain - Right/Left: Left Pain - part of body: Hip     Time: 3212-2482 PT Time Calculation (min) (ACUTE ONLY): 18 min  Charges:  $Therapeutic Activity: 8-22 mins                     Kittie Plater, PT, DPT Acute Rehabilitation Services Pager #: [phone] Office #: [phone]    Berline Lopes 09/03/2020, 2:30 PM

## 2020-09-03 NOTE — Progress Notes (Signed)
Pt is confused/agitated. PRN meds offered but pt refused. Pt verbally abusive to staff. "get out I dont need anything, Nope" as stated.

## 2020-09-03 NOTE — TOC Progression Note (Addendum)
Transition of Care [facility]) - Progression Note    Patient Details  Name: Alyssa [name] MRN: [id number] Date of Birth: [birth year]  [facility]) CM/SW Contact  Sharin Mons, RN Phone Number: 09/03/2020, 3:26 PM  Clinical Narrative:    NCM received call from Tammy/ [facility] informing per insurance MD advisor pt appears more custodial ( SNF denial), hx of demential .... not going to make meaningful gains with rehab.  Dr. Amalia Hailey [phone]) offered peer to peer with hospital MD. NCM made MD aware.   Expected Discharge Plan: [place] (vs [facility]) Barriers to Discharge: Continued Medical Work up  Expected Discharge Plan and Services Expected Discharge Plan: [place] (vs [facility])                                               Social Determinants of Health (SDOH) Interventions    Readmission Risk Interventions Readmission Risk Prevention Plan 03/08/2019  Post Dischage Appt Not Complete  Appt Comments await discharge timing  Medication Screening Complete  Transportation Screening Complete  Some recent data might be hidden

## 2020-09-03 NOTE — Progress Notes (Signed)
Occupational Therapy Treatment Patient Details Name: Alyssa [name] MRN: [id number] DOB: 1935-09-19 Today's Date: 09/03/2020    History of present illness Pt is an 85 y/o female admitted 5/19 following fall. Found to have L hip fx, to be managed conservatively per ortho notes. PMH includes CAD, CHF, HTN, dementia, PVD, and DM.   OT comments  Alyssa [name] was resting in bed upon arrival, oriented to self at this time. Throughout session Alyssa [name] verbalized much appreciation for care however yelled out in pain with any movement of LLE. Pt was max A for boosting in bed and rolling to adjust pads, requiring max tactile cues for bilat UE placement on bed rails to assist with rolling. Alyssa [name] was offered food and drink while supported upright in bed with max cues and A however continued to refuse. Alyssa [name] benefits from acute OT to progress functional mobility. D/c plan remains appropriate.    Follow Up Recommendations  Supervision/Assistance - 24 hour;SNF    Equipment Recommendations  None recommended by OT       Precautions / Restrictions Precautions Precautions: Fall Precaution Booklet Issued: No Precaution Comments: pt with advanced dementia, easily irritated, no understanding of situation Restrictions Weight Bearing Restrictions: No LLE Weight Bearing: Weight bearing as tolerated Other Position/Activity Restrictions: No hip abduction per ortho notes       Mobility Bed Mobility Overal bed mobility: Needs Assistance Bed Mobility: Rolling Rolling: Max assist   Supine to sit: Max assist;+2 for physical assistance Sit to supine: Max assist;+2 for physical assistance   General bed mobility comments: heavy cueing for BUE placement and use of bed rails    Transfers Overall transfer level: Needs assistance        General transfer comment: unable to transfer sitting EOB or OOB this session due to pt yelling in pain with any movement of LLE and pushing against any therapist initated movement     Balance Overall balance assessment: Needs assistance (pt refusing any EOB or OOB mobility) Sitting-balance support: Bilateral upper extremity supported;Feet supported Sitting balance-Alyssa [name] Scale: Zero Sitting balance - Comments: pt with significant retro pulsion, occasion pt would relax with verbal and tactile cues however ultimately with signifiant posterior pusshing Postural control: Posterior lean Standing balance support: Bilateral upper extremity supported Standing balance-Alyssa [name] Scale: Poor Standing balance comment: heavy reliance on UE support to maintain standing.         ADL either performed or assessed with clinical judgement   ADL Overall ADL's : Needs assistance/impaired Eating/Feeding: Set up;Bed level       Functional mobility during ADLs: Maximal assistance;Cueing for sequencing (re-positioning, bed level) General ADL Comments: pt unable to tolerate any movement, food at bedside, attempted to assist with self feeding - also refusing, pt did drink some water while supported in bed with max tactile cues               Cognition Arousal/Alertness: Awake/alert Behavior During Therapy: Agitated;Restless Overall Cognitive Status: History of cognitive impairments - at baseline         General Comments: advanced dementia at baseline, only oriented to self, pt expressing appreciation of care throughout session, attempting to "sew" and yelling in pain with any movement              General Comments pt with skin tears on bilat hands and forearms, with posey figet jacket in bed    Pertinent Vitals/ Pain       Pain Assessment: Faces Faces Pain Scale: Hurts whole lot Pain Location: L hip  with movement Pain Descriptors / Indicators: Guarding;Grimacing;Crying Pain Intervention(s): Limited activity within patient's tolerance         Frequency  Min 2X/week        Progress Toward Goals  OT Goals(current goals can now be found in the care plan section)   Progress towards OT goals: Not progressing toward goals - comment  Acute Rehab OT Goals Patient Stated Goal: to go home per family OT Goal Formulation: With patient Time For Goal Achievement: 09/06/20 Potential to Achieve Goals: Fair ADL Goals Pt Will Perform Grooming: with set-up;bed level Pt Will Perform Upper Body Dressing: with min guard assist;sitting Pt Will Perform Lower Body Dressing: with mod assist;sit to/from stand Pt Will Transfer to Toilet: with min assist;with +2 assist;stand pivot transfer;bedside commode  Plan Discharge plan remains appropriate       AM-PAC OT "6 Clicks" Daily Activity     Outcome Measure   Help from another person eating meals?: A Little Help from another person taking care of personal grooming?: A Little Help from another person toileting, which includes using toliet, bedpan, or urinal?: A Little Help from another person bathing (including washing, rinsing, drying)?: A Lot Help from another person to put on and taking off regular upper body clothing?: A Little Help from another person to put on and taking off regular lower body clothing?: A Lot 6 Click Score: 16    End of Session    OT Visit Diagnosis: Unsteadiness on feet (R26.81);Other abnormalities of gait and mobility (R26.89);Repeated falls (R29.6);Muscle weakness (generalized) (M62.81);History of falling (Z91.81);Pain Pain - Right/Left: Left Pain - part of body: Hip   Activity Tolerance Patient limited by pain;Treatment limited secondary to agitation   Patient Left in bed;with call bell/phone within reach;with bed alarm set   Nurse Communication Mobility status;Other (comment) (food at bedside, not touched)        Time: 1740-8144 OT Time Calculation (min): 14 min  Charges: OT General Charges $OT Visit: 1 Visit OT Treatments $Self Care/Home Management : 8-22 mins   Alyssa [name] 09/03/2020, 3:56 PM

## 2020-09-03 NOTE — Plan of Care (Signed)
  Problem: Education: Goal: Knowledge of General Education information will improve Description: Including pain rating scale, medication(s)/side effects and non-pharmacologic comfort measures Outcome: Progressing   Problem: Clinical Measurements: Goal: Ability to maintain clinical measurements within normal limits will improve Outcome: Progressing   Problem: Nutrition: Goal: Adequate nutrition will be maintained Outcome: Progressing   Problem: Pain Managment: Goal: General experience of comfort will improve Outcome: Progressing   Problem: Safety: Goal: Ability to remain free from injury will improve Outcome: Progressing   Problem: Skin Integrity: Goal: Risk for impaired skin integrity will decrease Outcome: Progressing   

## 2020-09-03 NOTE — Progress Notes (Addendum)
PROGRESS NOTE    Alyssa MAIOLO  CHE:[id number] DOB: 10-Aug-1935 DOA: 08/30/2020 PCP: Ernestene Kiel, MD   Chief Complaint  Patient presents with  . Fall  Brief Narrative: 85 year old female with multiple medical comorbidities including advanced dementia, CAD/HTN/HLD, diabetes mellitus, chronic diastolic CHF, hypothyroidism, GERD, Jehovah's Witness, anemia brought to the ED after a fall.  Patient is very difficult to care, was trying to ambulate with her walker slipped and fell onto her bottom. She was seen in the ED work-up showed nondisplaced fracture of the greater trochanter of the left proximal femur.  Orthopedic was consulted advised nonsurgical intervention PT OT pain control skilled nursing facility.  Patient was admitted.  Patient has been agitated in the ED. Patient admitted and followed by PT OT recommended skilled nursing facility and daughter is open to this at this time. On the last discharge 5/17 after compression fracture family declined SNF and was taken home with 24/7 caregiver. Patient remains stable at this time.  Waiting for skilled nursing facility.  Subjective: Afebrile overnight vital stable. No acute events overnight.  Patient is not agitated.  Resting comfortably.  Oriented x1 at baseline.  addendum- was agitated for Rn alter today am.  Assessment & Plan:  Greater intertrochanteric fracture of the left femur mildly displaced: Per orthopedic continue pain control PT OT and nonsurgical management.  Awaiting placement  Advanced dementia with agitation behavioral disturbance, continue Aricept Depakote Lexapro, delirium precautions, as needed Haldol/zyprexa.Appears calm this am but she can get irritated and agitated easily.  Monitor closely, keep on fall precaution and bed alarm   Urine retention needing Foley catheter 5/21. started on Flomax.  DC catheter and start voiding trial today  Hypokalemia resolved.  Hypothyroidism: Continue home Synthroid.  Recent TSH  elevated at 8.9 however was nonadherent to Synthroid at that time follow-up with 4 to 6 weeks lab.  DM type 2, recent hemoglobin A1c 9.3, well controlled on reduced dose of insulin- decreased home Levemir from 20 to 10 units, holding metformin/Amaryl while inpatient.continue CBG checks at lower since dose sensitive sliding scale Recent Labs  Lab 09/01/20 2111 09/01/20 2236 09/02/20 1233 09/02/20 2029 09/03/20 0653  GLUCAP 197* 200* 134* 259* 145*   Chronic diastolic CHF: Euvolemic, monitor fluid status.  CAD/HLD/hypertension: BP well controlled.  Continue home Plavix Lipitor and Zetia Coreg and losartan  Adrenal insufficiency continue home hydrocortisone  Addendum: at 4.50 pm  I spoke to her daughter POA- she was wondering abt taking home with full support and wanted to get idea abt prognosis- we discussed POC- get pallaitive care eval, psych eval and she is looking into taking home and manage with meds/private caregiver. Agitated today am. add scheduled seroquel bedtime and likley for am- cont prn zyprexa. reassess in am to adjust meds, f/u consult recs.  Diet Order            Diet heart healthy/carb modified Room service appropriate? Yes; Fluid consistency: Thin  Diet effective now                Patient's There is no height or weight on file to calculate BMI.  DVT prophylaxis: enoxaparin (LOVENOX) injection 40 mg Start: 08/31/20 0800 Code Status:   Code Status: Full Code  Family Communication: plan of care discussed with patient's daughter on the phone 5/20 Discussed with the nursing staff.  Status NT:[id number] status. Remains hospitalized for ongoing pain control and additional physical therapy and skilled nursing facility placement. Dispo: The patient is from: Home  Anticipated d/c is to: SNF once bed available.              Patient currently is medically stable to d/c.   Difficult to place patient No Unresulted Labs (From admission, onward)          None     Medications reviewed:  Scheduled Meds: . atorvastatin  80 mg Oral Daily  . carvedilol  3.125 mg Oral BID  . clopidogrel  75 mg Oral Daily  . divalproex  250 mg Oral QPM  . donepezil  10 mg Oral QPM  . enoxaparin (LOVENOX) injection  40 mg Subcutaneous Q24H  . escitalopram  20 mg Oral QPM  . ezetimibe  10 mg Oral QPM  . furosemide  20 mg Oral QPM  . hydrocortisone  15 mg Oral QPM  . insulin aspart  0-6 Units Subcutaneous TID WC  . insulin detemir  10 Units Subcutaneous QPM  . levothyroxine  125 mcg Oral QAC breakfast  . LORazepam  1 mg Intravenous Once  . losartan  50 mg Oral QPM  . tamsulosin  0.4 mg Oral Daily   Continuous Infusions:  Consultants:see note  Procedures:see note  Antimicrobials: Anti-infectives (From admission, onward)   None     Culture/Microbiology    Component Value Date/Time   SDES URINE, RANDOM 08/23/2020 0700   SPECREQUEST NONE 08/23/2020 0700   CULT  08/23/2020 0700    NO GROWTH Performed at [facility], [place] [address] [place], [place] [address]    REPTSTATUS 08/24/2020 FINAL 08/23/2020 0700    Other culture-see note  Objective: Vitals: Today's Vitals   09/02/20 1549 09/02/20 2028 09/03/20 0049 09/03/20 0429  BP: 135/60 (!) 144/72  (!) 133/54  Pulse: 73 84  61  Resp: 17 18  19   Temp: 98.7 F (37.1 C) 98.4 F (36.9 C)  (!) 97.4 F (36.3 C)  TempSrc: Oral Oral  Oral  SpO2: 96% 95%  97%  PainSc:  0-No pain Asleep     Intake/Output Summary (Last 24 hours) at 09/03/2020 0959 Last data filed at 09/03/2020 0431 Gross per 24 hour  Intake --  Output 1900 ml  Net -1900 ml   There were no vitals filed for this visit. Weight change:   Intake/Output from previous day: 05/22 0701 - 05/23 0700 In: -  Out: 1900 [Urine:1900] Intake/Output this shift: No intake/output data recorded. There were no vitals filed for this visit.  Examination: General exam: AAOx 1,old lady. HEENT:Oral mucosa moist, Ear/Nose WNL grossly,  dentition normal. Respiratory system: bilaterally diminished, no crackles, no use of accessory muscle Cardiovascular system: S1 & S2 +, No JVD,. Gastrointestinal system: Abdomen soft, NT,ND, BS+ Nervous System:Alert, awake, moving all extremities and grossly nonfocal, oriented to self only.   Extremities: no edema, distal peripheral pulses palpable.  Skin: No rashes,no icterus. MSK: Normal muscle bulk,tone, power  Foley in place  Data Reviewed: I have personally reviewed following labs and imaging studies CBC: Recent Labs  Lab 08/30/20 2013 08/31/20 0426 09/01/20 0410  WBC 12.5* 11.8* 9.9  HGB 14.1 13.5 13.1  HCT 43.8 41.8 39.9  MCV 88.7 88.2 87.5  PLT 377 366 824   Basic Metabolic Panel: Recent Labs  Lab 08/30/20 2013 08/31/20 0426 09/01/20 0410  NA 138 138 137  K 3.4* 3.1* 3.8  CL 105 104 106  CO2 24 24 24   GLUCOSE 194* 163* 206*  BUN 11 9 10   CREATININE 0.88 0.81 0.72  CALCIUM 8.9 8.9 8.8*  GFR: Estimated Creatinine Clearance: 48.3 mL/min (by C-G formula based on SCr of 0.72 mg/dL). Liver Function Tests: No results for input(s): AST, ALT, ALKPHOS, BILITOT, PROT, ALBUMIN in the last 168 hours. No results for input(s): LIPASE, AMYLASE in the last 168 hours. No results for input(s): AMMONIA in the last 168 hours. Coagulation Profile: No results for input(s): INR, PROTIME in the last 168 hours. Cardiac Enzymes: No results for input(s): CKTOTAL, CKMB, CKMBINDEX, TROPONINI in the last 168 hours. BNP (last 3 results) No results for input(s): PROBNP in the last 8760 hours. HbA1C: No results for input(s): HGBA1C in the last 72 hours. CBG: Recent Labs  Lab 09/01/20 2111 09/01/20 2236 09/02/20 1233 09/02/20 2029 09/03/20 0653  GLUCAP 197* 200* 134* 259* 145*   Lipid Profile: No results for input(s): CHOL, HDL, LDLCALC, TRIG, CHOLHDL, LDLDIRECT in the last 72 hours. Thyroid Function Tests: No results for input(s): TSH, T4TOTAL, FREET4, T3FREE, THYROIDAB in  the last 72 hours. Anemia Panel: No results for input(s): VITAMINB12, FOLATE, FERRITIN, TIBC, IRON, RETICCTPCT in the last 72 hours. Sepsis Labs: No results for input(s): PROCALCITON, LATICACIDVEN in the last 168 hours.  Recent Results (from the past 240 hour(s))  Resp Panel by RT-PCR (Flu A&B, Covid) Nasopharyngeal Swab     Status: None   Collection Time: 08/31/20  8:04 AM   Specimen: Nasopharyngeal Swab; Nasopharyngeal(NP) swabs in vial transport medium  Result Value Ref Range Status   SARS Coronavirus 2 by RT PCR NEGATIVE NEGATIVE Final    Comment: (NOTE) SARS-CoV-2 target nucleic acids are NOT DETECTED.  The SARS-CoV-2 RNA is generally detectable in upper respiratory specimens during the acute phase of infection. The lowest concentration of SARS-CoV-2 viral copies this assay can detect is 138 copies/mL. A negative result does not preclude SARS-Cov-2 infection and should not be used as the sole basis for treatment or other patient management decisions. A negative result may occur with  improper specimen collection/handling, submission of specimen other than nasopharyngeal swab, presence of viral mutation(s) within the areas targeted by this assay, and inadequate number of viral copies(<138 copies/mL). A negative result must be combined with clinical observations, patient history, and epidemiological information. The expected result is Negative.  Fact Sheet for Patients:  EntrepreneurPulse.com.au  Fact Sheet for Healthcare Providers:  IncredibleEmployment.be  This test is no t yet approved or cleared by the [place] FDA and  has been authorized for detection and/or diagnosis of SARS-CoV-2 by FDA under an Emergency Use Authorization (EUA). This EUA will remain  in effect (meaning this test can be used) for the duration of the COVID-19 declaration under Section 564(b)(1) of the Act, 21 U.S.C.section 360bbb-3(b)(1), unless the  authorization is terminated  or revoked sooner.       Influenza A by PCR NEGATIVE NEGATIVE Final   Influenza B by PCR NEGATIVE NEGATIVE Final    Comment: (NOTE) The Xpert Xpress SARS-CoV-2/FLU/RSV plus assay is intended as an aid in the diagnosis of influenza from Nasopharyngeal swab specimens and should not be used as a sole basis for treatment. Nasal washings and aspirates are unacceptable for Xpert Xpress SARS-CoV-2/FLU/RSV testing.  Fact Sheet for Patients: EntrepreneurPulse.com.au  Fact Sheet for Healthcare Providers: IncredibleEmployment.be  This test is not yet approved or cleared by the [place] FDA and has been authorized for detection and/or diagnosis of SARS-CoV-2 by FDA under an Emergency Use Authorization (EUA). This EUA will remain in effect (meaning this test can be used) for the duration of the COVID-19 declaration under Section  564(b)(1) of the Act, 21 U.S.C. section 360bbb-3(b)(1), unless the authorization is terminated or revoked.  Performed at [facility], [place] [address], [place], [place] [address]      Radiology Studies: No results found.   LOS: 0 days   Antonieta Pert, MD [facility]  09/03/2020, 9:59 AM

## 2020-09-04 DIAGNOSIS — Z66 Do not resuscitate: Secondary | ICD-10-CM | POA: Diagnosis not present

## 2020-09-04 DIAGNOSIS — R627 Adult failure to thrive: Secondary | ICD-10-CM | POA: Diagnosis not present

## 2020-09-04 DIAGNOSIS — I5032 Chronic diastolic (congestive) heart failure: Secondary | ICD-10-CM | POA: Diagnosis not present

## 2020-09-04 DIAGNOSIS — S72111A Displaced fracture of greater trochanter of right femur, initial encounter for closed fracture: Secondary | ICD-10-CM | POA: Diagnosis not present

## 2020-09-04 DIAGNOSIS — I2510 Atherosclerotic heart disease of native coronary artery without angina pectoris: Secondary | ICD-10-CM | POA: Diagnosis not present

## 2020-09-04 DIAGNOSIS — R413 Other amnesia: Secondary | ICD-10-CM | POA: Diagnosis not present

## 2020-09-04 DIAGNOSIS — I10 Essential (primary) hypertension: Secondary | ICD-10-CM | POA: Diagnosis not present

## 2020-09-04 DIAGNOSIS — Z515 Encounter for palliative care: Secondary | ICD-10-CM

## 2020-09-04 DIAGNOSIS — R451 Restlessness and agitation: Secondary | ICD-10-CM | POA: Diagnosis not present

## 2020-09-04 DIAGNOSIS — Z794 Long term (current) use of insulin: Secondary | ICD-10-CM | POA: Diagnosis not present

## 2020-09-04 DIAGNOSIS — S72115A Nondisplaced fracture of greater trochanter of left femur, initial encounter for closed fracture: Secondary | ICD-10-CM | POA: Diagnosis not present

## 2020-09-04 DIAGNOSIS — E1159 Type 2 diabetes mellitus with other circulatory complications: Secondary | ICD-10-CM | POA: Diagnosis not present

## 2020-09-04 DIAGNOSIS — R531 Weakness: Secondary | ICD-10-CM | POA: Diagnosis not present

## 2020-09-04 LAB — GLUCOSE, CAPILLARY
Glucose-Capillary: 91 mg/dL (ref 70–99)
Glucose-Capillary: 123 mg/dL — ABNORMAL HIGH (ref 70–99)
Glucose-Capillary: 188 mg/dL — ABNORMAL HIGH (ref 70–99)
Glucose-Capillary: 148 mg/dL — ABNORMAL HIGH (ref 70–99)

## 2020-09-04 LAB — VALPROIC ACID LEVEL: Valproic Acid Lvl: 26 ug/mL — ABNORMAL LOW (ref 50.0–100.0)

## 2020-09-04 NOTE — Plan of Care (Signed)

## 2020-09-04 NOTE — Consult Note (Signed)
Consultation Note Date: 09/04/2020   Patient Name: Alyssa [name]  DOB: 02-May-1935  MRN: [id number]  Age / Sex: 85 y.o., female  PCP: Ernestene Kiel, MD Referring Physician: Antonieta Pert, MD  Reason for Consultation: Establishing goals of care and Psychosocial/spiritual support  HPI/Patient Profile: 85 y.o. female  with past medical history of advanced dementia, CAD/HTN/HLD, diabetes mellitus, chronic diastolic CHF, hypothyroidism, GERD, anemia admitted on 08/30/2020 after a fall.  ED work-up showed nondisplaced fracture of the left femur, orthopedic recommended nonsurgical intervention.  She is a Restaurant manager, fast food.   Today is day 4 of this hospital stay.   Family face treatment option decisions, advanced directive decisions, anticipatory care needs.   Clinical Assessment and Goals of Care:  This NP Wadie Lessen reviewed medical records, received report from team, assessed the patient. Due to the patient's dementia I was unable to meaningfully discuss diagnosis, prognosis, GOC, EOL wishes disposition and options.  Called the patient daughter Alyssa [name]) when concept of Palliative Care was introduced as specialized medical care for people and their families living with serious illness. If focuses on providing relief from the symptoms and stress of a serious illness. The goal is to improve quality of life for both the patient and the family.   Values and goals of care important to patient and family were attempted to be elicited.  Created space and opportunity for Alyssa Laming to explore thoughts and feelings regarding current medical situation. She states her mom has been miserable, agitated, and difficult to care for. However, they prefer to try and continue care at home with medications rather than admit to a SNF.   A brief discussion was had today regarding advanced directives and, specifically code status. Unable to  complete full discussion due to Alyssa Laming at the doctor's office for laser procedure.     Code status updated. MOST form will be addressed at follow-up discussion.   PMT will continue to support holistically.    HCPOA    SUMMARY OF RECOMMENDATIONS    Code Status/Advance Care Planning:  DNR  Palliative Prophylaxis:   Frequent Pain Assessment  Additional Recommendations (Limitations, Scope, Preferences):  Full Scope Treatment until further discussions with family   Psycho-social/Spiritual:   Desire for further Chaplaincy support: unable to answer  Additional Recommendations: Education on Hospice  Prognosis:   Unable to determine  Discharge Planning: To Be Determined      Primary Diagnoses: Present on Admission: . Femur fracture, left ([place]) . CAD (coronary artery disease) . Hypothyroid . Essential hypertension . Chronic diastolic CHF (congestive heart failure) ([place]) . HLD (hyperlipidemia) . Memory disorder   I have reviewed the medical record, interviewed the patient and family, and examined the patient. The following aspects are pertinent.  Past Medical History:  Diagnosis Date  . Anemia   . CAD (coronary artery disease)    a. 2013 NSTEMI s/p DES to LAD. b. 04/03/14: NSTEMI s/p DES to OM1, DES to dRCA.  [name] Chronic diastolic CHF (congestive heart failure) ([place])    a. 2D ECHO 04/02/14 with hypokinesis in the basal inferior and inferoseptal walls. Focal and moderate concentric LVH. Overall LVEF 60-65%. G1DD. Elevated EDLV filling pressures. Mild TR.  . Diverticulosis   . GERD (gastroesophageal reflux disease)   . History of GI bleed    a. 2013: adm for ABL anemia. 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  [name] HLD (hyperlipidemia)   . HTN (hypertension)   .  Hypothyroid   . Memory disorder 10/06/2017  . Peripheral vascular disease ([place])   . Refusal of blood  transfusions as patient is Jehovah's Witness 09-29-12  . Serrated polyp of colon 2013  . Type II diabetes mellitus ([place])    Social History   Socioeconomic History  . Marital status: Married    Spouse name: Not on file  . Number of children: Not on file  . Years of education: Not on file  . Highest education level: Not on file  Occupational History  . Not on file  Tobacco Use  . Smoking status: Former Smoker    Packs/day: 1.50    Years: 16.00    Pack years: 24.00    Types: Cigarettes    Quit date: 03/15/1970    Years since quitting: 50.5  . Smokeless tobacco: Never Used  Vaping Use  . Vaping Use: Never used  Substance and Sexual Activity  . Alcohol use: Yes    Alcohol/week: 3.0 - 4.0 standard drinks    Types: 3 - 4 Standard drinks or equivalent per week    Comment: 04/03/2014 "glass of wine or a beer 2-3 times/month"  . Drug use: No  . Sexual activity: Not Currently  Other Topics Concern  . Not on file  Social History Narrative   Lives with husband   Caffeine use: Coffee daily   Right handed   Social Determinants of Health   Financial Resource Strain: Not on file  Food Insecurity: Not on file  Transportation Needs: Not on file  Physical Activity: Not on file  Stress: Not on file  Social Connections: Not on file   Family History  Problem Relation Age of Onset  . Arthritis Father   . Heart disease Mother   . Varicose Veins Mother   . Hypertension Other    Scheduled Meds: . atorvastatin  80 mg Oral Daily  . carvedilol  3.125 mg Oral BID  . clopidogrel  75 mg Oral Daily  . divalproex  250 mg Oral QPM  . donepezil  10 mg Oral QPM  . enoxaparin (LOVENOX) injection  40 mg Subcutaneous Q24H  . escitalopram  20 mg Oral QPM  . ezetimibe  10 mg Oral QPM  . hydrocortisone  15 mg Oral QPM  . insulin aspart  0-6 Units Subcutaneous TID WC  . insulin detemir  10 Units Subcutaneous QPM  . levothyroxine  125 mcg Oral QAC breakfast  . LORazepam  1 mg Intravenous Once  .  losartan  50 mg Oral QPM  . QUEtiapine  25 mg Oral QHS  . tamsulosin  0.4 mg Oral Daily   Continuous Infusions: PRN Meds:.acetaminophen **OR** acetaminophen, oxyCODONE Medications Prior to Admission:  Prior to Admission medications   Medication Sig Start Date End Date Taking? Authorizing Provider  atorvastatin (LIPITOR) 80 MG tablet TAKE 1 TABLET BY MOUTH ONCE DAILY. Patient taking differently: Take 80 mg by mouth every evening. 12/22/17  Yes Turner, Eber Hong, MD  carvedilol (COREG) 3.125 MG tablet TAKE 1 TABLET BY MOUTH TWICE DAILY Patient taking differently: Take 3.125 mg by mouth every evening. 11/17/17  Yes Turner, Eber Hong, MD  clopidogrel (PLAVIX) 75 MG tablet TAKE 1 TABLET BY MOUTH ONCE DAILY. Patient taking differently: Take 75 mg by mouth every evening. 11/17/17  Yes Turner, Eber Hong, MD  divalproex (DEPAKOTE ER) 250 MG 24 hr tablet Take 250 mg by mouth every evening.   Yes [provider]  donepezil (ARICEPT) 10 MG tablet Take 10 mg  by mouth every evening. 08/17/19  Yes [provider]  escitalopram (LEXAPRO) 20 MG tablet Take 1 tablet (20 mg total) by mouth every evening. 03/09/19  Yes Al Decant, MD  furosemide (LASIX) 20 MG tablet TAKE 1 TABLET BY MOUTH ONCE DAILY. Patient taking differently: Take 20 mg by mouth every evening. 12/22/17  Yes Turner, Traci R, MD  glimepiride (AMARYL) 4 MG tablet Take 4 mg by mouth every evening.   Yes [provider]  hydrocortisone (CORTEF) 5 MG tablet Take 15 mg by mouth every evening. 08/17/19  Yes [provider]  LEVEMIR FLEXTOUCH 100 UNIT/ML FlexPen Inject 20 Units into the skin every evening. 08/17/19  Yes [provider]  levothyroxine (SYNTHROID) 125 MCG tablet Take 125 mcg by mouth daily before breakfast. 08/17/19  Yes [provider]  losartan (COZAAR) 50 MG tablet Take 50 mg by mouth every evening.  11/08/18  Yes [provider]  meclizine (ANTIVERT) 25 MG tablet Take 25 mg by mouth 4  (four) times daily as needed for dizziness. 06/07/20  Yes [provider]  metFORMIN (GLUCOPHAGE-XR) 500 MG 24 hr tablet Take 1,000 mg by mouth every evening. 02/28/19  Yes [provider]  nitroGLYCERIN (NITROSTAT) 0.4 MG SL tablet Place 0.4 mg under the tongue every 5 (five) minutes as needed for chest pain. Reported on 10/04/2015   Yes [provider]  oxyCODONE (OXY IR/ROXICODONE) 5 MG immediate release tablet Take 1 tablet (5 mg total) by mouth every 6 (six) hours as needed for moderate pain. 08/28/20  Yes Mariel Aloe, MD  polyethylene glycol (MIRALAX / GLYCOLAX) packet Take 17 g by mouth daily as needed for mild constipation. Reported on 10/04/2015   Yes [provider]  hydrOXYzine (ATARAX/VISTARIL) 25 MG tablet Take 12.5 mg by mouth every 8 (eight) hours as needed for anxiety. 08/30/20   [provider]  methocarbamol (ROBAXIN) 500 MG tablet Take 1 tablet (500 mg total) by mouth 2 (two) times daily as needed for muscle spasms. Patient not taking: No sig reported 08/08/20   Noemi Chapel, MD  TRUEPLUS INSULIN SYRINGE 31G X 5/16" 0.5 ML MISC  12/31/16   [provider]  ZETIA 10 MG tablet TAKE 1 TABLET BY MOUTH EVERY DAY Patient not taking: No sig reported 03/05/15   Sueanne Margarita, MD   Allergies  Allergen Reactions  . Tetanus Toxoid, Adsorbed Swelling and Rash    Site of injection swells   Review of Systems  Unable to perform ROS Musculoskeletal:       Denies pain at this time    Physical Exam Constitutional:      General: She is sleeping.  HENT:     Mouth/Throat:     Comments: Able to drink from straw when prompted Neurological:     Mental Status: She is easily aroused. She is disoriented.  Psychiatric:        Behavior: Behavior is not agitated, aggressive or combative.        Cognition and Memory: Memory is impaired.     Vital Signs: BP (!) 144/69 (BP Location: Right Arm)   Pulse 71   Temp (!) 97.3 F (36.3 C)    Resp 16   SpO2 97%  Pain Scale: PAINAD POSS *See Group Information*: S-Acceptable,Sleep, easy to arouse Pain Score: Asleep   SpO2: SpO2: 97 % O2 Device:SpO2: 97 % O2 Flow Rate: .   IO: Intake/output summary:   Intake/Output Summary (Last 24 hours) at 09/04/2020 1300 Last  data filed at 09/04/2020 0900 Gross per 24 hour  Intake 0 ml  Output 650 ml  Net -650 ml    LBM: Last BM Date:  (Prior to admission, patient unable to remember) Baseline Weight:   Most recent weight:       Palliative Assessment/Data: 30%   PMT will talk with daughter tomorrow at 0900 for onging discussion   Time In: 12:10 Time Out: 1:00 Time Total: 50 min Greater than 50%  of this time was spent counseling and coordinating care related to the above assessment and plan.  Signed by: Wadie Lessen, NP   Please contact Palliative Medicine Team phone at [phone] for questions and concerns.  For individual provider: See Shea Evans

## 2020-09-04 NOTE — TOC Progression Note (Signed)
Transition of Care [facility]) - Progression Note    Patient Details  Name: Alyssa [name] MRN: [id number] Date of Birth: [birth year]  Transition of [facility]) CM/SW Contact  Sharin Mons, RN Phone Number: 09/04/2020, 11:26 AM  Clinical Narrative:    Per MD not medically ready for d/c (introduction of new  Medication, palliatives consult).  NCM spoke with daughter Alyssa [name] in regard to d/c plan. Alyssa [name] stated after speaking with brother they have decided pt will d/c to home. Agreeable to resumption of  home health services, [facility]. States they are going to hire NA to provide PCS , 8-12 hrs a day, remaining hrs family will assist with care.  TOC team will continue to monitor and assist with needs...  Expected Discharge Plan: [place] Barriers to Discharge: Continued Medical Work up  Expected Discharge Plan and Services Expected Discharge Plan: [place]                                               Social Determinants of Health (SDOH) Interventions    Readmission Risk Interventions Readmission Risk Prevention Plan 03/08/2019  Post Dischage Appt Not Complete  Appt Comments await discharge timing  Medication Screening Complete  Transportation Screening Complete  Some recent data might be hidden

## 2020-09-04 NOTE — Consult Note (Signed)
85 year old female with multiple medical comorbidities including advanced dementia, CAD/HTN/HLD, diabetes mellitus, chronic diastolic CHF, hypothyroidism, GERD, Jehovah's Witness, anemia brought to the ED after a fall.  Patient is very difficult to care, was trying to ambulate with her walker slipped and fell onto her bottom. She was seen in the ED work-up showed nondisplaced fracture of the greater trochanter of the left proximal femur.  Orthopedic was consulted advised nonsurgical intervention PT OT pain control skilled nursing facility.  Patient was admitted.  Patient has been agitated in the ED. Patient admitted and followed by PT OT recommended skilled nursing facility and daughter is open to this at this time. On the last discharge 5/17 after compression fracture family declined SNF and was taken home with 24/7 caregiver. Patient remains stable at this time.  Waiting for skilled nursing facility.   Placed for agitation and medication management.  Palliative care has also been consulted for goals of care.  Chart review indicates the plan is to discharge patient home with daughter after palliative care evaluation and psychiatric evaluation, and is concerned about appropriate medication management at home for [facility] caregiver as patient continues to be agitated.  At the time of this evaluation patient is currently on Depakote ER 250 mg p.o. nightly, Lexapro 20 mg p.o. daily, Seroquel 25 mg p.o. nightly, and olanzapine 5 mg p.o. twice daily prn for agitation.  Labs reviewed all within normal with the exception of TSH which shows hypothyroidism(TSH 98.97) on May 11 previous admission, she is currently taking levothyroxine 125 mcg p.o. every morning.   Patient is seen and attempted to assess and evaluate, unable to participate in meaningful psychiatric evaluation.  Chart review does indicate the patient has been verbally attacking staff, refusing care, intermittently yelling and screaming in pain at times,  brief periods of agitation.  Patient is currently on appropriate medication management for agitation.    Agitation : if she continues to exhibit disruptive behaviors, agitation, and intermittent periods of confusion recommend increasing Seroquel to 25 mg p.o. twice daily.  She also has as needed order for olanzapine Zydis 5 mg p.o. twice daily as needed for agitation this appears to only have been administered x1.  Will discontinue at this time as patient currently does not need 2 antipsychotics for agitation. -She is currently taking Depakote 250 mg ER p.o. nightly as a home medication.  Will order valproic acid level for today, an increased dose if not within goal.  -Dementia with behavioral disturbances appears to be stable.  Currently taking escitalopram 20 mg which is appropriate medication for management of this condition.  May benefit from education of family and caregiver to talk about disease progression and expected behaviors with advanced dementia.  As noted above may increase Seroquel to 25 mg p.o. twice daily if patient begins to exhibit disruptive behaviors during the day.  Continue to limit use of restraints, encourage early mobilization, and utilize of safety sitter to reduce restraints.  Hypothyroidism: -Recommend appropriate management of hypothyroidism as it can also contribute to her mental slowness, irritability, emotional lability, fatigue.  Will order EKG.  Do not recommend initiation of any additional antipsychotics at this time as patient has cardiac history and risk for worsening prolonged QTc. Patient behaviors are consistent with a person with dementia, and likely acute delirium in setting of fall, AMS. -Continue to recommend safety sitter, and limit use of restraints in this patient.   Psychiatry to sign off once labs result. Please re consult if any additional concerns or  further services warranted.

## 2020-09-04 NOTE — Progress Notes (Addendum)
PROGRESS NOTE    Alyssa [name]  DPO:[id number] DOB: November 10, 1935 DOA: 08/30/2020 PCP: Ernestene Kiel, MD   Chief Complaint  Patient presents with  . Fall  Brief Narrative: 85 year old female with multiple medical comorbidities including advanced dementia, CAD/HTN/HLD, diabetes mellitus, chronic diastolic CHF, hypothyroidism, GERD, Jehovah's Witness, anemia brought to the ED after a fall.  Patient is very difficult to care, was trying to ambulate with her walker slipped and fell onto her bottom. She was seen in the ED work-up showed nondisplaced fracture of the greater trochanter of the left proximal femur.  Orthopedic was consulted advised nonsurgical intervention PT OT pain control skilled nursing facility.  Patient was admitted.  Patient has been agitated in the ED. Patient admitted and followed by PT OT recommended skilled nursing facility and daughter is open to this at this time. On the last discharge 5/17 after compression fracture family declined SNF and was taken home with 24/7 caregiver. Patient remains stable at this time.  Waiting for skilled nursing facility.  Subjective: Patient needed in and out catheterization.  She was resting sleeping comfortably when I saw her this morning.  Unable to tell me where she is  Assessment & Plan:  Greater intertrochanteric fracture of the left femur mildly displaced: Per orthopedic continue pain control PT OT and nonsurgical management.Awaiting placement  Agitation in the setting of dementia with behavioral disturbance Advanced dementia with agitation behavioral disturbance: Appreciate psychiatric input-was a started on Seroquel 25 mg bedtime- psych consulted to adjust meds. can increase to twice daily if needed, stopped prn  Zyprexa.  Continue supportive measures fall precaution.  Patient family looking into taking her home so we are adjusting medication.  Urine retention needing Foley catheter 5/21. started on Flomax.  off Foley catheter  but needing in and out cath, she will likely need to go home with Foley catheter monitor bladder scan today discussed nursing staff.   Hypokalemia resolved.  Hypothyroidism: Continue home Synthroid.  Recent TSH elevated at 8.9 however was nonadherent to Synthroid at that time follow-up with 4 to 6 weeks lab.  DM type 2, recent hemoglobin A1c 9.3, well controlled on reduced dose of insulin- decreased home Levemir from 20 to 10 units, holding metformin/Amaryl while inpatient.continue to monitor blood glucose and continue sliding scale insulin. Recent Labs  Lab 09/03/20 1152 09/03/20 1640 09/03/20 2037 09/04/20 0614 09/04/20 1150  GLUCAP 119* 151* 151* 91 123*   Chronic diastolic CHF: Stable monitor fluid status.  CAD/HLD/hypertension: BP is fairly controlled continue home Coreg losartan.  Continue her Plavix, Zetia  Lipitor  Adrenal insufficiency: on po hydrocortisone  Goals of care: Currently full code, after discussion with patient's daughter [facility] care consultation requested.   Diet Order            Diet heart healthy/carb modified Room service appropriate? Yes; Fluid consistency: Thin  Diet effective now                Patient's There is no height or weight on file to calculate BMI.  DVT prophylaxis: enoxaparin (LOVENOX) injection 40 mg Start: 08/31/20 0800 Code Status:   Code Status: Full Code  Family Communication: plan of care discussed with patient's daughter on the phone 5/20 Discussed with the nursing staff.  Status ER:[id number] status. Remains hospitalized for ongoing pain control and additional physical therapy and skilled nursing facility placement. Dispo: The patient is from: Home              Anticipated d/c is to: Family planning  to take her home with home health and private caregiver 24/7.  Currently adjusting her medication for agitation so that she is safe to return home              Patient currently is not medically stable.   Difficult to place  patient No Unresulted Labs (From admission, onward)         None     Medications reviewed:  Scheduled Meds: . atorvastatin  80 mg Oral Daily  . carvedilol  3.125 mg Oral BID  . clopidogrel  75 mg Oral Daily  . divalproex  250 mg Oral QPM  . donepezil  10 mg Oral QPM  . enoxaparin (LOVENOX) injection  40 mg Subcutaneous Q24H  . escitalopram  20 mg Oral QPM  . ezetimibe  10 mg Oral QPM  . hydrocortisone  15 mg Oral QPM  . insulin aspart  0-6 Units Subcutaneous TID WC  . insulin detemir  10 Units Subcutaneous QPM  . levothyroxine  125 mcg Oral QAC breakfast  . LORazepam  1 mg Intravenous Once  . losartan  50 mg Oral QPM  . QUEtiapine  25 mg Oral QHS  . tamsulosin  0.4 mg Oral Daily   Continuous Infusions:  Consultants:see note  Procedures:see note  Antimicrobials: Anti-infectives (From admission, onward)   None     Culture/Microbiology    Component Value Date/Time   SDES URINE, RANDOM 08/23/2020 0700   SPECREQUEST NONE 08/23/2020 0700   CULT  08/23/2020 0700    NO GROWTH Performed at [facility] [address], [place], [place] [address]    REPTSTATUS 08/24/2020 FINAL 08/23/2020 0700    Other culture-see note  Objective: Vitals: Today's Vitals   09/03/20 2027 09/04/20 0621 09/04/20 0715 09/04/20 0751  BP: 134/77 132/60  (!) 144/69  Pulse: 84 69  71  Resp: 16 16  16   Temp: 98.2 F (36.8 C) 97.6 F (36.4 C)  (!) 97.3 F (36.3 C)  TempSrc: Oral Oral    SpO2: 96% 97%  97%  PainSc:   Asleep     Intake/Output Summary (Last 24 hours) at 09/04/2020 1255 Last data filed at 09/04/2020 0900 Gross per 24 hour  Intake 0 ml  Output 650 ml  Net -650 ml   There were no vitals filed for this visit. Weight change:   Intake/Output from previous day: 05/23 0701 - 05/24 0700 In: -  Out: 650 [Urine:650] Intake/Output this shift: No intake/output data recorded. There were no vitals filed for this visit.  Examination: General exam: Alert awake oriented to  self, sleepy, elderly on room air. HEENT:Oral mucosa moist, Ear/Nose WNL grossly, dentition normal. Respiratory system: bilaterally diminished, without wheezing or crackles, no use of accessory muscle Cardiovascular system: S1 & S2 +, No JVD,. Gastrointestinal system: Abdomen soft, NT,ND, BS+ Nervous System:Alert, awake, oriented x1, moving extremities and grossly nonfocal Extremities: no edema, distal peripheral pulses palpable.  Skin: No rashes,no icterus. MSK: Normal muscle bulk,tone, power  Data Reviewed: I have personally reviewed following labs and imaging studies CBC: Recent Labs  Lab 08/30/20 2013 08/31/20 0426 09/01/20 0410  WBC 12.5* 11.8* 9.9  HGB 14.1 13.5 13.1  HCT 43.8 41.8 39.9  MCV 88.7 88.2 87.5  PLT 377 366 364   Basic Metabolic Panel: Recent Labs  Lab 08/30/20 2013 08/31/20 0426 09/01/20 0410  NA 138 138 137  K 3.4* 3.1* 3.8  CL 105 104 106  CO2 24 24 24   GLUCOSE 194* 163* 206*  BUN 11 9 10   CREATININE 0.88 0.81 0.72  CALCIUM 8.9 8.9 8.8*   GFR: Estimated Creatinine Clearance: 48.3 mL/min (by C-G formula based on SCr of 0.72 mg/dL). Liver Function Tests: No results for input(s): AST, ALT, ALKPHOS, BILITOT, PROT, ALBUMIN in the last 168 hours. No results for input(s): LIPASE, AMYLASE in the last 168 hours. No results for input(s): AMMONIA in the last 168 hours. Coagulation Profile: No results for input(s): INR, PROTIME in the last 168 hours. Cardiac Enzymes: No results for input(s): CKTOTAL, CKMB, CKMBINDEX, TROPONINI in the last 168 hours. BNP (last 3 results) No results for input(s): PROBNP in the last 8760 hours. HbA1C: No results for input(s): HGBA1C in the last 72 hours. CBG: Recent Labs  Lab 09/03/20 1152 09/03/20 1640 09/03/20 2037 09/04/20 0614 09/04/20 1150  GLUCAP 119* 151* 151* 91 123*   Lipid Profile: No results for input(s): CHOL, HDL, LDLCALC, TRIG, CHOLHDL, LDLDIRECT in the last 72 hours. Thyroid Function Tests: No  results for input(s): TSH, T4TOTAL, FREET4, T3FREE, THYROIDAB in the last 72 hours. Anemia Panel: No results for input(s): VITAMINB12, FOLATE, FERRITIN, TIBC, IRON, RETICCTPCT in the last 72 hours. Sepsis Labs: No results for input(s): PROCALCITON, LATICACIDVEN in the last 168 hours.  Recent Results (from the past 240 hour(s))  Resp Panel by RT-PCR (Flu A&B, Covid) Nasopharyngeal Swab     Status: None   Collection Time: 08/31/20  8:04 AM   Specimen: Nasopharyngeal Swab; Nasopharyngeal(NP) swabs in vial transport medium  Result Value Ref Range Status   SARS Coronavirus 2 by RT PCR NEGATIVE NEGATIVE Final    Comment: (NOTE) SARS-CoV-2 target nucleic acids are NOT DETECTED.  The SARS-CoV-2 RNA is generally detectable in upper respiratory specimens during the acute phase of infection. The lowest concentration of SARS-CoV-2 viral copies this assay can detect is 138 copies/mL. A negative result does not preclude SARS-Cov-2 infection and should not be used as the sole basis for treatment or other patient management decisions. A negative result may occur with  improper specimen collection/handling, submission of specimen other than nasopharyngeal swab, presence of viral mutation(s) within the areas targeted by this assay, and inadequate number of viral copies(<138 copies/mL). A negative result must be combined with clinical observations, patient history, and epidemiological information. The expected result is Negative.  Fact Sheet for Patients:  EntrepreneurPulse.com.au  Fact Sheet for Healthcare Providers:  IncredibleEmployment.be  This test is no t yet approved or cleared by the [place] FDA and  has been authorized for detection and/or diagnosis of SARS-CoV-2 by FDA under an Emergency Use Authorization (EUA). This EUA will remain  in effect (meaning this test can be used) for the duration of the COVID-19 declaration under Section 564(b)(1) of  the Act, 21 U.S.C.section 360bbb-3(b)(1), unless the authorization is terminated  or revoked sooner.       Influenza A by PCR NEGATIVE NEGATIVE Final   Influenza B by PCR NEGATIVE NEGATIVE Final    Comment: (NOTE) The Xpert Xpress SARS-CoV-2/FLU/RSV plus assay is intended as an aid in the diagnosis of influenza from Nasopharyngeal swab specimens and should not be used as a sole basis for treatment. Nasal washings and aspirates are unacceptable for Xpert Xpress SARS-CoV-2/FLU/RSV testing.  Fact Sheet for Patients: EntrepreneurPulse.com.au  Fact Sheet for Healthcare Providers: IncredibleEmployment.be  This test is not yet approved or cleared by the [place] FDA and has been authorized for detection and/or diagnosis of SARS-CoV-2 by FDA under an Emergency Use Authorization (EUA). This EUA will remain in  effect (meaning this test can be used) for the duration of the COVID-19 declaration under Section 564(b)(1) of the Act, 21 U.S.C. section 360bbb-3(b)(1), unless the authorization is terminated or revoked.  Performed at [facility], [place] [address], [place], [place] [address]      Radiology Studies: No results found.   LOS: 0 days   Antonieta Pert, MD [facility]  09/04/2020, 12:55 PM

## 2020-09-04 NOTE — Plan of Care (Signed)
  Problem: Education: Goal: Knowledge of General Education information will improve Description: Including pain rating scale, medication(s)/side effects and non-pharmacologic comfort measures 09/04/2020 0639 by Sherre Lain, RN Outcome: Progressing 09/04/2020 0634 by Sherre Lain, RN Outcome: Progressing   Problem: Health Behavior/Discharge Planning: Goal: Ability to manage health-related needs will improve 09/04/2020 0639 by Sherre Lain, RN Outcome: Progressing 09/04/2020 0634 by Sherre Lain, RN Outcome: Progressing   Problem: Clinical Measurements: Goal: Ability to maintain clinical measurements within normal limits will improve 09/04/2020 0639 by Sherre Lain, RN Outcome: Progressing 09/04/2020 0634 by Sherre Lain, RN Outcome: Progressing Goal: Will remain free from infection 09/04/2020 0639 by Sherre Lain, RN Outcome: Progressing 09/04/2020 0634 by Sherre Lain, RN Outcome: Progressing Goal: Diagnostic test results will improve 09/04/2020 0639 by Sherre Lain, RN Outcome: Progressing 09/04/2020 0634 by Sherre Lain, RN Outcome: Progressing Goal: Respiratory complications will improve 09/04/2020 0639 by Sherre Lain, RN Outcome: Progressing 09/04/2020 0634 by Sherre Lain, RN Outcome: Progressing Goal: Cardiovascular complication will be avoided 09/04/2020 0639 by Sherre Lain, RN Outcome: Progressing 09/04/2020 0634 by Sherre Lain, RN Outcome: Progressing   Problem: Activity: Goal: Risk for activity intolerance will decrease 09/04/2020 0639 by Sherre Lain, RN Outcome: Progressing 09/04/2020 0634 by Sherre Lain, RN Outcome: Progressing   Problem: Nutrition: Goal: Adequate nutrition will be maintained 09/04/2020 0639 by Sherre Lain, RN Outcome: Progressing 09/04/2020 0634 by Sherre Lain, RN Outcome: Progressing   Problem: Coping: Goal: Level of anxiety will  decrease 09/04/2020 0639 by Sherre Lain, RN Outcome: Progressing 09/04/2020 0634 by Sherre Lain, RN Outcome: Progressing   Problem: Elimination: Goal: Will not experience complications related to bowel motility 09/04/2020 0639 by Sherre Lain, RN Outcome: Progressing 09/04/2020 0634 by Sherre Lain, RN Outcome: Progressing Goal: Will not experience complications related to urinary retention 09/04/2020 0639 by Sherre Lain, RN Outcome: Progressing 09/04/2020 0634 by Sherre Lain, RN Outcome: Progressing   Problem: Pain Managment: Goal: General experience of comfort will improve 09/04/2020 0639 by Sherre Lain, RN Outcome: Progressing 09/04/2020 0634 by Sherre Lain, RN Outcome: Progressing   Problem: Safety: Goal: Ability to remain free from injury will improve 09/04/2020 0639 by Sherre Lain, RN Outcome: Progressing 09/04/2020 0634 by Sherre Lain, RN Outcome: Progressing   Problem: Skin Integrity: Goal: Risk for impaired skin integrity will decrease 09/04/2020 0639 by Sherre Lain, RN Outcome: Progressing 09/04/2020 0634 by Sherre Lain, RN Outcome: Progressing

## 2020-09-05 DIAGNOSIS — Z515 Encounter for palliative care: Secondary | ICD-10-CM | POA: Diagnosis not present

## 2020-09-05 DIAGNOSIS — K219 Gastro-esophageal reflux disease without esophagitis: Secondary | ICD-10-CM | POA: Diagnosis present

## 2020-09-05 DIAGNOSIS — E039 Hypothyroidism, unspecified: Secondary | ICD-10-CM | POA: Diagnosis present

## 2020-09-05 DIAGNOSIS — F0391 Unspecified dementia with behavioral disturbance: Secondary | ICD-10-CM | POA: Diagnosis present

## 2020-09-05 DIAGNOSIS — F05 Delirium due to known physiological condition: Secondary | ICD-10-CM | POA: Diagnosis present

## 2020-09-05 DIAGNOSIS — I10 Essential (primary) hypertension: Secondary | ICD-10-CM | POA: Diagnosis not present

## 2020-09-05 DIAGNOSIS — R413 Other amnesia: Secondary | ICD-10-CM | POA: Diagnosis not present

## 2020-09-05 DIAGNOSIS — I110 Hypertensive heart disease with heart failure: Secondary | ICD-10-CM | POA: Diagnosis present

## 2020-09-05 DIAGNOSIS — E2740 Unspecified adrenocortical insufficiency: Secondary | ICD-10-CM | POA: Diagnosis present

## 2020-09-05 DIAGNOSIS — Z66 Do not resuscitate: Secondary | ICD-10-CM | POA: Diagnosis present

## 2020-09-05 DIAGNOSIS — Z7984 Long term (current) use of oral hypoglycemic drugs: Secondary | ICD-10-CM | POA: Diagnosis not present

## 2020-09-05 DIAGNOSIS — R451 Restlessness and agitation: Secondary | ICD-10-CM

## 2020-09-05 DIAGNOSIS — Z79899 Other long term (current) drug therapy: Secondary | ICD-10-CM | POA: Diagnosis not present

## 2020-09-05 DIAGNOSIS — R531 Weakness: Secondary | ICD-10-CM | POA: Diagnosis not present

## 2020-09-05 DIAGNOSIS — W010XXA Fall on same level from slipping, tripping and stumbling without subsequent striking against object, initial encounter: Secondary | ICD-10-CM | POA: Diagnosis present

## 2020-09-05 DIAGNOSIS — Z9181 History of falling: Secondary | ICD-10-CM | POA: Diagnosis not present

## 2020-09-05 DIAGNOSIS — I5032 Chronic diastolic (congestive) heart failure: Secondary | ICD-10-CM | POA: Diagnosis present

## 2020-09-05 DIAGNOSIS — I2510 Atherosclerotic heart disease of native coronary artery without angina pectoris: Secondary | ICD-10-CM | POA: Diagnosis present

## 2020-09-05 DIAGNOSIS — D649 Anemia, unspecified: Secondary | ICD-10-CM | POA: Diagnosis present

## 2020-09-05 DIAGNOSIS — Z20822 Contact with and (suspected) exposure to covid-19: Secondary | ICD-10-CM | POA: Diagnosis present

## 2020-09-05 DIAGNOSIS — Z7902 Long term (current) use of antithrombotics/antiplatelets: Secondary | ICD-10-CM | POA: Diagnosis not present

## 2020-09-05 DIAGNOSIS — E785 Hyperlipidemia, unspecified: Secondary | ICD-10-CM | POA: Diagnosis present

## 2020-09-05 DIAGNOSIS — Z87891 Personal history of nicotine dependence: Secondary | ICD-10-CM | POA: Diagnosis not present

## 2020-09-05 DIAGNOSIS — R339 Retention of urine, unspecified: Secondary | ICD-10-CM | POA: Diagnosis present

## 2020-09-05 DIAGNOSIS — Y92009 Unspecified place in unspecified non-institutional (private) residence as the place of occurrence of the external cause: Secondary | ICD-10-CM | POA: Diagnosis not present

## 2020-09-05 DIAGNOSIS — S72115A Nondisplaced fracture of greater trochanter of left femur, initial encounter for closed fracture: Secondary | ICD-10-CM | POA: Diagnosis not present

## 2020-09-05 DIAGNOSIS — S72142A Displaced intertrochanteric fracture of left femur, initial encounter for closed fracture: Secondary | ICD-10-CM | POA: Diagnosis present

## 2020-09-05 DIAGNOSIS — E1159 Type 2 diabetes mellitus with other circulatory complications: Secondary | ICD-10-CM | POA: Diagnosis not present

## 2020-09-05 DIAGNOSIS — Z794 Long term (current) use of insulin: Secondary | ICD-10-CM | POA: Diagnosis not present

## 2020-09-05 DIAGNOSIS — E876 Hypokalemia: Secondary | ICD-10-CM | POA: Diagnosis present

## 2020-09-05 DIAGNOSIS — S72111A Displaced fracture of greater trochanter of right femur, initial encounter for closed fracture: Secondary | ICD-10-CM | POA: Diagnosis not present

## 2020-09-05 DIAGNOSIS — I252 Old myocardial infarction: Secondary | ICD-10-CM | POA: Diagnosis not present

## 2020-09-05 DIAGNOSIS — R627 Adult failure to thrive: Secondary | ICD-10-CM | POA: Diagnosis not present

## 2020-09-05 DIAGNOSIS — Z955 Presence of coronary angioplasty implant and graft: Secondary | ICD-10-CM | POA: Diagnosis not present

## 2020-09-05 LAB — GLUCOSE, CAPILLARY
Glucose-Capillary: 128 mg/dL — ABNORMAL HIGH (ref 70–99)
Glucose-Capillary: 121 mg/dL — ABNORMAL HIGH (ref 70–99)
Glucose-Capillary: 137 mg/dL — ABNORMAL HIGH (ref 70–99)
Glucose-Capillary: 223 mg/dL — ABNORMAL HIGH (ref 70–99)

## 2020-09-05 NOTE — Plan of Care (Signed)

## 2020-09-05 NOTE — Progress Notes (Addendum)
PROGRESS NOTE    Alyssa [name]  IRS:[id number] DOB: 03/08/1936 DOA: 08/30/2020 PCP: Ernestene Kiel, MD   Chief Complaint  Patient presents with  . Fall  Brief Narrative: 85 year old female with multiple medical comorbidities including advanced dementia, CAD/HTN/HLD, diabetes mellitus, chronic diastolic CHF, hypothyroidism, GERD, Jehovah's Witness, anemia brought to the ED after a fall.  Patient is very difficult to care, was trying to ambulate with her walker slipped and fell onto her bottom. She was seen in the ED work-up showed nondisplaced fracture of the greater trochanter of the left proximal femur.  Orthopedic was consulted advised nonsurgical intervention PT OT pain control skilled nursing facility.  Patient was admitted.  Patient has been agitated in the ED. Patient admitted and followed by PT OT recommended skilled nursing facility and daughter is open to this at this time. On the last discharge 5/17 after compression fracture family declined SNF and was taken home with 24/7 caregiver. Patient was managed, she has had episodes of agitation suspecting in the setting of dementia.  Seen by psychiatry palliative care.  She was also started on Seroquel bedtime and seems to be responding well. Patient had issue with urine retention Foley was placed and removed and doing voiding trial-but again needing multiple in and out catheterization   Subjective: Patient needed in and out catheterization.  She was resting sleeping comfortably when I saw her this morning.  Unable to tell me where she is  Assessment & Plan:  Greater intertrochanteric fracture of the left femur mildly displaced: Per orthopedic continue pain control PT OT and nonsurgical management.Awaiting placement  Agitation in the setting of dementia with behavioral disturbance Advanced dementia with agitation behavioral disturbance: Appreciate psychiatric input.Patient having delirium due to dementia-started on Seroquel 25 mg  bedtime- psych input appreciated.can increase to twice daily if needed, stopped prn  Zyprexa.Continue supportive measures fall precaution.She seems to be responding well with Seroquel.  Patient family looking into taking her home so we are adjusting medication.  Continue on Aricept, Depakote ( level at 26)  Urine retention needing Foley catheter 5/21. started on Flomax.  off Foley catheter but needing in and out cath,  Again today.we will put a Foley catheter.  Hypokalemia resolved.  Hypothyroidism: Continue home Synthroid.  Recent TSH elevated however was nonadherent to Synthroid at that time follow-up with 4 to 6 weeks lab.  DM type 2, recent hemoglobin A1c 9.3, blood sugar stable after decreasing insulin continue 10 units Levemir and sliding scale insulin.  Continue to hold metformin/Amaryl while inpatient. Recent Labs  Lab 09/04/20 0614 09/04/20 1150 09/04/20 1624 09/04/20 2138 09/05/20 0721  GLUCAP 91 123* 188* 148* 128*   Chronic diastolic CHF: Compensated.  CAD/HLD/hypertension: BP is soft this morning stop losartan holding Coreg.Continue her Plavix, Zetia  Lipitor  Adrenal insufficiency: on po hydrocortisone  Goals of care: Palliative care input appreciated after family meeting discussion DNR MOST form completed.  After further discussion and family meeting plan is for discharge home with hospice care as per [facility]   Diet Order            Diet heart healthy/carb modified Room service appropriate? Yes; Fluid consistency: Thin  Diet effective now                Patient's There is no height or weight on file to calculate BMI.  DVT prophylaxis: enoxaparin (LOVENOX) injection 40 mg Start: 08/31/20 0800 Code Status:   Code Status: DNR  Family Communication: plan of care discussed with patient's  daughter on the phone 5/20 Discussed with the nursing staff.  Status [id number] status. Remains hospitalized for ongoing management, DTC delirium needing medication adjustment and  urine retention needing Foley catheterization  Dispo: The patient is from: Home              Anticipated d/c is to: Home with hospice services once arranged.              Patient currently is not medically stable.   Difficult to place patient No Unresulted Labs (From admission, onward)         None     Medications reviewed:  Scheduled Meds: . atorvastatin  80 mg Oral Daily  . carvedilol  3.125 mg Oral BID  . clopidogrel  75 mg Oral Daily  . divalproex  250 mg Oral QPM  . donepezil  10 mg Oral QPM  . enoxaparin (LOVENOX) injection  40 mg Subcutaneous Q24H  . escitalopram  20 mg Oral QPM  . ezetimibe  10 mg Oral QPM  . hydrocortisone  15 mg Oral QPM  . insulin aspart  0-6 Units Subcutaneous TID WC  . insulin detemir  10 Units Subcutaneous QPM  . levothyroxine  125 mcg Oral QAC breakfast  . LORazepam  1 mg Intravenous Once  . QUEtiapine  25 mg Oral QHS  . tamsulosin  0.4 mg Oral Daily   Continuous Infusions:  Consultants:see note  Procedures:see note  Antimicrobials: Anti-infectives (From admission, onward)   None     Culture/Microbiology    Component Value Date/Time   SDES URINE, RANDOM 08/23/2020 0700   SPECREQUEST NONE 08/23/2020 0700   CULT  08/23/2020 0700    NO GROWTH Performed at [facility] [address], [place], [place] [address]    REPTSTATUS 08/24/2020 FINAL 08/23/2020 0700    Other culture-see note  Objective: Vitals: Today's Vitals   09/04/20 1519 09/04/20 2024 09/05/20 0426 09/05/20 0750  BP: (!) 103/42 (!) 117/51 (!) 115/50 (!) 94/47  Pulse: 72 80 75 70  Resp: 15 19 19 16   Temp: (!) 97.4 F (36.3 C) 98.1 F (36.7 C)  (!) 97.4 F (36.3 C)  TempSrc: Oral   Oral  SpO2: 100% 100%  97%  PainSc:        Intake/Output Summary (Last 24 hours) at 09/05/2020 1047 Last data filed at 09/05/2020 0900 Gross per 24 hour  Intake 180 ml  Output 500 ml  Net -320 ml   There were no vitals filed for this visit. Weight change:   Intake/Output  from previous day: 05/24 0701 - 05/25 0700 In: 180 [P.O.:180] Out: 500 [Urine:500] Intake/Output this shift: No intake/output data recorded. There were no vitals filed for this visit.  Examination: General exam: AAOx1, calm,older than stated age, weak appearing. HEENT:Oral mucosa moist, Ear/Nose WNL grossly, dentition normal. Respiratory system: bilaterally clear without crackles and wheezing, no use of accessory muscle Cardiovascular system: S1 & S2 +, No JVD,. Gastrointestinal system: Abdomen soft, NT,ND, BS+ Nervous System:Alert, awake, with baseline dementia, comfortable, moving extremities and grossly nonfocal Extremities: no edema, distal peripheral pulses palpable.  Skin: No rashes,no icterus. MSK: Normal muscle bulk,tone, power  Data Reviewed: I have personally reviewed following labs and imaging studies CBC: Recent Labs  Lab 08/30/20 2013 08/31/20 0426 09/01/20 0410  WBC 12.5* 11.8* 9.9  HGB 14.1 13.5 13.1  HCT 43.8 41.8 39.9  MCV 88.7 88.2 87.5  PLT 377 366 681   Basic Metabolic Panel: Recent Labs  Lab 08/30/20 2013  08/31/20 0426 09/01/20 0410  NA 138 138 137  K 3.4* 3.1* 3.8  CL 105 104 106  CO2 24 24 24   GLUCOSE 194* 163* 206*  BUN 11 9 10   CREATININE 0.88 0.81 0.72  CALCIUM 8.9 8.9 8.8*   GFR: Estimated Creatinine Clearance: 48.3 mL/min (by C-G formula based on SCr of 0.72 mg/dL). Liver Function Tests: No results for input(s): AST, ALT, ALKPHOS, BILITOT, PROT, ALBUMIN in the last 168 hours. No results for input(s): LIPASE, AMYLASE in the last 168 hours. No results for input(s): AMMONIA in the last 168 hours. Coagulation Profile: No results for input(s): INR, PROTIME in the last 168 hours. Cardiac Enzymes: No results for input(s): CKTOTAL, CKMB, CKMBINDEX, TROPONINI in the last 168 hours. BNP (last 3 results) No results for input(s): PROBNP in the last 8760 hours. HbA1C: No results for input(s): HGBA1C in the last 72 hours. CBG: Recent Labs   Lab 09/04/20 0614 09/04/20 1150 09/04/20 1624 09/04/20 2138 09/05/20 0721  GLUCAP 91 123* 188* 148* 128*   Lipid Profile: No results for input(s): CHOL, HDL, LDLCALC, TRIG, CHOLHDL, LDLDIRECT in the last 72 hours. Thyroid Function Tests: No results for input(s): TSH, T4TOTAL, FREET4, T3FREE, THYROIDAB in the last 72 hours. Anemia Panel: No results for input(s): VITAMINB12, FOLATE, FERRITIN, TIBC, IRON, RETICCTPCT in the last 72 hours. Sepsis Labs: No results for input(s): PROCALCITON, LATICACIDVEN in the last 168 hours.  Recent Results (from the past 240 hour(s))  Resp Panel by RT-PCR (Flu A&B, Covid) Nasopharyngeal Swab     Status: None   Collection Time: 08/31/20  8:04 AM   Specimen: Nasopharyngeal Swab; Nasopharyngeal(NP) swabs in vial transport medium  Result Value Ref Range Status   SARS Coronavirus 2 by RT PCR NEGATIVE NEGATIVE Final    Comment: (NOTE) SARS-CoV-2 target nucleic acids are NOT DETECTED.  The SARS-CoV-2 RNA is generally detectable in upper respiratory specimens during the acute phase of infection. The lowest concentration of SARS-CoV-2 viral copies this assay can detect is 138 copies/mL. A negative result does not preclude SARS-Cov-2 infection and should not be used as the sole basis for treatment or other patient management decisions. A negative result may occur with  improper specimen collection/handling, submission of specimen other than nasopharyngeal swab, presence of viral mutation(s) within the areas targeted by this assay, and inadequate number of viral copies(<138 copies/mL). A negative result must be combined with clinical observations, patient history, and epidemiological information. The expected result is Negative.  Fact Sheet for Patients:  EntrepreneurPulse.com.au  Fact Sheet for Healthcare Providers:  IncredibleEmployment.be  This test is no t yet approved or cleared by the [place] FDA and   has been authorized for detection and/or diagnosis of SARS-CoV-2 by FDA under an Emergency Use Authorization (EUA). This EUA will remain  in effect (meaning this test can be used) for the duration of the COVID-19 declaration under Section 564(b)(1) of the Act, 21 U.S.C.section 360bbb-3(b)(1), unless the authorization is terminated  or revoked sooner.       Influenza A by PCR NEGATIVE NEGATIVE Final   Influenza B by PCR NEGATIVE NEGATIVE Final    Comment: (NOTE) The Xpert Xpress SARS-CoV-2/FLU/RSV plus assay is intended as an aid in the diagnosis of influenza from Nasopharyngeal swab specimens and should not be used as a sole basis for treatment. Nasal washings and aspirates are unacceptable for Xpert Xpress SARS-CoV-2/FLU/RSV testing.  Fact Sheet for Patients: EntrepreneurPulse.com.au  Fact Sheet for Healthcare Providers: IncredibleEmployment.be  This test is not yet approved  or cleared by the [place] and has been authorized for detection and/or diagnosis of SARS-CoV-2 by FDA under an Emergency Use Authorization (EUA). This EUA will remain in effect (meaning this test can be used) for the duration of the COVID-19 declaration under Section 564(b)(1) of the Act, 21 U.S.C. section 360bbb-3(b)(1), unless the authorization is terminated or revoked.  Performed at [facility], [place] [address], [place], [place] [address]      Radiology Studies: No results found.   LOS: 0 days   Antonieta Pert, MD [facility]  09/05/2020, 10:47 AM

## 2020-09-05 NOTE — Progress Notes (Addendum)
PT Cancellation Note  Patient Details Name: Alyssa [name] MRN: [id number] DOB: 1935-12-29   Cancelled Treatment:    Reason Eval/Treat Not Completed: (P) Medical issues which prohibited therapy (Pt now for d/c home with hospice, will d/c from caseload at this time and inform supervising PT to d/c order.)  Spoke with palliative med team who reports PT is no longer appropriate.   Cristela Blue 09/05/2020, 1:17 PM  Physical Therapy Discharge     Patient discharged from PT services secondary to medical decline - will need to re-order PT to resume therapy services.  Pt now for d/c home with hospice.    Please see latest therapy progress note for current level of functioning and progress toward goals.    Progress and discharge plan discussed with patient and/or caregiver: Patient/Caregiver agrees with plan  GP     Iann Rodier Eli Hose 09/05/2020, 1:17 PM   Erasmo Leventhal , PTA Acute Rehabilitation Services Pager [phone] Office [phone]

## 2020-09-05 NOTE — Progress Notes (Signed)
Daily Progress Note   Patient Name: Alyssa [name]       Date: 09/05/2020 DOB: 03/04/1936  Age: 85 y.o. MRN#: [id number] Attending Physician: Antonieta Pert, MD Primary Care Physician: Ernestene Kiel, MD Admit Date: 08/30/2020  Reason for Consultation/Follow-up: Disposition, Establishing goals of care and Psychosocial/spiritual support  Subjective: Today the patient states she feels good.  She ate a few bites but does not know why she did not eat more.  She denies any pain.  She denies any concerns or worries.  No other complaints today.  Length of Stay: 0  Current Medications: Scheduled Meds:  . atorvastatin  80 mg Oral Daily  . carvedilol  3.125 mg Oral BID  . clopidogrel  75 mg Oral Daily  . divalproex  250 mg Oral QPM  . donepezil  10 mg Oral QPM  . enoxaparin (LOVENOX) injection  40 mg Subcutaneous Q24H  . escitalopram  20 mg Oral QPM  . ezetimibe  10 mg Oral QPM  . hydrocortisone  15 mg Oral QPM  . insulin aspart  0-6 Units Subcutaneous TID WC  . insulin detemir  10 Units Subcutaneous QPM  . levothyroxine  125 mcg Oral QAC breakfast  . LORazepam  1 mg Intravenous Once  . QUEtiapine  25 mg Oral QHS  . tamsulosin  0.4 mg Oral Daily    Continuous Infusions:   PRN Meds: acetaminophen **OR** acetaminophen, oxyCODONE  Physical Exam Constitutional:      General: She is not in acute distress.    Appearance: Normal appearance. She is not toxic-appearing.  Neurological:     Mental Status: She is alert. She is disoriented.     Comments: Oriented x 1 (person)  Psychiatric:        Mood and Affect: Mood normal.        Behavior: Behavior normal.             Vital Signs: BP (!) 94/47 (BP Location: Left Arm)   Pulse 70   Temp (!) 97.4 F (36.3 C) (Oral)   Resp 16   SpO2  97%  SpO2: SpO2: 97 % O2 Device: O2 Device: Room Air O2 Flow Rate:    Intake/output summary:   Intake/Output Summary (Last 24 hours) at 09/05/2020 0931 Last data filed at 09/05/2020 0400 Gross per 24 hour  Intake 180 ml  Output 500 ml  Net -320 ml   LBM: Last BM Date: 09/04/20 Baseline Weight:   Most recent weight:         Palliative Assessment/Data: 30 %      Patient Active Problem List   Diagnosis Date Noted  . Closed displaced fracture of greater trochanter of right femur ([place])   . Femur fracture, left ([place]) 08/30/2020  . Lumbar compression fracture ([place]) 08/22/2020  . Adrenal insufficiency ([place]) 03/09/2019  . Hypothermia   . Acute metabolic encephalopathy due to hypoglycemia   . Hypoglycemia 03/07/2019  . Arthralgia of left temporomandibular joint 02/09/2018  . Referred otalgia of left ear 02/09/2018  . Memory disorder 10/06/2017  . Insulin dependent diabetes mellitus 11/19/2016  . Chest pain 11/18/2016  . Diabetes mellitus without complication ([place]) 36/64/4034  . Dysthymia 06/20/2014  . Hypertensive heart disease 04/11/2014  . Anemia   . History of GI bleed   . CAD (coronary artery disease)   . HLD (hyperlipidemia)   . Peripheral vascular disease ([place])   . Chronic diastolic CHF (congestive heart failure) ([place])   . Essential hypertension   . NSTEMI (non-ST elevated myocardial infarction) ([place]) 04/01/2014  . Refusal of blood transfusions as patient is Jehovah's Witness 09/29/2012  . Blood loss anemia 02/26/2012  . Melena 02/25/2012  . GERD (gastroesophageal reflux disease) 11/11/2011  . DM type 2 (diabetes mellitus, type 2) ([place]) 05/26/2011  . Hypothyroid     Palliative Care Assessment & Plan   Patient Profile: 85 y.o. female  with past medical history of advanced dementia, CAD/HTN/HLD, diabetes mellitus, chronic diastolic CHF, hypothyroidism, GERD, anemia admitted on 08/30/2020 after a fall.  ED work-up showed nondisplaced fracture of the left femur,  orthopedic recommended nonsurgical intervention.  Recent significant physical debility and progressive dementia after fall 1 month ago.  Assessment: The patient appears comfortable today, no agitation evident.    This NP had a lengthy discussion with the patient's healthcare power of attorney/daughter Neoma Laming.  She states that her baseline is physically able with ADLs, some recent unsteadiness.  After her first fall a month ago she had significant limitations of her mobility and difficulty even getting out of bed.    Mentally her dementia has been getting worse progressively and took a significant decline after her first fall.    They were initially offered SNF/rehab but it was felt rehab would not have much to offer and they did not want her just in a nursing home on medications.  They felt he could care for her at home with appropriate medications and support.    The family accepts that her mother "likely will not meaningfully recover" and they "just want her at home, happy, comfortable."    We reviewed the MOST form which is been completed and will be signed later today when the daughter visits bedside.  Recommendations/Plan:  Plan for discharge to home with home hospice with [facility] as the preferred agency, they need until Friday, 09/07/2020 to work out details and set up.  MOST form completed with details available in advance care planning and below.  Goals of Care and Additional Recommendations:  Limitations on Scope of Treatment: No Artificial Feeding and determine antibiotic use if/when infection develops, IV fluids for defined trial  Code Status:    Code Status Orders  (From admission, onward)         Start     Ordered   09/04/20 1336  Do not attempt resuscitation (DNR)  Continuous       Question Answer Comment  In the event of cardiac or respiratory ARREST Do not call a "code blue"   In the event of cardiac or respiratory ARREST Do not perform Intubation, CPR,  defibrillation or ACLS   In the event of cardiac or respiratory ARREST Use medication by any route, position, wound care, and other measures to relive pain and suffering. May use oxygen, suction and manual treatment of airway obstruction as needed for comfort.      09/04/20 1335        Code Status History    Date Active Date Inactive Code Status Order ID Comments User Context   08/30/2020 2245 09/04/2020 1335 Full Code [id number]  Nicolette Bang, DO ED   08/22/2020 1955 08/28/2020 2348 Full Code [id number]  Shela Leff, MD ED   03/07/2019 1441 03/09/2019 2021 Full Code [id number]  Ina Homes, MD ED   04/03/2014 1514 04/04/2014 1511 Full Code [id number]  Jettie Booze, MD Inpatient   04/01/2014 2255 04/03/2014 1514 Full Code [id number]  Sueanne Margarita, MD Inpatient   02/25/2012 1740 02/28/2012 1749 Full Code [id number]  Jolyne Loa, RN Inpatient   05/26/2011 1943 05/27/2011 1313 Full Code [id number]  Elvera Lennox, MD Inpatient   Advance Care Planning Activity       Prognosis:   < 6 months  Discharge Planning:  Home with Hospice  Care plan was discussed with Dr Lupita Leash  Total time spent was 45 minutes  Greater than 50 % time spent in counseling and coordination of care  Thank you for allowing the Palliative Medicine Team to assist in the care of this patient.      Greater than 50%  of this time was spent counseling and coordinating care related to the above assessment and plan.  Above assessment and visit completed/observation with  Walden Field NP  Wadie Lessen, NP  Please contact Palliative Medicine Team phone at [phone] for questions and concerns.

## 2020-09-05 NOTE — TOC Progression Note (Signed)
Transition of Care [facility]) - Progression Note    Patient Details  Name: GERALD HONEA MRN: [id number] Date of Birth: May 14, 1935  Transition of [facility]) CM/SW Contact  Sharin Mons, RN Phone Number: 09/05/2020, 9:57 AM  Clinical Narrative:    Consult received : Set up home hospice, family is requesting [facility]. Need to discharge home on Friday.   NCM called daughter [name] and [name] confirmed d/c plan, home with hospice care. Preference, [facility]. Referral made with [facility], approval pending  [name] (Daughter)     [phone]      [facility] team will continue to monitor and assist with needs....  Expected Discharge Plan: Home w Hospice Care Barriers to Discharge: Continued Medical Work up  Expected Discharge Plan and Services Expected Discharge Plan: [place] Determinants of Health (SDOH) Interventions    Readmission Risk Interventions Readmission Risk Prevention Plan 03/08/2019  Post Dischage Appt Not Complete  Appt Comments await discharge timing  Medication Screening Complete  Transportation Screening Complete  Some recent data might be hidden

## 2020-09-06 DIAGNOSIS — R451 Restlessness and agitation: Secondary | ICD-10-CM | POA: Diagnosis not present

## 2020-09-06 LAB — GLUCOSE, CAPILLARY
Glucose-Capillary: 178 mg/dL — ABNORMAL HIGH (ref 70–99)
Glucose-Capillary: 198 mg/dL — ABNORMAL HIGH (ref 70–99)
Glucose-Capillary: 182 mg/dL — ABNORMAL HIGH (ref 70–99)
Glucose-Capillary: 298 mg/dL — ABNORMAL HIGH (ref 70–99)

## 2020-09-06 MED ORDER — CHLORHEXIDINE GLUCONATE CLOTH 2 % EX PADS
6.0000 | MEDICATED_PAD | Freq: Every day | CUTANEOUS | Status: DC
  Administered 2020-09-06 – 2020-09-07 (×2): 6 via TOPICAL

## 2020-09-06 NOTE — Plan of Care (Signed)
  Problem: Clinical Measurements: Goal: Ability to maintain clinical measurements within normal limits will improve Outcome: Progressing Goal: Diagnostic test results will improve Outcome: Progressing   Problem: Activity: Goal: Risk for activity intolerance will decrease Outcome: Progressing   Problem: Nutrition: Goal: Adequate nutrition will be maintained Outcome: Progressing   Problem: Coping: Goal: Level of anxiety will decrease Outcome: Progressing   Problem: Elimination: Goal: Will not experience complications related to bowel motility Outcome: Progressing   Problem: Pain Managment: Goal: General experience of comfort will improve Outcome: Progressing   Problem: Safety: Goal: Ability to remain free from injury will improve Outcome: Progressing   Problem: Skin Integrity: Goal: Risk for impaired skin integrity will decrease Outcome: Progressing

## 2020-09-06 NOTE — Progress Notes (Signed)
PROGRESS NOTE    Alyssa [name]  XQJ:[id number] DOB: 1935-08-23 DOA: 08/30/2020 PCP: Ernestene Kiel, MD   Chief Complaint  Patient presents with  . Fall  Brief Narrative: 85 year old female with multiple medical comorbidities including advanced dementia, CAD/HTN/HLD, diabetes mellitus, chronic diastolic CHF, hypothyroidism, GERD, Jehovah's Witness, anemia brought to the ED after a fall.  Patient is very difficult to care, was trying to ambulate with her walker slipped and fell onto her bottom. She was seen in the ED work-up showed nondisplaced fracture of the greater trochanter of the left proximal femur.  Orthopedic was consulted advised nonsurgical intervention PT OT pain control skilled nursing facility.  Patient was admitted.  Patient has been agitated in the ED. Patient admitted and followed by PT OT recommended skilled nursing facility and daughter is open to this at this time. On the last discharge 5/17 after compression fracture family declined SNF and was taken home with 24/7 caregiver. Patient was managed, she has had episodes of agitation suspecting in the setting of dementia.  Seen by psychiatry palliative care.  She was also started on Seroquel bedtime and seems to be responding well. Patient had issue with urine retention Foley was placed and removed and doing voiding trial-but again needing multiple in and out catheterization.  Needed Foley to be reinserted 5/25.   Subjective: Doing well she is alert awake oriented to self, to hospital Denies any complaints. Foley in place  Assessment & Plan:  Greater intertrochanteric fracture of the left femur mildly displaced: Per orthopedic continue pain control PT OT and nonsurgical management.Awaiting placement to home with hospice services  Agitation in the setting of dementia with behavioral disturbance Advanced dementia with agitation behavioral disturbance: Appreciate psychiatric input.Patient having delirium due to  dementia-started on Seroquel 25 mg bedtime-she seems to have responded very well.  She is in fact alert awake oriented to self, hospital.  Continue the same appreciate psychiatry input- Can increase Seroquel to twice daily if needed, stopped prn  Zyprexa.Continue supportive measures fall precaution.plan is for home with hospice.Continue on Aricept, Depakote ( level at 26)  Urine retention needing Foley catheter 5/21. started on Flomax.  off Foley catheter but needing in and out cath and Foley reinserted 5/25.  Keep the Foley for now.  Can follow-up with urology or primary as outpatient.  Hypokalemia resolved.  Hypothyroidism: Continue home Synthroid.  Recent TSH elevated however was nonadherent to Synthroid at that time follow-up with 4 to 6 weeks lab.  DM type 2, recent hemoglobin A1c 9.3, blood sugar stable after decreasing insulin continue 10 units Levemir and sliding scale insulin.  Continue to hold metformin/Amaryl while inpatient. Recent Labs  Lab 09/05/20 0721 09/05/20 1128 09/05/20 1621 09/05/20 2003 09/06/20 0622  GLUCAP 128* 121* 137* 223* 178*   Chronic diastolic CHF: Compensated.  CAD/HLD/hypertension: BP is soft 5/25 morning stopped losartan, continue low-dose Coreg.Continue her Plavix, Zetia  Lipitor  Adrenal insufficiency: on po hydrocortisone  Goals of care: Palliative care input appreciated after family meeting discussion DNR MOST form completed.  After further discussion and family meeting plan is for discharge home with hospice care as per [facility].  Waiting for hospice to be set up on Friday.   Diet Order            Diet heart healthy/carb modified Room service appropriate? Yes; Fluid consistency: Thin  Diet effective now                Patient's There is no height or weight on  file to calculate BMI.  DVT prophylaxis: enoxaparin (LOVENOX) injection 40 mg Start: 08/31/20 0800 Code Status:   Code Status: DNR  Family Communication: plan of care discussed with  patient's daughter on the phone 5/20 Discussed with the nursing staff.  Status LP:[id number] status. Remains hospitalized for ongoing management, DTC delirium needing medication adjustment and urine retention needing Foley catheterization  Dispo: The patient is from: Home              Anticipated d/c is to: Home with hospice services once arranged.              Patient currently is medically stable.   Difficult to place patient No Unresulted Labs (From admission, onward)         None     Medications reviewed:  Scheduled Meds: . atorvastatin  80 mg Oral Daily  . carvedilol  3.125 mg Oral BID  . Chlorhexidine Gluconate Cloth  6 each Topical Daily  . clopidogrel  75 mg Oral Daily  . divalproex  250 mg Oral QPM  . donepezil  10 mg Oral QPM  . enoxaparin (LOVENOX) injection  40 mg Subcutaneous Q24H  . escitalopram  20 mg Oral QPM  . ezetimibe  10 mg Oral QPM  . hydrocortisone  15 mg Oral QPM  . insulin aspart  0-6 Units Subcutaneous TID WC  . insulin detemir  10 Units Subcutaneous QPM  . levothyroxine  125 mcg Oral QAC breakfast  . LORazepam  1 mg Intravenous Once  . QUEtiapine  25 mg Oral QHS  . tamsulosin  0.4 mg Oral Daily   Continuous Infusions:  Consultants:see note  Procedures:see note  Antimicrobials: Anti-infectives (From admission, onward)   None     Culture/Microbiology    Component Value Date/Time   SDES URINE, RANDOM 08/23/2020 0700   SPECREQUEST NONE 08/23/2020 0700   CULT  08/23/2020 0700    NO GROWTH Performed at [facility] [address], [place], [place] [address]    REPTSTATUS 08/24/2020 FINAL 08/23/2020 0700    Other culture-see note  Objective: Vitals: Today's Vitals   09/05/20 2007 09/05/20 2115 09/05/20 2200 09/06/20 0818  BP: 134/63   (!) 145/83  Pulse: 77   86  Resp: 16   17  Temp: 98.2 F (36.8 C)   98.7 F (37.1 C)  TempSrc: Oral   Oral  SpO2: 96%   97%  PainSc:  5  Asleep     Intake/Output Summary (Last 24 hours)  at 09/06/2020 0944 Last data filed at 09/06/2020 0818 Gross per 24 hour  Intake --  Output 750 ml  Net -750 ml   There were no vitals filed for this visit. Weight change:   Intake/Output from previous day: No intake/output data recorded. Intake/Output this shift: Total I/O In: -  Out: 750 [Urine:750] There were no vitals filed for this visit.  Examination: General exam: AAOx2, old frail HEENT:Oral mucosa moist, Ear/Nose WNL grossly, dentition normal. Respiratory system: bilaterally diminished,  no use of accessory muscle Cardiovascular system: S1 & S2 +, No JVD,. Gastrointestinal system: Abdomen soft,NT,ND, BS+ Nervous System:Alert, awake, moving her extremities.  Does not know what is going on.  xtremities: no edema, distal peripheral pulses palpable.  Skin: No rashes,no icterus. MSK: Normal muscle bulk,tone, power  Data Reviewed: I have personally reviewed following labs and imaging studies CBC: Recent Labs  Lab 08/30/20 2013 08/31/20 0426 09/01/20 0410  WBC 12.5* 11.8* 9.9  HGB 14.1 13.5 13.1  HCT  43.8 41.8 39.9  MCV 88.7 88.2 87.5  PLT 377 366 846   Basic Metabolic Panel: Recent Labs  Lab 08/30/20 2013 08/31/20 0426 09/01/20 0410  NA 138 138 137  K 3.4* 3.1* 3.8  CL 105 104 106  CO2 24 24 24   GLUCOSE 194* 163* 206*  BUN 11 9 10   CREATININE 0.88 0.81 0.72  CALCIUM 8.9 8.9 8.8*   GFR: Estimated Creatinine Clearance: 48.3 mL/min (by C-G formula based on SCr of 0.72 mg/dL). Liver Function Tests: No results for input(s): AST, ALT, ALKPHOS, BILITOT, PROT, ALBUMIN in the last 168 hours. No results for input(s): LIPASE, AMYLASE in the last 168 hours. No results for input(s): AMMONIA in the last 168 hours. Coagulation Profile: No results for input(s): INR, PROTIME in the last 168 hours. Cardiac Enzymes: No results for input(s): CKTOTAL, CKMB, CKMBINDEX, TROPONINI in the last 168 hours. BNP (last 3 results) No results for input(s): PROBNP in the last 8760  hours. HbA1C: No results for input(s): HGBA1C in the last 72 hours. CBG: Recent Labs  Lab 09/05/20 0721 09/05/20 1128 09/05/20 1621 09/05/20 2003 09/06/20 0622  GLUCAP 128* 121* 137* 223* 178*   Lipid Profile: No results for input(s): CHOL, HDL, LDLCALC, TRIG, CHOLHDL, LDLDIRECT in the last 72 hours. Thyroid Function Tests: No results for input(s): TSH, T4TOTAL, FREET4, T3FREE, THYROIDAB in the last 72 hours. Anemia Panel: No results for input(s): VITAMINB12, FOLATE, FERRITIN, TIBC, IRON, RETICCTPCT in the last 72 hours. Sepsis Labs: No results for input(s): PROCALCITON, LATICACIDVEN in the last 168 hours.  Recent Results (from the past 240 hour(s))  Resp Panel by RT-PCR (Flu A&B, Covid) Nasopharyngeal Swab     Status: None   Collection Time: 08/31/20  8:04 AM   Specimen: Nasopharyngeal Swab; Nasopharyngeal(NP) swabs in vial transport medium  Result Value Ref Range Status   SARS Coronavirus 2 by RT PCR NEGATIVE NEGATIVE Final    Comment: (NOTE) SARS-CoV-2 target nucleic acids are NOT DETECTED.  The SARS-CoV-2 RNA is generally detectable in upper respiratory specimens during the acute phase of infection. The lowest concentration of SARS-CoV-2 viral copies this assay can detect is 138 copies/mL. A negative result does not preclude SARS-Cov-2 infection and should not be used as the sole basis for treatment or other patient management decisions. A negative result may occur with  improper specimen collection/handling, submission of specimen other than nasopharyngeal swab, presence of viral mutation(s) within the areas targeted by this assay, and inadequate number of viral copies(<138 copies/mL). A negative result must be combined with clinical observations, patient history, and epidemiological information. The expected result is Negative.  Fact Sheet for Patients:  EntrepreneurPulse.com.au  Fact Sheet for Healthcare Providers:   IncredibleEmployment.be  This test is no t yet approved or cleared by the [place] FDA and  has been authorized for detection and/or diagnosis of SARS-CoV-2 by FDA under an Emergency Use Authorization (EUA). This EUA will remain  in effect (meaning this test can be used) for the duration of the COVID-19 declaration under Section 564(b)(1) of the Act, 21 U.S.C.section 360bbb-3(b)(1), unless the authorization is terminated  or revoked sooner.       Influenza A by PCR NEGATIVE NEGATIVE Final   Influenza B by PCR NEGATIVE NEGATIVE Final    Comment: (NOTE) The Xpert Xpress SARS-CoV-2/FLU/RSV plus assay is intended as an aid in the diagnosis of influenza from Nasopharyngeal swab specimens and should not be used as a sole basis for treatment. Nasal washings and aspirates are unacceptable for  Xpert Xpress SARS-CoV-2/FLU/RSV testing.  Fact Sheet for Patients: EntrepreneurPulse.com.au  Fact Sheet for Healthcare Providers: IncredibleEmployment.be  This test is not yet approved or cleared by the [place] FDA and has been authorized for detection and/or diagnosis of SARS-CoV-2 by FDA under an Emergency Use Authorization (EUA). This EUA will remain in effect (meaning this test can be used) for the duration of the COVID-19 declaration under Section 564(b)(1) of the Act, 21 U.S.C. section 360bbb-3(b)(1), unless the authorization is terminated or revoked.  Performed at [facility], [place] [address], [place], [place] [address]      Radiology Studies: No results found.   LOS: 1 day   Antonieta Pert, MD [facility]  09/06/2020, 9:44 AM

## 2020-09-06 NOTE — Progress Notes (Signed)
    Progress Note from the Palliative Medicine Team at [facility]   Patient Name: Alyssa [name]        Date: 09/06/2020 DOB: 09-12-1935  Age: 85 y.o. MRN#: [id number] Attending Physician: Antonieta Pert, MD Primary Care Physician: Ernestene Kiel, MD Admit Date: 08/30/2020   Medical records reviewed.  This NP visited patient at the bedside as a follow up to  yesterday's [place]. The patient was sleeping and appears comfortable. Her meal tray was 50% consumed. Did not wake the patient. Plan still remains hopefull discharge tomorrow to home with hospice.  No charge visit.  Wadie Lessen NP  Palliative Medicine Team Team Phone # [phone] Pager [phone]

## 2020-09-07 DIAGNOSIS — R451 Restlessness and agitation: Secondary | ICD-10-CM | POA: Diagnosis not present

## 2020-09-07 LAB — GLUCOSE, CAPILLARY
Glucose-Capillary: 167 mg/dL — ABNORMAL HIGH (ref 70–99)
Glucose-Capillary: 110 mg/dL — ABNORMAL HIGH (ref 70–99)

## 2020-09-07 MED ORDER — LEVEMIR FLEXTOUCH 100 UNIT/ML ~~LOC~~ SOPN
10.0000 [IU] | PEN_INJECTOR | Freq: Every evening | SUBCUTANEOUS | 11 refills | Status: AC
Start: 2020-09-07 — End: ?

## 2020-09-07 MED ORDER — OXYCODONE HCL 5 MG PO TABS: 5.0000 mg | ORAL_TABLET | Freq: Four times a day (QID) | ORAL | 0 refills | Status: AC | PRN

## 2020-09-07 MED ORDER — TAMSULOSIN HCL 0.4 MG PO CAPS: 0.4000 mg | ORAL_CAPSULE | Freq: Every day | ORAL | 1 refills | Status: AC

## 2020-09-07 MED ORDER — QUETIAPINE FUMARATE 25 MG PO TABS: 25.0000 mg | ORAL_TABLET | Freq: Every day | ORAL | 1 refills | Status: AC

## 2020-09-07 NOTE — Plan of Care (Signed)

## 2020-09-07 NOTE — Progress Notes (Signed)
Pt's daughter called to update on pending discharge, all questions and concerns addressed, Pt not in distress, discharged home with belongings via [place].

## 2020-09-07 NOTE — TOC Transition Note (Addendum)
Transition of Care [facility]) - CM/SW Discharge Note   Patient Details  Name: Alyssa [name] MRN: [id number] Date of Birth: January 09, 1936  [facility] CM/SW Contact:  Coralee Pesa, [place] Phone Number: 09/07/2020, 10:44 AM   Clinical Narrative:    Pt to be transported home via [facility] w/ hospice Care. Personal assistant.  Final next level of care: Home w Hospice Care Barriers to Discharge: Insurance Authorization   Patient Goals and CMS Choice        Discharge Placement              Patient chooses bed at:  (Pt returning home) Patient to be transferred to facility by: [place] Name of family member notified: Debra Patient and family notified of of transfer: 09/07/20  Discharge Plan and Services                                     Social Determinants of Health ([place]) Interventions     Readmission Risk Interventions Readmission Risk Prevention Plan 03/08/2019  Post Dischage Appt Not Complete  Appt Comments await discharge timing  Medication Screening Complete  Transportation Screening Complete  Some recent data might be hidden

## 2020-09-07 NOTE — Discharge Summary (Signed)
Physician Discharge Summary  Alyssa [name] CHE:[id number] DOB: April 04, 1936 DOA: 08/30/2020  PCP: Ernestene Kiel, MD  Admit date: 08/30/2020 Discharge date: 09/07/2020  Admitted From: home Disposition:  Home w/ hospice  Recommendations for Outpatient Follow-up:  1. Follow up hospice team on d/c.  Home Health:no  Equipment/Devices: yes  Discharge Condition: Stable Code Status:   Code Status: DNR Diet recommendation:  Diet Order            Diet heart healthy/carb modified Room service appropriate? Yes; Fluid consistency: Thin  Diet effective now                  Brief/Interim Summary: 85 year old female with multiple medical comorbidities including advanced dementia, CAD/HTN/HLD, diabetes mellitus, chronic diastolic CHF, hypothyroidism, GERD, Jehovah's Witness, anemia brought to the ED after a fall.  Patient is very difficult to care, was trying to ambulate with her walker slipped and fell onto her bottom. She was seen in the ED work-up showed nondisplaced fracture of the greater trochanter of the left proximal femur.  Orthopedic was consulted advised nonsurgical intervention PT OT pain control skilled nursing facility.  Patient was admitted.  Patient has been agitated in the ED. Patient admitted and followed by PT OT recommended skilled nursing facility and daughter is open to this at this time. On the last discharge 5/17 after compression fracture family declined SNF and was taken home with 24/7 caregiver. Patient was managed, she has had episodes of agitation suspecting in the setting of dementia.  Seen by psychiatry palliative care.  She was also started on Seroquel bedtime and seems to be responding well. Patient had issue with urine retention Foley was placed and removed and doing voiding trial-but again needing multiple in and out catheterization.  Needed Foley to be reinserted 5/25. At this time she has been accepted at hospice in her own house, equipment to be delivered and she  is ready for discharge today.  Discharge Diagnoses:  Greater intertrochanteric fracture of the left femur mildly displaced: Per orthopedic continue pain control PT OT and nonsurgical management.Awaiting placement to home with hospice services  Agitation in the setting of dementia with behavioral disturbance Advanced dementia with agitation behavioral disturbance: Appreciate psychiatric input.Patient having delirium due to dementia-started on Seroquel 25 mg bedtime-she seems to have responded very well.  She is in fact alert awake oriented to self, hospital.  Continue the same appreciate psychiatry input- Can increase Seroquel to twice daily if needed, stopped prn  Zyprexa.Continue supportive measures fall precaution.she is much more alert awake, comfortable not agitated and still for discharge to home with hospice.  She will continue  Aricept, Depakote .  Urine retention needing Foley catheter 5/21. started on Flomax.  off Foley catheter but needing in and out cath and Foley reinserted 5/25.  Keep the Foley for now.  Can follow-up with urology or primary as outpatient PRN or change by hospice RN.  Hypokalemia resolved.  Hypothyroidism: Continue home Synthroid.  Recent TSH elevated however was nonadherent to Synthroid at that time follow-up with 4 to 6 weeks lab.  DM type 2, recent hemoglobin A1c 9.3, blood sugar stable after decreasing insulin continue 10 units Levemir and sliding scale insulin.  Discontinued metformin/Amaryl Recent Labs  Lab 09/06/20 0622 09/06/20 1115 09/06/20 1703 09/06/20 1959 09/07/20 0638  GLUCAP 178* 198* 182* 298* 167*   Chronic diastolic CHF: Compensated.  CAD/HLD/hypertension: BP is soft 5/25 morning stopped losartan, continue low-dose Coreg.Continue her Plavix, Zetia  Lipitor.  Adrenal insufficiency: on po  hydrocortisone  Goals of care: Palliative care input appreciated after family meeting discussion DNR MOST form completed.  After further discussion and  family meeting plan is for discharge home with hospice care as per [facility].  Was waiting for hospice to be set up on Friday and is ready today. Consults:  Palliative care, psychiatry  Subjective: She is alert awake comfortable, not agitated.  With baseline dementia. Discharge Exam: Vitals:   09/07/20 0322 09/07/20 0733  BP: (!) 136/59 (!) 150/76  Pulse: 81 79  Resp: 16 17  Temp: 97.8 F (36.6 C) 98.1 F (36.7 C)  SpO2: 98% 100%   General: Pt is alert, awake, not in acute distress Cardiovascular: RRR, S1/S2 +, no rubs, no gallops Respiratory: CTA bilaterally, no wheezing, no rhonchi Abdominal: Soft, NT, ND, bowel sounds + Extremities: no edema, no cyanosis  Discharge Instructions   Allergies as of 09/07/2020      Reactions   Tetanus Toxoid, Adsorbed Swelling, Rash   Site of injection swells      Medication List    STOP taking these medications   furosemide 20 MG tablet Commonly known as: LASIX   glimepiride 4 MG tablet Commonly known as: AMARYL   losartan 50 MG tablet Commonly known as: COZAAR   metFORMIN 500 MG 24 hr tablet Commonly known as: GLUCOPHAGE-XR   methocarbamol 500 MG tablet Commonly known as: ROBAXIN     TAKE these medications   atorvastatin 80 MG tablet Commonly known as: LIPITOR TAKE 1 TABLET BY MOUTH ONCE DAILY. What changed: when to take this   carvedilol 3.125 MG tablet Commonly known as: COREG TAKE 1 TABLET BY MOUTH TWICE DAILY What changed: when to take this   clopidogrel 75 MG tablet Commonly known as: PLAVIX TAKE 1 TABLET BY MOUTH ONCE DAILY. What changed: when to take this   divalproex 250 MG 24 hr tablet Commonly known as: DEPAKOTE ER Take 250 mg by mouth every evening.   donepezil 10 MG tablet Commonly known as: ARICEPT Take 10 mg by mouth every evening.   escitalopram 20 MG tablet Commonly known as: LEXAPRO Take 1 tablet (20 mg total) by mouth every evening.   hydrocortisone 5 MG tablet Commonly known as: CORTEF Take  15 mg by mouth every evening.   hydrOXYzine 25 MG tablet Commonly known as: ATARAX/VISTARIL Take 12.5 mg by mouth every 8 (eight) hours as needed for anxiety.   Levemir FlexTouch 100 UNIT/ML FlexPen Generic drug: insulin detemir Inject 10 Units into the skin every evening. What changed: how much to take   levothyroxine 125 MCG tablet Commonly known as: SYNTHROID Take 125 mcg by mouth daily before breakfast.   meclizine 25 MG tablet Commonly known as: ANTIVERT Take 25 mg by mouth 4 (four) times daily as needed for dizziness.   nitroGLYCERIN 0.4 MG SL tablet Commonly known as: NITROSTAT Place 0.4 mg under the tongue every 5 (five) minutes as needed for chest pain. Reported on 10/04/2015   oxyCODONE 5 MG immediate release tablet Commonly known as: Oxy IR/ROXICODONE Take 1 tablet (5 mg total) by mouth every 6 (six) hours as needed for up to 10 days for moderate pain.   polyethylene glycol 17 g packet Commonly known as: MIRALAX / GLYCOLAX Take 17 g by mouth daily as needed for mild constipation. Reported on 10/04/2015   QUEtiapine 25 MG tablet Commonly known as: SEROQUEL Take 1 tablet (25 mg total) by mouth at bedtime.   tamsulosin 0.4 MG Caps capsule Commonly known as: FLOMAX  Take 1 capsule (0.4 mg total) by mouth daily.   TRUEplus Insulin Syringe 31G X 5/16" 0.5 ML Misc Generic drug: Insulin Syringe-Needle U-100   Zetia 10 MG tablet Generic drug: ezetimibe TAKE 1 TABLET BY MOUTH EVERY DAY       Contact information for follow-up providers    Mcarthur Rossetti, MD. Schedule an appointment as soon as possible for a visit in 2 week(s).   Specialty: Orthopedic Surgery Contact information: [place] [address] [place] Follow up.   Specialty: Home Health Services Contact information: [address] [address] [phone]            Contact information for after-discharge care     Destination    [facility] Preferred SNF .   Service: Skilled Nursing Contact information: [address] [address] [phone]                 Allergies  Allergen Reactions  . Tetanus Toxoid, Adsorbed Swelling and Rash    Site of injection swells    The results of significant diagnostics from this hospitalization (including imaging, microbiology, ancillary and laboratory) are listed below for reference.    Microbiology: Recent Results (from the past 240 hour(s))  Resp Panel by RT-PCR (Flu A&B, Covid) Nasopharyngeal Swab     Status: None   Collection Time: 08/31/20  8:04 AM   Specimen: Nasopharyngeal Swab; Nasopharyngeal(NP) swabs in vial transport medium  Result Value Ref Range Status   SARS Coronavirus 2 by RT PCR NEGATIVE NEGATIVE Final    Comment: (NOTE) SARS-CoV-2 target nucleic acids are NOT DETECTED.  The SARS-CoV-2 RNA is generally detectable in upper respiratory specimens during the acute phase of infection. The lowest concentration of SARS-CoV-2 viral copies this assay can detect is 138 copies/mL. A negative result does not preclude SARS-Cov-2 infection and should not be used as the sole basis for treatment or other patient management decisions. A negative result may occur with  improper specimen collection/handling, submission of specimen other than nasopharyngeal swab, presence of viral mutation(s) within the areas targeted by this assay, and inadequate number of viral copies(<138 copies/mL). A negative result must be combined with clinical observations, patient history, and epidemiological information. The expected result is Negative.  Fact Sheet for Patients:  EntrepreneurPulse.com.au  Fact Sheet for Healthcare Providers:  IncredibleEmployment.be  This test is no t yet approved or cleared by the [place] FDA and  has been authorized for detection and/or diagnosis of  SARS-CoV-2 by FDA under an Emergency Use Authorization (EUA). This EUA will remain  in effect (meaning this test can be used) for the duration of the COVID-19 declaration under Section 564(b)(1) of the Act, 21 U.S.C.section 360bbb-3(b)(1), unless the authorization is terminated  or revoked sooner.       Influenza A by PCR NEGATIVE NEGATIVE Final   Influenza B by PCR NEGATIVE NEGATIVE Final    Comment: (NOTE) The Xpert Xpress SARS-CoV-2/FLU/RSV plus assay is intended as an aid in the diagnosis of influenza from Nasopharyngeal swab specimens and should not be used as a sole basis for treatment. Nasal washings and aspirates are unacceptable for Xpert Xpress SARS-CoV-2/FLU/RSV testing.  Fact Sheet for Patients: EntrepreneurPulse.com.au  Fact Sheet for Healthcare Providers: IncredibleEmployment.be  This test is not yet approved or cleared by the [place] FDA and has been authorized for detection and/or diagnosis of SARS-CoV-2 by FDA under an Emergency  Use Authorization (EUA). This EUA will remain in effect (meaning this test can be used) for the duration of the COVID-19 declaration under Section 564(b)(1) of the Act, 21 U.S.C. section 360bbb-3(b)(1), unless the authorization is terminated or revoked.  Performed at [facility], [place] [address], [place], [place] [address]     Procedures/Studies: DG Thoracic Spine W/Swimmers  Result Date: 08/08/2020 CLINICAL DATA:  Trauma with back pain. Pain after fall. EXAM: THORACIC SPINE - 3 VIEWS COMPARISON:  None. FINDINGS: Mild levo scoliotic curvature of the upper lumbar spine. Bones are diffusely under mineralized. Question of segmentation anomaly involving T4-T5. Multilevel endplate spurring. No evidence of acute fracture no paravertebral soft tissue abnormality to suggest fracture. IMPRESSION: 1. No fracture or subluxation of the thoracic spine. 2. Question of segmentation anomaly involving  T4-T5. 3. Osteopenia/osteoporosis. Electronically Signed   By: Keith Rake M.D.   On: 08/08/2020 22:45   DG Lumbar Spine Complete  Result Date: 08/30/2020 CLINICAL DATA:  Recent fall with low back pain, initial encounter EXAM: LUMBAR SPINE - COMPLETE 4+ VIEW COMPARISON:  08/22/2020, 08/08/2020 FINDINGS: L1 compression deformity is noted progressed when compared with the prior CT. No new compression deformity is seen. No pars defects or anterolisthesis is seen. Mild increased kyphosis at the L1 level is noted secondary to the fracture. IMPRESSION: Mild progress in L1 compression deformity when compared with recent CT examination. Electronically Signed   By: Inez Catalina M.D.   On: 08/30/2020 19:43   DG Lumbar Spine Complete  Result Date: 08/08/2020 CLINICAL DATA:  Trauma with back pain.  Pain after fall. EXAM: LUMBAR SPINE - COMPLETE 4+ VIEW COMPARISON:  None. FINDINGS: Bones are diffusely under mineralized. Trace anterolisthesis of L5 on S1. Alignment is otherwise normal. No acute fracture. Vertebral body heights are preserved. S1 appears partially lumbarized. Facet hypertrophy at L5-S1. Disc spaces are preserved. Sacroiliac joints are congruent. IMPRESSION: 1. No fracture of the lumbar spine. 2. Mild degenerative change with facet hypertrophy at L5-S1. Electronically Signed   By: Keith Rake M.D.   On: 08/08/2020 22:43   CT Hip Left Wo Contrast  Result Date: 08/30/2020 CLINICAL DATA:  Left hip pain. EXAM: CT OF THE LEFT HIP WITHOUT CONTRAST TECHNIQUE: Multidetector CT imaging of the left hip was performed according to the standard protocol. Multiplanar CT image reconstructions were also generated. COMPARISON:  Radiographs of same day. FINDINGS: Mildly displaced fracture is seen involving the greater trochanter of the proximal left femur. This appears to be in a nonweightbearing area of the bone. No other fracture or dislocation is noted. No significant degenerative changes seen involving the  left hip joint. No significant soft tissue abnormality is noted. IMPRESSION: Mildly displaced fracture is seen involving the greater trochanter of the proximal left femur. Electronically Signed   By: Marijo Conception M.D.   On: 08/30/2020 21:00   CT L-SPINE NO CHARGE  Result Date: 08/22/2020 CLINICAL DATA:  Back pain for 2 weeks after fall in April. EXAM: CT LUMBAR SPINE WITHOUT CONTRAST TECHNIQUE: Multidetector CT imaging of the lumbar spine was performed without intravenous contrast administration. Multiplanar CT image reconstructions were also generated. COMPARISON:  Lumbar radiograph 08/08/2020 FINDINGS: Segmentation: 5 lumbar type vertebrae. Alignment: No listhesis. Vertebrae: Mild L1 superior endplate compression fracture likely subacute. There is approximately 30% loss of height centrally. Mild buckling of the posterior cortex without significant mass effect on the spinal canal. No involvement of the posterior elements. No additional fracture. The remaining vertebral body heights are preserved.  No sacral fracture. Paraspinal and other soft tissues: No significant paraspinal hemorrhage. Aortic and branch atherosclerosis. Abdominal structures assessed on concurrent abdominopelvic CT, reported separately. Disc levels: Mild T12-L1 disc space narrowing and vacuum phenomenon. Disc spaces are preserved. No significant mass effect on the spinal canal related to L1 fracture. There is no canal narrowing. Facet hypertrophy at L5-S1. IMPRESSION: Mild L1 superior endplate compression fracture with approximately 30% loss of height centrally. Mild buckling of the posterior cortex without significant mass effect on the spinal canal. Findings are likely subacute. Electronically Signed   By: Keith Rake M.D.   On: 08/22/2020 16:37   CT Renal Stone Study  Result Date: 08/22/2020 CLINICAL DATA:  Back pain for several weeks following recent fall with hematuria, initial encounter EXAM: CT ABDOMEN AND PELVIS WITHOUT  CONTRAST TECHNIQUE: Multidetector CT imaging of the abdomen and pelvis was performed following the standard protocol without IV contrast. COMPARISON:  08/08/2020 FINDINGS: Lower chest: No acute abnormality. Hepatobiliary: Liver is within normal limits. Gallbladder is well distended with dependent gallstones. No ductal dilatation is seen. Pancreas: Unremarkable. No pancreatic ductal dilatation or surrounding inflammatory changes. Spleen: Normal in size without focal abnormality. Adrenals/Urinary Tract: Adrenal glands are within normal limits. Kidneys show vague hypodensities likely representing small cysts. No renal calculi or obstructive changes are noted. The ureters are within normal limits to the level of the urinary bladder. Bladder is well distended. Stomach/Bowel: Scattered diverticular changes noted without evidence of diverticulitis. Scattered fecal material is noted throughout the colon. No obstructive or inflammatory changes are seen. The appendix is within normal limits. No inflammatory changes are seen. Small bowel and stomach are unremarkable. Vascular/Lymphatic: Aortic atherosclerosis. No enlarged abdominal or pelvic lymph nodes. Reproductive: Status post hysterectomy. No adnexal masses. Other: No abdominal wall hernia or abnormality. No abdominopelvic ascites. Musculoskeletal: Degenerative changes of lumbar spine are noted. Partial lumbarization of S1 is seen. There is a superior endplate compression deformity at L1 identified which was not seen on the recent plain film examination and may have been related to the recent fall. No significant retropulsion is noted. No other focal bony abnormality is noted. IMPRESSION: L1 compression fracture not present on prior plain film examination and likely related to the recent injury. Diverticular change without diverticulitis. Cholelithiasis without complicating factors. No other focal abnormality is noted. Electronically Signed   By: Inez Catalina M.D.   On:  08/22/2020 16:34   DG HIP UNILAT WITH PELVIS 2-3 VIEWS LEFT  Result Date: 08/30/2020 CLINICAL DATA:  Status post fall. EXAM: DG HIP (WITH OR WITHOUT PELVIS) 2-3V LEFT COMPARISON:  None. FINDINGS: In ill-defined cortical lucency is seen along the greater trochanter of the proximal left femur. There is no evidence of dislocation. Mild degenerative changes are seen within the form of joint space narrowing and acetabular sclerosis. IMPRESSION: Ill-defined cortical lucency along the greater trochanter which is of indeterminate age. Evaluation with physical exam is recommended to determine the presence of point tenderness within this region. CT correlation is recommended if acute fracture remains of clinical concern. Electronically Signed   By: Virgina Norfolk M.D.   On: 08/30/2020 19:44   DG HIP UNILAT WITH PELVIS 2-3 VIEWS RIGHT  Result Date: 08/30/2020 CLINICAL DATA:  Right hip pain following fall, initial encounter EXAM: DG HIP (WITH OR WITHOUT PELVIS) 3V RIGHT COMPARISON:  None. FINDINGS: Pelvic ring is intact. Proximal right femur appears within normal limits. No definitive fracture or dislocation is seen. No soft tissue abnormality is noted. IMPRESSION: No acute abnormality  noted. Electronically Signed   By: Inez Catalina M.D.   On: 08/30/2020 19:41    Labs: BNP (last 3 results) No results for input(s): BNP in the last 8760 hours. Basic Metabolic Panel: Recent Labs  Lab 09/01/20 0410  NA 137  K 3.8  CL 106  CO2 24  GLUCOSE 206*  BUN 10  CREATININE 0.72  CALCIUM 8.8*   Liver Function Tests: No results for input(s): AST, ALT, ALKPHOS, BILITOT, PROT, ALBUMIN in the last 168 hours. No results for input(s): LIPASE, AMYLASE in the last 168 hours. No results for input(s): AMMONIA in the last 168 hours. CBC: Recent Labs  Lab 09/01/20 0410  WBC 9.9  HGB 13.1  HCT 39.9  MCV 87.5  PLT 334   Cardiac Enzymes: No results for input(s): CKTOTAL, CKMB, CKMBINDEX, TROPONINI in the last 168  hours. BNP: Invalid input(s): POCBNP CBG: Recent Labs  Lab 09/06/20 0622 09/06/20 1115 09/06/20 1703 09/06/20 1959 09/07/20 0638  GLUCAP 178* 198* 182* 298* 167*   D-Dimer No results for input(s): DDIMER in the last 72 hours. Hgb A1c No results for input(s): HGBA1C in the last 72 hours. Lipid Profile No results for input(s): CHOL, HDL, LDLCALC, TRIG, CHOLHDL, LDLDIRECT in the last 72 hours. Thyroid function studies No results for input(s): TSH, T4TOTAL, T3FREE, THYROIDAB in the last 72 hours.  Invalid input(s): FREET3 Anemia work up No results for input(s): VITAMINB12, FOLATE, FERRITIN, TIBC, IRON, RETICCTPCT in the last 72 hours. Urinalysis    Component Value Date/Time   COLORURINE YELLOW 09/01/2020 1936   APPEARANCEUR HAZY (A) 09/01/2020 1936   LABSPEC 1.018 09/01/2020 1936   PHURINE 5.0 09/01/2020 1936   GLUCOSEU NEGATIVE 09/01/2020 [place] NEGATIVE 09/01/2020 [place] NEGATIVE 09/01/2020 1936   KETONESUR 5 (A) 09/01/2020 1936   PROTEINUR 30 (A) 09/01/2020 1936   NITRITE NEGATIVE 09/01/2020 1936   LEUKOCYTESUR NEGATIVE 09/01/2020 1936   Sepsis Labs Invalid input(s): PROCALCITONIN,  WBC,  LACTICIDVEN Microbiology Recent Results (from the past 240 hour(s))  Resp Panel by RT-PCR (Flu A&B, Covid) Nasopharyngeal Swab     Status: None   Collection Time: 08/31/20  8:04 AM   Specimen: Nasopharyngeal Swab; Nasopharyngeal(NP) swabs in vial transport medium  Result Value Ref Range Status   SARS Coronavirus 2 by RT PCR NEGATIVE NEGATIVE Final    Comment: (NOTE) SARS-CoV-2 target nucleic acids are NOT DETECTED.  The SARS-CoV-2 RNA is generally detectable in upper respiratory specimens during the acute phase of infection. The lowest concentration of SARS-CoV-2 viral copies this assay can detect is 138 copies/mL. A negative result does not preclude SARS-Cov-2 infection and should not be used as the sole basis for treatment or other patient management  decisions. A negative result may occur with  improper specimen collection/handling, submission of specimen other than nasopharyngeal swab, presence of viral mutation(s) within the areas targeted by this assay, and inadequate number of viral copies(<138 copies/mL). A negative result must be combined with clinical observations, patient history, and epidemiological information. The expected result is Negative.  Fact Sheet for Patients:  EntrepreneurPulse.com.au  Fact Sheet for Healthcare Providers:  IncredibleEmployment.be  This test is no t yet approved or cleared by the [place] FDA and  has been authorized for detection and/or diagnosis of SARS-CoV-2 by FDA under an Emergency Use Authorization (EUA). This EUA will remain  in effect (meaning this test can be used) for the duration of the COVID-19 declaration under Section 564(b)(1) of the Act, 21 U.S.C.section 360bbb-3(b)(1),  unless the authorization is terminated  or revoked sooner.       Influenza A by PCR NEGATIVE NEGATIVE Final   Influenza B by PCR NEGATIVE NEGATIVE Final    Comment: (NOTE) The Xpert Xpress SARS-CoV-2/FLU/RSV plus assay is intended as an aid in the diagnosis of influenza from Nasopharyngeal swab specimens and should not be used as a sole basis for treatment. Nasal washings and aspirates are unacceptable for Xpert Xpress SARS-CoV-2/FLU/RSV testing.  Fact Sheet for Patients: EntrepreneurPulse.com.au  Fact Sheet for Healthcare Providers: IncredibleEmployment.be  This test is not yet approved or cleared by the [place] FDA and has been authorized for detection and/or diagnosis of SARS-CoV-2 by FDA under an Emergency Use Authorization (EUA). This EUA will remain in effect (meaning this test can be used) for the duration of the COVID-19 declaration under Section 564(b)(1) of the Act, 21 U.S.C. section 360bbb-3(b)(1), unless the  authorization is terminated or revoked.  Performed at [facility], [place] [address], [place], [place] [address]      Time coordinating discharge: 35 minutes  SIGNED: Antonieta Pert, MD  [facility] 09/07/2020, 8:59 AM  If 7PM-7AM, please contact night-coverage [email or web address]

## 2020-09-07 NOTE — Plan of Care (Signed)

## 2020-09-07 NOTE — Progress Notes (Signed)
   Equipment was delivered yesterday to home and the pt's daughter [name] is aware that pt is ready for d/c today and accepting to take the pt home. Hospice is prepared to be in home around 200pm to enroll into services. Webb Silversmith RN [phone]

## 2020-09-19 ENCOUNTER — Encounter (HOSPITAL_COMMUNITY): Payer: PPO

## 2020-09-19 ENCOUNTER — Ambulatory Visit: Payer: PPO | Admitting: Vascular Surgery

## 2020-11-12 DEATH — deceased

## 2022-01-23 IMAGING — CR DG LUMBAR SPINE COMPLETE 4+V
5 series · 5 of 5 positions shown · non-contrast
Comparison: 08/22/2020, 08/08/2020

CLINICAL DATA: Recent fall with low back pain, initial encounter

EXAM:
LUMBAR SPINE - COMPLETE 4+ VIEW

[l-spine ap]
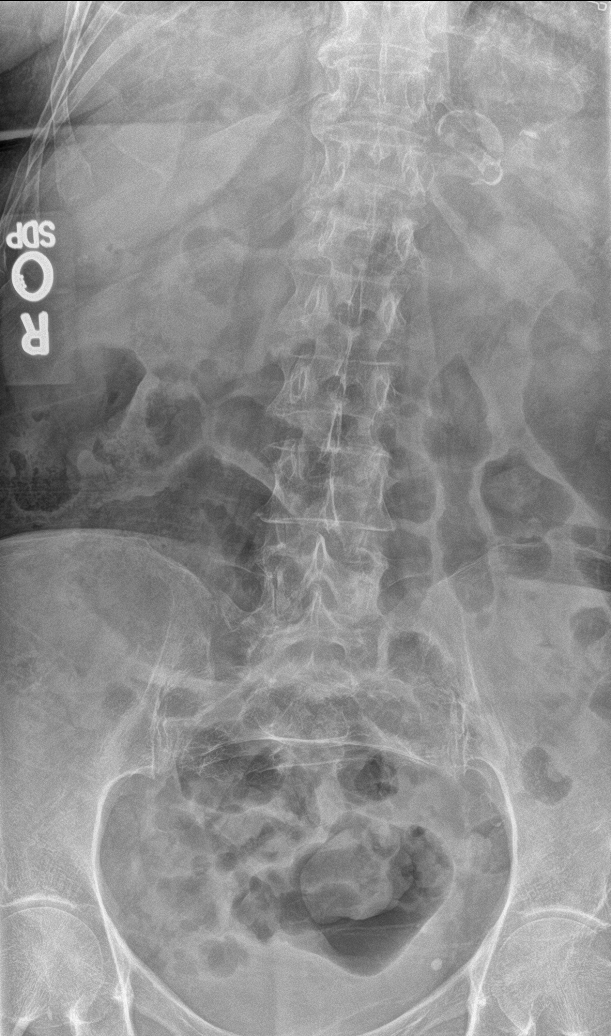

[l-spine obl (1 of 2)]
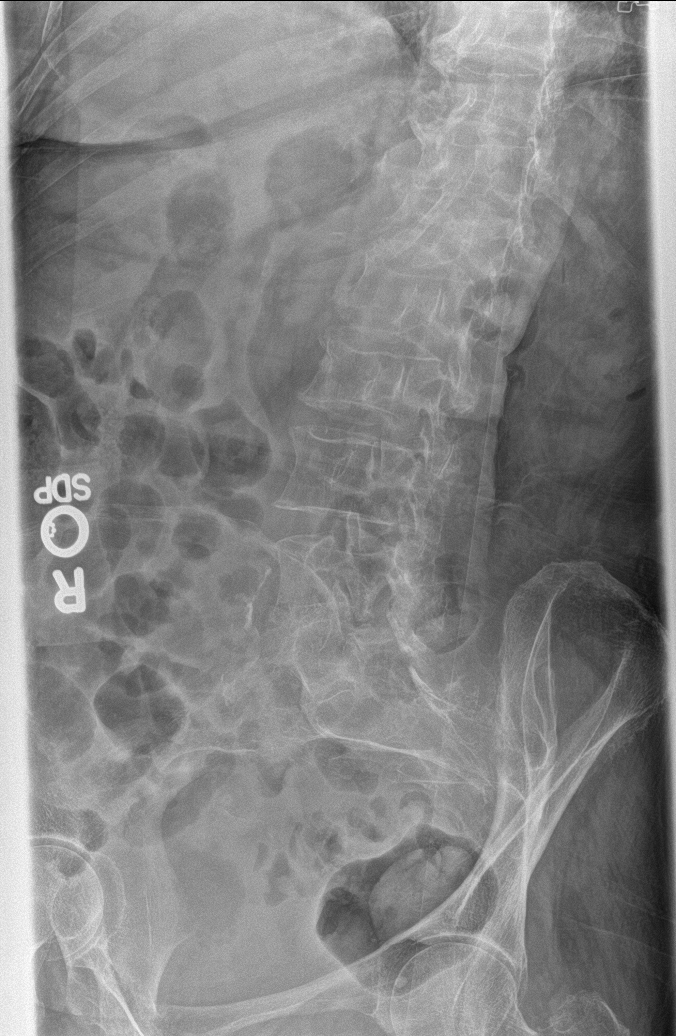

[l-spine obl (2 of 2)]
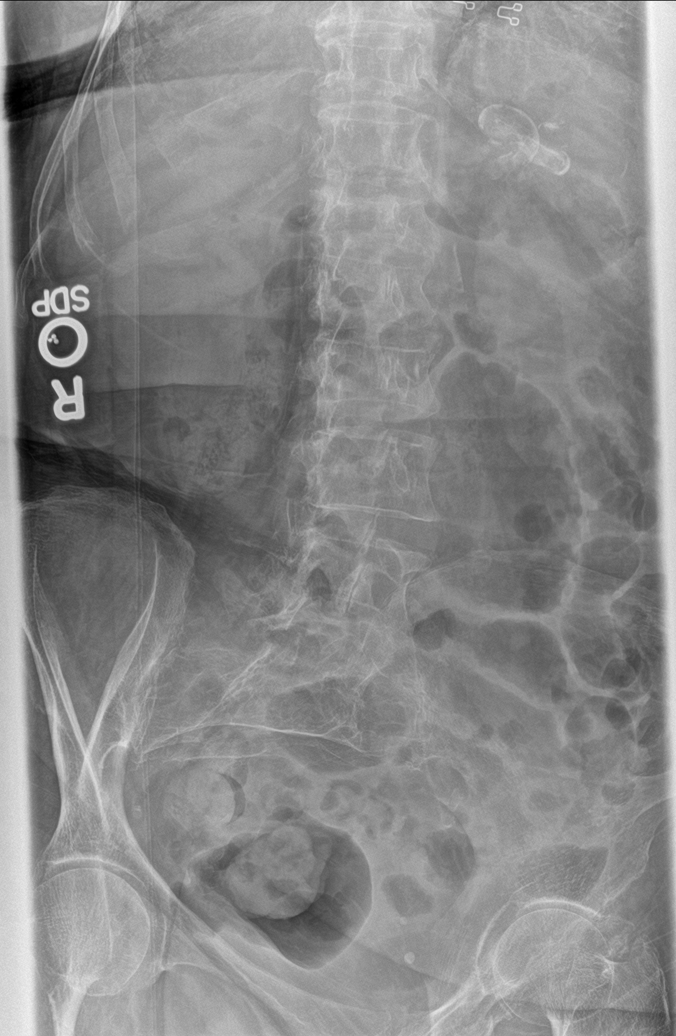

[l-spine lat]
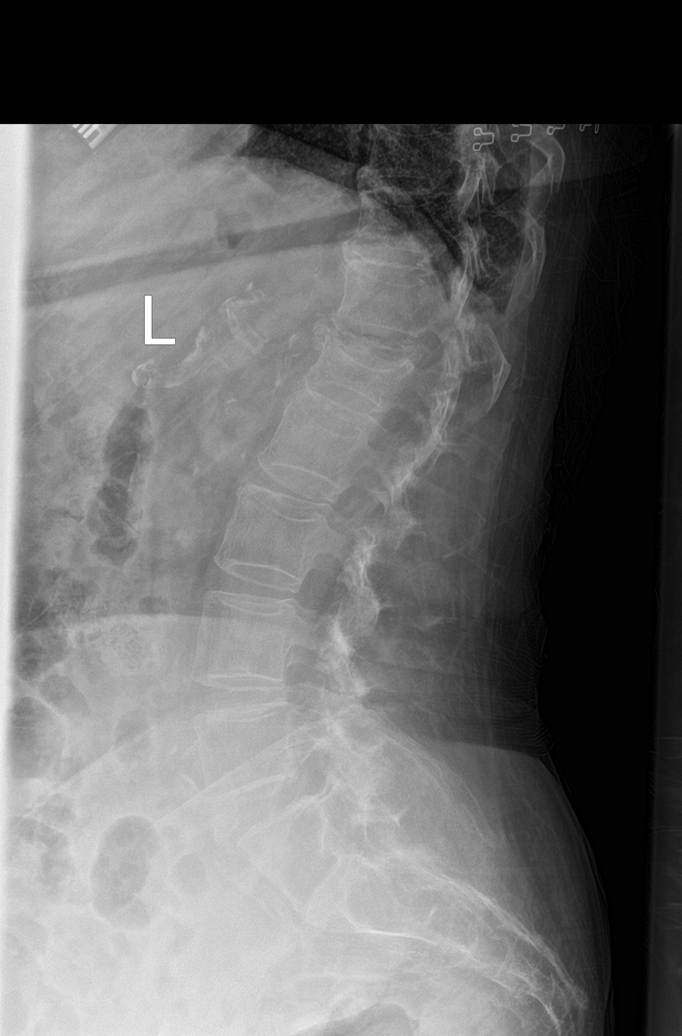

[l-spine spot]
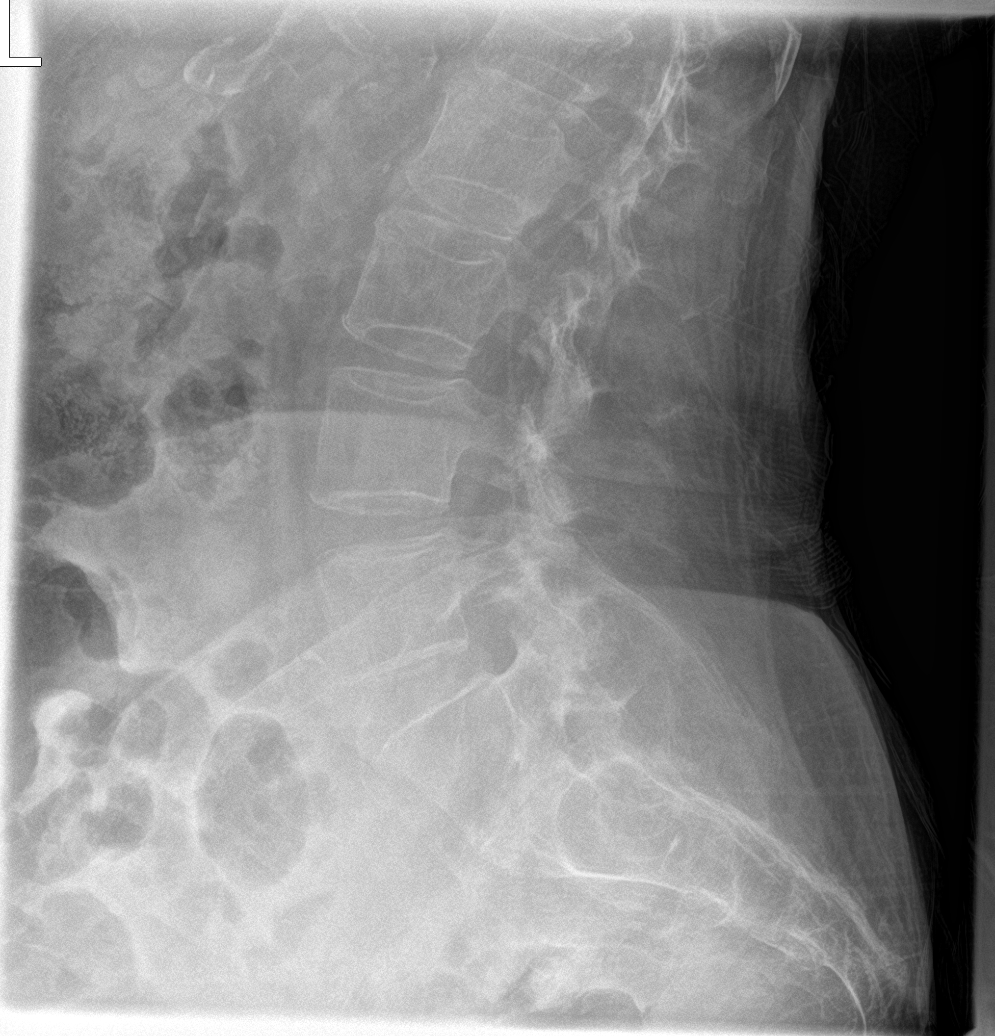

[5 of 5 positions shown; findings below may reference images not displayed]

FINDINGS: L1 compression deformity is noted progressed when compared with the
prior CT. No new compression deformity is seen. No pars defects or
anterolisthesis is seen. Mild increased kyphosis at the L1 level is
noted secondary to the fracture.
IMPRESSION: Mild progress in L1 compression deformity when compared with recent
CT examination.

## 2022-10-08 NOTE — Progress Notes (Signed)
Never seen - error.
